# Patient Record
Sex: Male | Born: 1945 | Race: White | Hispanic: No | Marital: Married | Smoking: Former smoker
Health system: Southern US, Community
[De-identification: ages and names within clinical notes are randomized; demographics above are authoritative.]

## PROBLEM LIST (undated history)

## (undated) DIAGNOSIS — I739 Peripheral vascular disease, unspecified: Secondary | ICD-10-CM

## (undated) DIAGNOSIS — E119 Type 2 diabetes mellitus without complications: Secondary | ICD-10-CM

## (undated) DIAGNOSIS — C61 Malignant neoplasm of prostate: Secondary | ICD-10-CM

## (undated) DIAGNOSIS — I4891 Unspecified atrial fibrillation: Secondary | ICD-10-CM

## (undated) DIAGNOSIS — K1379 Other lesions of oral mucosa: Secondary | ICD-10-CM

## (undated) HISTORY — DX: Unspecified atrial fibrillation: I48.91

## (undated) HISTORY — DX: Type 2 diabetes mellitus without complications: E11.9

## (undated) HISTORY — DX: Malignant neoplasm of prostate: C61

## (undated) HISTORY — PX: CORONARY ANGIOPLASTY WITH STENT PLACEMENT: SHX49

## (undated) HISTORY — DX: Peripheral vascular disease, unspecified: I73.9

---
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
## 2003-02-21 ENCOUNTER — Encounter: Admission: RE | Admit: 2003-02-21 | Discharge: 2003-02-21 | Payer: Self-pay | Admitting: Internal Medicine

## 2003-03-01 ENCOUNTER — Encounter: Admission: RE | Admit: 2003-03-01 | Discharge: 2003-03-01 | Payer: Self-pay | Admitting: Internal Medicine

## 2004-01-27 ENCOUNTER — Emergency Department (HOSPITAL_COMMUNITY): Admission: EM | Admit: 2004-01-27 | Discharge: 2004-01-27 | Payer: Self-pay | Admitting: *Deleted

## 2010-05-12 ENCOUNTER — Encounter: Payer: Self-pay | Admitting: Internal Medicine

## 2010-07-09 ENCOUNTER — Other Ambulatory Visit: Payer: Self-pay | Admitting: Internal Medicine

## 2010-07-10 ENCOUNTER — Ambulatory Visit
Admission: RE | Admit: 2010-07-10 | Discharge: 2010-07-10 | Disposition: A | Discharge status: Home / Self Care | Payer: Medicare Other | Source: Ambulatory Visit | Attending: Internal Medicine | Admitting: Internal Medicine

## 2010-08-08 ENCOUNTER — Other Ambulatory Visit: Payer: Self-pay | Admitting: Otolaryngology

## 2010-08-08 DIAGNOSIS — E041 Nontoxic single thyroid nodule: Secondary | ICD-10-CM

## 2010-08-28 ENCOUNTER — Ambulatory Visit
Admission: RE | Admit: 2010-08-28 | Discharge: 2010-08-28 | Disposition: A | Discharge status: Home / Self Care | Payer: Medicare Other | Source: Ambulatory Visit | Attending: Otolaryngology | Admitting: Otolaryngology

## 2010-08-28 ENCOUNTER — Other Ambulatory Visit (HOSPITAL_COMMUNITY)
Admission: RE | Admit: 2010-08-28 | Discharge: 2010-08-28 | Disposition: A | Discharge status: Home / Self Care | Payer: Medicare Other | Source: Ambulatory Visit | Attending: Interventional Radiology | Admitting: Interventional Radiology

## 2010-08-28 ENCOUNTER — Other Ambulatory Visit: Payer: Self-pay | Admitting: Interventional Radiology

## 2010-08-28 DIAGNOSIS — E049 Nontoxic goiter, unspecified: Secondary | ICD-10-CM | POA: Insufficient documentation

## 2010-08-28 DIAGNOSIS — E041 Nontoxic single thyroid nodule: Secondary | ICD-10-CM

## 2010-09-26 ENCOUNTER — Ambulatory Visit (HOSPITAL_COMMUNITY)
Admission: RE | Admit: 2010-09-26 | Discharge: 2010-09-26 | Disposition: A | Discharge status: Home / Self Care | Payer: Medicare Other | Source: Ambulatory Visit | Attending: Otolaryngology | Admitting: Otolaryngology

## 2010-09-26 ENCOUNTER — Encounter (HOSPITAL_COMMUNITY)
Admission: RE | Admit: 2010-09-26 | Discharge: 2010-09-26 | Disposition: A | Discharge status: Home / Self Care | Payer: Medicare Other | Source: Ambulatory Visit | Attending: Otolaryngology | Admitting: Otolaryngology

## 2010-09-26 ENCOUNTER — Other Ambulatory Visit (HOSPITAL_COMMUNITY): Payer: Self-pay | Admitting: Otolaryngology

## 2010-09-26 DIAGNOSIS — Z01812 Encounter for preprocedural laboratory examination: Secondary | ICD-10-CM | POA: Insufficient documentation

## 2010-09-26 DIAGNOSIS — Z01810 Encounter for preprocedural cardiovascular examination: Secondary | ICD-10-CM | POA: Insufficient documentation

## 2010-09-26 DIAGNOSIS — E049 Nontoxic goiter, unspecified: Secondary | ICD-10-CM | POA: Insufficient documentation

## 2010-09-26 DIAGNOSIS — I10 Essential (primary) hypertension: Secondary | ICD-10-CM | POA: Insufficient documentation

## 2010-09-26 DIAGNOSIS — E041 Nontoxic single thyroid nodule: Secondary | ICD-10-CM

## 2010-09-26 DIAGNOSIS — Z01818 Encounter for other preprocedural examination: Secondary | ICD-10-CM | POA: Insufficient documentation

## 2010-09-26 LAB — CBC
HCT: 43 % (ref 39.0–52.0)
Hemoglobin: 14.5 g/dL (ref 13.0–17.0)
MCH: 29.9 pg (ref 26.0–34.0)
MCHC: 33.7 g/dL (ref 30.0–36.0)
MCV: 88.7 fL (ref 78.0–100.0)
Platelets: 382 10*3/uL (ref 150–400)
RBC: 4.85 MIL/uL (ref 4.22–5.81)
RDW: 12.8 % (ref 11.5–15.5)
WBC: 7.5 10*3/uL (ref 4.0–10.5)

## 2010-09-26 LAB — BASIC METABOLIC PANEL
BUN: 34 mg/dL — ABNORMAL HIGH (ref 6–23)
CO2: 33 mEq/L — ABNORMAL HIGH (ref 19–32)
Calcium: 9.9 mg/dL (ref 8.4–10.5)
Chloride: 104 mEq/L (ref 96–112)
Creatinine, Ser: 0.92 mg/dL (ref 0.4–1.5)
GFR calc Af Amer: >60 mL/min (ref >60)
GFR calc non Af Amer: >60 mL/min (ref >60)
Glucose, Bld: 102 mg/dL — ABNORMAL HIGH (ref 70–99)
Potassium: 5.3 mEq/L — ABNORMAL HIGH (ref 3.5–5.1)
Sodium: 145 mEq/L (ref 135–145)

## 2010-09-26 LAB — SURGICAL PCR SCREEN
MRSA, PCR: NEGATIVE
Staphylococcus aureus: NEGATIVE

## 2010-09-27 ENCOUNTER — Other Ambulatory Visit (HOSPITAL_COMMUNITY): Payer: Medicare Other

## 2010-09-28 ENCOUNTER — Ambulatory Visit (HOSPITAL_COMMUNITY)
Admission: RE | Admit: 2010-09-28 | Discharge: 2010-09-29 | Disposition: A | Discharge status: Home / Self Care | Payer: Medicare Other | Source: Ambulatory Visit | Attending: Otolaryngology | Admitting: Otolaryngology

## 2010-09-28 ENCOUNTER — Other Ambulatory Visit: Payer: Self-pay | Admitting: Otolaryngology

## 2010-09-28 DIAGNOSIS — Z01818 Encounter for other preprocedural examination: Secondary | ICD-10-CM | POA: Insufficient documentation

## 2010-09-28 DIAGNOSIS — E669 Obesity, unspecified: Secondary | ICD-10-CM | POA: Insufficient documentation

## 2010-09-28 DIAGNOSIS — Z01812 Encounter for preprocedural laboratory examination: Secondary | ICD-10-CM | POA: Insufficient documentation

## 2010-09-28 DIAGNOSIS — Z01810 Encounter for preprocedural cardiovascular examination: Secondary | ICD-10-CM | POA: Insufficient documentation

## 2010-09-28 DIAGNOSIS — D34 Benign neoplasm of thyroid gland: Secondary | ICD-10-CM | POA: Insufficient documentation

## 2010-09-28 DIAGNOSIS — I10 Essential (primary) hypertension: Secondary | ICD-10-CM | POA: Insufficient documentation

## 2010-09-28 DIAGNOSIS — G4733 Obstructive sleep apnea (adult) (pediatric): Secondary | ICD-10-CM | POA: Insufficient documentation

## 2010-09-28 DIAGNOSIS — K219 Gastro-esophageal reflux disease without esophagitis: Secondary | ICD-10-CM | POA: Insufficient documentation

## 2010-11-13 NOTE — Op Note (Signed)
NAMEGIL, INGWERSEN                ACCOUNT NO.:  [id number]  MEDICAL RECORD NO.:  [id number]  LOCATION:  5150                         FACILITY:  [facility]  PHYSICIAN:  Kinnie Scales. Annalee Genta, M.D.DATE OF BIRTH:  29-Oct-1945  DATE OF PROCEDURE:  09/28/2010 DATE OF DISCHARGE:                              OPERATIVE REPORT   LOCATION:  [facility] Main OR.  PREOPERATIVE DIAGNOSIS:  Left thyroid mass.  POSTOPERATIVE DIAGNOSIS:  Left thyroid mass.  INDICATION FOR SURGERY:  Left thyroid mass.  SURGICAL PROCEDURES:  Left hemithyroidectomy.  SURGEON:  Kinnie Scales. Annalee Genta, MD  No complications.  ANESTHESIA:  General endotracheal.  Minimal blood loss.  The patient was transferred from the operating room to the recovery room in stable condition.  BRIEF HISTORY:  The patient is a 65 year old white male referred to our office with a history of an incidental finding of a left inferior thyroid mass.  The patient has a family history of thyroid cancer. 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  Given the history, examination findings, and the patient's concern, we discussed various treatment options including observation and repeat biopsies overtime versus possible hemithyroidectomy.  The patient opted to undergo surgical intervention and was scheduled for left hemithyroidectomy with possible total based on frozen section analysis.  The risks, benefits, and possible complications of the surgical procedure were discussed in detail with the patient and his family.  They understood and concurred to our plan for surgery which is scheduled under general anesthesia as an outpatient.  PROCEDURE:  The patient was brought to the operating room at [facility] OR on September 28, 2010, placed in supine position on the operating table.  General endotracheal anesthesia was established without difficulty and the patient was adequately  anesthetized.  He was positioned on the operating table, prepped and draped in a sterile fashion.  The patient's skin was then injected with 1% lidocaine 1:100,000 dilution epinephrine, a total of 3 mL were injected in the proposed skin incision in the anterior neck.  He was then prepped and positioned on the operating table and the surgical procedure was begun. A 4-cm horizontally oriented skin incision was created in the anterior neck skin, it was carried through the skin and underlying subcutaneous tissue, platysma.  Subplatysmal flaps were elevated superiorly and inferiorly.  Strap muscles were identified in the midline, divided, and lateralized allowing access to the anterior compartment of the neck. The thyroid gland was carefully palpated.  On the left-hand side, there was a diffuse nodular area in the inferior aspect of left thyroid lobe. The right side appeared smooth without palpable nodule.  The strap muscles were lateralized.  The fascia of the thyroid gland was dissected.  The middle thyroid vein was identified, divided, and suture ligated.  Dissection was then carried along the inferior aspect of the gland which was carefully elevated dividing the inferior vascular supply and preserving the inferior parathyroid gland.  Blood vessels were divided using both Harmonic scalpel and suture ligature.  The thyroid isthmus was then divided along the anterior aspect of the trachea.  The thyrotracheal ligament was then carefully dissected.  The recurrent laryngeal  nerve was identified on entering the cricothyroid region and was preserved throughout.  Small blood vessels were cauterized using the Harmonic scalpel and bipolar cautery along the course of the recurrent laryngeal nerve.  Superior pole of the left thyroid lobe was then carefully dissected.  Strap muscles were elevated laterally and the superior vessels were divided and suture ligated with 2-0 silk suture. The gland was then  removed completely and sent to pathology for frozen section analysis.  Frozen section showed follicular neoplasm.  No evidence of malignancy.  Final diagnosis deferred to final path report. The patient's wound was then thoroughly irrigated.  There was no bleeding.  A 7-French round drain was then placed to the depth of the incision and sutured to the skin with a 3-0 Ethilon suture.  The incision was then closed in multiple layers with reapproximation of the strap muscles consisting of 3-0 Vicryl suture in an interrupted fashion. Deep subcutaneous and platysma level were closed with 4-0 Vicryl suture and immediate subcutaneous tissue was closed with 4-0 Vicryl in an interrupted fashion.  Final wound edge was closed with surgical glue. The patient was then awakened from his anesthetic, he was extubated and was transferred from the operating room to the recovery room in stable condition.  No complications.  Blood loss minimal.          ______________________________ Kinnie Scales. Annalee Genta, M.D.     DLS/MEDQ  D:  11/91/4782  T:  09/29/2010  Job:  [id number]  Electronically Signed by Osborn Coho M.D. on 11/13/2010 12:49:25 PM

## 2011-05-03 DIAGNOSIS — J029 Acute pharyngitis, unspecified: Secondary | ICD-10-CM | POA: Diagnosis not present

## 2011-05-03 DIAGNOSIS — Z79899 Other long term (current) drug therapy: Secondary | ICD-10-CM | POA: Diagnosis not present

## 2011-05-03 DIAGNOSIS — Z8546 Personal history of malignant neoplasm of prostate: Secondary | ICD-10-CM | POA: Diagnosis not present

## 2011-05-03 DIAGNOSIS — R3915 Urgency of urination: Secondary | ICD-10-CM | POA: Diagnosis not present

## 2011-05-14 DIAGNOSIS — N318 Other neuromuscular dysfunction of bladder: Secondary | ICD-10-CM | POA: Diagnosis not present

## 2011-05-14 DIAGNOSIS — C61 Malignant neoplasm of prostate: Secondary | ICD-10-CM | POA: Diagnosis not present

## 2011-07-02 DIAGNOSIS — N318 Other neuromuscular dysfunction of bladder: Secondary | ICD-10-CM | POA: Diagnosis not present

## 2011-07-02 DIAGNOSIS — N529 Male erectile dysfunction, unspecified: Secondary | ICD-10-CM | POA: Diagnosis not present

## 2011-07-02 DIAGNOSIS — Z8546 Personal history of malignant neoplasm of prostate: Secondary | ICD-10-CM | POA: Diagnosis not present

## 2011-07-26 DIAGNOSIS — I10 Essential (primary) hypertension: Secondary | ICD-10-CM | POA: Diagnosis not present

## 2011-07-26 DIAGNOSIS — K5900 Constipation, unspecified: Secondary | ICD-10-CM | POA: Diagnosis not present

## 2011-07-26 DIAGNOSIS — Z79899 Other long term (current) drug therapy: Secondary | ICD-10-CM | POA: Diagnosis not present

## 2011-07-26 DIAGNOSIS — M2550 Pain in unspecified joint: Secondary | ICD-10-CM | POA: Diagnosis not present

## 2011-07-26 DIAGNOSIS — R21 Rash and other nonspecific skin eruption: Secondary | ICD-10-CM | POA: Diagnosis not present

## 2011-09-13 DIAGNOSIS — H40059 Ocular hypertension, unspecified eye: Secondary | ICD-10-CM | POA: Diagnosis not present

## 2011-09-13 DIAGNOSIS — H2510 Age-related nuclear cataract, unspecified eye: Secondary | ICD-10-CM | POA: Diagnosis not present

## 2011-10-18 DIAGNOSIS — M25549 Pain in joints of unspecified hand: Secondary | ICD-10-CM | POA: Diagnosis not present

## 2011-10-18 DIAGNOSIS — E559 Vitamin D deficiency, unspecified: Secondary | ICD-10-CM | POA: Diagnosis not present

## 2011-10-18 DIAGNOSIS — R5381 Other malaise: Secondary | ICD-10-CM | POA: Diagnosis not present

## 2011-10-18 DIAGNOSIS — R7612 Nonspecific reaction to cell mediated immunity measurement of gamma interferon antigen response without active tuberculosis: Secondary | ICD-10-CM | POA: Diagnosis not present

## 2011-10-18 DIAGNOSIS — M2550 Pain in unspecified joint: Secondary | ICD-10-CM | POA: Diagnosis not present

## 2011-10-18 DIAGNOSIS — M542 Cervicalgia: Secondary | ICD-10-CM | POA: Diagnosis not present

## 2011-10-18 DIAGNOSIS — Z79899 Other long term (current) drug therapy: Secondary | ICD-10-CM | POA: Diagnosis not present

## 2011-10-18 DIAGNOSIS — M25579 Pain in unspecified ankle and joints of unspecified foot: Secondary | ICD-10-CM | POA: Diagnosis not present

## 2011-10-18 DIAGNOSIS — M545 Low back pain: Secondary | ICD-10-CM | POA: Diagnosis not present

## 2011-10-18 DIAGNOSIS — R5383 Other fatigue: Secondary | ICD-10-CM | POA: Diagnosis not present

## 2011-10-21 DIAGNOSIS — K099 Cyst of oral region, unspecified: Secondary | ICD-10-CM | POA: Diagnosis not present

## 2011-11-06 DIAGNOSIS — M25549 Pain in joints of unspecified hand: Secondary | ICD-10-CM | POA: Diagnosis not present

## 2011-11-06 DIAGNOSIS — M25579 Pain in unspecified ankle and joints of unspecified foot: Secondary | ICD-10-CM | POA: Diagnosis not present

## 2011-11-06 DIAGNOSIS — M545 Low back pain: Secondary | ICD-10-CM | POA: Diagnosis not present

## 2011-11-06 DIAGNOSIS — M542 Cervicalgia: Secondary | ICD-10-CM | POA: Diagnosis not present

## 2011-11-26 DIAGNOSIS — R972 Elevated prostate specific antigen [PSA]: Secondary | ICD-10-CM | POA: Diagnosis not present

## 2011-12-03 DIAGNOSIS — J159 Unspecified bacterial pneumonia: Secondary | ICD-10-CM | POA: Diagnosis not present

## 2011-12-03 DIAGNOSIS — N393 Stress incontinence (female) (male): Secondary | ICD-10-CM | POA: Diagnosis not present

## 2011-12-03 DIAGNOSIS — Z8546 Personal history of malignant neoplasm of prostate: Secondary | ICD-10-CM | POA: Diagnosis not present

## 2011-12-03 DIAGNOSIS — R059 Cough, unspecified: Secondary | ICD-10-CM | POA: Diagnosis not present

## 2011-12-03 DIAGNOSIS — R918 Other nonspecific abnormal finding of lung field: Secondary | ICD-10-CM | POA: Diagnosis not present

## 2011-12-03 DIAGNOSIS — R05 Cough: Secondary | ICD-10-CM | POA: Diagnosis not present

## 2011-12-03 DIAGNOSIS — R0602 Shortness of breath: Secondary | ICD-10-CM | POA: Diagnosis not present

## 2011-12-18 DIAGNOSIS — N318 Other neuromuscular dysfunction of bladder: Secondary | ICD-10-CM | POA: Diagnosis not present

## 2011-12-18 DIAGNOSIS — N393 Stress incontinence (female) (male): Secondary | ICD-10-CM | POA: Diagnosis not present

## 2011-12-18 DIAGNOSIS — E119 Type 2 diabetes mellitus without complications: Secondary | ICD-10-CM | POA: Diagnosis not present

## 2011-12-18 DIAGNOSIS — R81 Glycosuria: Secondary | ICD-10-CM | POA: Diagnosis not present

## 2011-12-20 DIAGNOSIS — R413 Other amnesia: Secondary | ICD-10-CM | POA: Diagnosis not present

## 2011-12-20 DIAGNOSIS — G4700 Insomnia, unspecified: Secondary | ICD-10-CM | POA: Diagnosis not present

## 2011-12-20 DIAGNOSIS — IMO0001 Reserved for inherently not codable concepts without codable children: Secondary | ICD-10-CM | POA: Diagnosis not present

## 2011-12-20 DIAGNOSIS — G40219 Localization-related (focal) (partial) symptomatic epilepsy and epileptic syndromes with complex partial seizures, intractable, without status epilepticus: Secondary | ICD-10-CM | POA: Diagnosis not present

## 2012-01-16 DIAGNOSIS — M545 Low back pain: Secondary | ICD-10-CM | POA: Diagnosis not present

## 2012-01-16 DIAGNOSIS — M542 Cervicalgia: Secondary | ICD-10-CM | POA: Diagnosis not present

## 2012-01-24 DIAGNOSIS — N393 Stress incontinence (female) (male): Secondary | ICD-10-CM | POA: Diagnosis not present

## 2012-01-24 DIAGNOSIS — I10 Essential (primary) hypertension: Secondary | ICD-10-CM | POA: Diagnosis not present

## 2012-01-24 DIAGNOSIS — E7800 Pure hypercholesterolemia, unspecified: Secondary | ICD-10-CM | POA: Diagnosis not present

## 2012-01-24 DIAGNOSIS — Z79899 Other long term (current) drug therapy: Secondary | ICD-10-CM | POA: Diagnosis not present

## 2012-02-06 DIAGNOSIS — N393 Stress incontinence (female) (male): Secondary | ICD-10-CM | POA: Diagnosis not present

## 2012-02-24 DIAGNOSIS — Z01810 Encounter for preprocedural cardiovascular examination: Secondary | ICD-10-CM | POA: Diagnosis not present

## 2012-03-04 DIAGNOSIS — R109 Unspecified abdominal pain: Secondary | ICD-10-CM | POA: Diagnosis not present

## 2012-03-04 DIAGNOSIS — G2581 Restless legs syndrome: Secondary | ICD-10-CM | POA: Diagnosis not present

## 2012-03-04 DIAGNOSIS — Z7982 Long term (current) use of aspirin: Secondary | ICD-10-CM | POA: Diagnosis not present

## 2012-03-04 DIAGNOSIS — E785 Hyperlipidemia, unspecified: Secondary | ICD-10-CM | POA: Diagnosis not present

## 2012-03-04 DIAGNOSIS — Z79899 Other long term (current) drug therapy: Secondary | ICD-10-CM | POA: Diagnosis not present

## 2012-03-04 DIAGNOSIS — R197 Diarrhea, unspecified: Secondary | ICD-10-CM | POA: Diagnosis not present

## 2012-03-04 DIAGNOSIS — E079 Disorder of thyroid, unspecified: Secondary | ICD-10-CM | POA: Diagnosis not present

## 2012-03-04 DIAGNOSIS — R110 Nausea: Secondary | ICD-10-CM | POA: Diagnosis not present

## 2012-03-04 DIAGNOSIS — M069 Rheumatoid arthritis, unspecified: Secondary | ICD-10-CM | POA: Diagnosis not present

## 2012-03-04 DIAGNOSIS — R32 Unspecified urinary incontinence: Secondary | ICD-10-CM | POA: Diagnosis not present

## 2012-03-04 DIAGNOSIS — I10 Essential (primary) hypertension: Secondary | ICD-10-CM | POA: Diagnosis not present

## 2012-03-04 DIAGNOSIS — N393 Stress incontinence (female) (male): Secondary | ICD-10-CM | POA: Diagnosis not present

## 2012-03-16 DIAGNOSIS — N393 Stress incontinence (female) (male): Secondary | ICD-10-CM | POA: Diagnosis not present

## 2012-03-16 DIAGNOSIS — N401 Enlarged prostate with lower urinary tract symptoms: Secondary | ICD-10-CM | POA: Diagnosis not present

## 2012-04-07 DIAGNOSIS — N393 Stress incontinence (female) (male): Secondary | ICD-10-CM | POA: Diagnosis not present

## 2012-06-03 DIAGNOSIS — E7800 Pure hypercholesterolemia, unspecified: Secondary | ICD-10-CM | POA: Diagnosis not present

## 2012-06-03 DIAGNOSIS — I10 Essential (primary) hypertension: Secondary | ICD-10-CM | POA: Diagnosis not present

## 2012-06-03 DIAGNOSIS — Z79899 Other long term (current) drug therapy: Secondary | ICD-10-CM | POA: Diagnosis not present

## 2012-06-03 DIAGNOSIS — R079 Chest pain, unspecified: Secondary | ICD-10-CM | POA: Diagnosis not present

## 2012-06-03 DIAGNOSIS — E559 Vitamin D deficiency, unspecified: Secondary | ICD-10-CM | POA: Diagnosis not present

## 2012-06-09 DIAGNOSIS — H2510 Age-related nuclear cataract, unspecified eye: Secondary | ICD-10-CM | POA: Diagnosis not present

## 2012-06-09 DIAGNOSIS — H40059 Ocular hypertension, unspecified eye: Secondary | ICD-10-CM | POA: Diagnosis not present

## 2012-06-12 DIAGNOSIS — L408 Other psoriasis: Secondary | ICD-10-CM | POA: Diagnosis not present

## 2012-07-02 DIAGNOSIS — R0602 Shortness of breath: Secondary | ICD-10-CM | POA: Diagnosis not present

## 2012-07-10 DIAGNOSIS — R112 Nausea with vomiting, unspecified: Secondary | ICD-10-CM | POA: Diagnosis not present

## 2012-07-10 DIAGNOSIS — E860 Dehydration: Secondary | ICD-10-CM | POA: Diagnosis not present

## 2012-07-10 DIAGNOSIS — F29 Unspecified psychosis not due to a substance or known physiological condition: Secondary | ICD-10-CM | POA: Diagnosis not present

## 2012-07-10 DIAGNOSIS — R4182 Altered mental status, unspecified: Secondary | ICD-10-CM | POA: Diagnosis not present

## 2012-07-10 DIAGNOSIS — R509 Fever, unspecified: Secondary | ICD-10-CM | POA: Diagnosis not present

## 2012-07-10 DIAGNOSIS — R197 Diarrhea, unspecified: Secondary | ICD-10-CM | POA: Diagnosis not present

## 2012-07-10 DIAGNOSIS — R079 Chest pain, unspecified: Secondary | ICD-10-CM | POA: Diagnosis not present

## 2012-07-11 DIAGNOSIS — K5289 Other specified noninfective gastroenteritis and colitis: Secondary | ICD-10-CM | POA: Diagnosis not present

## 2012-07-11 DIAGNOSIS — F29 Unspecified psychosis not due to a substance or known physiological condition: Secondary | ICD-10-CM | POA: Diagnosis not present

## 2012-07-11 DIAGNOSIS — E872 Acidosis: Secondary | ICD-10-CM | POA: Insufficient documentation

## 2012-07-11 DIAGNOSIS — E785 Hyperlipidemia, unspecified: Secondary | ICD-10-CM | POA: Diagnosis present

## 2012-07-11 DIAGNOSIS — R079 Chest pain, unspecified: Secondary | ICD-10-CM | POA: Diagnosis not present

## 2012-07-11 DIAGNOSIS — N179 Acute kidney failure, unspecified: Secondary | ICD-10-CM | POA: Diagnosis not present

## 2012-07-11 DIAGNOSIS — R4182 Altered mental status, unspecified: Secondary | ICD-10-CM | POA: Insufficient documentation

## 2012-07-11 DIAGNOSIS — M129 Arthropathy, unspecified: Secondary | ICD-10-CM | POA: Diagnosis present

## 2012-07-11 DIAGNOSIS — E860 Dehydration: Secondary | ICD-10-CM | POA: Insufficient documentation

## 2012-07-11 DIAGNOSIS — I10 Essential (primary) hypertension: Secondary | ICD-10-CM | POA: Diagnosis present

## 2012-07-11 DIAGNOSIS — G9341 Metabolic encephalopathy: Secondary | ICD-10-CM | POA: Diagnosis not present

## 2012-07-11 DIAGNOSIS — R112 Nausea with vomiting, unspecified: Secondary | ICD-10-CM | POA: Diagnosis not present

## 2012-07-11 DIAGNOSIS — E039 Hypothyroidism, unspecified: Secondary | ICD-10-CM | POA: Diagnosis not present

## 2012-07-11 DIAGNOSIS — K529 Noninfective gastroenteritis and colitis, unspecified: Secondary | ICD-10-CM | POA: Insufficient documentation

## 2012-07-11 DIAGNOSIS — R509 Fever, unspecified: Secondary | ICD-10-CM | POA: Diagnosis not present

## 2012-07-11 DIAGNOSIS — F0280 Dementia in other diseases classified elsewhere without behavioral disturbance: Secondary | ICD-10-CM | POA: Diagnosis present

## 2012-07-11 DIAGNOSIS — F0390 Unspecified dementia without behavioral disturbance: Secondary | ICD-10-CM | POA: Diagnosis present

## 2012-07-11 DIAGNOSIS — R197 Diarrhea, unspecified: Secondary | ICD-10-CM | POA: Diagnosis not present

## 2012-07-14 DIAGNOSIS — R110 Nausea: Secondary | ICD-10-CM | POA: Diagnosis not present

## 2012-07-14 DIAGNOSIS — E7800 Pure hypercholesterolemia, unspecified: Secondary | ICD-10-CM | POA: Diagnosis not present

## 2012-07-14 DIAGNOSIS — I951 Orthostatic hypotension: Secondary | ICD-10-CM | POA: Diagnosis not present

## 2012-07-14 DIAGNOSIS — Z79899 Other long term (current) drug therapy: Secondary | ICD-10-CM | POA: Diagnosis not present

## 2012-07-14 DIAGNOSIS — G2581 Restless legs syndrome: Secondary | ICD-10-CM | POA: Diagnosis not present

## 2012-07-14 DIAGNOSIS — E559 Vitamin D deficiency, unspecified: Secondary | ICD-10-CM | POA: Diagnosis not present

## 2012-07-21 DIAGNOSIS — R0602 Shortness of breath: Secondary | ICD-10-CM | POA: Diagnosis not present

## 2012-07-22 DIAGNOSIS — Z0000 Encounter for general adult medical examination without abnormal findings: Secondary | ICD-10-CM | POA: Diagnosis not present

## 2012-07-22 DIAGNOSIS — E559 Vitamin D deficiency, unspecified: Secondary | ICD-10-CM | POA: Diagnosis not present

## 2012-07-22 DIAGNOSIS — E7800 Pure hypercholesterolemia, unspecified: Secondary | ICD-10-CM | POA: Diagnosis not present

## 2012-07-22 DIAGNOSIS — E669 Obesity, unspecified: Secondary | ICD-10-CM | POA: Diagnosis not present

## 2012-07-22 DIAGNOSIS — Z79899 Other long term (current) drug therapy: Secondary | ICD-10-CM | POA: Diagnosis not present

## 2012-07-22 DIAGNOSIS — I10 Essential (primary) hypertension: Secondary | ICD-10-CM | POA: Diagnosis not present

## 2012-07-22 DIAGNOSIS — R351 Nocturia: Secondary | ICD-10-CM | POA: Diagnosis not present

## 2012-07-27 DIAGNOSIS — I10 Essential (primary) hypertension: Secondary | ICD-10-CM | POA: Diagnosis not present

## 2012-07-27 DIAGNOSIS — E559 Vitamin D deficiency, unspecified: Secondary | ICD-10-CM | POA: Diagnosis not present

## 2012-07-27 DIAGNOSIS — R1032 Left lower quadrant pain: Secondary | ICD-10-CM | POA: Diagnosis not present

## 2012-07-27 DIAGNOSIS — E7800 Pure hypercholesterolemia, unspecified: Secondary | ICD-10-CM | POA: Diagnosis not present

## 2012-07-27 DIAGNOSIS — R109 Unspecified abdominal pain: Secondary | ICD-10-CM | POA: Diagnosis not present

## 2012-07-28 DIAGNOSIS — R1032 Left lower quadrant pain: Secondary | ICD-10-CM | POA: Diagnosis not present

## 2012-07-28 DIAGNOSIS — R1011 Right upper quadrant pain: Secondary | ICD-10-CM | POA: Diagnosis not present

## 2012-08-04 ENCOUNTER — Other Ambulatory Visit: Payer: Self-pay | Admitting: Gastroenterology

## 2012-08-04 DIAGNOSIS — K5900 Constipation, unspecified: Secondary | ICD-10-CM | POA: Diagnosis not present

## 2012-08-04 DIAGNOSIS — R1032 Left lower quadrant pain: Secondary | ICD-10-CM | POA: Diagnosis not present

## 2012-08-10 DIAGNOSIS — R1032 Left lower quadrant pain: Secondary | ICD-10-CM | POA: Diagnosis not present

## 2012-08-10 DIAGNOSIS — I10 Essential (primary) hypertension: Secondary | ICD-10-CM | POA: Diagnosis not present

## 2012-08-14 ENCOUNTER — Ambulatory Visit
Admission: RE | Admit: 2012-08-14 | Discharge: 2012-08-14 | Disposition: A | Discharge status: Home / Self Care | Payer: Medicare Other | Source: Ambulatory Visit | Attending: Gastroenterology | Admitting: Gastroenterology

## 2012-08-14 DIAGNOSIS — L03319 Cellulitis of trunk, unspecified: Secondary | ICD-10-CM | POA: Diagnosis not present

## 2012-08-14 DIAGNOSIS — R1032 Left lower quadrant pain: Secondary | ICD-10-CM

## 2012-08-14 MED ORDER — IOHEXOL 300 MG/ML  SOLN
125.0000 mL | Freq: Once | INTRAMUSCULAR | Status: AC | PRN
  Administered 2012-08-14: 125 mL via INTRAVENOUS

## 2012-08-24 DIAGNOSIS — R0602 Shortness of breath: Secondary | ICD-10-CM | POA: Diagnosis not present

## 2012-08-24 DIAGNOSIS — I10 Essential (primary) hypertension: Secondary | ICD-10-CM | POA: Diagnosis not present

## 2012-09-02 DIAGNOSIS — IMO0001 Reserved for inherently not codable concepts without codable children: Secondary | ICD-10-CM | POA: Diagnosis not present

## 2012-09-02 DIAGNOSIS — M545 Low back pain: Secondary | ICD-10-CM | POA: Diagnosis not present

## 2012-09-02 DIAGNOSIS — Z6832 Body mass index (BMI) 32.0-32.9, adult: Secondary | ICD-10-CM | POA: Diagnosis not present

## 2012-09-02 DIAGNOSIS — E669 Obesity, unspecified: Secondary | ICD-10-CM | POA: Diagnosis not present

## 2012-09-02 DIAGNOSIS — M25559 Pain in unspecified hip: Secondary | ICD-10-CM | POA: Diagnosis not present

## 2012-09-04 DIAGNOSIS — I10 Essential (primary) hypertension: Secondary | ICD-10-CM | POA: Diagnosis not present

## 2012-09-04 DIAGNOSIS — E7800 Pure hypercholesterolemia, unspecified: Secondary | ICD-10-CM | POA: Diagnosis not present

## 2012-09-04 DIAGNOSIS — R0602 Shortness of breath: Secondary | ICD-10-CM | POA: Diagnosis not present

## 2012-09-16 DIAGNOSIS — R252 Cramp and spasm: Secondary | ICD-10-CM | POA: Diagnosis not present

## 2012-09-16 DIAGNOSIS — Z79899 Other long term (current) drug therapy: Secondary | ICD-10-CM | POA: Diagnosis not present

## 2012-09-16 DIAGNOSIS — I10 Essential (primary) hypertension: Secondary | ICD-10-CM | POA: Diagnosis not present

## 2012-12-14 DIAGNOSIS — G2581 Restless legs syndrome: Secondary | ICD-10-CM | POA: Insufficient documentation

## 2012-12-14 DIAGNOSIS — G4700 Insomnia, unspecified: Secondary | ICD-10-CM | POA: Diagnosis not present

## 2012-12-14 DIAGNOSIS — G4710 Hypersomnia, unspecified: Secondary | ICD-10-CM | POA: Diagnosis not present

## 2012-12-14 DIAGNOSIS — G4733 Obstructive sleep apnea (adult) (pediatric): Secondary | ICD-10-CM | POA: Insufficient documentation

## 2012-12-14 DIAGNOSIS — R413 Other amnesia: Secondary | ICD-10-CM | POA: Diagnosis not present

## 2012-12-14 DIAGNOSIS — G40219 Localization-related (focal) (partial) symptomatic epilepsy and epileptic syndromes with complex partial seizures, intractable, without status epilepticus: Secondary | ICD-10-CM | POA: Diagnosis not present

## 2012-12-14 DIAGNOSIS — IMO0001 Reserved for inherently not codable concepts without codable children: Secondary | ICD-10-CM | POA: Insufficient documentation

## 2012-12-14 DIAGNOSIS — G44209 Tension-type headache, unspecified, not intractable: Secondary | ICD-10-CM | POA: Insufficient documentation

## 2012-12-14 DIAGNOSIS — G4482 Headache associated with sexual activity: Secondary | ICD-10-CM | POA: Insufficient documentation

## 2012-12-18 DIAGNOSIS — E7800 Pure hypercholesterolemia, unspecified: Secondary | ICD-10-CM | POA: Diagnosis not present

## 2012-12-18 DIAGNOSIS — I10 Essential (primary) hypertension: Secondary | ICD-10-CM | POA: Diagnosis not present

## 2012-12-18 DIAGNOSIS — R42 Dizziness and giddiness: Secondary | ICD-10-CM | POA: Diagnosis not present

## 2012-12-29 DIAGNOSIS — I658 Occlusion and stenosis of other precerebral arteries: Secondary | ICD-10-CM | POA: Diagnosis not present

## 2012-12-29 DIAGNOSIS — R42 Dizziness and giddiness: Secondary | ICD-10-CM | POA: Diagnosis not present

## 2012-12-31 DIAGNOSIS — R7309 Other abnormal glucose: Secondary | ICD-10-CM | POA: Diagnosis not present

## 2012-12-31 DIAGNOSIS — I10 Essential (primary) hypertension: Secondary | ICD-10-CM | POA: Diagnosis not present

## 2012-12-31 DIAGNOSIS — E7800 Pure hypercholesterolemia, unspecified: Secondary | ICD-10-CM | POA: Diagnosis not present

## 2012-12-31 DIAGNOSIS — R0602 Shortness of breath: Secondary | ICD-10-CM | POA: Diagnosis not present

## 2012-12-31 DIAGNOSIS — I6529 Occlusion and stenosis of unspecified carotid artery: Secondary | ICD-10-CM | POA: Diagnosis not present

## 2012-12-31 DIAGNOSIS — I658 Occlusion and stenosis of other precerebral arteries: Secondary | ICD-10-CM | POA: Diagnosis not present

## 2013-01-05 DIAGNOSIS — M5137 Other intervertebral disc degeneration, lumbosacral region: Secondary | ICD-10-CM | POA: Diagnosis not present

## 2013-01-05 DIAGNOSIS — M5030 Other cervical disc degeneration, unspecified cervical region: Secondary | ICD-10-CM | POA: Diagnosis not present

## 2013-01-05 DIAGNOSIS — L408 Other psoriasis: Secondary | ICD-10-CM | POA: Diagnosis not present

## 2013-01-05 DIAGNOSIS — M19049 Primary osteoarthritis, unspecified hand: Secondary | ICD-10-CM | POA: Diagnosis not present

## 2013-01-19 DIAGNOSIS — R0602 Shortness of breath: Secondary | ICD-10-CM | POA: Diagnosis not present

## 2013-01-19 DIAGNOSIS — I10 Essential (primary) hypertension: Secondary | ICD-10-CM | POA: Diagnosis not present

## 2013-04-13 DIAGNOSIS — E079 Disorder of thyroid, unspecified: Secondary | ICD-10-CM | POA: Diagnosis not present

## 2013-04-13 DIAGNOSIS — G2581 Restless legs syndrome: Secondary | ICD-10-CM | POA: Diagnosis not present

## 2013-04-13 DIAGNOSIS — I4891 Unspecified atrial fibrillation: Secondary | ICD-10-CM | POA: Diagnosis not present

## 2013-04-13 DIAGNOSIS — I10 Essential (primary) hypertension: Secondary | ICD-10-CM | POA: Diagnosis not present

## 2013-04-13 DIAGNOSIS — E785 Hyperlipidemia, unspecified: Secondary | ICD-10-CM | POA: Diagnosis not present

## 2013-04-13 DIAGNOSIS — Z79899 Other long term (current) drug therapy: Secondary | ICD-10-CM | POA: Diagnosis not present

## 2013-04-13 DIAGNOSIS — J209 Acute bronchitis, unspecified: Secondary | ICD-10-CM | POA: Diagnosis not present

## 2013-04-13 DIAGNOSIS — Z7982 Long term (current) use of aspirin: Secondary | ICD-10-CM | POA: Diagnosis not present

## 2013-04-13 DIAGNOSIS — R0602 Shortness of breath: Secondary | ICD-10-CM | POA: Diagnosis not present

## 2013-04-13 DIAGNOSIS — J40 Bronchitis, not specified as acute or chronic: Secondary | ICD-10-CM | POA: Diagnosis not present

## 2013-04-19 DIAGNOSIS — J189 Pneumonia, unspecified organism: Secondary | ICD-10-CM | POA: Diagnosis not present

## 2013-04-19 DIAGNOSIS — G2581 Restless legs syndrome: Secondary | ICD-10-CM | POA: Diagnosis not present

## 2013-04-19 DIAGNOSIS — J069 Acute upper respiratory infection, unspecified: Secondary | ICD-10-CM | POA: Diagnosis not present

## 2013-04-19 DIAGNOSIS — I4892 Unspecified atrial flutter: Secondary | ICD-10-CM | POA: Diagnosis not present

## 2013-04-19 DIAGNOSIS — R079 Chest pain, unspecified: Secondary | ICD-10-CM | POA: Diagnosis not present

## 2013-04-19 DIAGNOSIS — N179 Acute kidney failure, unspecified: Secondary | ICD-10-CM | POA: Diagnosis not present

## 2013-04-19 DIAGNOSIS — R918 Other nonspecific abnormal finding of lung field: Secondary | ICD-10-CM | POA: Diagnosis not present

## 2013-04-19 DIAGNOSIS — E860 Dehydration: Secondary | ICD-10-CM | POA: Diagnosis not present

## 2013-04-19 DIAGNOSIS — I2584 Coronary atherosclerosis due to calcified coronary lesion: Secondary | ICD-10-CM | POA: Diagnosis not present

## 2013-04-20 DIAGNOSIS — I10 Essential (primary) hypertension: Secondary | ICD-10-CM | POA: Diagnosis not present

## 2013-04-20 DIAGNOSIS — N179 Acute kidney failure, unspecified: Secondary | ICD-10-CM | POA: Diagnosis not present

## 2013-04-20 DIAGNOSIS — Z87891 Personal history of nicotine dependence: Secondary | ICD-10-CM | POA: Diagnosis not present

## 2013-04-20 DIAGNOSIS — J189 Pneumonia, unspecified organism: Secondary | ICD-10-CM | POA: Diagnosis present

## 2013-04-20 DIAGNOSIS — I4892 Unspecified atrial flutter: Secondary | ICD-10-CM | POA: Diagnosis not present

## 2013-04-20 DIAGNOSIS — R911 Solitary pulmonary nodule: Secondary | ICD-10-CM | POA: Diagnosis not present

## 2013-04-20 DIAGNOSIS — R599 Enlarged lymph nodes, unspecified: Secondary | ICD-10-CM | POA: Diagnosis present

## 2013-04-20 DIAGNOSIS — Z8546 Personal history of malignant neoplasm of prostate: Secondary | ICD-10-CM | POA: Diagnosis not present

## 2013-04-20 DIAGNOSIS — I517 Cardiomegaly: Secondary | ICD-10-CM | POA: Diagnosis not present

## 2013-04-20 DIAGNOSIS — Z7901 Long term (current) use of anticoagulants: Secondary | ICD-10-CM | POA: Diagnosis not present

## 2013-04-20 DIAGNOSIS — E860 Dehydration: Secondary | ICD-10-CM | POA: Diagnosis present

## 2013-04-20 DIAGNOSIS — R0602 Shortness of breath: Secondary | ICD-10-CM | POA: Diagnosis not present

## 2013-04-20 DIAGNOSIS — Z7982 Long term (current) use of aspirin: Secondary | ICD-10-CM | POA: Diagnosis not present

## 2013-04-20 DIAGNOSIS — J069 Acute upper respiratory infection, unspecified: Secondary | ICD-10-CM | POA: Diagnosis not present

## 2013-04-20 DIAGNOSIS — J40 Bronchitis, not specified as acute or chronic: Secondary | ICD-10-CM | POA: Diagnosis present

## 2013-04-20 DIAGNOSIS — J209 Acute bronchitis, unspecified: Secondary | ICD-10-CM | POA: Diagnosis not present

## 2013-04-20 DIAGNOSIS — E785 Hyperlipidemia, unspecified: Secondary | ICD-10-CM | POA: Diagnosis present

## 2013-04-20 DIAGNOSIS — G2581 Restless legs syndrome: Secondary | ICD-10-CM | POA: Diagnosis present

## 2013-04-20 DIAGNOSIS — Z2821 Immunization not carried out because of patient refusal: Secondary | ICD-10-CM | POA: Diagnosis not present

## 2013-04-20 DIAGNOSIS — I4891 Unspecified atrial fibrillation: Secondary | ICD-10-CM | POA: Diagnosis not present

## 2013-04-20 DIAGNOSIS — R7309 Other abnormal glucose: Secondary | ICD-10-CM | POA: Diagnosis present

## 2013-05-03 DIAGNOSIS — R911 Solitary pulmonary nodule: Secondary | ICD-10-CM | POA: Diagnosis not present

## 2013-05-03 DIAGNOSIS — G4710 Hypersomnia, unspecified: Secondary | ICD-10-CM | POA: Diagnosis not present

## 2013-05-03 DIAGNOSIS — J45909 Unspecified asthma, uncomplicated: Secondary | ICD-10-CM | POA: Diagnosis not present

## 2013-05-03 DIAGNOSIS — E559 Vitamin D deficiency, unspecified: Secondary | ICD-10-CM | POA: Diagnosis not present

## 2013-05-03 DIAGNOSIS — G4730 Sleep apnea, unspecified: Secondary | ICD-10-CM | POA: Diagnosis not present

## 2013-05-03 DIAGNOSIS — J3089 Other allergic rhinitis: Secondary | ICD-10-CM | POA: Diagnosis not present

## 2013-05-06 DIAGNOSIS — J40 Bronchitis, not specified as acute or chronic: Secondary | ICD-10-CM | POA: Diagnosis not present

## 2013-05-06 DIAGNOSIS — I4891 Unspecified atrial fibrillation: Secondary | ICD-10-CM | POA: Diagnosis not present

## 2013-05-06 DIAGNOSIS — R631 Polydipsia: Secondary | ICD-10-CM | POA: Diagnosis not present

## 2013-05-06 DIAGNOSIS — Z09 Encounter for follow-up examination after completed treatment for conditions other than malignant neoplasm: Secondary | ICD-10-CM | POA: Diagnosis not present

## 2013-05-06 DIAGNOSIS — R7309 Other abnormal glucose: Secondary | ICD-10-CM | POA: Diagnosis not present

## 2013-05-06 DIAGNOSIS — I10 Essential (primary) hypertension: Secondary | ICD-10-CM | POA: Diagnosis not present

## 2013-05-06 DIAGNOSIS — E669 Obesity, unspecified: Secondary | ICD-10-CM | POA: Diagnosis not present

## 2013-05-17 DIAGNOSIS — I10 Essential (primary) hypertension: Secondary | ICD-10-CM | POA: Diagnosis not present

## 2013-05-17 DIAGNOSIS — R0602 Shortness of breath: Secondary | ICD-10-CM | POA: Diagnosis not present

## 2013-05-17 DIAGNOSIS — R609 Edema, unspecified: Secondary | ICD-10-CM | POA: Diagnosis not present

## 2013-05-17 DIAGNOSIS — I4891 Unspecified atrial fibrillation: Secondary | ICD-10-CM | POA: Diagnosis not present

## 2013-06-01 DIAGNOSIS — G4710 Hypersomnia, unspecified: Secondary | ICD-10-CM | POA: Diagnosis not present

## 2013-06-01 DIAGNOSIS — G4730 Sleep apnea, unspecified: Secondary | ICD-10-CM | POA: Diagnosis not present

## 2013-06-03 DIAGNOSIS — Z87898 Personal history of other specified conditions: Secondary | ICD-10-CM | POA: Diagnosis not present

## 2013-06-03 DIAGNOSIS — N318 Other neuromuscular dysfunction of bladder: Secondary | ICD-10-CM | POA: Diagnosis not present

## 2013-06-03 DIAGNOSIS — N401 Enlarged prostate with lower urinary tract symptoms: Secondary | ICD-10-CM | POA: Diagnosis not present

## 2013-06-03 DIAGNOSIS — R109 Unspecified abdominal pain: Secondary | ICD-10-CM | POA: Diagnosis not present

## 2013-06-03 DIAGNOSIS — N529 Male erectile dysfunction, unspecified: Secondary | ICD-10-CM | POA: Diagnosis not present

## 2013-06-03 DIAGNOSIS — Z8546 Personal history of malignant neoplasm of prostate: Secondary | ICD-10-CM | POA: Diagnosis not present

## 2013-06-07 DIAGNOSIS — G4710 Hypersomnia, unspecified: Secondary | ICD-10-CM | POA: Diagnosis not present

## 2013-06-07 DIAGNOSIS — J3089 Other allergic rhinitis: Secondary | ICD-10-CM | POA: Diagnosis not present

## 2013-06-07 DIAGNOSIS — R5381 Other malaise: Secondary | ICD-10-CM | POA: Diagnosis not present

## 2013-06-07 DIAGNOSIS — J45909 Unspecified asthma, uncomplicated: Secondary | ICD-10-CM | POA: Diagnosis not present

## 2013-06-07 DIAGNOSIS — R5383 Other fatigue: Secondary | ICD-10-CM | POA: Diagnosis not present

## 2013-06-07 DIAGNOSIS — G4730 Sleep apnea, unspecified: Secondary | ICD-10-CM | POA: Diagnosis not present

## 2013-06-14 DIAGNOSIS — I4891 Unspecified atrial fibrillation: Secondary | ICD-10-CM | POA: Diagnosis not present

## 2013-06-14 DIAGNOSIS — I10 Essential (primary) hypertension: Secondary | ICD-10-CM | POA: Diagnosis not present

## 2013-06-14 DIAGNOSIS — R609 Edema, unspecified: Secondary | ICD-10-CM | POA: Diagnosis not present

## 2013-06-14 DIAGNOSIS — R0602 Shortness of breath: Secondary | ICD-10-CM | POA: Diagnosis not present

## 2013-06-16 DIAGNOSIS — K429 Umbilical hernia without obstruction or gangrene: Secondary | ICD-10-CM | POA: Diagnosis not present

## 2013-06-16 DIAGNOSIS — N318 Other neuromuscular dysfunction of bladder: Secondary | ICD-10-CM | POA: Diagnosis not present

## 2013-06-16 DIAGNOSIS — Z8546 Personal history of malignant neoplasm of prostate: Secondary | ICD-10-CM | POA: Diagnosis not present

## 2013-06-16 DIAGNOSIS — C61 Malignant neoplasm of prostate: Secondary | ICD-10-CM | POA: Diagnosis not present

## 2013-06-20 DIAGNOSIS — G4730 Sleep apnea, unspecified: Secondary | ICD-10-CM | POA: Diagnosis not present

## 2013-06-20 DIAGNOSIS — G4710 Hypersomnia, unspecified: Secondary | ICD-10-CM | POA: Diagnosis not present

## 2013-06-24 DIAGNOSIS — J45909 Unspecified asthma, uncomplicated: Secondary | ICD-10-CM | POA: Diagnosis not present

## 2013-06-24 DIAGNOSIS — J3089 Other allergic rhinitis: Secondary | ICD-10-CM | POA: Diagnosis not present

## 2013-06-24 DIAGNOSIS — R911 Solitary pulmonary nodule: Secondary | ICD-10-CM | POA: Diagnosis not present

## 2013-06-24 DIAGNOSIS — G4730 Sleep apnea, unspecified: Secondary | ICD-10-CM | POA: Diagnosis not present

## 2013-06-24 DIAGNOSIS — G4710 Hypersomnia, unspecified: Secondary | ICD-10-CM | POA: Diagnosis not present

## 2013-06-29 DIAGNOSIS — I10 Essential (primary) hypertension: Secondary | ICD-10-CM | POA: Diagnosis not present

## 2013-06-29 DIAGNOSIS — R0602 Shortness of breath: Secondary | ICD-10-CM | POA: Diagnosis not present

## 2013-06-29 DIAGNOSIS — I4891 Unspecified atrial fibrillation: Secondary | ICD-10-CM | POA: Diagnosis not present

## 2013-06-29 DIAGNOSIS — E785 Hyperlipidemia, unspecified: Secondary | ICD-10-CM | POA: Diagnosis not present

## 2013-07-05 DIAGNOSIS — R059 Cough, unspecified: Secondary | ICD-10-CM | POA: Diagnosis not present

## 2013-07-05 DIAGNOSIS — G4710 Hypersomnia, unspecified: Secondary | ICD-10-CM | POA: Diagnosis not present

## 2013-07-05 DIAGNOSIS — R05 Cough: Secondary | ICD-10-CM | POA: Diagnosis not present

## 2013-07-05 DIAGNOSIS — J45909 Unspecified asthma, uncomplicated: Secondary | ICD-10-CM | POA: Diagnosis not present

## 2013-07-05 DIAGNOSIS — J3089 Other allergic rhinitis: Secondary | ICD-10-CM | POA: Diagnosis not present

## 2013-07-12 DIAGNOSIS — I10 Essential (primary) hypertension: Secondary | ICD-10-CM | POA: Diagnosis not present

## 2013-07-12 DIAGNOSIS — I4891 Unspecified atrial fibrillation: Secondary | ICD-10-CM | POA: Diagnosis not present

## 2013-07-13 DIAGNOSIS — I10 Essential (primary) hypertension: Secondary | ICD-10-CM | POA: Diagnosis not present

## 2013-07-13 DIAGNOSIS — Z8249 Family history of ischemic heart disease and other diseases of the circulatory system: Secondary | ICD-10-CM | POA: Diagnosis not present

## 2013-07-13 DIAGNOSIS — G4730 Sleep apnea, unspecified: Secondary | ICD-10-CM | POA: Diagnosis not present

## 2013-07-13 DIAGNOSIS — Z87891 Personal history of nicotine dependence: Secondary | ICD-10-CM | POA: Diagnosis not present

## 2013-07-13 DIAGNOSIS — Z7982 Long term (current) use of aspirin: Secondary | ICD-10-CM | POA: Diagnosis not present

## 2013-07-13 DIAGNOSIS — E669 Obesity, unspecified: Secondary | ICD-10-CM | POA: Diagnosis not present

## 2013-07-13 DIAGNOSIS — Z79899 Other long term (current) drug therapy: Secondary | ICD-10-CM | POA: Diagnosis not present

## 2013-07-13 DIAGNOSIS — M129 Arthropathy, unspecified: Secondary | ICD-10-CM | POA: Diagnosis not present

## 2013-07-13 DIAGNOSIS — J449 Chronic obstructive pulmonary disease, unspecified: Secondary | ICD-10-CM | POA: Diagnosis not present

## 2013-07-13 DIAGNOSIS — I4891 Unspecified atrial fibrillation: Secondary | ICD-10-CM | POA: Diagnosis not present

## 2013-07-13 DIAGNOSIS — E785 Hyperlipidemia, unspecified: Secondary | ICD-10-CM | POA: Diagnosis not present

## 2013-07-20 DIAGNOSIS — R0602 Shortness of breath: Secondary | ICD-10-CM | POA: Diagnosis not present

## 2013-07-20 DIAGNOSIS — E785 Hyperlipidemia, unspecified: Secondary | ICD-10-CM | POA: Diagnosis not present

## 2013-07-20 DIAGNOSIS — I4891 Unspecified atrial fibrillation: Secondary | ICD-10-CM | POA: Diagnosis not present

## 2013-07-20 DIAGNOSIS — I10 Essential (primary) hypertension: Secondary | ICD-10-CM | POA: Diagnosis not present

## 2013-07-26 DIAGNOSIS — I4891 Unspecified atrial fibrillation: Secondary | ICD-10-CM | POA: Diagnosis not present

## 2013-07-26 DIAGNOSIS — I10 Essential (primary) hypertension: Secondary | ICD-10-CM | POA: Diagnosis not present

## 2013-07-26 DIAGNOSIS — E785 Hyperlipidemia, unspecified: Secondary | ICD-10-CM | POA: Diagnosis not present

## 2013-07-30 DIAGNOSIS — I10 Essential (primary) hypertension: Secondary | ICD-10-CM | POA: Diagnosis not present

## 2013-07-30 DIAGNOSIS — Z0000 Encounter for general adult medical examination without abnormal findings: Secondary | ICD-10-CM | POA: Diagnosis not present

## 2013-07-30 DIAGNOSIS — I4891 Unspecified atrial fibrillation: Secondary | ICD-10-CM | POA: Diagnosis not present

## 2013-07-30 DIAGNOSIS — E039 Hypothyroidism, unspecified: Secondary | ICD-10-CM | POA: Diagnosis not present

## 2013-07-30 DIAGNOSIS — R7309 Other abnormal glucose: Secondary | ICD-10-CM | POA: Diagnosis not present

## 2013-07-30 DIAGNOSIS — E559 Vitamin D deficiency, unspecified: Secondary | ICD-10-CM | POA: Diagnosis not present

## 2013-07-30 DIAGNOSIS — G4733 Obstructive sleep apnea (adult) (pediatric): Secondary | ICD-10-CM | POA: Diagnosis not present

## 2013-08-09 DIAGNOSIS — G4710 Hypersomnia, unspecified: Secondary | ICD-10-CM | POA: Diagnosis not present

## 2013-08-09 DIAGNOSIS — R05 Cough: Secondary | ICD-10-CM | POA: Diagnosis not present

## 2013-08-09 DIAGNOSIS — R5381 Other malaise: Secondary | ICD-10-CM | POA: Diagnosis not present

## 2013-08-09 DIAGNOSIS — R059 Cough, unspecified: Secondary | ICD-10-CM | POA: Diagnosis not present

## 2013-08-09 DIAGNOSIS — R5383 Other fatigue: Secondary | ICD-10-CM | POA: Diagnosis not present

## 2013-08-09 DIAGNOSIS — J45909 Unspecified asthma, uncomplicated: Secondary | ICD-10-CM | POA: Diagnosis not present

## 2013-08-16 DIAGNOSIS — I10 Essential (primary) hypertension: Secondary | ICD-10-CM | POA: Diagnosis not present

## 2013-08-16 DIAGNOSIS — R0602 Shortness of breath: Secondary | ICD-10-CM | POA: Diagnosis not present

## 2013-08-16 DIAGNOSIS — I4891 Unspecified atrial fibrillation: Secondary | ICD-10-CM | POA: Diagnosis not present

## 2013-08-16 DIAGNOSIS — E785 Hyperlipidemia, unspecified: Secondary | ICD-10-CM | POA: Diagnosis not present

## 2013-08-18 DIAGNOSIS — R0609 Other forms of dyspnea: Secondary | ICD-10-CM | POA: Diagnosis not present

## 2013-08-18 DIAGNOSIS — I4891 Unspecified atrial fibrillation: Secondary | ICD-10-CM | POA: Diagnosis not present

## 2013-08-18 DIAGNOSIS — Z7901 Long term (current) use of anticoagulants: Secondary | ICD-10-CM | POA: Diagnosis not present

## 2013-08-18 DIAGNOSIS — J449 Chronic obstructive pulmonary disease, unspecified: Secondary | ICD-10-CM | POA: Diagnosis not present

## 2013-08-18 DIAGNOSIS — I10 Essential (primary) hypertension: Secondary | ICD-10-CM | POA: Diagnosis not present

## 2013-08-18 DIAGNOSIS — I2510 Atherosclerotic heart disease of native coronary artery without angina pectoris: Secondary | ICD-10-CM | POA: Diagnosis not present

## 2013-08-18 DIAGNOSIS — E785 Hyperlipidemia, unspecified: Secondary | ICD-10-CM | POA: Diagnosis not present

## 2013-08-19 DIAGNOSIS — J449 Chronic obstructive pulmonary disease, unspecified: Secondary | ICD-10-CM | POA: Diagnosis not present

## 2013-08-19 DIAGNOSIS — I4891 Unspecified atrial fibrillation: Secondary | ICD-10-CM | POA: Diagnosis not present

## 2013-08-19 DIAGNOSIS — I10 Essential (primary) hypertension: Secondary | ICD-10-CM | POA: Diagnosis not present

## 2013-08-19 DIAGNOSIS — Z7901 Long term (current) use of anticoagulants: Secondary | ICD-10-CM | POA: Diagnosis not present

## 2013-08-19 DIAGNOSIS — Z9861 Coronary angioplasty status: Secondary | ICD-10-CM | POA: Diagnosis not present

## 2013-08-19 DIAGNOSIS — R9439 Abnormal result of other cardiovascular function study: Secondary | ICD-10-CM | POA: Diagnosis not present

## 2013-08-19 DIAGNOSIS — I2510 Atherosclerotic heart disease of native coronary artery without angina pectoris: Secondary | ICD-10-CM | POA: Diagnosis not present

## 2013-08-19 DIAGNOSIS — E785 Hyperlipidemia, unspecified: Secondary | ICD-10-CM | POA: Diagnosis not present

## 2013-09-14 DIAGNOSIS — I714 Abdominal aortic aneurysm, without rupture, unspecified: Secondary | ICD-10-CM | POA: Diagnosis not present

## 2013-09-14 DIAGNOSIS — I739 Peripheral vascular disease, unspecified: Secondary | ICD-10-CM | POA: Diagnosis not present

## 2013-09-14 DIAGNOSIS — Z0389 Encounter for observation for other suspected diseases and conditions ruled out: Secondary | ICD-10-CM | POA: Diagnosis not present

## 2013-09-14 DIAGNOSIS — M79609 Pain in unspecified limb: Secondary | ICD-10-CM | POA: Diagnosis not present

## 2013-09-15 DIAGNOSIS — Z713 Dietary counseling and surveillance: Secondary | ICD-10-CM | POA: Diagnosis not present

## 2013-09-15 DIAGNOSIS — F321 Major depressive disorder, single episode, moderate: Secondary | ICD-10-CM | POA: Diagnosis not present

## 2013-09-15 DIAGNOSIS — IMO0001 Reserved for inherently not codable concepts without codable children: Secondary | ICD-10-CM | POA: Diagnosis not present

## 2013-09-15 DIAGNOSIS — R635 Abnormal weight gain: Secondary | ICD-10-CM | POA: Diagnosis not present

## 2013-09-20 DIAGNOSIS — I10 Essential (primary) hypertension: Secondary | ICD-10-CM | POA: Diagnosis not present

## 2013-09-20 DIAGNOSIS — I428 Other cardiomyopathies: Secondary | ICD-10-CM | POA: Diagnosis not present

## 2013-09-20 DIAGNOSIS — I4891 Unspecified atrial fibrillation: Secondary | ICD-10-CM | POA: Diagnosis not present

## 2013-09-20 DIAGNOSIS — R0602 Shortness of breath: Secondary | ICD-10-CM | POA: Diagnosis not present

## 2013-10-14 DIAGNOSIS — IMO0001 Reserved for inherently not codable concepts without codable children: Secondary | ICD-10-CM | POA: Diagnosis not present

## 2013-10-14 DIAGNOSIS — Z87891 Personal history of nicotine dependence: Secondary | ICD-10-CM | POA: Diagnosis not present

## 2013-10-14 DIAGNOSIS — R413 Other amnesia: Secondary | ICD-10-CM | POA: Diagnosis not present

## 2013-10-14 DIAGNOSIS — Z6835 Body mass index (BMI) 35.0-35.9, adult: Secondary | ICD-10-CM | POA: Diagnosis not present

## 2013-10-19 DIAGNOSIS — G4710 Hypersomnia, unspecified: Secondary | ICD-10-CM | POA: Diagnosis not present

## 2013-10-19 DIAGNOSIS — G4730 Sleep apnea, unspecified: Secondary | ICD-10-CM | POA: Diagnosis not present

## 2013-10-29 DIAGNOSIS — J984 Other disorders of lung: Secondary | ICD-10-CM | POA: Diagnosis not present

## 2013-10-29 DIAGNOSIS — R918 Other nonspecific abnormal finding of lung field: Secondary | ICD-10-CM | POA: Diagnosis not present

## 2013-11-02 DIAGNOSIS — R5381 Other malaise: Secondary | ICD-10-CM | POA: Diagnosis not present

## 2013-11-02 DIAGNOSIS — G4730 Sleep apnea, unspecified: Secondary | ICD-10-CM | POA: Diagnosis not present

## 2013-11-02 DIAGNOSIS — J45909 Unspecified asthma, uncomplicated: Secondary | ICD-10-CM | POA: Diagnosis not present

## 2013-11-02 DIAGNOSIS — G4710 Hypersomnia, unspecified: Secondary | ICD-10-CM | POA: Diagnosis not present

## 2013-11-02 DIAGNOSIS — J3089 Other allergic rhinitis: Secondary | ICD-10-CM | POA: Diagnosis not present

## 2013-11-02 DIAGNOSIS — R5383 Other fatigue: Secondary | ICD-10-CM | POA: Diagnosis not present

## 2013-11-03 DIAGNOSIS — C61 Malignant neoplasm of prostate: Secondary | ICD-10-CM | POA: Insufficient documentation

## 2013-11-03 DIAGNOSIS — R413 Other amnesia: Secondary | ICD-10-CM | POA: Diagnosis not present

## 2013-11-03 DIAGNOSIS — R32 Unspecified urinary incontinence: Secondary | ICD-10-CM | POA: Insufficient documentation

## 2013-11-03 DIAGNOSIS — R351 Nocturia: Secondary | ICD-10-CM | POA: Diagnosis not present

## 2013-11-03 DIAGNOSIS — N318 Other neuromuscular dysfunction of bladder: Secondary | ICD-10-CM | POA: Diagnosis not present

## 2013-11-03 DIAGNOSIS — N393 Stress incontinence (female) (male): Secondary | ICD-10-CM | POA: Insufficient documentation

## 2013-11-03 DIAGNOSIS — N3281 Overactive bladder: Secondary | ICD-10-CM | POA: Insufficient documentation

## 2013-11-22 DIAGNOSIS — R32 Unspecified urinary incontinence: Secondary | ICD-10-CM | POA: Diagnosis not present

## 2013-11-22 DIAGNOSIS — IMO0001 Reserved for inherently not codable concepts without codable children: Secondary | ICD-10-CM | POA: Diagnosis not present

## 2013-11-22 DIAGNOSIS — I10 Essential (primary) hypertension: Secondary | ICD-10-CM | POA: Diagnosis not present

## 2013-11-22 DIAGNOSIS — Z79899 Other long term (current) drug therapy: Secondary | ICD-10-CM | POA: Diagnosis not present

## 2013-12-09 DIAGNOSIS — R413 Other amnesia: Secondary | ICD-10-CM | POA: Diagnosis not present

## 2013-12-12 DIAGNOSIS — N529 Male erectile dysfunction, unspecified: Secondary | ICD-10-CM | POA: Insufficient documentation

## 2013-12-12 DIAGNOSIS — Z8546 Personal history of malignant neoplasm of prostate: Secondary | ICD-10-CM | POA: Insufficient documentation

## 2013-12-12 DIAGNOSIS — Z86002 Personal history of in-situ neoplasm of other and unspecified genital organs: Secondary | ICD-10-CM | POA: Insufficient documentation

## 2013-12-12 DIAGNOSIS — R109 Unspecified abdominal pain: Secondary | ICD-10-CM | POA: Insufficient documentation

## 2013-12-21 DIAGNOSIS — I2510 Atherosclerotic heart disease of native coronary artery without angina pectoris: Secondary | ICD-10-CM | POA: Diagnosis not present

## 2013-12-21 DIAGNOSIS — I10 Essential (primary) hypertension: Secondary | ICD-10-CM | POA: Diagnosis not present

## 2013-12-21 DIAGNOSIS — E119 Type 2 diabetes mellitus without complications: Secondary | ICD-10-CM | POA: Diagnosis not present

## 2013-12-21 DIAGNOSIS — I428 Other cardiomyopathies: Secondary | ICD-10-CM | POA: Diagnosis not present

## 2013-12-28 DIAGNOSIS — E669 Obesity, unspecified: Secondary | ICD-10-CM | POA: Diagnosis not present

## 2013-12-28 DIAGNOSIS — E039 Hypothyroidism, unspecified: Secondary | ICD-10-CM | POA: Diagnosis not present

## 2013-12-28 DIAGNOSIS — Z713 Dietary counseling and surveillance: Secondary | ICD-10-CM | POA: Diagnosis not present

## 2013-12-28 DIAGNOSIS — Z6834 Body mass index (BMI) 34.0-34.9, adult: Secondary | ICD-10-CM | POA: Diagnosis not present

## 2013-12-30 DIAGNOSIS — R413 Other amnesia: Secondary | ICD-10-CM | POA: Diagnosis not present

## 2014-01-24 DIAGNOSIS — E1165 Type 2 diabetes mellitus with hyperglycemia: Secondary | ICD-10-CM | POA: Diagnosis not present

## 2014-01-24 DIAGNOSIS — Z713 Dietary counseling and surveillance: Secondary | ICD-10-CM | POA: Diagnosis not present

## 2014-01-26 DIAGNOSIS — Z713 Dietary counseling and surveillance: Secondary | ICD-10-CM | POA: Diagnosis not present

## 2014-01-26 DIAGNOSIS — E119 Type 2 diabetes mellitus without complications: Secondary | ICD-10-CM | POA: Diagnosis not present

## 2014-02-02 DIAGNOSIS — Z713 Dietary counseling and surveillance: Secondary | ICD-10-CM | POA: Diagnosis not present

## 2014-02-02 DIAGNOSIS — E119 Type 2 diabetes mellitus without complications: Secondary | ICD-10-CM | POA: Diagnosis not present

## 2014-02-03 DIAGNOSIS — C61 Malignant neoplasm of prostate: Secondary | ICD-10-CM | POA: Diagnosis not present

## 2014-02-03 DIAGNOSIS — Z23 Encounter for immunization: Secondary | ICD-10-CM | POA: Diagnosis not present

## 2014-02-03 DIAGNOSIS — Z6833 Body mass index (BMI) 33.0-33.9, adult: Secondary | ICD-10-CM | POA: Diagnosis not present

## 2014-02-03 DIAGNOSIS — E669 Obesity, unspecified: Secondary | ICD-10-CM | POA: Diagnosis not present

## 2014-02-03 DIAGNOSIS — I10 Essential (primary) hypertension: Secondary | ICD-10-CM | POA: Diagnosis not present

## 2014-02-07 DIAGNOSIS — G3184 Mild cognitive impairment, so stated: Secondary | ICD-10-CM | POA: Diagnosis not present

## 2014-02-09 DIAGNOSIS — E1165 Type 2 diabetes mellitus with hyperglycemia: Secondary | ICD-10-CM | POA: Diagnosis not present

## 2014-02-09 DIAGNOSIS — Z713 Dietary counseling and surveillance: Secondary | ICD-10-CM | POA: Diagnosis not present

## 2014-02-14 DIAGNOSIS — R413 Other amnesia: Secondary | ICD-10-CM | POA: Diagnosis not present

## 2014-02-14 DIAGNOSIS — G459 Transient cerebral ischemic attack, unspecified: Secondary | ICD-10-CM | POA: Diagnosis not present

## 2014-02-16 DIAGNOSIS — Z713 Dietary counseling and surveillance: Secondary | ICD-10-CM | POA: Diagnosis not present

## 2014-02-16 DIAGNOSIS — E1165 Type 2 diabetes mellitus with hyperglycemia: Secondary | ICD-10-CM | POA: Diagnosis not present

## 2014-02-17 DIAGNOSIS — M19071 Primary osteoarthritis, right ankle and foot: Secondary | ICD-10-CM | POA: Diagnosis not present

## 2014-02-17 DIAGNOSIS — M5030 Other cervical disc degeneration, unspecified cervical region: Secondary | ICD-10-CM | POA: Diagnosis not present

## 2014-02-17 DIAGNOSIS — L408 Other psoriasis: Secondary | ICD-10-CM | POA: Diagnosis not present

## 2014-02-17 DIAGNOSIS — M19041 Primary osteoarthritis, right hand: Secondary | ICD-10-CM | POA: Diagnosis not present

## 2014-02-23 DIAGNOSIS — E785 Hyperlipidemia, unspecified: Secondary | ICD-10-CM | POA: Diagnosis not present

## 2014-02-23 DIAGNOSIS — I10 Essential (primary) hypertension: Secondary | ICD-10-CM | POA: Diagnosis not present

## 2014-02-23 DIAGNOSIS — G4733 Obstructive sleep apnea (adult) (pediatric): Secondary | ICD-10-CM | POA: Diagnosis not present

## 2014-02-23 DIAGNOSIS — E119 Type 2 diabetes mellitus without complications: Secondary | ICD-10-CM | POA: Diagnosis not present

## 2014-02-23 DIAGNOSIS — G3184 Mild cognitive impairment, so stated: Secondary | ICD-10-CM | POA: Diagnosis not present

## 2014-02-23 DIAGNOSIS — I639 Cerebral infarction, unspecified: Secondary | ICD-10-CM | POA: Diagnosis not present

## 2014-03-04 DIAGNOSIS — I10 Essential (primary) hypertension: Secondary | ICD-10-CM | POA: Diagnosis not present

## 2014-03-04 DIAGNOSIS — E785 Hyperlipidemia, unspecified: Secondary | ICD-10-CM | POA: Diagnosis not present

## 2014-03-04 DIAGNOSIS — Z7982 Long term (current) use of aspirin: Secondary | ICD-10-CM | POA: Diagnosis not present

## 2014-03-04 DIAGNOSIS — Z87891 Personal history of nicotine dependence: Secondary | ICD-10-CM | POA: Diagnosis not present

## 2014-03-04 DIAGNOSIS — Z8546 Personal history of malignant neoplasm of prostate: Secondary | ICD-10-CM | POA: Diagnosis not present

## 2014-03-04 DIAGNOSIS — E079 Disorder of thyroid, unspecified: Secondary | ICD-10-CM | POA: Diagnosis not present

## 2014-03-04 DIAGNOSIS — Z7951 Long term (current) use of inhaled steroids: Secondary | ICD-10-CM | POA: Diagnosis not present

## 2014-03-04 DIAGNOSIS — Z79899 Other long term (current) drug therapy: Secondary | ICD-10-CM | POA: Diagnosis not present

## 2014-03-04 DIAGNOSIS — M1990 Unspecified osteoarthritis, unspecified site: Secondary | ICD-10-CM | POA: Diagnosis not present

## 2014-03-04 DIAGNOSIS — I4891 Unspecified atrial fibrillation: Secondary | ICD-10-CM | POA: Diagnosis not present

## 2014-03-04 DIAGNOSIS — Z7901 Long term (current) use of anticoagulants: Secondary | ICD-10-CM | POA: Diagnosis not present

## 2014-03-04 DIAGNOSIS — Z955 Presence of coronary angioplasty implant and graft: Secondary | ICD-10-CM | POA: Diagnosis not present

## 2014-03-04 DIAGNOSIS — E119 Type 2 diabetes mellitus without complications: Secondary | ICD-10-CM | POA: Diagnosis not present

## 2014-03-04 DIAGNOSIS — J45909 Unspecified asthma, uncomplicated: Secondary | ICD-10-CM | POA: Diagnosis not present

## 2014-03-04 DIAGNOSIS — G629 Polyneuropathy, unspecified: Secondary | ICD-10-CM | POA: Diagnosis not present

## 2014-03-15 DIAGNOSIS — R609 Edema, unspecified: Secondary | ICD-10-CM | POA: Diagnosis not present

## 2014-03-15 DIAGNOSIS — I83891 Varicose veins of right lower extremities with other complications: Secondary | ICD-10-CM | POA: Diagnosis not present

## 2014-03-15 DIAGNOSIS — M79661 Pain in right lower leg: Secondary | ICD-10-CM | POA: Diagnosis not present

## 2014-03-15 DIAGNOSIS — I8390 Asymptomatic varicose veins of unspecified lower extremity: Secondary | ICD-10-CM | POA: Diagnosis not present

## 2014-03-21 DIAGNOSIS — F902 Attention-deficit hyperactivity disorder, combined type: Secondary | ICD-10-CM | POA: Diagnosis not present

## 2014-03-30 DIAGNOSIS — I2510 Atherosclerotic heart disease of native coronary artery without angina pectoris: Secondary | ICD-10-CM | POA: Diagnosis not present

## 2014-03-30 DIAGNOSIS — I429 Cardiomyopathy, unspecified: Secondary | ICD-10-CM | POA: Diagnosis not present

## 2014-03-30 DIAGNOSIS — I10 Essential (primary) hypertension: Secondary | ICD-10-CM | POA: Diagnosis not present

## 2014-03-30 DIAGNOSIS — I482 Chronic atrial fibrillation: Secondary | ICD-10-CM | POA: Diagnosis not present

## 2014-03-30 DIAGNOSIS — E1165 Type 2 diabetes mellitus with hyperglycemia: Secondary | ICD-10-CM | POA: Diagnosis not present

## 2014-03-31 DIAGNOSIS — F902 Attention-deficit hyperactivity disorder, combined type: Secondary | ICD-10-CM | POA: Diagnosis not present

## 2014-03-31 DIAGNOSIS — L03115 Cellulitis of right lower limb: Secondary | ICD-10-CM | POA: Diagnosis not present

## 2014-03-31 DIAGNOSIS — N182 Chronic kidney disease, stage 2 (mild): Secondary | ICD-10-CM | POA: Diagnosis not present

## 2014-03-31 DIAGNOSIS — E1121 Type 2 diabetes mellitus with diabetic nephropathy: Secondary | ICD-10-CM | POA: Diagnosis not present

## 2014-05-15 DIAGNOSIS — M7989 Other specified soft tissue disorders: Secondary | ICD-10-CM | POA: Diagnosis not present

## 2014-05-15 DIAGNOSIS — J45909 Unspecified asthma, uncomplicated: Secondary | ICD-10-CM | POA: Diagnosis not present

## 2014-05-15 DIAGNOSIS — Z8546 Personal history of malignant neoplasm of prostate: Secondary | ICD-10-CM | POA: Diagnosis not present

## 2014-05-15 DIAGNOSIS — E079 Disorder of thyroid, unspecified: Secondary | ICD-10-CM | POA: Diagnosis not present

## 2014-05-15 DIAGNOSIS — L03116 Cellulitis of left lower limb: Secondary | ICD-10-CM | POA: Diagnosis not present

## 2014-05-15 DIAGNOSIS — W228XXA Striking against or struck by other objects, initial encounter: Secondary | ICD-10-CM | POA: Diagnosis not present

## 2014-05-15 DIAGNOSIS — Z79899 Other long term (current) drug therapy: Secondary | ICD-10-CM | POA: Diagnosis not present

## 2014-05-15 DIAGNOSIS — Z7982 Long term (current) use of aspirin: Secondary | ICD-10-CM | POA: Diagnosis not present

## 2014-05-15 DIAGNOSIS — I10 Essential (primary) hypertension: Secondary | ICD-10-CM | POA: Diagnosis not present

## 2014-05-15 DIAGNOSIS — Z7901 Long term (current) use of anticoagulants: Secondary | ICD-10-CM | POA: Diagnosis not present

## 2014-05-15 DIAGNOSIS — E785 Hyperlipidemia, unspecified: Secondary | ICD-10-CM | POA: Diagnosis not present

## 2014-05-15 DIAGNOSIS — E119 Type 2 diabetes mellitus without complications: Secondary | ICD-10-CM | POA: Diagnosis not present

## 2014-05-15 DIAGNOSIS — Z87891 Personal history of nicotine dependence: Secondary | ICD-10-CM | POA: Diagnosis not present

## 2014-05-15 DIAGNOSIS — Z7951 Long term (current) use of inhaled steroids: Secondary | ICD-10-CM | POA: Diagnosis not present

## 2014-05-15 DIAGNOSIS — Z7902 Long term (current) use of antithrombotics/antiplatelets: Secondary | ICD-10-CM | POA: Diagnosis not present

## 2014-05-15 DIAGNOSIS — S8012XA Contusion of left lower leg, initial encounter: Secondary | ICD-10-CM | POA: Diagnosis not present

## 2014-05-15 DIAGNOSIS — R609 Edema, unspecified: Secondary | ICD-10-CM | POA: Diagnosis not present

## 2014-05-15 DIAGNOSIS — I4891 Unspecified atrial fibrillation: Secondary | ICD-10-CM | POA: Diagnosis not present

## 2014-05-15 DIAGNOSIS — M25469 Effusion, unspecified knee: Secondary | ICD-10-CM | POA: Diagnosis not present

## 2014-05-19 DIAGNOSIS — R419 Unspecified symptoms and signs involving cognitive functions and awareness: Secondary | ICD-10-CM | POA: Diagnosis not present

## 2014-05-31 DIAGNOSIS — J4520 Mild intermittent asthma, uncomplicated: Secondary | ICD-10-CM | POA: Diagnosis not present

## 2014-05-31 DIAGNOSIS — G4733 Obstructive sleep apnea (adult) (pediatric): Secondary | ICD-10-CM | POA: Diagnosis not present

## 2014-05-31 DIAGNOSIS — R918 Other nonspecific abnormal finding of lung field: Secondary | ICD-10-CM | POA: Diagnosis not present

## 2014-05-31 DIAGNOSIS — J301 Allergic rhinitis due to pollen: Secondary | ICD-10-CM | POA: Diagnosis not present

## 2014-06-01 DIAGNOSIS — R1032 Left lower quadrant pain: Secondary | ICD-10-CM | POA: Diagnosis not present

## 2014-06-01 DIAGNOSIS — M25551 Pain in right hip: Secondary | ICD-10-CM | POA: Diagnosis not present

## 2014-06-01 DIAGNOSIS — N182 Chronic kidney disease, stage 2 (mild): Secondary | ICD-10-CM | POA: Diagnosis not present

## 2014-06-01 DIAGNOSIS — Z79899 Other long term (current) drug therapy: Secondary | ICD-10-CM | POA: Diagnosis not present

## 2014-06-01 DIAGNOSIS — Z5181 Encounter for therapeutic drug level monitoring: Secondary | ICD-10-CM | POA: Diagnosis not present

## 2014-06-01 DIAGNOSIS — L03119 Cellulitis of unspecified part of limb: Secondary | ICD-10-CM | POA: Diagnosis not present

## 2014-06-01 DIAGNOSIS — E1122 Type 2 diabetes mellitus with diabetic chronic kidney disease: Secondary | ICD-10-CM | POA: Diagnosis not present

## 2014-06-02 DIAGNOSIS — R918 Other nonspecific abnormal finding of lung field: Secondary | ICD-10-CM | POA: Diagnosis not present

## 2014-06-02 DIAGNOSIS — J8410 Pulmonary fibrosis, unspecified: Secondary | ICD-10-CM | POA: Diagnosis not present

## 2014-08-24 DIAGNOSIS — R05 Cough: Secondary | ICD-10-CM | POA: Diagnosis not present

## 2014-08-24 DIAGNOSIS — R918 Other nonspecific abnormal finding of lung field: Secondary | ICD-10-CM | POA: Diagnosis not present

## 2014-08-24 DIAGNOSIS — J4520 Mild intermittent asthma, uncomplicated: Secondary | ICD-10-CM | POA: Diagnosis not present

## 2014-08-24 DIAGNOSIS — J301 Allergic rhinitis due to pollen: Secondary | ICD-10-CM | POA: Diagnosis not present

## 2014-08-31 DIAGNOSIS — Z79899 Other long term (current) drug therapy: Secondary | ICD-10-CM | POA: Diagnosis not present

## 2014-08-31 DIAGNOSIS — E1122 Type 2 diabetes mellitus with diabetic chronic kidney disease: Secondary | ICD-10-CM | POA: Diagnosis not present

## 2014-08-31 DIAGNOSIS — M545 Low back pain: Secondary | ICD-10-CM | POA: Diagnosis not present

## 2014-08-31 DIAGNOSIS — N08 Glomerular disorders in diseases classified elsewhere: Secondary | ICD-10-CM | POA: Diagnosis not present

## 2014-08-31 DIAGNOSIS — Z23 Encounter for immunization: Secondary | ICD-10-CM | POA: Diagnosis not present

## 2014-08-31 DIAGNOSIS — Z125 Encounter for screening for malignant neoplasm of prostate: Secondary | ICD-10-CM | POA: Diagnosis not present

## 2014-08-31 DIAGNOSIS — I129 Hypertensive chronic kidney disease with stage 1 through stage 4 chronic kidney disease, or unspecified chronic kidney disease: Secondary | ICD-10-CM | POA: Diagnosis not present

## 2014-08-31 DIAGNOSIS — E559 Vitamin D deficiency, unspecified: Secondary | ICD-10-CM | POA: Diagnosis not present

## 2014-08-31 DIAGNOSIS — I1310 Hypertensive heart and chronic kidney disease without heart failure, with stage 1 through stage 4 chronic kidney disease, or unspecified chronic kidney disease: Secondary | ICD-10-CM | POA: Diagnosis not present

## 2014-08-31 DIAGNOSIS — R195 Other fecal abnormalities: Secondary | ICD-10-CM | POA: Diagnosis not present

## 2014-08-31 DIAGNOSIS — Z1389 Encounter for screening for other disorder: Secondary | ICD-10-CM | POA: Diagnosis not present

## 2014-08-31 DIAGNOSIS — N182 Chronic kidney disease, stage 2 (mild): Secondary | ICD-10-CM | POA: Diagnosis not present

## 2014-08-31 DIAGNOSIS — D649 Anemia, unspecified: Secondary | ICD-10-CM | POA: Diagnosis not present

## 2014-08-31 DIAGNOSIS — Z0000 Encounter for general adult medical examination without abnormal findings: Secondary | ICD-10-CM | POA: Diagnosis not present

## 2014-09-20 DIAGNOSIS — I129 Hypertensive chronic kidney disease with stage 1 through stage 4 chronic kidney disease, or unspecified chronic kidney disease: Secondary | ICD-10-CM | POA: Diagnosis not present

## 2014-09-20 DIAGNOSIS — N182 Chronic kidney disease, stage 2 (mild): Secondary | ICD-10-CM | POA: Diagnosis not present

## 2014-09-20 DIAGNOSIS — Z7189 Other specified counseling: Secondary | ICD-10-CM | POA: Diagnosis not present

## 2014-11-03 DIAGNOSIS — R002 Palpitations: Secondary | ICD-10-CM | POA: Diagnosis not present

## 2014-11-03 DIAGNOSIS — I10 Essential (primary) hypertension: Secondary | ICD-10-CM | POA: Diagnosis not present

## 2014-11-03 DIAGNOSIS — I429 Cardiomyopathy, unspecified: Secondary | ICD-10-CM | POA: Diagnosis not present

## 2014-11-03 DIAGNOSIS — I2510 Atherosclerotic heart disease of native coronary artery without angina pectoris: Secondary | ICD-10-CM | POA: Diagnosis not present

## 2014-11-03 DIAGNOSIS — E1165 Type 2 diabetes mellitus with hyperglycemia: Secondary | ICD-10-CM | POA: Diagnosis not present

## 2014-11-03 DIAGNOSIS — I482 Chronic atrial fibrillation: Secondary | ICD-10-CM | POA: Diagnosis not present

## 2014-11-03 DIAGNOSIS — E784 Other hyperlipidemia: Secondary | ICD-10-CM | POA: Diagnosis not present

## 2014-11-07 DIAGNOSIS — R32 Unspecified urinary incontinence: Secondary | ICD-10-CM | POA: Diagnosis not present

## 2014-11-07 DIAGNOSIS — I1310 Hypertensive heart and chronic kidney disease without heart failure, with stage 1 through stage 4 chronic kidney disease, or unspecified chronic kidney disease: Secondary | ICD-10-CM | POA: Diagnosis not present

## 2014-11-07 DIAGNOSIS — I2584 Coronary atherosclerosis due to calcified coronary lesion: Secondary | ICD-10-CM | POA: Diagnosis not present

## 2014-11-07 DIAGNOSIS — N182 Chronic kidney disease, stage 2 (mild): Secondary | ICD-10-CM | POA: Diagnosis not present

## 2014-11-10 DIAGNOSIS — C61 Malignant neoplasm of prostate: Secondary | ICD-10-CM | POA: Diagnosis not present

## 2014-11-10 DIAGNOSIS — R32 Unspecified urinary incontinence: Secondary | ICD-10-CM | POA: Diagnosis not present

## 2014-11-10 DIAGNOSIS — N3941 Urge incontinence: Secondary | ICD-10-CM | POA: Diagnosis not present

## 2014-11-24 DIAGNOSIS — G4733 Obstructive sleep apnea (adult) (pediatric): Secondary | ICD-10-CM | POA: Diagnosis not present

## 2014-11-24 DIAGNOSIS — R5383 Other fatigue: Secondary | ICD-10-CM | POA: Diagnosis not present

## 2014-12-19 DIAGNOSIS — G4733 Obstructive sleep apnea (adult) (pediatric): Secondary | ICD-10-CM | POA: Diagnosis not present

## 2014-12-19 DIAGNOSIS — J342 Deviated nasal septum: Secondary | ICD-10-CM | POA: Diagnosis not present

## 2014-12-19 DIAGNOSIS — R0981 Nasal congestion: Secondary | ICD-10-CM | POA: Diagnosis not present

## 2015-01-02 DIAGNOSIS — Z23 Encounter for immunization: Secondary | ICD-10-CM | POA: Diagnosis not present

## 2015-01-02 DIAGNOSIS — N182 Chronic kidney disease, stage 2 (mild): Secondary | ICD-10-CM | POA: Diagnosis not present

## 2015-01-02 DIAGNOSIS — I1310 Hypertensive heart and chronic kidney disease without heart failure, with stage 1 through stage 4 chronic kidney disease, or unspecified chronic kidney disease: Secondary | ICD-10-CM | POA: Diagnosis not present

## 2015-01-02 DIAGNOSIS — N08 Glomerular disorders in diseases classified elsewhere: Secondary | ICD-10-CM | POA: Diagnosis not present

## 2015-01-02 DIAGNOSIS — E1122 Type 2 diabetes mellitus with diabetic chronic kidney disease: Secondary | ICD-10-CM | POA: Diagnosis not present

## 2015-01-09 DIAGNOSIS — R0981 Nasal congestion: Secondary | ICD-10-CM | POA: Diagnosis not present

## 2015-01-09 DIAGNOSIS — J342 Deviated nasal septum: Secondary | ICD-10-CM | POA: Diagnosis not present

## 2015-01-24 ENCOUNTER — Other Ambulatory Visit: Payer: Self-pay

## 2015-01-24 DIAGNOSIS — Z9981 Dependence on supplemental oxygen: Secondary | ICD-10-CM | POA: Diagnosis not present

## 2015-01-24 DIAGNOSIS — Z87891 Personal history of nicotine dependence: Secondary | ICD-10-CM | POA: Diagnosis not present

## 2015-01-24 DIAGNOSIS — G4733 Obstructive sleep apnea (adult) (pediatric): Secondary | ICD-10-CM | POA: Diagnosis not present

## 2015-01-24 DIAGNOSIS — E785 Hyperlipidemia, unspecified: Secondary | ICD-10-CM | POA: Diagnosis not present

## 2015-01-24 DIAGNOSIS — E119 Type 2 diabetes mellitus without complications: Secondary | ICD-10-CM | POA: Diagnosis not present

## 2015-01-24 DIAGNOSIS — Z7982 Long term (current) use of aspirin: Secondary | ICD-10-CM | POA: Diagnosis not present

## 2015-01-24 DIAGNOSIS — R0981 Nasal congestion: Secondary | ICD-10-CM | POA: Diagnosis not present

## 2015-01-24 DIAGNOSIS — J343 Hypertrophy of nasal turbinates: Secondary | ICD-10-CM | POA: Diagnosis not present

## 2015-01-24 DIAGNOSIS — J342 Deviated nasal septum: Secondary | ICD-10-CM | POA: Diagnosis not present

## 2015-01-24 DIAGNOSIS — Z79899 Other long term (current) drug therapy: Secondary | ICD-10-CM | POA: Diagnosis not present

## 2015-01-24 DIAGNOSIS — J45909 Unspecified asthma, uncomplicated: Secondary | ICD-10-CM | POA: Diagnosis not present

## 2015-01-24 DIAGNOSIS — Z7901 Long term (current) use of anticoagulants: Secondary | ICD-10-CM | POA: Diagnosis not present

## 2015-01-26 DIAGNOSIS — R413 Other amnesia: Secondary | ICD-10-CM | POA: Diagnosis not present

## 2015-02-07 DIAGNOSIS — R918 Other nonspecific abnormal finding of lung field: Secondary | ICD-10-CM | POA: Diagnosis not present

## 2015-02-07 DIAGNOSIS — I2510 Atherosclerotic heart disease of native coronary artery without angina pectoris: Secondary | ICD-10-CM | POA: Diagnosis not present

## 2015-02-09 DIAGNOSIS — J301 Allergic rhinitis due to pollen: Secondary | ICD-10-CM | POA: Diagnosis not present

## 2015-02-09 DIAGNOSIS — J4520 Mild intermittent asthma, uncomplicated: Secondary | ICD-10-CM | POA: Diagnosis not present

## 2015-02-09 DIAGNOSIS — R918 Other nonspecific abnormal finding of lung field: Secondary | ICD-10-CM | POA: Diagnosis not present

## 2015-02-09 DIAGNOSIS — G4733 Obstructive sleep apnea (adult) (pediatric): Secondary | ICD-10-CM | POA: Diagnosis not present

## 2015-02-14 DIAGNOSIS — R413 Other amnesia: Secondary | ICD-10-CM | POA: Diagnosis not present

## 2015-04-05 DIAGNOSIS — E1122 Type 2 diabetes mellitus with diabetic chronic kidney disease: Secondary | ICD-10-CM | POA: Diagnosis not present

## 2015-04-05 DIAGNOSIS — N08 Glomerular disorders in diseases classified elsewhere: Secondary | ICD-10-CM | POA: Diagnosis not present

## 2015-04-05 DIAGNOSIS — N182 Chronic kidney disease, stage 2 (mild): Secondary | ICD-10-CM | POA: Diagnosis not present

## 2015-04-05 DIAGNOSIS — I2584 Coronary atherosclerosis due to calcified coronary lesion: Secondary | ICD-10-CM | POA: Diagnosis not present

## 2015-04-25 DIAGNOSIS — G4733 Obstructive sleep apnea (adult) (pediatric): Secondary | ICD-10-CM | POA: Diagnosis not present

## 2015-04-27 DIAGNOSIS — R918 Other nonspecific abnormal finding of lung field: Secondary | ICD-10-CM | POA: Diagnosis not present

## 2015-04-27 DIAGNOSIS — J309 Allergic rhinitis, unspecified: Secondary | ICD-10-CM | POA: Diagnosis not present

## 2015-04-27 DIAGNOSIS — G4733 Obstructive sleep apnea (adult) (pediatric): Secondary | ICD-10-CM | POA: Diagnosis not present

## 2015-04-27 DIAGNOSIS — J4520 Mild intermittent asthma, uncomplicated: Secondary | ICD-10-CM | POA: Diagnosis not present

## 2015-05-06 DIAGNOSIS — Z87891 Personal history of nicotine dependence: Secondary | ICD-10-CM | POA: Diagnosis not present

## 2015-05-06 DIAGNOSIS — I10 Essential (primary) hypertension: Secondary | ICD-10-CM | POA: Diagnosis not present

## 2015-05-06 DIAGNOSIS — Z7951 Long term (current) use of inhaled steroids: Secondary | ICD-10-CM | POA: Diagnosis not present

## 2015-05-06 DIAGNOSIS — E119 Type 2 diabetes mellitus without complications: Secondary | ICD-10-CM | POA: Diagnosis not present

## 2015-05-06 DIAGNOSIS — M1990 Unspecified osteoarthritis, unspecified site: Secondary | ICD-10-CM | POA: Diagnosis not present

## 2015-05-06 DIAGNOSIS — E079 Disorder of thyroid, unspecified: Secondary | ICD-10-CM | POA: Diagnosis not present

## 2015-05-06 DIAGNOSIS — Z79899 Other long term (current) drug therapy: Secondary | ICD-10-CM | POA: Diagnosis not present

## 2015-05-06 DIAGNOSIS — I4891 Unspecified atrial fibrillation: Secondary | ICD-10-CM | POA: Diagnosis not present

## 2015-05-06 DIAGNOSIS — Z7982 Long term (current) use of aspirin: Secondary | ICD-10-CM | POA: Diagnosis not present

## 2015-05-06 DIAGNOSIS — E785 Hyperlipidemia, unspecified: Secondary | ICD-10-CM | POA: Diagnosis not present

## 2015-05-06 DIAGNOSIS — J45909 Unspecified asthma, uncomplicated: Secondary | ICD-10-CM | POA: Diagnosis not present

## 2015-05-06 DIAGNOSIS — S7011XA Contusion of right thigh, initial encounter: Secondary | ICD-10-CM | POA: Diagnosis not present

## 2015-05-06 DIAGNOSIS — Z8546 Personal history of malignant neoplasm of prostate: Secondary | ICD-10-CM | POA: Diagnosis not present

## 2015-05-06 DIAGNOSIS — D689 Coagulation defect, unspecified: Secondary | ICD-10-CM | POA: Diagnosis not present

## 2015-05-06 DIAGNOSIS — Z7901 Long term (current) use of anticoagulants: Secondary | ICD-10-CM | POA: Diagnosis not present

## 2015-05-06 DIAGNOSIS — Z7984 Long term (current) use of oral hypoglycemic drugs: Secondary | ICD-10-CM | POA: Diagnosis not present

## 2015-05-10 DIAGNOSIS — L03115 Cellulitis of right lower limb: Secondary | ICD-10-CM | POA: Diagnosis not present

## 2015-05-10 DIAGNOSIS — E1122 Type 2 diabetes mellitus with diabetic chronic kidney disease: Secondary | ICD-10-CM | POA: Diagnosis not present

## 2015-05-10 DIAGNOSIS — R233 Spontaneous ecchymoses: Secondary | ICD-10-CM | POA: Diagnosis not present

## 2015-05-10 DIAGNOSIS — N182 Chronic kidney disease, stage 2 (mild): Secondary | ICD-10-CM | POA: Diagnosis not present

## 2015-05-12 DIAGNOSIS — M7989 Other specified soft tissue disorders: Secondary | ICD-10-CM | POA: Diagnosis not present

## 2015-05-19 DIAGNOSIS — L03115 Cellulitis of right lower limb: Secondary | ICD-10-CM | POA: Diagnosis not present

## 2015-05-19 DIAGNOSIS — J029 Acute pharyngitis, unspecified: Secondary | ICD-10-CM | POA: Diagnosis not present

## 2015-05-31 DIAGNOSIS — G4733 Obstructive sleep apnea (adult) (pediatric): Secondary | ICD-10-CM | POA: Diagnosis not present

## 2015-06-08 DIAGNOSIS — R918 Other nonspecific abnormal finding of lung field: Secondary | ICD-10-CM | POA: Diagnosis not present

## 2015-06-08 DIAGNOSIS — J4520 Mild intermittent asthma, uncomplicated: Secondary | ICD-10-CM | POA: Diagnosis not present

## 2015-06-08 DIAGNOSIS — G4733 Obstructive sleep apnea (adult) (pediatric): Secondary | ICD-10-CM | POA: Diagnosis not present

## 2015-06-08 DIAGNOSIS — R5383 Other fatigue: Secondary | ICD-10-CM | POA: Diagnosis not present

## 2015-06-08 DIAGNOSIS — J301 Allergic rhinitis due to pollen: Secondary | ICD-10-CM | POA: Diagnosis not present

## 2015-06-09 DIAGNOSIS — H608X3 Other otitis externa, bilateral: Secondary | ICD-10-CM | POA: Diagnosis not present

## 2015-06-09 DIAGNOSIS — G4733 Obstructive sleep apnea (adult) (pediatric): Secondary | ICD-10-CM | POA: Diagnosis not present

## 2015-06-09 DIAGNOSIS — J387 Other diseases of larynx: Secondary | ICD-10-CM | POA: Diagnosis not present

## 2015-06-29 DIAGNOSIS — H40013 Open angle with borderline findings, low risk, bilateral: Secondary | ICD-10-CM | POA: Diagnosis not present

## 2015-06-29 DIAGNOSIS — H25813 Combined forms of age-related cataract, bilateral: Secondary | ICD-10-CM | POA: Diagnosis not present

## 2015-06-29 DIAGNOSIS — E119 Type 2 diabetes mellitus without complications: Secondary | ICD-10-CM | POA: Diagnosis not present

## 2015-06-29 DIAGNOSIS — H02831 Dermatochalasis of right upper eyelid: Secondary | ICD-10-CM | POA: Diagnosis not present

## 2015-06-29 DIAGNOSIS — H02834 Dermatochalasis of left upper eyelid: Secondary | ICD-10-CM | POA: Diagnosis not present

## 2015-07-05 DIAGNOSIS — I8312 Varicose veins of left lower extremity with inflammation: Secondary | ICD-10-CM | POA: Diagnosis not present

## 2015-07-05 DIAGNOSIS — N182 Chronic kidney disease, stage 2 (mild): Secondary | ICD-10-CM | POA: Diagnosis not present

## 2015-07-05 DIAGNOSIS — I1310 Hypertensive heart and chronic kidney disease without heart failure, with stage 1 through stage 4 chronic kidney disease, or unspecified chronic kidney disease: Secondary | ICD-10-CM | POA: Diagnosis not present

## 2015-07-05 DIAGNOSIS — I2583 Coronary atherosclerosis due to lipid rich plaque: Secondary | ICD-10-CM | POA: Diagnosis not present

## 2015-07-07 DIAGNOSIS — G4733 Obstructive sleep apnea (adult) (pediatric): Secondary | ICD-10-CM | POA: Diagnosis not present

## 2015-07-07 DIAGNOSIS — K1379 Other lesions of oral mucosa: Secondary | ICD-10-CM | POA: Diagnosis not present

## 2015-07-07 DIAGNOSIS — J310 Chronic rhinitis: Secondary | ICD-10-CM | POA: Diagnosis not present

## 2015-07-13 DIAGNOSIS — I10 Essential (primary) hypertension: Secondary | ICD-10-CM | POA: Diagnosis not present

## 2015-07-13 DIAGNOSIS — E119 Type 2 diabetes mellitus without complications: Secondary | ICD-10-CM | POA: Diagnosis not present

## 2015-07-13 DIAGNOSIS — K1379 Other lesions of oral mucosa: Secondary | ICD-10-CM | POA: Diagnosis not present

## 2015-07-13 DIAGNOSIS — E785 Hyperlipidemia, unspecified: Secondary | ICD-10-CM | POA: Diagnosis not present

## 2015-07-13 DIAGNOSIS — Z01818 Encounter for other preprocedural examination: Secondary | ICD-10-CM | POA: Diagnosis not present

## 2015-07-17 DIAGNOSIS — J45909 Unspecified asthma, uncomplicated: Secondary | ICD-10-CM | POA: Diagnosis not present

## 2015-07-17 DIAGNOSIS — I429 Cardiomyopathy, unspecified: Secondary | ICD-10-CM | POA: Diagnosis not present

## 2015-07-17 DIAGNOSIS — Z7901 Long term (current) use of anticoagulants: Secondary | ICD-10-CM | POA: Diagnosis not present

## 2015-07-17 DIAGNOSIS — K219 Gastro-esophageal reflux disease without esophagitis: Secondary | ICD-10-CM | POA: Diagnosis not present

## 2015-07-17 DIAGNOSIS — K1379 Other lesions of oral mucosa: Secondary | ICD-10-CM

## 2015-07-17 DIAGNOSIS — R0989 Other specified symptoms and signs involving the circulatory and respiratory systems: Secondary | ICD-10-CM | POA: Insufficient documentation

## 2015-07-17 DIAGNOSIS — Z79899 Other long term (current) drug therapy: Secondary | ICD-10-CM | POA: Diagnosis not present

## 2015-07-17 DIAGNOSIS — R220 Localized swelling, mass and lump, head: Secondary | ICD-10-CM | POA: Diagnosis not present

## 2015-07-17 DIAGNOSIS — E669 Obesity, unspecified: Secondary | ICD-10-CM | POA: Diagnosis not present

## 2015-07-17 DIAGNOSIS — G2581 Restless legs syndrome: Secondary | ICD-10-CM | POA: Diagnosis not present

## 2015-07-17 DIAGNOSIS — E119 Type 2 diabetes mellitus without complications: Secondary | ICD-10-CM | POA: Diagnosis not present

## 2015-07-17 DIAGNOSIS — Z955 Presence of coronary angioplasty implant and graft: Secondary | ICD-10-CM | POA: Diagnosis not present

## 2015-07-17 DIAGNOSIS — I2510 Atherosclerotic heart disease of native coronary artery without angina pectoris: Secondary | ICD-10-CM | POA: Diagnosis not present

## 2015-07-17 DIAGNOSIS — I10 Essential (primary) hypertension: Secondary | ICD-10-CM | POA: Diagnosis not present

## 2015-07-17 DIAGNOSIS — Z7984 Long term (current) use of oral hypoglycemic drugs: Secondary | ICD-10-CM | POA: Diagnosis not present

## 2015-07-17 DIAGNOSIS — Z9852 Vasectomy status: Secondary | ICD-10-CM | POA: Diagnosis not present

## 2015-07-17 DIAGNOSIS — Z7982 Long term (current) use of aspirin: Secondary | ICD-10-CM | POA: Diagnosis not present

## 2015-07-17 DIAGNOSIS — G4733 Obstructive sleep apnea (adult) (pediatric): Secondary | ICD-10-CM | POA: Diagnosis not present

## 2015-07-17 DIAGNOSIS — E785 Hyperlipidemia, unspecified: Secondary | ICD-10-CM | POA: Diagnosis not present

## 2015-07-17 DIAGNOSIS — M1990 Unspecified osteoarthritis, unspecified site: Secondary | ICD-10-CM | POA: Diagnosis not present

## 2015-07-17 DIAGNOSIS — I482 Chronic atrial fibrillation: Secondary | ICD-10-CM | POA: Diagnosis not present

## 2015-07-17 DIAGNOSIS — Z7951 Long term (current) use of inhaled steroids: Secondary | ICD-10-CM | POA: Diagnosis not present

## 2015-07-17 DIAGNOSIS — F458 Other somatoform disorders: Secondary | ICD-10-CM | POA: Diagnosis not present

## 2015-07-17 DIAGNOSIS — K122 Cellulitis and abscess of mouth: Secondary | ICD-10-CM | POA: Diagnosis not present

## 2015-07-17 DIAGNOSIS — Z87891 Personal history of nicotine dependence: Secondary | ICD-10-CM | POA: Diagnosis not present

## 2015-07-17 DIAGNOSIS — Z6831 Body mass index (BMI) 31.0-31.9, adult: Secondary | ICD-10-CM | POA: Diagnosis not present

## 2015-07-17 HISTORY — DX: Other lesions of oral mucosa: K13.79

## 2015-07-20 DIAGNOSIS — J4520 Mild intermittent asthma, uncomplicated: Secondary | ICD-10-CM | POA: Diagnosis not present

## 2015-07-20 DIAGNOSIS — G4733 Obstructive sleep apnea (adult) (pediatric): Secondary | ICD-10-CM | POA: Diagnosis not present

## 2015-07-20 DIAGNOSIS — J301 Allergic rhinitis due to pollen: Secondary | ICD-10-CM | POA: Diagnosis not present

## 2015-07-20 DIAGNOSIS — R918 Other nonspecific abnormal finding of lung field: Secondary | ICD-10-CM | POA: Diagnosis not present

## 2015-07-28 ENCOUNTER — Ambulatory Visit (HOSPITAL_COMMUNITY)
Admission: RE | Admit: 2015-07-28 | Discharge: 2015-07-28 | Disposition: A | Discharge status: Home / Self Care | Payer: Medicare Other | Source: Ambulatory Visit | Attending: Vascular Surgery | Admitting: Vascular Surgery

## 2015-07-28 ENCOUNTER — Other Ambulatory Visit (HOSPITAL_COMMUNITY): Payer: Self-pay | Admitting: Internal Medicine

## 2015-07-28 DIAGNOSIS — I878 Other specified disorders of veins: Secondary | ICD-10-CM | POA: Insufficient documentation

## 2015-07-28 DIAGNOSIS — I8393 Asymptomatic varicose veins of bilateral lower extremities: Secondary | ICD-10-CM | POA: Insufficient documentation

## 2015-07-31 HISTORY — PX: UVULECTOMY: SHX2631

## 2015-08-02 ENCOUNTER — Encounter: Payer: Self-pay | Admitting: Surgery

## 2015-08-08 ENCOUNTER — Other Ambulatory Visit: Payer: Self-pay | Admitting: *Deleted

## 2015-08-08 DIAGNOSIS — I83893 Varicose veins of bilateral lower extremities with other complications: Secondary | ICD-10-CM

## 2015-08-08 DIAGNOSIS — I482 Chronic atrial fibrillation: Secondary | ICD-10-CM | POA: Diagnosis not present

## 2015-08-08 DIAGNOSIS — I2510 Atherosclerotic heart disease of native coronary artery without angina pectoris: Secondary | ICD-10-CM | POA: Diagnosis not present

## 2015-08-08 DIAGNOSIS — E1165 Type 2 diabetes mellitus with hyperglycemia: Secondary | ICD-10-CM | POA: Diagnosis not present

## 2015-08-08 DIAGNOSIS — I255 Ischemic cardiomyopathy: Secondary | ICD-10-CM | POA: Diagnosis not present

## 2015-08-08 DIAGNOSIS — I10 Essential (primary) hypertension: Secondary | ICD-10-CM | POA: Diagnosis not present

## 2015-08-11 DIAGNOSIS — R350 Frequency of micturition: Secondary | ICD-10-CM | POA: Diagnosis not present

## 2015-08-11 DIAGNOSIS — R339 Retention of urine, unspecified: Secondary | ICD-10-CM | POA: Diagnosis not present

## 2015-08-11 DIAGNOSIS — R3911 Hesitancy of micturition: Secondary | ICD-10-CM | POA: Diagnosis not present

## 2015-08-14 ENCOUNTER — Encounter: Payer: Self-pay | Admitting: Surgery

## 2015-08-14 ENCOUNTER — Ambulatory Visit (INDEPENDENT_AMBULATORY_CARE_PROVIDER_SITE_OTHER): Payer: Medicare Other | Admitting: Surgery

## 2015-08-14 VITALS — BP 105/59 | HR 62 | Temp 97.4°F | Resp 16 | Ht 71.0 in | Wt 233.7 lb

## 2015-08-14 DIAGNOSIS — M7989 Other specified soft tissue disorders: Secondary | ICD-10-CM | POA: Diagnosis not present

## 2015-08-14 DIAGNOSIS — M79604 Pain in right leg: Secondary | ICD-10-CM | POA: Diagnosis not present

## 2015-08-14 DIAGNOSIS — I83892 Varicose veins of left lower extremities with other complications: Secondary | ICD-10-CM | POA: Insufficient documentation

## 2015-08-14 DIAGNOSIS — M79605 Pain in left leg: Secondary | ICD-10-CM

## 2015-08-14 DIAGNOSIS — I868 Varicose veins of other specified sites: Secondary | ICD-10-CM | POA: Diagnosis not present

## 2015-08-14 DIAGNOSIS — I8390 Asymptomatic varicose veins of unspecified lower extremity: Secondary | ICD-10-CM

## 2015-08-14 NOTE — Progress Notes (Signed)
[facility] [place]  Patient name: Franklin Thornton MRN: [id number] DOB: 12-Sep-1945 Sex: male  REASON FOR CONSULT: Leg swelling  Referring Physician:  Dr. Baird Cancer  HPI: Franklin Thornton is a 70 y.o. male, who is for today for evaluation of leg swelling coloration.  The patient states that this has been going on for 1-2 years.  The right leg is equally as effected as the left the patient notes that he gets swelling in his legs particularly with prolonged standing.  This is not associated with significant pain.  He has noticed that his legs have been discolored.  He denies any history of DVT or PE.  Patient has multiple medical problems.  He suffers from hypercholesterolemia which is managed with a statin.  He is on multiple medications for hypertension which is under good control.  He takes Xarelto for atrial fibrillation.  He has a history of coronary artery disease.  He has not had a MI, but has undergone percutaneous stenting.  The patient is a diabetic.  He states his A1c is around 6.  He is a former smoker.  Past Medical History  Diagnosis Date  . Atrial fibrillation ([place])   . Diabetes mellitus without complication ([place])   . Peripheral vascular disease ([place])   . Prostate cancer ([place])     No family history on file.  SOCIAL HISTORY: Social History   Social History  . Marital Status: Married    Spouse Name: N/A  . Number of Children: N/A  . Years of Education: N/A   Occupational History  . Not on file.   Social History Main Topics  . Smoking status: Former Smoker -- 2.00 packs/day for 4 years    Types: Cigarettes    Quit date: 08/13/1965  . Smokeless tobacco: Not on file  . Alcohol Use: 0.6 oz/week    1 Standard drinks or equivalent per week  . Drug Use: Not on file  . Sexual Activity: Not on file   Other Topics Concern  . Not on file   Social History Narrative  . No narrative on file    No Known Allergies  Current Outpatient Prescriptions    Medication Sig Dispense Refill  . albuterol (PROVENTIL HFA;VENTOLIN HFA) 108 (90 Base) MCG/ACT inhaler Inhale 2 puffs into the lungs every 6 (six) hours as needed for wheezing or shortness of breath.    [name] amiodarone (PACERONE) 200 MG tablet Take 200 mg by mouth daily.    [name] aspirin 81 MG tablet Take 81 mg by mouth daily.    [name] atorvastatin (LIPITOR) 20 MG tablet Take 20 mg by mouth daily.    [name] b complex vitamins capsule Take 1 capsule by mouth daily.    . beclomethasone (QVAR) 80 MCG/ACT inhaler Inhale 2 puffs into the lungs 2 (two) times daily.    . carvedilol (COREG) 6.25 MG tablet Take 6.25 mg by mouth 2 (two) times daily with a meal.    . Cholecalciferol (VITAMIN D) 2000 units tablet Take 2,000 Units by mouth daily.    . dapagliflozin propanediol (FARXIGA) 10 MG TABS tablet Take 10 mg by mouth daily.    . DULoxetine (CYMBALTA) 30 MG capsule Take 30 mg by mouth daily.    . furosemide (LASIX) 20 MG tablet Take 20 mg by mouth daily.    [name] HYDROcodone-acetaminophen (NORCO/VICODIN) 5-325 MG tablet Take 1 tablet by mouth every 6 (six) hours as needed for moderate pain.    [name] ipratropium (ATROVENT)  0.03 % nasal spray Place 2 sprays into both nostrils every 12 (twelve) hours.    [name] levocetirizine (XYZAL) 5 MG tablet Take 5 mg by mouth every evening.    [name] levothyroxine (SYNTHROID, LEVOTHROID) 50 MCG tablet Take 50 mcg by mouth daily before breakfast.    . lisinopril (PRINIVIL,ZESTRIL) 10 MG tablet Take 10 mg by mouth daily.    . Magnesium 250 MG TABS Take 1 tablet by mouth daily.    . mirabegron ER (MYRBETRIQ) 50 MG TB24 tablet Take 50 mg by mouth daily.    . Multiple Vitamin (MULTIVITAMIN) tablet Take 1 tablet by mouth daily.    . niacin (NIASPAN) 500 MG CR tablet Take 500 mg by mouth at bedtime.    . nitroGLYCERIN (NITROSTAT) 0.4 MG SL tablet Place 0.4 mg under the tongue every 5 (five) minutes as needed for chest pain.    . Omega-3 Fatty Acids (FISH OIL) 1200 MG CAPS Take 1 capsule by mouth daily.     . pantoprazole (PROTONIX) 40 MG tablet Take 40 mg by mouth daily.    . pramipexole (MIRAPEX) 0.5 MG tablet Take 0.5 mg by mouth 3 (three) times daily.    . rivaroxaban (XARELTO) 10 MG TABS tablet Take 10 mg by mouth daily.    . sitaGLIPtin-metformin (JANUMET) 50-500 MG tablet Take 1 tablet by mouth 2 (two) times daily with a meal.    . solifenacin (VESICARE) 5 MG tablet Take 5 mg by mouth daily.    . vitamin B-12 (CYANOCOBALAMIN) 1000 MCG tablet Take 1,000 mcg by mouth daily.     No current facility-administered medications for this visit.    REVIEW OF SYSTEMS:  [X]  denotes positive finding, [ ]  denotes negative finding Cardiac  Comments:  Chest pain or chest pressure:    Shortness of breath upon exertion: x   Short of breath when lying flat: x   Irregular heart rhythm:        Vascular    Pain in calf, thigh, or hip brought on by ambulation:    Pain in feet at night that wakes you up from your sleep:     Blood clot in your veins:    Leg swelling:  x       Pulmonary    Oxygen at home:    Productive cough:     Wheezing:         Neurologic    Sudden weakness in arms or legs:     Sudden numbness in arms or legs:     Sudden onset of difficulty speaking or slurred speech:    Temporary loss of vision in one eye:     Problems with dizziness:         Gastrointestinal    Blood in stool:     Vomited blood:         Genitourinary    Burning when urinating:     Blood in urine:        Psychiatric    Major depression:         Hematologic    Bleeding problems:    Problems with blood clotting too easily:        Skin    Rashes or ulcers:        Constitutional    Fever or chills:      PHYSICAL EXAM: Filed Vitals:   08/14/15 1257  BP: 105/59  Pulse: 62  Temp: 97.4 F (36.3 C)  TempSrc: Oral  Resp: 16  Height: 5\' 11"  (1.803 m)  Weight: 233 lb 11.2 oz (106.006 kg)    GENERAL: The patient is a well-nourished male, in no acute distress. The vital signs are documented  above. CARDIAC: There is a regular rate and rhythm.  VASCULAR: 1-2 plus pitting lower extremity edema.  Dermatoscope erosive bilateral lower extremity.  Palpable pedal pulses PULMONARY: There is good air exchange bilaterally without wheezing or rales. ABDOMEN: Soft and non-tender with normal pitched bowel sounds.  MUSCULOSKELETAL: There are no major deformities or cyanosis. NEUROLOGIC: No focal weakness or paresthesias are detected. SKIN: There are no ulcers or rashes noted. PSYCHIATRIC: The patient has a normal affect.  DATA:  I have reviewed his ultrasound.  There is no evidence of deep vein reflux or thrombosis.  There is mild reflux in the left great saphenous vein.  No significant superficial venous reflux on the right  MEDICAL ISSUES: The patient does have great saphenous reflux on the left, however his symptoms are equal bilateral.  Therefore, I do not think any intervention is warranted at this time.  I think the mainstay of his treatment is going to be wearing 20-30 compression stockings along with leg elevation.  I'm also referring the patient to lymphedema clinic.  He will contact me with any questions or if his left leg becomes more symptomatic.   Annamarie Major [facility] [employer]: [phone]

## 2015-08-22 ENCOUNTER — Telehealth: Payer: Self-pay | Admitting: Surgery

## 2015-08-22 DIAGNOSIS — R0600 Dyspnea, unspecified: Secondary | ICD-10-CM | POA: Diagnosis not present

## 2015-08-22 NOTE — Telephone Encounter (Signed)
Patient scheduled for Lymphedema therapy at [facility] [phone] on Friday 08/25/15 at 9am.

## 2015-08-25 DIAGNOSIS — E1169 Type 2 diabetes mellitus with other specified complication: Secondary | ICD-10-CM | POA: Diagnosis not present

## 2015-08-25 DIAGNOSIS — I890 Lymphedema, not elsewhere classified: Secondary | ICD-10-CM | POA: Diagnosis not present

## 2015-08-25 DIAGNOSIS — I2510 Atherosclerotic heart disease of native coronary artery without angina pectoris: Secondary | ICD-10-CM | POA: Diagnosis not present

## 2015-08-28 DIAGNOSIS — R32 Unspecified urinary incontinence: Secondary | ICD-10-CM | POA: Diagnosis not present

## 2015-08-28 DIAGNOSIS — N3941 Urge incontinence: Secondary | ICD-10-CM | POA: Diagnosis not present

## 2015-08-28 DIAGNOSIS — R3911 Hesitancy of micturition: Secondary | ICD-10-CM | POA: Diagnosis not present

## 2015-08-28 DIAGNOSIS — R350 Frequency of micturition: Secondary | ICD-10-CM | POA: Diagnosis not present

## 2015-09-01 DIAGNOSIS — I2510 Atherosclerotic heart disease of native coronary artery without angina pectoris: Secondary | ICD-10-CM | POA: Diagnosis not present

## 2015-09-01 DIAGNOSIS — I890 Lymphedema, not elsewhere classified: Secondary | ICD-10-CM | POA: Diagnosis not present

## 2015-09-01 DIAGNOSIS — E119 Type 2 diabetes mellitus without complications: Secondary | ICD-10-CM | POA: Diagnosis not present

## 2015-09-05 DIAGNOSIS — N08 Glomerular disorders in diseases classified elsewhere: Secondary | ICD-10-CM | POA: Diagnosis not present

## 2015-09-05 DIAGNOSIS — Z0000 Encounter for general adult medical examination without abnormal findings: Secondary | ICD-10-CM | POA: Diagnosis not present

## 2015-09-05 DIAGNOSIS — E1122 Type 2 diabetes mellitus with diabetic chronic kidney disease: Secondary | ICD-10-CM | POA: Diagnosis not present

## 2015-09-05 DIAGNOSIS — N182 Chronic kidney disease, stage 2 (mild): Secondary | ICD-10-CM | POA: Diagnosis not present

## 2015-09-05 DIAGNOSIS — I1310 Hypertensive heart and chronic kidney disease without heart failure, with stage 1 through stage 4 chronic kidney disease, or unspecified chronic kidney disease: Secondary | ICD-10-CM | POA: Diagnosis not present

## 2015-09-05 DIAGNOSIS — E559 Vitamin D deficiency, unspecified: Secondary | ICD-10-CM | POA: Diagnosis not present

## 2015-09-08 ENCOUNTER — Encounter (HOSPITAL_COMMUNITY): Payer: TRICARE For Life (TFL)

## 2015-09-15 ENCOUNTER — Encounter (HOSPITAL_COMMUNITY): Payer: TRICARE For Life (TFL)

## 2015-09-15 ENCOUNTER — Encounter: Payer: TRICARE For Life (TFL) | Admitting: Vascular Surgery

## 2015-10-12 DIAGNOSIS — M545 Low back pain: Secondary | ICD-10-CM | POA: Diagnosis not present

## 2015-10-12 DIAGNOSIS — G894 Chronic pain syndrome: Secondary | ICD-10-CM | POA: Diagnosis not present

## 2015-10-12 DIAGNOSIS — F329 Major depressive disorder, single episode, unspecified: Secondary | ICD-10-CM | POA: Diagnosis not present

## 2015-10-12 DIAGNOSIS — Z87891 Personal history of nicotine dependence: Secondary | ICD-10-CM | POA: Diagnosis not present

## 2015-11-03 DIAGNOSIS — S022XXA Fracture of nasal bones, initial encounter for closed fracture: Secondary | ICD-10-CM | POA: Diagnosis not present

## 2015-11-03 DIAGNOSIS — I4891 Unspecified atrial fibrillation: Secondary | ICD-10-CM | POA: Diagnosis not present

## 2015-11-03 DIAGNOSIS — Z8546 Personal history of malignant neoplasm of prostate: Secondary | ICD-10-CM | POA: Diagnosis not present

## 2015-11-03 DIAGNOSIS — W101XXA Fall (on)(from) sidewalk curb, initial encounter: Secondary | ICD-10-CM | POA: Insufficient documentation

## 2015-11-03 DIAGNOSIS — R040 Epistaxis: Secondary | ICD-10-CM | POA: Diagnosis not present

## 2015-11-03 DIAGNOSIS — S06310A Contusion and laceration of right cerebrum without loss of consciousness, initial encounter: Secondary | ICD-10-CM | POA: Diagnosis not present

## 2015-11-03 DIAGNOSIS — Z955 Presence of coronary angioplasty implant and graft: Secondary | ICD-10-CM | POA: Diagnosis not present

## 2015-11-03 DIAGNOSIS — S0590XA Unspecified injury of unspecified eye and orbit, initial encounter: Secondary | ICD-10-CM | POA: Diagnosis not present

## 2015-11-03 DIAGNOSIS — W19XXXA Unspecified fall, initial encounter: Secondary | ICD-10-CM | POA: Insufficient documentation

## 2015-11-03 DIAGNOSIS — S098XXA Other specified injuries of head, initial encounter: Secondary | ICD-10-CM | POA: Diagnosis not present

## 2015-11-03 DIAGNOSIS — Z8249 Family history of ischemic heart disease and other diseases of the circulatory system: Secondary | ICD-10-CM | POA: Diagnosis not present

## 2015-11-03 DIAGNOSIS — M542 Cervicalgia: Secondary | ICD-10-CM | POA: Diagnosis not present

## 2015-11-03 DIAGNOSIS — S0181XA Laceration without foreign body of other part of head, initial encounter: Secondary | ICD-10-CM | POA: Diagnosis not present

## 2015-11-03 DIAGNOSIS — Z79899 Other long term (current) drug therapy: Secondary | ICD-10-CM | POA: Diagnosis not present

## 2015-11-03 DIAGNOSIS — E785 Hyperlipidemia, unspecified: Secondary | ICD-10-CM | POA: Diagnosis not present

## 2015-11-03 DIAGNOSIS — Z87891 Personal history of nicotine dependence: Secondary | ICD-10-CM | POA: Diagnosis not present

## 2015-11-03 DIAGNOSIS — J45909 Unspecified asthma, uncomplicated: Secondary | ICD-10-CM | POA: Diagnosis not present

## 2015-11-03 DIAGNOSIS — Z7901 Long term (current) use of anticoagulants: Secondary | ICD-10-CM | POA: Diagnosis not present

## 2015-11-03 DIAGNOSIS — G4733 Obstructive sleep apnea (adult) (pediatric): Secondary | ICD-10-CM | POA: Diagnosis not present

## 2015-11-03 DIAGNOSIS — S064X0A Epidural hemorrhage without loss of consciousness, initial encounter: Secondary | ICD-10-CM | POA: Diagnosis not present

## 2015-11-03 DIAGNOSIS — Z7951 Long term (current) use of inhaled steroids: Secondary | ICD-10-CM | POA: Diagnosis not present

## 2015-11-03 DIAGNOSIS — Z7982 Long term (current) use of aspirin: Secondary | ICD-10-CM | POA: Diagnosis not present

## 2015-11-03 DIAGNOSIS — G2581 Restless legs syndrome: Secondary | ICD-10-CM | POA: Diagnosis not present

## 2015-11-03 DIAGNOSIS — S01111A Laceration without foreign body of right eyelid and periocular area, initial encounter: Secondary | ICD-10-CM | POA: Diagnosis not present

## 2015-11-03 DIAGNOSIS — I10 Essential (primary) hypertension: Secondary | ICD-10-CM | POA: Diagnosis not present

## 2015-11-03 DIAGNOSIS — E119 Type 2 diabetes mellitus without complications: Secondary | ICD-10-CM | POA: Diagnosis not present

## 2015-11-04 DIAGNOSIS — W101XXA Fall (on)(from) sidewalk curb, initial encounter: Secondary | ICD-10-CM | POA: Diagnosis not present

## 2015-11-04 DIAGNOSIS — S0181XA Laceration without foreign body of other part of head, initial encounter: Secondary | ICD-10-CM | POA: Diagnosis not present

## 2015-11-13 DIAGNOSIS — I2510 Atherosclerotic heart disease of native coronary artery without angina pectoris: Secondary | ICD-10-CM | POA: Diagnosis not present

## 2015-11-13 DIAGNOSIS — I482 Chronic atrial fibrillation: Secondary | ICD-10-CM | POA: Diagnosis not present

## 2015-11-13 DIAGNOSIS — I10 Essential (primary) hypertension: Secondary | ICD-10-CM | POA: Diagnosis not present

## 2015-11-13 DIAGNOSIS — I255 Ischemic cardiomyopathy: Secondary | ICD-10-CM | POA: Diagnosis not present

## 2015-11-13 DIAGNOSIS — E1165 Type 2 diabetes mellitus with hyperglycemia: Secondary | ICD-10-CM | POA: Diagnosis not present

## 2015-11-22 DIAGNOSIS — W101XXD Fall (on)(from) sidewalk curb, subsequent encounter: Secondary | ICD-10-CM | POA: Diagnosis not present

## 2015-11-22 DIAGNOSIS — S0292XD Unspecified fracture of facial bones, subsequent encounter for fracture with routine healing: Secondary | ICD-10-CM | POA: Diagnosis not present

## 2015-11-22 DIAGNOSIS — F329 Major depressive disorder, single episode, unspecified: Secondary | ICD-10-CM | POA: Diagnosis not present

## 2015-11-22 DIAGNOSIS — Y92512 Supermarket, store or market as the place of occurrence of the external cause: Secondary | ICD-10-CM | POA: Diagnosis not present

## 2016-01-01 DIAGNOSIS — I1310 Hypertensive heart and chronic kidney disease without heart failure, with stage 1 through stage 4 chronic kidney disease, or unspecified chronic kidney disease: Secondary | ICD-10-CM | POA: Diagnosis not present

## 2016-01-01 DIAGNOSIS — E1122 Type 2 diabetes mellitus with diabetic chronic kidney disease: Secondary | ICD-10-CM | POA: Diagnosis not present

## 2016-01-01 DIAGNOSIS — E1165 Type 2 diabetes mellitus with hyperglycemia: Secondary | ICD-10-CM | POA: Diagnosis not present

## 2016-01-01 DIAGNOSIS — N08 Glomerular disorders in diseases classified elsewhere: Secondary | ICD-10-CM | POA: Diagnosis not present

## 2016-01-01 DIAGNOSIS — N182 Chronic kidney disease, stage 2 (mild): Secondary | ICD-10-CM | POA: Diagnosis not present

## 2016-01-04 DIAGNOSIS — H02831 Dermatochalasis of right upper eyelid: Secondary | ICD-10-CM | POA: Diagnosis not present

## 2016-01-04 DIAGNOSIS — E119 Type 2 diabetes mellitus without complications: Secondary | ICD-10-CM | POA: Diagnosis not present

## 2016-01-04 DIAGNOSIS — H25813 Combined forms of age-related cataract, bilateral: Secondary | ICD-10-CM | POA: Diagnosis not present

## 2016-01-04 DIAGNOSIS — H40013 Open angle with borderline findings, low risk, bilateral: Secondary | ICD-10-CM | POA: Diagnosis not present

## 2016-01-04 DIAGNOSIS — H02834 Dermatochalasis of left upper eyelid: Secondary | ICD-10-CM | POA: Diagnosis not present

## 2016-01-08 DIAGNOSIS — S62667A Nondisplaced fracture of distal phalanx of left little finger, initial encounter for closed fracture: Secondary | ICD-10-CM | POA: Diagnosis not present

## 2016-01-08 DIAGNOSIS — Z7984 Long term (current) use of oral hypoglycemic drugs: Secondary | ICD-10-CM | POA: Diagnosis not present

## 2016-01-08 DIAGNOSIS — Z9989 Dependence on other enabling machines and devices: Secondary | ICD-10-CM | POA: Diagnosis not present

## 2016-01-08 DIAGNOSIS — I4891 Unspecified atrial fibrillation: Secondary | ICD-10-CM | POA: Diagnosis not present

## 2016-01-08 DIAGNOSIS — J45909 Unspecified asthma, uncomplicated: Secondary | ICD-10-CM | POA: Diagnosis not present

## 2016-01-08 DIAGNOSIS — Z87891 Personal history of nicotine dependence: Secondary | ICD-10-CM | POA: Diagnosis not present

## 2016-01-08 DIAGNOSIS — E079 Disorder of thyroid, unspecified: Secondary | ICD-10-CM | POA: Diagnosis not present

## 2016-01-08 DIAGNOSIS — S60222A Contusion of left hand, initial encounter: Secondary | ICD-10-CM | POA: Diagnosis not present

## 2016-01-08 DIAGNOSIS — G4733 Obstructive sleep apnea (adult) (pediatric): Secondary | ICD-10-CM | POA: Diagnosis not present

## 2016-01-08 DIAGNOSIS — S6992XA Unspecified injury of left wrist, hand and finger(s), initial encounter: Secondary | ICD-10-CM | POA: Diagnosis not present

## 2016-01-08 DIAGNOSIS — E119 Type 2 diabetes mellitus without complications: Secondary | ICD-10-CM | POA: Diagnosis not present

## 2016-01-08 DIAGNOSIS — E785 Hyperlipidemia, unspecified: Secondary | ICD-10-CM | POA: Diagnosis not present

## 2016-01-08 DIAGNOSIS — Z79899 Other long term (current) drug therapy: Secondary | ICD-10-CM | POA: Diagnosis not present

## 2016-01-08 DIAGNOSIS — Z7982 Long term (current) use of aspirin: Secondary | ICD-10-CM | POA: Diagnosis not present

## 2016-01-08 DIAGNOSIS — Z7901 Long term (current) use of anticoagulants: Secondary | ICD-10-CM | POA: Diagnosis not present

## 2016-01-19 DIAGNOSIS — R2232 Localized swelling, mass and lump, left upper limb: Secondary | ICD-10-CM | POA: Diagnosis not present

## 2016-01-19 DIAGNOSIS — Z09 Encounter for follow-up examination after completed treatment for conditions other than malignant neoplasm: Secondary | ICD-10-CM | POA: Diagnosis not present

## 2016-01-29 DIAGNOSIS — I255 Ischemic cardiomyopathy: Secondary | ICD-10-CM | POA: Diagnosis not present

## 2016-01-29 DIAGNOSIS — I10 Essential (primary) hypertension: Secondary | ICD-10-CM | POA: Diagnosis not present

## 2016-01-29 DIAGNOSIS — S60222A Contusion of left hand, initial encounter: Secondary | ICD-10-CM | POA: Diagnosis not present

## 2016-02-14 ENCOUNTER — Encounter: Payer: Self-pay | Admitting: Internal Medicine

## 2016-02-26 DIAGNOSIS — I255 Ischemic cardiomyopathy: Secondary | ICD-10-CM | POA: Diagnosis not present

## 2016-02-26 DIAGNOSIS — I10 Essential (primary) hypertension: Secondary | ICD-10-CM | POA: Diagnosis not present

## 2016-02-26 DIAGNOSIS — S60222A Contusion of left hand, initial encounter: Secondary | ICD-10-CM | POA: Diagnosis not present

## 2016-05-02 DIAGNOSIS — N182 Chronic kidney disease, stage 2 (mild): Secondary | ICD-10-CM | POA: Diagnosis not present

## 2016-05-02 DIAGNOSIS — E1122 Type 2 diabetes mellitus with diabetic chronic kidney disease: Secondary | ICD-10-CM | POA: Diagnosis not present

## 2016-05-02 DIAGNOSIS — I2584 Coronary atherosclerosis due to calcified coronary lesion: Secondary | ICD-10-CM | POA: Diagnosis not present

## 2016-05-02 DIAGNOSIS — N08 Glomerular disorders in diseases classified elsewhere: Secondary | ICD-10-CM | POA: Diagnosis not present

## 2016-05-16 DIAGNOSIS — R531 Weakness: Secondary | ICD-10-CM | POA: Diagnosis not present

## 2016-05-16 DIAGNOSIS — R296 Repeated falls: Secondary | ICD-10-CM | POA: Diagnosis not present

## 2016-05-20 DIAGNOSIS — R296 Repeated falls: Secondary | ICD-10-CM | POA: Diagnosis not present

## 2016-05-20 DIAGNOSIS — I482 Chronic atrial fibrillation: Secondary | ICD-10-CM | POA: Diagnosis not present

## 2016-05-20 DIAGNOSIS — I2510 Atherosclerotic heart disease of native coronary artery without angina pectoris: Secondary | ICD-10-CM | POA: Diagnosis not present

## 2016-05-20 DIAGNOSIS — I10 Essential (primary) hypertension: Secondary | ICD-10-CM | POA: Diagnosis not present

## 2016-05-20 DIAGNOSIS — E1165 Type 2 diabetes mellitus with hyperglycemia: Secondary | ICD-10-CM | POA: Diagnosis not present

## 2016-05-20 DIAGNOSIS — R531 Weakness: Secondary | ICD-10-CM | POA: Diagnosis not present

## 2016-05-20 DIAGNOSIS — I255 Ischemic cardiomyopathy: Secondary | ICD-10-CM | POA: Diagnosis not present

## 2016-05-27 DIAGNOSIS — R296 Repeated falls: Secondary | ICD-10-CM | POA: Diagnosis not present

## 2016-05-27 DIAGNOSIS — R531 Weakness: Secondary | ICD-10-CM | POA: Diagnosis not present

## 2016-05-30 DIAGNOSIS — R531 Weakness: Secondary | ICD-10-CM | POA: Diagnosis not present

## 2016-05-30 DIAGNOSIS — R296 Repeated falls: Secondary | ICD-10-CM | POA: Diagnosis not present

## 2016-06-03 DIAGNOSIS — K439 Ventral hernia without obstruction or gangrene: Secondary | ICD-10-CM | POA: Diagnosis not present

## 2016-06-03 DIAGNOSIS — I2510 Atherosclerotic heart disease of native coronary artery without angina pectoris: Secondary | ICD-10-CM | POA: Diagnosis not present

## 2016-06-03 DIAGNOSIS — K5900 Constipation, unspecified: Secondary | ICD-10-CM | POA: Diagnosis not present

## 2016-06-03 DIAGNOSIS — Z1211 Encounter for screening for malignant neoplasm of colon: Secondary | ICD-10-CM | POA: Diagnosis not present

## 2016-06-10 DIAGNOSIS — R531 Weakness: Secondary | ICD-10-CM | POA: Diagnosis not present

## 2016-06-10 DIAGNOSIS — R296 Repeated falls: Secondary | ICD-10-CM | POA: Diagnosis not present

## 2016-06-13 DIAGNOSIS — R531 Weakness: Secondary | ICD-10-CM | POA: Diagnosis not present

## 2016-06-13 DIAGNOSIS — R296 Repeated falls: Secondary | ICD-10-CM | POA: Diagnosis not present

## 2016-06-18 DIAGNOSIS — K635 Polyp of colon: Secondary | ICD-10-CM | POA: Diagnosis not present

## 2016-06-18 DIAGNOSIS — D122 Benign neoplasm of ascending colon: Secondary | ICD-10-CM | POA: Diagnosis not present

## 2016-06-18 DIAGNOSIS — D123 Benign neoplasm of transverse colon: Secondary | ICD-10-CM | POA: Diagnosis not present

## 2016-06-18 DIAGNOSIS — Z1211 Encounter for screening for malignant neoplasm of colon: Secondary | ICD-10-CM | POA: Diagnosis not present

## 2016-06-18 DIAGNOSIS — K5730 Diverticulosis of large intestine without perforation or abscess without bleeding: Secondary | ICD-10-CM | POA: Diagnosis not present

## 2016-07-10 DIAGNOSIS — E1165 Type 2 diabetes mellitus with hyperglycemia: Secondary | ICD-10-CM | POA: Diagnosis not present

## 2016-07-10 DIAGNOSIS — I255 Ischemic cardiomyopathy: Secondary | ICD-10-CM | POA: Diagnosis not present

## 2016-07-10 DIAGNOSIS — I10 Essential (primary) hypertension: Secondary | ICD-10-CM | POA: Diagnosis not present

## 2016-07-10 DIAGNOSIS — E782 Mixed hyperlipidemia: Secondary | ICD-10-CM | POA: Diagnosis not present

## 2016-07-10 DIAGNOSIS — E785 Hyperlipidemia, unspecified: Secondary | ICD-10-CM | POA: Diagnosis not present

## 2016-07-10 DIAGNOSIS — I2510 Atherosclerotic heart disease of native coronary artery without angina pectoris: Secondary | ICD-10-CM | POA: Diagnosis not present

## 2016-07-10 DIAGNOSIS — R079 Chest pain, unspecified: Secondary | ICD-10-CM | POA: Diagnosis not present

## 2016-07-16 DIAGNOSIS — R079 Chest pain, unspecified: Secondary | ICD-10-CM | POA: Diagnosis not present

## 2016-08-04 DIAGNOSIS — S299XXA Unspecified injury of thorax, initial encounter: Secondary | ICD-10-CM | POA: Diagnosis not present

## 2016-08-04 DIAGNOSIS — G4733 Obstructive sleep apnea (adult) (pediatric): Secondary | ICD-10-CM | POA: Diagnosis not present

## 2016-08-04 DIAGNOSIS — K5730 Diverticulosis of large intestine without perforation or abscess without bleeding: Secondary | ICD-10-CM | POA: Diagnosis not present

## 2016-08-04 DIAGNOSIS — R10811 Right upper quadrant abdominal tenderness: Secondary | ICD-10-CM | POA: Diagnosis not present

## 2016-08-04 DIAGNOSIS — Z7901 Long term (current) use of anticoagulants: Secondary | ICD-10-CM | POA: Diagnosis not present

## 2016-08-04 DIAGNOSIS — E079 Disorder of thyroid, unspecified: Secondary | ICD-10-CM | POA: Diagnosis not present

## 2016-08-04 DIAGNOSIS — Z79899 Other long term (current) drug therapy: Secondary | ICD-10-CM | POA: Diagnosis not present

## 2016-08-04 DIAGNOSIS — S20211A Contusion of right front wall of thorax, initial encounter: Secondary | ICD-10-CM | POA: Diagnosis not present

## 2016-08-04 DIAGNOSIS — E785 Hyperlipidemia, unspecified: Secondary | ICD-10-CM | POA: Diagnosis not present

## 2016-08-04 DIAGNOSIS — J9811 Atelectasis: Secondary | ICD-10-CM | POA: Diagnosis not present

## 2016-08-04 DIAGNOSIS — S301XXA Contusion of abdominal wall, initial encounter: Secondary | ICD-10-CM | POA: Diagnosis not present

## 2016-08-04 DIAGNOSIS — I4891 Unspecified atrial fibrillation: Secondary | ICD-10-CM | POA: Diagnosis not present

## 2016-08-04 DIAGNOSIS — G2581 Restless legs syndrome: Secondary | ICD-10-CM | POA: Diagnosis not present

## 2016-08-04 DIAGNOSIS — Z7982 Long term (current) use of aspirin: Secondary | ICD-10-CM | POA: Diagnosis not present

## 2016-08-04 DIAGNOSIS — S3991XA Unspecified injury of abdomen, initial encounter: Secondary | ICD-10-CM | POA: Diagnosis not present

## 2016-08-04 DIAGNOSIS — I10 Essential (primary) hypertension: Secondary | ICD-10-CM | POA: Diagnosis not present

## 2016-08-04 DIAGNOSIS — Z8546 Personal history of malignant neoplasm of prostate: Secondary | ICD-10-CM | POA: Diagnosis not present

## 2016-08-04 DIAGNOSIS — S3993XA Unspecified injury of pelvis, initial encounter: Secondary | ICD-10-CM | POA: Diagnosis not present

## 2016-08-04 DIAGNOSIS — E119 Type 2 diabetes mellitus without complications: Secondary | ICD-10-CM | POA: Diagnosis not present

## 2016-08-04 DIAGNOSIS — W01198A Fall on same level from slipping, tripping and stumbling with subsequent striking against other object, initial encounter: Secondary | ICD-10-CM | POA: Diagnosis not present

## 2016-08-04 DIAGNOSIS — Z7984 Long term (current) use of oral hypoglycemic drugs: Secondary | ICD-10-CM | POA: Diagnosis not present

## 2016-08-04 DIAGNOSIS — Z87891 Personal history of nicotine dependence: Secondary | ICD-10-CM | POA: Diagnosis not present

## 2016-08-04 DIAGNOSIS — Z955 Presence of coronary angioplasty implant and graft: Secondary | ICD-10-CM | POA: Diagnosis not present

## 2016-08-12 DIAGNOSIS — I1310 Hypertensive heart and chronic kidney disease without heart failure, with stage 1 through stage 4 chronic kidney disease, or unspecified chronic kidney disease: Secondary | ICD-10-CM | POA: Diagnosis not present

## 2016-08-12 DIAGNOSIS — E1122 Type 2 diabetes mellitus with diabetic chronic kidney disease: Secondary | ICD-10-CM | POA: Diagnosis not present

## 2016-08-12 DIAGNOSIS — N08 Glomerular disorders in diseases classified elsewhere: Secondary | ICD-10-CM | POA: Diagnosis not present

## 2016-08-12 DIAGNOSIS — N182 Chronic kidney disease, stage 2 (mild): Secondary | ICD-10-CM | POA: Diagnosis not present

## 2016-08-30 DIAGNOSIS — Z7982 Long term (current) use of aspirin: Secondary | ICD-10-CM | POA: Diagnosis not present

## 2016-08-30 DIAGNOSIS — I4891 Unspecified atrial fibrillation: Secondary | ICD-10-CM | POA: Diagnosis not present

## 2016-08-30 DIAGNOSIS — I10 Essential (primary) hypertension: Secondary | ICD-10-CM | POA: Diagnosis not present

## 2016-08-30 DIAGNOSIS — G2581 Restless legs syndrome: Secondary | ICD-10-CM | POA: Diagnosis not present

## 2016-08-30 DIAGNOSIS — E11618 Type 2 diabetes mellitus with other diabetic arthropathy: Secondary | ICD-10-CM | POA: Diagnosis not present

## 2016-08-30 DIAGNOSIS — Z8546 Personal history of malignant neoplasm of prostate: Secondary | ICD-10-CM | POA: Diagnosis not present

## 2016-08-30 DIAGNOSIS — Z87891 Personal history of nicotine dependence: Secondary | ICD-10-CM | POA: Diagnosis not present

## 2016-08-30 DIAGNOSIS — E785 Hyperlipidemia, unspecified: Secondary | ICD-10-CM | POA: Diagnosis not present

## 2016-08-30 DIAGNOSIS — G4733 Obstructive sleep apnea (adult) (pediatric): Secondary | ICD-10-CM | POA: Diagnosis not present

## 2016-08-30 DIAGNOSIS — Z79899 Other long term (current) drug therapy: Secondary | ICD-10-CM | POA: Diagnosis not present

## 2016-08-30 DIAGNOSIS — T24201A Burn of second degree of unspecified site of right lower limb, except ankle and foot, initial encounter: Secondary | ICD-10-CM | POA: Diagnosis not present

## 2016-08-30 DIAGNOSIS — Z7984 Long term (current) use of oral hypoglycemic drugs: Secondary | ICD-10-CM | POA: Diagnosis not present

## 2016-09-03 DIAGNOSIS — R350 Frequency of micturition: Secondary | ICD-10-CM | POA: Diagnosis not present

## 2016-09-03 DIAGNOSIS — I959 Hypotension, unspecified: Secondary | ICD-10-CM | POA: Diagnosis not present

## 2016-09-03 DIAGNOSIS — X17XXXS Contact with hot engines, machinery and tools, sequela: Secondary | ICD-10-CM | POA: Diagnosis not present

## 2016-09-03 DIAGNOSIS — C61 Malignant neoplasm of prostate: Secondary | ICD-10-CM | POA: Diagnosis not present

## 2016-09-03 DIAGNOSIS — N3941 Urge incontinence: Secondary | ICD-10-CM | POA: Diagnosis not present

## 2016-09-03 DIAGNOSIS — T24231S Burn of second degree of right lower leg, sequela: Secondary | ICD-10-CM | POA: Diagnosis not present

## 2016-09-03 DIAGNOSIS — L03115 Cellulitis of right lower limb: Secondary | ICD-10-CM | POA: Diagnosis not present

## 2016-09-03 DIAGNOSIS — R3911 Hesitancy of micturition: Secondary | ICD-10-CM | POA: Diagnosis not present

## 2016-09-06 DIAGNOSIS — I959 Hypotension, unspecified: Secondary | ICD-10-CM | POA: Diagnosis not present

## 2016-09-06 DIAGNOSIS — N08 Glomerular disorders in diseases classified elsewhere: Secondary | ICD-10-CM | POA: Diagnosis not present

## 2016-09-09 DIAGNOSIS — I255 Ischemic cardiomyopathy: Secondary | ICD-10-CM | POA: Diagnosis not present

## 2016-09-09 DIAGNOSIS — E1165 Type 2 diabetes mellitus with hyperglycemia: Secondary | ICD-10-CM | POA: Diagnosis not present

## 2016-09-09 DIAGNOSIS — I482 Chronic atrial fibrillation: Secondary | ICD-10-CM | POA: Diagnosis not present

## 2016-09-09 DIAGNOSIS — E782 Mixed hyperlipidemia: Secondary | ICD-10-CM | POA: Diagnosis not present

## 2016-09-09 DIAGNOSIS — I2510 Atherosclerotic heart disease of native coronary artery without angina pectoris: Secondary | ICD-10-CM | POA: Diagnosis not present

## 2016-09-09 DIAGNOSIS — R079 Chest pain, unspecified: Secondary | ICD-10-CM | POA: Diagnosis not present

## 2016-09-09 DIAGNOSIS — I10 Essential (primary) hypertension: Secondary | ICD-10-CM | POA: Diagnosis not present

## 2016-09-19 DIAGNOSIS — G2581 Restless legs syndrome: Secondary | ICD-10-CM | POA: Diagnosis not present

## 2016-09-19 DIAGNOSIS — E785 Hyperlipidemia, unspecified: Secondary | ICD-10-CM | POA: Diagnosis not present

## 2016-09-19 DIAGNOSIS — Z7982 Long term (current) use of aspirin: Secondary | ICD-10-CM | POA: Diagnosis not present

## 2016-09-19 DIAGNOSIS — Z87891 Personal history of nicotine dependence: Secondary | ICD-10-CM | POA: Diagnosis not present

## 2016-09-19 DIAGNOSIS — Z955 Presence of coronary angioplasty implant and graft: Secondary | ICD-10-CM | POA: Diagnosis not present

## 2016-09-19 DIAGNOSIS — Z8546 Personal history of malignant neoplasm of prostate: Secondary | ICD-10-CM | POA: Diagnosis not present

## 2016-09-19 DIAGNOSIS — I4891 Unspecified atrial fibrillation: Secondary | ICD-10-CM | POA: Diagnosis not present

## 2016-09-19 DIAGNOSIS — I10 Essential (primary) hypertension: Secondary | ICD-10-CM | POA: Diagnosis not present

## 2016-09-19 DIAGNOSIS — Z79899 Other long term (current) drug therapy: Secondary | ICD-10-CM | POA: Diagnosis not present

## 2016-09-19 DIAGNOSIS — J45909 Unspecified asthma, uncomplicated: Secondary | ICD-10-CM | POA: Diagnosis not present

## 2016-09-19 DIAGNOSIS — E890 Postprocedural hypothyroidism: Secondary | ICD-10-CM | POA: Diagnosis not present

## 2016-09-19 DIAGNOSIS — Z7901 Long term (current) use of anticoagulants: Secondary | ICD-10-CM | POA: Diagnosis not present

## 2016-09-19 DIAGNOSIS — R300 Dysuria: Secondary | ICD-10-CM | POA: Diagnosis not present

## 2016-09-19 DIAGNOSIS — G4733 Obstructive sleep apnea (adult) (pediatric): Secondary | ICD-10-CM | POA: Diagnosis not present

## 2016-09-19 DIAGNOSIS — E11618 Type 2 diabetes mellitus with other diabetic arthropathy: Secondary | ICD-10-CM | POA: Diagnosis not present

## 2016-09-19 DIAGNOSIS — R339 Retention of urine, unspecified: Secondary | ICD-10-CM | POA: Diagnosis not present

## 2016-09-27 DIAGNOSIS — R350 Frequency of micturition: Secondary | ICD-10-CM | POA: Diagnosis not present

## 2016-10-24 DIAGNOSIS — Z0000 Encounter for general adult medical examination without abnormal findings: Secondary | ICD-10-CM | POA: Diagnosis not present

## 2016-10-24 DIAGNOSIS — N182 Chronic kidney disease, stage 2 (mild): Secondary | ICD-10-CM | POA: Diagnosis not present

## 2016-10-24 DIAGNOSIS — I1310 Hypertensive heart and chronic kidney disease without heart failure, with stage 1 through stage 4 chronic kidney disease, or unspecified chronic kidney disease: Secondary | ICD-10-CM | POA: Diagnosis not present

## 2016-10-24 DIAGNOSIS — E1122 Type 2 diabetes mellitus with diabetic chronic kidney disease: Secondary | ICD-10-CM | POA: Diagnosis not present

## 2016-10-24 DIAGNOSIS — Z1389 Encounter for screening for other disorder: Secondary | ICD-10-CM | POA: Diagnosis not present

## 2016-10-24 DIAGNOSIS — E559 Vitamin D deficiency, unspecified: Secondary | ICD-10-CM | POA: Diagnosis not present

## 2016-10-24 DIAGNOSIS — I2584 Coronary atherosclerosis due to calcified coronary lesion: Secondary | ICD-10-CM | POA: Diagnosis not present

## 2016-11-01 DIAGNOSIS — T8130XS Disruption of wound, unspecified, sequela: Secondary | ICD-10-CM | POA: Diagnosis not present

## 2016-11-07 DIAGNOSIS — T8130XS Disruption of wound, unspecified, sequela: Secondary | ICD-10-CM | POA: Diagnosis not present

## 2017-01-07 DIAGNOSIS — E119 Type 2 diabetes mellitus without complications: Secondary | ICD-10-CM | POA: Diagnosis not present

## 2017-01-07 DIAGNOSIS — H02831 Dermatochalasis of right upper eyelid: Secondary | ICD-10-CM | POA: Diagnosis not present

## 2017-01-07 DIAGNOSIS — H02834 Dermatochalasis of left upper eyelid: Secondary | ICD-10-CM | POA: Diagnosis not present

## 2017-01-07 DIAGNOSIS — H25813 Combined forms of age-related cataract, bilateral: Secondary | ICD-10-CM | POA: Diagnosis not present

## 2017-01-07 DIAGNOSIS — H40013 Open angle with borderline findings, low risk, bilateral: Secondary | ICD-10-CM | POA: Diagnosis not present

## 2017-01-20 DIAGNOSIS — H25811 Combined forms of age-related cataract, right eye: Secondary | ICD-10-CM | POA: Diagnosis not present

## 2017-01-20 DIAGNOSIS — H2511 Age-related nuclear cataract, right eye: Secondary | ICD-10-CM | POA: Diagnosis not present

## 2017-01-23 DIAGNOSIS — I1310 Hypertensive heart and chronic kidney disease without heart failure, with stage 1 through stage 4 chronic kidney disease, or unspecified chronic kidney disease: Secondary | ICD-10-CM | POA: Diagnosis not present

## 2017-01-23 DIAGNOSIS — N182 Chronic kidney disease, stage 2 (mild): Secondary | ICD-10-CM | POA: Diagnosis not present

## 2017-01-23 DIAGNOSIS — J209 Acute bronchitis, unspecified: Secondary | ICD-10-CM | POA: Diagnosis not present

## 2017-01-23 DIAGNOSIS — N08 Glomerular disorders in diseases classified elsewhere: Secondary | ICD-10-CM | POA: Diagnosis not present

## 2017-01-23 DIAGNOSIS — E1122 Type 2 diabetes mellitus with diabetic chronic kidney disease: Secondary | ICD-10-CM | POA: Diagnosis not present

## 2017-01-28 DIAGNOSIS — H2512 Age-related nuclear cataract, left eye: Secondary | ICD-10-CM | POA: Diagnosis not present

## 2017-02-03 DIAGNOSIS — H2512 Age-related nuclear cataract, left eye: Secondary | ICD-10-CM | POA: Diagnosis not present

## 2017-02-03 DIAGNOSIS — H268 Other specified cataract: Secondary | ICD-10-CM | POA: Diagnosis not present

## 2017-02-04 DIAGNOSIS — J4521 Mild intermittent asthma with (acute) exacerbation: Secondary | ICD-10-CM | POA: Diagnosis not present

## 2017-02-04 DIAGNOSIS — R918 Other nonspecific abnormal finding of lung field: Secondary | ICD-10-CM | POA: Diagnosis not present

## 2017-02-04 DIAGNOSIS — J301 Allergic rhinitis due to pollen: Secondary | ICD-10-CM | POA: Diagnosis not present

## 2017-02-04 DIAGNOSIS — G4733 Obstructive sleep apnea (adult) (pediatric): Secondary | ICD-10-CM | POA: Diagnosis not present

## 2017-02-04 DIAGNOSIS — R05 Cough: Secondary | ICD-10-CM | POA: Diagnosis not present

## 2017-02-18 DIAGNOSIS — J4520 Mild intermittent asthma, uncomplicated: Secondary | ICD-10-CM | POA: Diagnosis not present

## 2017-02-18 DIAGNOSIS — G4733 Obstructive sleep apnea (adult) (pediatric): Secondary | ICD-10-CM | POA: Diagnosis not present

## 2017-02-18 DIAGNOSIS — J301 Allergic rhinitis due to pollen: Secondary | ICD-10-CM | POA: Diagnosis not present

## 2017-02-18 DIAGNOSIS — R5383 Other fatigue: Secondary | ICD-10-CM | POA: Diagnosis not present

## 2017-02-19 DIAGNOSIS — G4733 Obstructive sleep apnea (adult) (pediatric): Secondary | ICD-10-CM | POA: Diagnosis not present

## 2017-03-05 DIAGNOSIS — L03115 Cellulitis of right lower limb: Secondary | ICD-10-CM | POA: Diagnosis not present

## 2017-03-05 DIAGNOSIS — R609 Edema, unspecified: Secondary | ICD-10-CM | POA: Diagnosis not present

## 2017-03-05 DIAGNOSIS — L03116 Cellulitis of left lower limb: Secondary | ICD-10-CM | POA: Diagnosis not present

## 2017-03-06 DIAGNOSIS — G4733 Obstructive sleep apnea (adult) (pediatric): Secondary | ICD-10-CM | POA: Diagnosis not present

## 2017-03-06 DIAGNOSIS — J4520 Mild intermittent asthma, uncomplicated: Secondary | ICD-10-CM | POA: Diagnosis not present

## 2017-03-06 DIAGNOSIS — R918 Other nonspecific abnormal finding of lung field: Secondary | ICD-10-CM | POA: Diagnosis not present

## 2017-03-17 ENCOUNTER — Ambulatory Visit (HOSPITAL_COMMUNITY)
Admission: RE | Admit: 2017-03-17 | Discharge: 2017-03-17 | Disposition: A | Discharge status: Home / Self Care | Payer: Medicare Other | Source: Ambulatory Visit | Attending: Vascular Surgery | Admitting: Vascular Surgery

## 2017-03-17 ENCOUNTER — Other Ambulatory Visit (HOSPITAL_COMMUNITY): Payer: Self-pay | Admitting: Nurse Practitioner

## 2017-03-17 DIAGNOSIS — R600 Localized edema: Secondary | ICD-10-CM

## 2017-03-19 DIAGNOSIS — R609 Edema, unspecified: Secondary | ICD-10-CM | POA: Diagnosis not present

## 2017-03-20 DIAGNOSIS — R918 Other nonspecific abnormal finding of lung field: Secondary | ICD-10-CM | POA: Diagnosis not present

## 2017-03-20 DIAGNOSIS — G4733 Obstructive sleep apnea (adult) (pediatric): Secondary | ICD-10-CM | POA: Diagnosis not present

## 2017-03-20 DIAGNOSIS — J4520 Mild intermittent asthma, uncomplicated: Secondary | ICD-10-CM | POA: Diagnosis not present

## 2017-03-24 ENCOUNTER — Other Ambulatory Visit (HOSPITAL_COMMUNITY): Payer: Self-pay | Admitting: Nurse Practitioner

## 2017-03-24 ENCOUNTER — Other Ambulatory Visit (HOSPITAL_COMMUNITY): Payer: Self-pay | Admitting: Internal Medicine

## 2017-03-24 ENCOUNTER — Ambulatory Visit (HOSPITAL_COMMUNITY)
Admission: RE | Admit: 2017-03-24 | Discharge: 2017-03-24 | Disposition: A | Discharge status: Home / Self Care | Payer: Medicare Other | Source: Ambulatory Visit | Attending: Surgery | Admitting: Surgery

## 2017-03-24 DIAGNOSIS — I739 Peripheral vascular disease, unspecified: Secondary | ICD-10-CM

## 2017-03-24 DIAGNOSIS — J984 Other disorders of lung: Secondary | ICD-10-CM | POA: Diagnosis not present

## 2017-03-24 DIAGNOSIS — I2510 Atherosclerotic heart disease of native coronary artery without angina pectoris: Secondary | ICD-10-CM | POA: Diagnosis not present

## 2017-03-24 DIAGNOSIS — R918 Other nonspecific abnormal finding of lung field: Secondary | ICD-10-CM | POA: Diagnosis not present

## 2017-03-24 DIAGNOSIS — Z955 Presence of coronary angioplasty implant and graft: Secondary | ICD-10-CM | POA: Diagnosis not present

## 2017-04-17 DIAGNOSIS — R05 Cough: Secondary | ICD-10-CM | POA: Diagnosis not present

## 2017-04-17 DIAGNOSIS — R5383 Other fatigue: Secondary | ICD-10-CM | POA: Diagnosis not present

## 2017-04-17 DIAGNOSIS — J4520 Mild intermittent asthma, uncomplicated: Secondary | ICD-10-CM | POA: Diagnosis not present

## 2017-04-17 DIAGNOSIS — J301 Allergic rhinitis due to pollen: Secondary | ICD-10-CM | POA: Diagnosis not present

## 2017-04-17 DIAGNOSIS — G4733 Obstructive sleep apnea (adult) (pediatric): Secondary | ICD-10-CM | POA: Diagnosis not present

## 2017-04-17 DIAGNOSIS — R918 Other nonspecific abnormal finding of lung field: Secondary | ICD-10-CM | POA: Diagnosis not present

## 2017-04-28 ENCOUNTER — Ambulatory Visit (INDEPENDENT_AMBULATORY_CARE_PROVIDER_SITE_OTHER): Payer: Medicare Other | Admitting: Vascular Surgery

## 2017-04-28 ENCOUNTER — Encounter: Payer: Self-pay | Admitting: Vascular Surgery

## 2017-04-28 ENCOUNTER — Other Ambulatory Visit: Payer: Self-pay

## 2017-04-28 VITALS — BP 119/63 | HR 64 | Temp 97.4°F | Resp 18 | Ht 71.0 in | Wt 247.0 lb

## 2017-04-28 DIAGNOSIS — I83893 Varicose veins of bilateral lower extremities with other complications: Secondary | ICD-10-CM | POA: Diagnosis not present
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
## 2017-05-01 DIAGNOSIS — E1122 Type 2 diabetes mellitus with diabetic chronic kidney disease: Secondary | ICD-10-CM | POA: Diagnosis not present

## 2017-05-01 DIAGNOSIS — N182 Chronic kidney disease, stage 2 (mild): Secondary | ICD-10-CM | POA: Diagnosis not present

## 2017-05-01 DIAGNOSIS — I1310 Hypertensive heart and chronic kidney disease without heart failure, with stage 1 through stage 4 chronic kidney disease, or unspecified chronic kidney disease: Secondary | ICD-10-CM | POA: Diagnosis not present

## 2017-05-01 DIAGNOSIS — N08 Glomerular disorders in diseases classified elsewhere: Secondary | ICD-10-CM | POA: Diagnosis not present

## 2017-05-05 DIAGNOSIS — J00 Acute nasopharyngitis [common cold]: Secondary | ICD-10-CM | POA: Diagnosis not present

## 2017-05-05 DIAGNOSIS — Z9889 Other specified postprocedural states: Secondary | ICD-10-CM | POA: Diagnosis not present

## 2017-05-05 DIAGNOSIS — R490 Dysphonia: Secondary | ICD-10-CM | POA: Diagnosis not present

## 2017-05-14 DIAGNOSIS — R0689 Other abnormalities of breathing: Secondary | ICD-10-CM | POA: Diagnosis not present

## 2017-05-14 DIAGNOSIS — J209 Acute bronchitis, unspecified: Secondary | ICD-10-CM | POA: Diagnosis not present

## 2017-05-21 DIAGNOSIS — J301 Allergic rhinitis due to pollen: Secondary | ICD-10-CM | POA: Diagnosis not present

## 2017-05-21 DIAGNOSIS — R05 Cough: Secondary | ICD-10-CM | POA: Diagnosis not present

## 2017-05-21 DIAGNOSIS — R918 Other nonspecific abnormal finding of lung field: Secondary | ICD-10-CM | POA: Diagnosis not present

## 2017-05-21 DIAGNOSIS — G4733 Obstructive sleep apnea (adult) (pediatric): Secondary | ICD-10-CM | POA: Diagnosis not present

## 2017-05-21 DIAGNOSIS — J4520 Mild intermittent asthma, uncomplicated: Secondary | ICD-10-CM | POA: Diagnosis not present

## 2017-05-21 DIAGNOSIS — R5383 Other fatigue: Secondary | ICD-10-CM | POA: Diagnosis not present

## 2017-07-23 DIAGNOSIS — I255 Ischemic cardiomyopathy: Secondary | ICD-10-CM | POA: Diagnosis not present

## 2017-07-23 DIAGNOSIS — I482 Chronic atrial fibrillation: Secondary | ICD-10-CM | POA: Diagnosis not present

## 2017-07-23 DIAGNOSIS — E1165 Type 2 diabetes mellitus with hyperglycemia: Secondary | ICD-10-CM | POA: Diagnosis not present

## 2017-07-23 DIAGNOSIS — I10 Essential (primary) hypertension: Secondary | ICD-10-CM | POA: Diagnosis not present

## 2017-07-23 DIAGNOSIS — I2510 Atherosclerotic heart disease of native coronary artery without angina pectoris: Secondary | ICD-10-CM | POA: Diagnosis not present

## 2017-07-28 ENCOUNTER — Other Ambulatory Visit: Payer: Self-pay

## 2017-07-28 ENCOUNTER — Encounter: Payer: Self-pay | Admitting: Vascular Surgery

## 2017-07-28 ENCOUNTER — Ambulatory Visit (INDEPENDENT_AMBULATORY_CARE_PROVIDER_SITE_OTHER): Payer: Medicare Other | Admitting: Vascular Surgery

## 2017-07-28 VITALS — BP 127/77 | HR 62 | Temp 97.1°F | Resp 18 | Ht 71.0 in | Wt 244.0 lb

## 2017-07-28 DIAGNOSIS — I83893 Varicose veins of bilateral lower extremities with other complications: Secondary | ICD-10-CM

## 2017-07-28 NOTE — Progress Notes (Signed)
Subjective:     Patient ID: Franklin Thornton, male   DOB: [birth year], 72 y.o.   MRN: [id number]  HPI This 72 year old male returns for continued follow-up regarding his bilateral lower extremity edema and skin changes with aching and throbbing discomfort due to gross reflux left great saphenous vein worse than right great saphenous vein.  He is tried long-leg elastic compression stockings 20-30 mm gradient as well as elevation and ibuprofen but continues to have the same symptoms and the severe skin changes.  He has no active ulcer in the left leg at the present time but has had an ulcer on the right.  This is affecting his daily living and he would like treatment to prevent further ulceration and further skin changes.  He is on chronic anticoagulation for his A. fib  Past Medical History:  Diagnosis Date  . Atrial fibrillation ([place])   . Diabetes mellitus without complication ([place])   . Peripheral vascular disease ([place])   . Prostate cancer [facility])     Social History   Tobacco Use  . Smoking status: Former Smoker    Packs/day: 2.00    Years: 4.00    Pack years: 8.00    Types: Cigarettes    Last attempt to quit: 08/13/1965    Years since quitting: 51.9  . Smokeless tobacco: Never Used  Substance Use Topics  . Alcohol use: Yes    Alcohol/week: 0.6 oz    Types: 1 Standard drinks or equivalent per week    No family history on file.  Allergies  Allergen Reactions  . No Known Allergies      Current Outpatient Medications:  .  albuterol (PROVENTIL HFA;VENTOLIN HFA) 108 (90 Base) MCG/ACT inhaler, Inhale 2 puffs into the lungs every 6 (six) hours as needed for wheezing or shortness of breath., Disp: , Rfl:  .  amiodarone (PACERONE) 200 MG tablet, Take 200 mg by mouth daily., Disp: , Rfl:  .  aspirin 81 MG tablet, Take 81 mg by mouth daily., Disp: , Rfl:  .  atorvastatin (LIPITOR) 20 MG tablet, Take 20 mg by mouth daily., Disp: , Rfl:  .  b complex vitamins capsule, Take 1 capsule by mouth  daily., Disp: , Rfl:  .  beclomethasone (QVAR) 80 MCG/ACT inhaler, Inhale 2 puffs into the lungs 2 (two) times daily., Disp: , Rfl:  .  carvedilol (COREG) 6.25 MG tablet, Take 6.25 mg by mouth 2 (two) times daily with a meal., Disp: , Rfl:  .  Cholecalciferol (VITAMIN D) 2000 units tablet, Take 2,000 Units by mouth daily., Disp: , Rfl:  .  dapagliflozin propanediol (FARXIGA) 10 MG TABS tablet, Take 10 mg by mouth daily., Disp: , Rfl:  .  DULoxetine (CYMBALTA) 30 MG capsule, Take 30 mg by mouth daily., Disp: , Rfl:  .  furosemide (LASIX) 20 MG tablet, Take 20 mg by mouth daily., Disp: , Rfl:  .  HYDROcodone-acetaminophen (NORCO/VICODIN) 5-325 MG tablet, Take 1 tablet by mouth every 6 (six) hours as needed for moderate pain., Disp: , Rfl:  .  ipratropium (ATROVENT) 0.03 % nasal spray, Place 2 sprays into both nostrils every 12 (twelve) hours., Disp: , Rfl:  .  levocetirizine (XYZAL) 5 MG tablet, Take 5 mg by mouth every evening., Disp: , Rfl:  .  levothyroxine (SYNTHROID, LEVOTHROID) 50 MCG tablet, Take 50 mcg by mouth daily before breakfast., Disp: , Rfl:  .  lisinopril (PRINIVIL,ZESTRIL) 10 MG tablet, Take 10 mg by mouth daily., Disp: , Rfl:  .  Magnesium 250 MG TABS, Take 1 tablet by mouth daily., Disp: , Rfl:  .  mirabegron ER (MYRBETRIQ) 50 MG TB24 tablet, Take 50 mg by mouth daily., Disp: , Rfl:  .  Multiple Vitamin (MULTIVITAMIN) tablet, Take 1 tablet by mouth daily., Disp: , Rfl:  .  niacin (NIASPAN) 500 MG CR tablet, Take 500 mg by mouth at bedtime., Disp: , Rfl:  .  nitroGLYCERIN (NITROSTAT) 0.4 MG SL tablet, Place 0.4 mg under the tongue every 5 (five) minutes as needed for chest pain., Disp: , Rfl:  .  Omega-3 Fatty Acids (FISH OIL) 1200 MG CAPS, Take 1 capsule by mouth daily., Disp: , Rfl:  .  pantoprazole (PROTONIX) 40 MG tablet, Take 40 mg by mouth daily., Disp: , Rfl:  .  pramipexole (MIRAPEX) 0.5 MG tablet, Take 0.5 mg by mouth 3 (three) times daily., Disp: , Rfl:  .  rivaroxaban  (XARELTO) 10 MG TABS tablet, Take 10 mg by mouth daily., Disp: , Rfl:  .  sitaGLIPtin-metformin (JANUMET) 50-500 MG tablet, Take 1 tablet by mouth 2 (two) times daily with a meal., Disp: , Rfl:  .  solifenacin (VESICARE) 5 MG tablet, Take 5 mg by mouth daily., Disp: , Rfl:  .  vitamin B-12 (CYANOCOBALAMIN) 1000 MCG tablet, Take 1,000 mcg by mouth daily., Disp: , Rfl:   There were no vitals filed for this visit.  There is no height or weight on file to calculate BMI.         Review of Systems Denies chest pain, PND, orthopnea, hemoptysis -has mild dyspnea on exertion.  Has history of A. fib Objective:   Physical Exam BP 127/77 (BP Location: Left Arm, Patient Position: Sitting, Cuff Size: Large)   Pulse 62   Temp (!) 97.1 F (36.2 C) (Oral)   Resp 18   Ht 5\' 11"  (1.803 m)   Wt 244 lb (110.7 kg)   SpO2 95%   BMI 34.03 kg/m   Obese male no apparent distress alert and oriented x3 Lungs no rhonchi or wheezing Left leg with moderately severe hyperpigmentation lower third with no active ulceration.  Chronic 1+ edema.  Reticular veins in lower third of leg toward malleoli.  2+ dorsalis pedis pulse palpable.  Today I reviewed the formal venous reflux exam and performed a bedside SonoSite ultrasound exam Patient does have an enlarged left great saphenous vein with gross reflux throughout The right great saphenous vein is slightly smaller in caliber with some reflux as well     Assessment:     Pain and swelling bilateral lower extremities left worse than right with moderately severe skin changes but no active ulceration on the left, history of ulceration on the right which is currently healed.  This is causing symptoms which are affecting patient's daily living and he is concerned about the progressive skin changes CEAP4    Plan:     Patient needs laser ablation left great saphenous vein hopefully improve edema and stabilize skin changes Would not recommend treating right great  saphenous vein at the present time because of patient's history of coronary artery disease with previous cardiac stents a few years ago He may require this in the future We will proceed with precertification to perform this in the near future

## 2017-07-29 DIAGNOSIS — I10 Essential (primary) hypertension: Secondary | ICD-10-CM | POA: Diagnosis not present

## 2017-07-29 DIAGNOSIS — E1165 Type 2 diabetes mellitus with hyperglycemia: Secondary | ICD-10-CM | POA: Diagnosis not present

## 2017-07-29 DIAGNOSIS — I482 Chronic atrial fibrillation: Secondary | ICD-10-CM | POA: Diagnosis not present

## 2017-07-29 DIAGNOSIS — I517 Cardiomegaly: Secondary | ICD-10-CM | POA: Diagnosis not present

## 2017-07-29 DIAGNOSIS — I2510 Atherosclerotic heart disease of native coronary artery without angina pectoris: Secondary | ICD-10-CM | POA: Diagnosis not present

## 2017-07-29 DIAGNOSIS — I255 Ischemic cardiomyopathy: Secondary | ICD-10-CM | POA: Diagnosis not present

## 2017-07-30 ENCOUNTER — Other Ambulatory Visit: Payer: Self-pay | Admitting: *Deleted

## 2017-07-30 DIAGNOSIS — I83892 Varicose veins of left lower extremities with other complications: Secondary | ICD-10-CM

## 2017-08-07 DIAGNOSIS — J301 Allergic rhinitis due to pollen: Secondary | ICD-10-CM | POA: Diagnosis not present

## 2017-08-07 DIAGNOSIS — R5383 Other fatigue: Secondary | ICD-10-CM | POA: Diagnosis not present

## 2017-08-07 DIAGNOSIS — R918 Other nonspecific abnormal finding of lung field: Secondary | ICD-10-CM | POA: Diagnosis not present

## 2017-08-07 DIAGNOSIS — J4520 Mild intermittent asthma, uncomplicated: Secondary | ICD-10-CM | POA: Diagnosis not present

## 2017-08-19 ENCOUNTER — Ambulatory Visit (INDEPENDENT_AMBULATORY_CARE_PROVIDER_SITE_OTHER): Payer: Medicare Other | Admitting: Vascular Surgery

## 2017-08-19 ENCOUNTER — Encounter: Payer: Self-pay | Admitting: Vascular Surgery

## 2017-08-19 VITALS — BP 106/56 | HR 56 | Temp 97.0°F | Resp 16 | Ht 71.0 in | Wt 242.0 lb

## 2017-08-19 DIAGNOSIS — I83892 Varicose veins of left lower extremities with other complications: Secondary | ICD-10-CM | POA: Diagnosis not present

## 2017-08-19 DIAGNOSIS — I868 Varicose veins of other specified sites: Secondary | ICD-10-CM

## 2017-08-19 HISTORY — PX: ENDOVENOUS ABLATION SAPHENOUS VEIN W/ LASER: SUR449

## 2017-08-19 NOTE — Progress Notes (Signed)
  Subjective:     Patient ID: Franklin Thornton, male   DOB: 1945-06-18, 72 y.o.   MRN: [id number]  HPI This 72 year old male had laser ablation of the left great saphenous vein from the mid calf to near the saphenofemoral junction performed under local tumescent anesthesia.  A total of 3121 J of energy was utilized.  He tolerated the procedure well.  Review of Systems     Objective:   Physical Exam  BP (!) 106/56 (BP Location: Left Arm, Patient Position: Sitting, Cuff Size: Normal)   Pulse (!) 56   Temp (!) 97 F (36.1 C) (Oral)   Resp 16   Ht 5\' 11"  (1.803 m)   Wt 242 lb (109.8 kg)   SpO2 96%   BMI 33.75 kg/m       Assessment:     Well-tolerated laser ablation left great saphenous vein performed under local tumescent anesthesia    Plan:     Return in 1 week for venous duplex exam to confirm closure left great saphenous vein Patient will then return in 3 months to be evaluated for possible stab phlebectomy of painful varicosities

## 2017-08-19 NOTE — Progress Notes (Signed)
Laser Ablation Procedure    Date: 08/19/2017   Franklin Thornton DOB:April 04, 1946  Consent signed: Yes    Surgeon:  Dr. Nelda Severe. Kellie Simmering  Procedure: Laser Ablation: left Greater Saphenous Vein  BP (!) 106/56 (BP Location: Left Arm, Patient Position: Sitting, Cuff Size: Normal)   Pulse (!) 56   Temp (!) 97 F (36.1 C) (Oral)   Resp 16   Ht 5\' 11"  (1.803 m)   Wt 242 lb (109.8 kg)   SpO2 96%   BMI 33.75 kg/m   Tumescent Anesthesia: 450 cc 0.9% NaCl with 50 cc Lidocaine HCL 1% and 15 cc 8.4% NaHCO3  Local Anesthesia: 6 cc Lidocaine HCL and NaHCO3 (ratio 2:1)  Pulsed Mode: 15 watts, 54ms delay, 1.0 duration  Total Energy: 3121 Joules             Total Pulses:  209              Total Time: 3:28     Patient tolerated procedure well  Notes: Last dose of Xarelto 08-16-2017.  Description of Procedure:  After marking the course of the secondary varicosities, the patient was placed on the operating table in the supine position, and the left leg was prepped and draped in sterile fashion.   Local anesthetic was administered and under ultrasound guidance the saphenous vein was accessed with a micro needle and guide wire; then the mirco puncture sheath was placed.  A guide wire was inserted saphenofemoral junction , followed by a 5 french sheath.  The position of the sheath and then the laser fiber below the junction was confirmed using the ultrasound.  Tumescent anesthesia was administered along the course of the saphenous vein using ultrasound guidance. The patient was placed in Trendelenburg position and protective laser glasses were placed on patient and staff, and the laser was fired at 15 watts continuous mode advancing 1-73mm/second for a total of 3121 joules.    Steri strip was applied to the IV insertion site  and ABD pads and thigh high compression stockings were applied.  Ace wrap bandages were applied over the left calf and thigh and at the top of the saphenofemoral junction. Blood loss  was less than 15 cc.  The patient ambulated out of the operating room having tolerated the procedure well.

## 2017-08-28 ENCOUNTER — Other Ambulatory Visit: Payer: Self-pay

## 2017-08-28 ENCOUNTER — Ambulatory Visit (INDEPENDENT_AMBULATORY_CARE_PROVIDER_SITE_OTHER): Payer: Medicare Other | Admitting: Vascular Surgery

## 2017-08-28 ENCOUNTER — Ambulatory Visit (HOSPITAL_COMMUNITY)
Admission: RE | Admit: 2017-08-28 | Discharge: 2017-08-28 | Disposition: A | Discharge status: Home / Self Care | Payer: Medicare Other | Source: Ambulatory Visit | Attending: Vascular Surgery | Admitting: Vascular Surgery

## 2017-08-28 ENCOUNTER — Encounter: Payer: Self-pay | Admitting: Vascular Surgery

## 2017-08-28 VITALS — BP 138/81 | HR 58 | Temp 97.5°F | Resp 16 | Ht 71.0 in | Wt 237.0 lb

## 2017-08-28 DIAGNOSIS — I83892 Varicose veins of left lower extremities with other complications: Secondary | ICD-10-CM | POA: Diagnosis not present

## 2017-08-28 NOTE — Progress Notes (Signed)
[facility] [place]  Patient name: Franklin Thornton MRN: [id number] DOB: 12-04-1945 Sex: male  REASON FOR VISIT: One week follow-up laser ablation left great saphenous vein  HPI: Franklin Thornton is a 72 y.o. male here today for follow-up.  Underwent laser ablation of left great saphenous vein with Dr. Kellie Simmering on 07/23/2017.  He has done extremely well.  He has been compliant with his compression garments and reports minimal discomfort.  He has resumed his anticoagulation for chronic atrial fibrillation  Past Medical History:  Diagnosis Date  . Atrial fibrillation ([place])   . Diabetes mellitus without complication ([place])   . Peripheral vascular disease ([place])   . Prostate cancer [facility])     History reviewed. No pertinent family history.  SOCIAL HISTORY: Social History   Tobacco Use  . Smoking status: Former Smoker    Packs/day: 2.00    Years: 4.00    Pack years: 8.00    Types: Cigarettes    Last attempt to quit: 08/13/1965    Years since quitting: 52.0  . Smokeless tobacco: Never Used  Substance Use Topics  . Alcohol use: Yes    Alcohol/week: 0.6 oz    Types: 1 Standard drinks or equivalent per week    Allergies  Allergen Reactions  . No Known Allergies     Current Outpatient Medications  Medication Sig Dispense Refill  . albuterol (PROVENTIL HFA;VENTOLIN HFA) 108 (90 Base) MCG/ACT inhaler Inhale 2 puffs into the lungs every 6 (six) hours as needed for wheezing or shortness of breath.    [name] amiodarone (PACERONE) 200 MG tablet Take 200 mg by mouth daily.    [name] aspirin 81 MG tablet Take 81 mg by mouth daily.    [name] atorvastatin (LIPITOR) 20 MG tablet Take 20 mg by mouth daily.    [name] b complex vitamins capsule Take 1 capsule by mouth daily.    . beclomethasone (QVAR) 80 MCG/ACT inhaler Inhale 2 puffs into the lungs 2 (two) times daily.    . carvedilol (COREG) 6.25 MG tablet Take 6.25 mg by mouth 2 (two) times daily with a meal.     . Cholecalciferol (VITAMIN D) 2000 units tablet Take 2,000 Units by mouth daily.    . dapagliflozin propanediol (FARXIGA) 10 MG TABS tablet Take 10 mg by mouth daily.    . DULoxetine (CYMBALTA) 30 MG capsule Take 30 mg by mouth daily.    . furosemide (LASIX) 20 MG tablet Take 20 mg by mouth daily.    [name] HYDROcodone-acetaminophen (NORCO/VICODIN) 5-325 MG tablet Take 1 tablet by mouth every 6 (six) hours as needed for moderate pain.    [name] ipratropium (ATROVENT) 0.03 % nasal spray Place 2 sprays into both nostrils every 12 (twelve) hours.    [name] levocetirizine (XYZAL) 5 MG tablet Take 5 mg by mouth every evening.    [name] levothyroxine (SYNTHROID, LEVOTHROID) 50 MCG tablet Take 50 mcg by mouth daily before breakfast.    . lisinopril (PRINIVIL,ZESTRIL) 10 MG tablet Take 10 mg by mouth daily.    . Magnesium 250 MG TABS Take 1 tablet by mouth daily.    . mirabegron ER (MYRBETRIQ) 50 MG TB24 tablet Take 50 mg by mouth daily.    . Multiple Vitamin (MULTIVITAMIN) tablet Take 1 tablet by mouth daily.    . niacin (NIASPAN) 500 MG CR tablet Take 500 mg by mouth at bedtime.    . nitroGLYCERIN (NITROSTAT) 0.4 MG SL tablet Place 0.4 mg under the  tongue every 5 (five) minutes as needed for chest pain.    . Omega-3 Fatty Acids (FISH OIL) 1200 MG CAPS Take 1 capsule by mouth daily.    . pantoprazole (PROTONIX) 40 MG tablet Take 40 mg by mouth daily.    . pramipexole (MIRAPEX) 0.5 MG tablet Take 0.5 mg by mouth 3 (three) times daily.    . rivaroxaban (XARELTO) 10 MG TABS tablet Take 10 mg by mouth daily.    . sitaGLIPtin-metformin (JANUMET) 50-500 MG tablet Take 1 tablet by mouth 2 (two) times daily with a meal.    . solifenacin (VESICARE) 5 MG tablet Take 5 mg by mouth daily.    . vitamin B-12 (CYANOCOBALAMIN) 1000 MCG tablet Take 1,000 mcg by mouth daily.     No current facility-administered medications for this visit.     REVIEW OF SYSTEMS:  [X]  denotes positive finding, [ ]  denotes negative finding Cardiac   Comments:  Chest pain or chest pressure:    Shortness of breath upon exertion:    Short of breath when lying flat:    Irregular heart rhythm:        Vascular    Pain in calf, thigh, or hip brought on by ambulation:    Pain in feet at night that wakes you up from your sleep:     Blood clot in your veins:    Leg swelling:           PHYSICAL EXAM: Vitals:   08/28/17 0927  BP: 138/81  Pulse: (!) 58  Resp: 16  Temp: (!) 97.5 F (36.4 C)  TempSrc: Oral  SpO2: 94%  Weight: 237 lb (107.5 kg)  Height: 5\' 11"  (1.803 m)    GENERAL: The patient is a well-nourished male, in no acute distress. The vital signs are documented above. CARDIOVASCULAR: He does have some erythema over the ablation site from mid calf to groin.  Minimal tenderness associated with this PULMONARY: There is good air exchange  MUSCULOSKELETAL: There are no major deformities or cyanosis. NEUROLOGIC: No focal weakness or paresthesias are detected. SKIN: There are no ulcers or rashes noted. PSYCHIATRIC: The patient has a normal affect.  DATA:  Duplex shows closure of his great saphenous vein from the mid calf to 1.5 cm below the saphenofemoral junction and no evidence of DVT  MEDICAL ISSUES: Excellent Detrick Dani result from ablation of his left great saphenous vein.  Will wear his compression garment on an as-needed basis.  Will see [place] again as needed    Rosetta Posner, MD [facility] Tel [phone] Pager [phone]

## 2017-09-16 DIAGNOSIS — G2581 Restless legs syndrome: Secondary | ICD-10-CM | POA: Diagnosis not present

## 2017-09-16 DIAGNOSIS — N182 Chronic kidney disease, stage 2 (mild): Secondary | ICD-10-CM | POA: Diagnosis not present

## 2017-09-16 DIAGNOSIS — I1310 Hypertensive heart and chronic kidney disease without heart failure, with stage 1 through stage 4 chronic kidney disease, or unspecified chronic kidney disease: Secondary | ICD-10-CM | POA: Diagnosis not present

## 2017-09-16 DIAGNOSIS — N08 Glomerular disorders in diseases classified elsewhere: Secondary | ICD-10-CM | POA: Diagnosis not present

## 2017-09-16 DIAGNOSIS — E1122 Type 2 diabetes mellitus with diabetic chronic kidney disease: Secondary | ICD-10-CM | POA: Diagnosis not present

## 2017-09-30 DIAGNOSIS — R918 Other nonspecific abnormal finding of lung field: Secondary | ICD-10-CM | POA: Diagnosis not present

## 2017-09-30 DIAGNOSIS — I700 Atherosclerosis of aorta: Secondary | ICD-10-CM | POA: Diagnosis not present

## 2017-10-09 DIAGNOSIS — R5383 Other fatigue: Secondary | ICD-10-CM | POA: Diagnosis not present

## 2017-10-09 DIAGNOSIS — G4733 Obstructive sleep apnea (adult) (pediatric): Secondary | ICD-10-CM | POA: Diagnosis not present

## 2017-10-09 DIAGNOSIS — J301 Allergic rhinitis due to pollen: Secondary | ICD-10-CM | POA: Diagnosis not present

## 2017-10-09 DIAGNOSIS — J4550 Severe persistent asthma, uncomplicated: Secondary | ICD-10-CM | POA: Diagnosis not present

## 2017-10-14 DIAGNOSIS — Z79899 Other long term (current) drug therapy: Secondary | ICD-10-CM | POA: Diagnosis not present

## 2017-10-14 DIAGNOSIS — J4531 Mild persistent asthma with (acute) exacerbation: Secondary | ICD-10-CM | POA: Diagnosis not present

## 2017-10-14 DIAGNOSIS — G2581 Restless legs syndrome: Secondary | ICD-10-CM | POA: Diagnosis not present

## 2017-10-15 DIAGNOSIS — G4733 Obstructive sleep apnea (adult) (pediatric): Secondary | ICD-10-CM | POA: Diagnosis not present

## 2017-10-30 DIAGNOSIS — I1310 Hypertensive heart and chronic kidney disease without heart failure, with stage 1 through stage 4 chronic kidney disease, or unspecified chronic kidney disease: Secondary | ICD-10-CM | POA: Diagnosis not present

## 2017-10-30 DIAGNOSIS — N182 Chronic kidney disease, stage 2 (mild): Secondary | ICD-10-CM | POA: Diagnosis not present

## 2017-10-30 DIAGNOSIS — N08 Glomerular disorders in diseases classified elsewhere: Secondary | ICD-10-CM | POA: Diagnosis not present

## 2017-10-30 DIAGNOSIS — E1122 Type 2 diabetes mellitus with diabetic chronic kidney disease: Secondary | ICD-10-CM | POA: Diagnosis not present

## 2017-11-03 DIAGNOSIS — Z8546 Personal history of malignant neoplasm of prostate: Secondary | ICD-10-CM | POA: Diagnosis not present

## 2017-11-03 DIAGNOSIS — R3915 Urgency of urination: Secondary | ICD-10-CM | POA: Diagnosis not present

## 2017-11-03 DIAGNOSIS — R350 Frequency of micturition: Secondary | ICD-10-CM | POA: Diagnosis not present

## 2017-11-06 DIAGNOSIS — G4733 Obstructive sleep apnea (adult) (pediatric): Secondary | ICD-10-CM | POA: Diagnosis not present

## 2017-11-06 DIAGNOSIS — J301 Allergic rhinitis due to pollen: Secondary | ICD-10-CM | POA: Diagnosis not present

## 2017-11-06 DIAGNOSIS — R5383 Other fatigue: Secondary | ICD-10-CM | POA: Diagnosis not present

## 2017-11-06 DIAGNOSIS — R918 Other nonspecific abnormal finding of lung field: Secondary | ICD-10-CM | POA: Diagnosis not present

## 2017-11-06 DIAGNOSIS — J4550 Severe persistent asthma, uncomplicated: Secondary | ICD-10-CM | POA: Diagnosis not present

## 2017-11-10 DIAGNOSIS — E1140 Type 2 diabetes mellitus with diabetic neuropathy, unspecified: Secondary | ICD-10-CM | POA: Diagnosis not present

## 2017-11-10 DIAGNOSIS — M549 Dorsalgia, unspecified: Secondary | ICD-10-CM | POA: Diagnosis not present

## 2017-11-10 DIAGNOSIS — M79671 Pain in right foot: Secondary | ICD-10-CM | POA: Diagnosis not present

## 2017-11-18 ENCOUNTER — Ambulatory Visit (INDEPENDENT_AMBULATORY_CARE_PROVIDER_SITE_OTHER): Payer: Medicare Other | Admitting: Sports Medicine

## 2017-11-18 ENCOUNTER — Encounter: Payer: Self-pay | Admitting: Sports Medicine

## 2017-11-18 VITALS — BP 121/68 | HR 70 | Resp 16

## 2017-11-18 DIAGNOSIS — M79674 Pain in right toe(s): Secondary | ICD-10-CM

## 2017-11-18 DIAGNOSIS — E785 Hyperlipidemia, unspecified: Secondary | ICD-10-CM | POA: Insufficient documentation

## 2017-11-18 DIAGNOSIS — I10 Essential (primary) hypertension: Secondary | ICD-10-CM | POA: Insufficient documentation

## 2017-11-18 DIAGNOSIS — B351 Tinea unguium: Secondary | ICD-10-CM

## 2017-11-18 DIAGNOSIS — E1142 Type 2 diabetes mellitus with diabetic polyneuropathy: Secondary | ICD-10-CM | POA: Diagnosis not present

## 2017-11-18 DIAGNOSIS — M79675 Pain in left toe(s): Secondary | ICD-10-CM

## 2017-11-18 DIAGNOSIS — I499 Cardiac arrhythmia, unspecified: Secondary | ICD-10-CM | POA: Insufficient documentation

## 2017-11-18 DIAGNOSIS — Q828 Other specified congenital malformations of skin: Secondary | ICD-10-CM

## 2017-11-18 DIAGNOSIS — I739 Peripheral vascular disease, unspecified: Secondary | ICD-10-CM

## 2017-11-18 NOTE — Progress Notes (Signed)
Subjective: Franklin Thornton is a 72 y.o. male patient with history of diabetes who presents to office today complaining of lesion on bottom of right foot that feels like he is stepping on a marble, tried pads with no relief. Patient also admits to long,mildly painful nails especially the right 1st toe nail that gets "gunk" under it. Patient reports some pain to nails while ambulating in shoes; unable to trim. Patient states that the glucose reading this morning was not recorded. Patient denies any new changes in medication or new problems.   Review of Systems  All other systems reviewed and are negative.  Patient Active Problem List   Diagnosis Date Noted  . Arrhythmia 11/18/2017  . Hyperlipidemia 11/18/2017  . Hypertension 11/18/2017  . Fall (on)(from) sidewalk curb, initial encounter 11/03/2015  . Varicose veins of left lower extremity with complications 71/24/5809  . Swelling of limb 08/14/2015  . Pain in both lower extremities 08/14/2015  . Globus pharyngeus 07/17/2015  . Uvular edema 07/17/2015  . Abdominal pain 12/12/2013  . History of carcinoma in situ of prostate 12/12/2013  . Male erectile dysfunction 12/12/2013  . Personal history of prostate cancer 12/12/2013  . OAB (overactive bladder) 11/03/2013  . Malignant neoplasm of prostate ([place]) 11/03/2013  . Nocturia 11/03/2013  . Stress incontinence 11/03/2013  . Urinary incontinence 11/03/2013  . Headache associated with sexual activity 12/14/2012  . Memory loss 12/14/2012  . Myalgia and myositis 12/14/2012  . Obstructive sleep apnea 12/14/2012  . Insomnia 12/14/2012  . 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 Restless legs syndrome 12/14/2012  . Tension type headache 12/14/2012  . Acute renal failure ([place]) 07/11/2012  . AKI (acute kidney injury) ([place]) 07/11/2012  . Altered mental status 07/11/2012  . Dehydration, severe 07/11/2012  . Metabolic acidosis 98/33/8250  .  Noninfective gastroenteritis and colitis 07/11/2012   Current Outpatient Medications on File Prior to Visit  Medication Sig Dispense Refill  . albuterol (PROVENTIL HFA;VENTOLIN HFA) 108 (90 Base) MCG/ACT inhaler Inhale 2 puffs into the lungs every 6 (six) hours as needed for wheezing or shortness of breath.    [name] amiodarone (PACERONE) 200 MG tablet Take 200 mg by mouth daily.    [name] aspirin 81 MG tablet Take 81 mg by mouth daily.    [name] atorvastatin (LIPITOR) 20 MG tablet Take 20 mg by mouth daily.    [name] b complex vitamins capsule Take 1 capsule by mouth daily.    . beclomethasone (QVAR) 80 MCG/ACT inhaler Inhale 2 puffs into the lungs 2 (two) times daily.    . carvedilol (COREG) 6.25 MG tablet Take 6.25 mg by mouth 2 (two) times daily with a meal.    . Cephalexin 500 MG tablet TK 1 T PO Q 8 H  0  . Cholecalciferol (VITAMIN D) 2000 units tablet Take 2,000 Units by mouth daily.    . dapagliflozin propanediol (FARXIGA) 10 MG TABS tablet Take 10 mg by mouth daily.    . DULoxetine (CYMBALTA) 30 MG capsule Take 30 mg by mouth daily.    . furosemide (LASIX) 20 MG tablet Take 20 mg by mouth daily.    [name] HYDROcodone-acetaminophen (NORCO/VICODIN) 5-325 MG tablet Take 1 tablet by mouth every 6 (six) hours as needed for moderate pain.    [name] ipratropium (ATROVENT) 0.03 % nasal spray Place 2 sprays into both nostrils every 12 (twelve) hours.    [name] levocetirizine (XYZAL) 5 MG tablet Take 5 mg by mouth every  evening.    [name] levothyroxine (SYNTHROID, LEVOTHROID) 50 MCG tablet Take 50 mcg by mouth daily before breakfast.    . lisinopril (PRINIVIL,ZESTRIL) 10 MG tablet Take 10 mg by mouth daily.    . Magnesium 250 MG TABS Take 1 tablet by mouth daily.    . mirabegron ER (MYRBETRIQ) 50 MG TB24 tablet Take 50 mg by mouth daily.    . Multiple Vitamin (MULTIVITAMIN) tablet Take 1 tablet by mouth daily.    . niacin (NIASPAN) 500 MG CR tablet Take 500 mg by mouth at bedtime.    . nitroGLYCERIN (NITROSTAT) 0.4 MG SL tablet  Place 0.4 mg under the tongue every 5 (five) minutes as needed for chest pain.    . Omega-3 Fatty Acids (FISH OIL) 1200 MG CAPS Take 1 capsule by mouth daily.    . pantoprazole (PROTONIX) 40 MG tablet Take 40 mg by mouth daily.    . pramipexole (MIRAPEX) 0.5 MG tablet Take 0.5 mg by mouth 3 (three) times daily.    . rivaroxaban (XARELTO) 10 MG TABS tablet Take 10 mg by mouth daily.    . sitaGLIPtin-metformin (JANUMET) 50-500 MG tablet Take 1 tablet by mouth 2 (two) times daily with a meal.    . solifenacin (VESICARE) 5 MG tablet Take 5 mg by mouth daily.    . vitamin B-12 (CYANOCOBALAMIN) 1000 MCG tablet Take 1,000 mcg by mouth daily.     No current facility-administered medications on file prior to visit.    Allergies  Allergen Reactions  . No Known Allergies     No results found for this or any previous visit (from the past 2160 hour(s)).  Objective: General: Patient is awake, alert, and oriented x 3 and in no acute distress.  Integument: Skin is warm, dry and supple bilateral. Nails are tender, long, thickened and dystrophic with subungual debris, consistent with onychomycosis, 1-5 bilateral. No signs of infection. + nucleated keratotic lesion sub met 2 on right foot. Remaining integument unremarkable.  Vasculature:  Dorsalis Pedis pulse 1/4 bilateral. Posterior Tibial pulse  1/4 bilateral. Capillary fill time <5 sec 1-5 bilateral. No hair growth to the level of the digits.Temperature gradient within normal limits. + varicosities present bilateral. Trace edema present bilateral.   Neurology: The patient has intact sensation measured with a 5.07/10g Semmes Weinstein Monofilament at all pedal sites bilateral . Vibratory sensation diminished bilateral with tuning fork. No Babinski sign present bilateral.   Musculoskeletal: Mild fat pad atrophy pedal deformities noted bilateral. Muscular strength 5/5 in all lower extremity muscular groups bilateral without pain on range of motion. No  tenderness with calf compression bilateral.  Assessment and Plan: Problem List Items Addressed This Visit    None    Visit Diagnoses    Porokeratosis    -  Primary   Pain due to onychomycosis of toenails of both feet       Relevant Medications   Cephalexin 500 MG tablet   PVD (peripheral vascular disease) (HCC)       Diabetic polyneuropathy associated with type 2 diabetes mellitus ([place])          -Examined patient. -Discussed and educated patient on diabetic foot care, especially with  regards to the vascular, neurological and musculoskeletal systems.  -Stressed the importance of good glycemic control and the detriment of not  controlling glucose levels in relation to the foot. -Mechanically debrided keratosis x 1 using sterile chisel blade and all nails 1-5 bilateral using sterile nail nipper and filed with dremel  without incident  -Answered all patient questions -Patient to return  in 3 months for at risk foot care -Patient advised to call the office if any problems or questions arise in the meantime.  Landis Martins, DPM

## 2017-11-24 ENCOUNTER — Other Ambulatory Visit: Payer: Self-pay | Admitting: Internal Medicine

## 2017-11-24 DIAGNOSIS — K439 Ventral hernia without obstruction or gangrene: Secondary | ICD-10-CM

## 2017-11-27 ENCOUNTER — Other Ambulatory Visit: Payer: Self-pay | Admitting: Internal Medicine

## 2017-11-27 DIAGNOSIS — K439 Ventral hernia without obstruction or gangrene: Secondary | ICD-10-CM

## 2017-12-01 DIAGNOSIS — G4733 Obstructive sleep apnea (adult) (pediatric): Secondary | ICD-10-CM | POA: Diagnosis not present

## 2017-12-08 DIAGNOSIS — N182 Chronic kidney disease, stage 2 (mild): Secondary | ICD-10-CM | POA: Diagnosis not present

## 2017-12-08 DIAGNOSIS — E1140 Type 2 diabetes mellitus with diabetic neuropathy, unspecified: Secondary | ICD-10-CM | POA: Diagnosis not present

## 2017-12-08 DIAGNOSIS — I1310 Hypertensive heart and chronic kidney disease without heart failure, with stage 1 through stage 4 chronic kidney disease, or unspecified chronic kidney disease: Secondary | ICD-10-CM | POA: Diagnosis not present

## 2017-12-08 DIAGNOSIS — I2510 Atherosclerotic heart disease of native coronary artery without angina pectoris: Secondary | ICD-10-CM | POA: Diagnosis not present

## 2017-12-08 DIAGNOSIS — E039 Hypothyroidism, unspecified: Secondary | ICD-10-CM | POA: Diagnosis not present

## 2017-12-11 ENCOUNTER — Ambulatory Visit
Admission: RE | Admit: 2017-12-11 | Discharge: 2017-12-11 | Disposition: A | Discharge status: Home / Self Care | Payer: Medicare Other | Source: Ambulatory Visit | Attending: Internal Medicine | Admitting: Internal Medicine

## 2017-12-11 DIAGNOSIS — J301 Allergic rhinitis due to pollen: Secondary | ICD-10-CM | POA: Diagnosis not present

## 2017-12-11 DIAGNOSIS — K429 Umbilical hernia without obstruction or gangrene: Secondary | ICD-10-CM | POA: Diagnosis not present

## 2017-12-11 DIAGNOSIS — J4550 Severe persistent asthma, uncomplicated: Secondary | ICD-10-CM | POA: Diagnosis not present

## 2017-12-11 DIAGNOSIS — K439 Ventral hernia without obstruction or gangrene: Secondary | ICD-10-CM

## 2017-12-11 DIAGNOSIS — R5383 Other fatigue: Secondary | ICD-10-CM | POA: Diagnosis not present

## 2017-12-11 DIAGNOSIS — R918 Other nonspecific abnormal finding of lung field: Secondary | ICD-10-CM | POA: Diagnosis not present

## 2017-12-11 DIAGNOSIS — G4733 Obstructive sleep apnea (adult) (pediatric): Secondary | ICD-10-CM | POA: Diagnosis not present

## 2017-12-11 MED ORDER — IOPAMIDOL (ISOVUE-370) INJECTION 76%
125.0000 mL | Freq: Once | INTRAVENOUS | Status: AC | PRN
  Administered 2017-12-11: 125 mL via INTRAVENOUS

## 2017-12-27 ENCOUNTER — Other Ambulatory Visit: Payer: Self-pay | Admitting: Internal Medicine

## 2017-12-27 DIAGNOSIS — E049 Nontoxic goiter, unspecified: Secondary | ICD-10-CM

## 2018-01-09 ENCOUNTER — Ambulatory Visit
Admission: RE | Admit: 2018-01-09 | Discharge: 2018-01-09 | Disposition: A | Discharge status: Home / Self Care | Payer: Medicare Other | Source: Ambulatory Visit | Attending: Internal Medicine | Admitting: Internal Medicine

## 2018-01-09 DIAGNOSIS — E049 Nontoxic goiter, unspecified: Secondary | ICD-10-CM

## 2018-01-09 DIAGNOSIS — M6208 Separation of muscle (nontraumatic), other site: Secondary | ICD-10-CM | POA: Diagnosis not present

## 2018-01-09 DIAGNOSIS — E041 Nontoxic single thyroid nodule: Secondary | ICD-10-CM | POA: Diagnosis not present

## 2018-02-16 DIAGNOSIS — M6208 Separation of muscle (nontraumatic), other site: Secondary | ICD-10-CM | POA: Diagnosis not present

## 2018-02-16 DIAGNOSIS — M6281 Muscle weakness (generalized): Secondary | ICD-10-CM | POA: Diagnosis not present

## 2018-02-24 ENCOUNTER — Ambulatory Visit (INDEPENDENT_AMBULATORY_CARE_PROVIDER_SITE_OTHER): Payer: Medicare Other | Admitting: Sports Medicine

## 2018-02-24 ENCOUNTER — Encounter: Payer: Self-pay | Admitting: Sports Medicine

## 2018-02-24 DIAGNOSIS — M79674 Pain in right toe(s): Secondary | ICD-10-CM | POA: Diagnosis not present

## 2018-02-24 DIAGNOSIS — I739 Peripheral vascular disease, unspecified: Secondary | ICD-10-CM

## 2018-02-24 DIAGNOSIS — M79675 Pain in left toe(s): Secondary | ICD-10-CM | POA: Diagnosis not present

## 2018-02-24 DIAGNOSIS — Q828 Other specified congenital malformations of skin: Secondary | ICD-10-CM

## 2018-02-24 DIAGNOSIS — E1142 Type 2 diabetes mellitus with diabetic polyneuropathy: Secondary | ICD-10-CM

## 2018-02-24 DIAGNOSIS — B351 Tinea unguium: Secondary | ICD-10-CM | POA: Diagnosis not present

## 2018-02-24 NOTE — Progress Notes (Signed)
Subjective: Franklin Thornton is a 72 y.o. male patient with history of diabetes who presents to office today complaining of  long nails unable to trim and continued pain at the balls that feels like something is bunched underneath the toes. Patient denies any new changes in medication or new problems. FBS not recorded this AM and does not know A1c. Reports that he has to get better about eating properly and taking care of himself as it relates to his diabetes. No other issues noted.   Patient Active Problem List   Diagnosis Date Noted  . Arrhythmia 11/18/2017  . Hyperlipidemia 11/18/2017  . Hypertension 11/18/2017  . Fall (on)(from) sidewalk curb, initial encounter 11/03/2015  . Varicose veins of left lower extremity with complications 57/84/6962  . Swelling of limb 08/14/2015  . Pain in both lower extremities 08/14/2015  . Globus pharyngeus 07/17/2015  . Uvular edema 07/17/2015  . Abdominal pain 12/12/2013  . History of carcinoma in situ of prostate 12/12/2013  . Male erectile dysfunction 12/12/2013  . Personal history of prostate cancer 12/12/2013  . OAB (overactive bladder) 11/03/2013  . Malignant neoplasm of prostate ([place]) 11/03/2013  . Nocturia 11/03/2013  . Stress incontinence 11/03/2013  . Urinary incontinence 11/03/2013  . Headache associated with sexual activity 12/14/2012  . Memory loss 12/14/2012  . Myalgia and myositis 12/14/2012  . Obstructive sleep apnea 12/14/2012  . Insomnia 12/14/2012  . Partial epilepsy with impairment of consciousness, with intractable epilepsy ([place]) 12/14/2012  . Restless legs syndrome 12/14/2012  . Tension type headache 12/14/2012  . Acute renal failure ([place]) 07/11/2012  . AKI (acute kidney injury) ([place]) 07/11/2012  . Altered mental status 07/11/2012  . Dehydration, severe 07/11/2012  . Metabolic acidosis 95/28/4132  . Noninfective gastroenteritis and colitis 07/11/2012   Current Outpatient Medications on File Prior to Visit  Medication Sig  Dispense Refill  . albuterol (PROVENTIL HFA;VENTOLIN HFA) 108 (90 Base) MCG/ACT inhaler Inhale 2 puffs into the lungs every 6 (six) hours as needed for wheezing or shortness of breath.    [name] amiodarone (PACERONE) 200 MG tablet Take 200 mg by mouth daily.    [name] aspirin 81 MG tablet Take 81 mg by mouth daily.    [name] atorvastatin (LIPITOR) 20 MG tablet Take 20 mg by mouth daily.    [name] b complex vitamins capsule Take 1 capsule by mouth daily.    . beclomethasone (QVAR) 80 MCG/ACT inhaler Inhale 2 puffs into the lungs 2 (two) times daily.    . carvedilol (COREG) 6.25 MG tablet Take 6.25 mg by mouth 2 (two) times daily with a meal.    . Cephalexin 500 MG tablet TK 1 T PO Q 8 H  0  . Cholecalciferol (VITAMIN D) 2000 units tablet Take 2,000 Units by mouth daily.    . dapagliflozin propanediol (FARXIGA) 10 MG TABS tablet Take 10 mg by mouth daily.    . DULoxetine (CYMBALTA) 30 MG capsule Take 30 mg by mouth daily.    . furosemide (LASIX) 20 MG tablet Take 20 mg by mouth daily.    [name] HYDROcodone-acetaminophen (NORCO/VICODIN) 5-325 MG tablet Take 1 tablet by mouth every 6 (six) hours as needed for moderate pain.    [name] ipratropium (ATROVENT) 0.03 % nasal spray Place 2 sprays into both nostrils every 12 (twelve) hours.    [name] levocetirizine (XYZAL) 5 MG tablet Take 5 mg by mouth every evening.    [name] levothyroxine (SYNTHROID, LEVOTHROID) 50 MCG tablet Take 50 mcg by mouth daily before  breakfast.    . lisinopril (PRINIVIL,ZESTRIL) 10 MG tablet Take 10 mg by mouth daily.    . Magnesium 250 MG TABS Take 1 tablet by mouth daily.    . mirabegron ER (MYRBETRIQ) 50 MG TB24 tablet Take 50 mg by mouth daily.    . Multiple Vitamin (MULTIVITAMIN) tablet Take 1 tablet by mouth daily.    . niacin (NIASPAN) 500 MG CR tablet Take 500 mg by mouth at bedtime.    . nitroGLYCERIN (NITROSTAT) 0.4 MG SL tablet Place 0.4 mg under the tongue every 5 (five) minutes as needed for chest pain.    . Omega-3 Fatty Acids (FISH OIL) 1200 MG CAPS  Take 1 capsule by mouth daily.    . pantoprazole (PROTONIX) 40 MG tablet Take 40 mg by mouth daily.    . pramipexole (MIRAPEX) 0.5 MG tablet Take 0.5 mg by mouth 3 (three) times daily.    . rivaroxaban (XARELTO) 10 MG TABS tablet Take 10 mg by mouth daily.    . sitaGLIPtin-metformin (JANUMET) 50-500 MG tablet Take 1 tablet by mouth 2 (two) times daily with a meal.    . solifenacin (VESICARE) 5 MG tablet Take 5 mg by mouth daily.    . vitamin B-12 (CYANOCOBALAMIN) 1000 MCG tablet Take 1,000 mcg by mouth daily.     No current facility-administered medications on file prior to visit.    Allergies  Allergen Reactions  . No Known Allergies     No results found for this or any previous visit (from the past 2160 hour(s)).  Objective: General: Patient is awake, alert, and oriented x 3 and in no acute distress.  Integument: Skin is warm, dry and supple bilateral. Nails are tender, long, thickened and dystrophic with subungual debris, consistent with onychomycosis, 1-5 bilateral. No signs of infection. + mildly nucleated keratotic lesion sub met 2 on right foot. Remaining integument unremarkable.  Vasculature:  Dorsalis Pedis pulse 1/4 bilateral. Posterior Tibial pulse  1/4 bilateral. Capillary fill time <5 sec 1-5 bilateral. No hair growth to the level of the digits.Temperature gradient within normal limits. + varicosities present bilateral. Trace edema present bilateral.   Neurology: The patient has intact sensation measured with a 5.07/10g Semmes Weinstein Monofilament at all pedal sites bilateral . Vibratory sensation diminished bilateral with tuning fork. No Babinski sign present bilateral.   Musculoskeletal: Mild fat pad atrophy pedal deformities noted bilateral. Muscular strength 5/5 in all lower extremity muscular groups bilateral without pain on range of motion. No tenderness with calf compression bilateral.  Assessment and Plan: Problem List Items Addressed This Visit    None    Visit  Diagnoses    Porokeratosis    -  Primary   Pain due to onychomycosis of toenails of both feet       PVD (peripheral vascular disease) ([place])       Diabetic polyneuropathy associated with type 2 diabetes mellitus ([place])          -Examined patient. -Discussed and educated patient on diabetic foot care, especially with  regards to the vascular, neurological and musculoskeletal systems.  -Mechanically debrided keratosis x 1 at no charge using sterile chisel blade and all nails 1-5 bilateral using sterile nail nipper and filed with dremel without incident  -Answered all patient questions -Patient to return  in 3 months for at risk foot care -Patient advised to call the office if any problems or questions arise in the meantime.  Landis Martins, DPM

## 2018-02-26 DIAGNOSIS — M6281 Muscle weakness (generalized): Secondary | ICD-10-CM | POA: Diagnosis not present

## 2018-02-26 DIAGNOSIS — R6889 Other general symptoms and signs: Secondary | ICD-10-CM | POA: Diagnosis not present

## 2018-02-26 DIAGNOSIS — K469 Unspecified abdominal hernia without obstruction or gangrene: Secondary | ICD-10-CM | POA: Diagnosis not present

## 2018-03-03 ENCOUNTER — Other Ambulatory Visit: Payer: Self-pay | Admitting: Internal Medicine

## 2018-03-05 DIAGNOSIS — J301 Allergic rhinitis due to pollen: Secondary | ICD-10-CM | POA: Diagnosis not present

## 2018-03-05 DIAGNOSIS — J4550 Severe persistent asthma, uncomplicated: Secondary | ICD-10-CM | POA: Diagnosis not present

## 2018-03-05 DIAGNOSIS — R918 Other nonspecific abnormal finding of lung field: Secondary | ICD-10-CM | POA: Diagnosis not present

## 2018-03-05 DIAGNOSIS — J45909 Unspecified asthma, uncomplicated: Secondary | ICD-10-CM | POA: Diagnosis not present

## 2018-03-05 DIAGNOSIS — G4733 Obstructive sleep apnea (adult) (pediatric): Secondary | ICD-10-CM | POA: Diagnosis not present

## 2018-03-05 DIAGNOSIS — Z87891 Personal history of nicotine dependence: Secondary | ICD-10-CM | POA: Diagnosis not present

## 2018-03-05 DIAGNOSIS — R5383 Other fatigue: Secondary | ICD-10-CM | POA: Diagnosis not present

## 2018-03-06 DIAGNOSIS — R6889 Other general symptoms and signs: Secondary | ICD-10-CM | POA: Diagnosis not present

## 2018-03-06 DIAGNOSIS — K469 Unspecified abdominal hernia without obstruction or gangrene: Secondary | ICD-10-CM | POA: Diagnosis not present

## 2018-03-06 DIAGNOSIS — M6281 Muscle weakness (generalized): Secondary | ICD-10-CM | POA: Diagnosis not present

## 2018-03-10 ENCOUNTER — Other Ambulatory Visit: Payer: Self-pay

## 2018-03-11 ENCOUNTER — Ambulatory Visit: Payer: Medicare Other | Admitting: Internal Medicine

## 2018-03-16 ENCOUNTER — Other Ambulatory Visit: Payer: Self-pay | Admitting: Internal Medicine

## 2018-03-17 ENCOUNTER — Encounter (INDEPENDENT_AMBULATORY_CARE_PROVIDER_SITE_OTHER): Payer: Self-pay

## 2018-03-17 ENCOUNTER — Encounter: Payer: Self-pay | Admitting: Internal Medicine

## 2018-03-17 ENCOUNTER — Ambulatory Visit (INDEPENDENT_AMBULATORY_CARE_PROVIDER_SITE_OTHER): Payer: Medicare Other | Admitting: Internal Medicine

## 2018-03-17 VITALS — BP 110/72 | HR 65 | Temp 97.6°F | Ht 71.0 in | Wt 240.8 lb

## 2018-03-17 DIAGNOSIS — Z6833 Body mass index (BMI) 33.0-33.9, adult: Secondary | ICD-10-CM | POA: Diagnosis not present

## 2018-03-17 DIAGNOSIS — J0100 Acute maxillary sinusitis, unspecified: Secondary | ICD-10-CM | POA: Diagnosis not present

## 2018-03-17 DIAGNOSIS — R490 Dysphonia: Secondary | ICD-10-CM | POA: Diagnosis not present

## 2018-03-17 DIAGNOSIS — H6191 Disorder of right external ear, unspecified: Secondary | ICD-10-CM

## 2018-03-17 DIAGNOSIS — I129 Hypertensive chronic kidney disease with stage 1 through stage 4 chronic kidney disease, or unspecified chronic kidney disease: Secondary | ICD-10-CM

## 2018-03-17 DIAGNOSIS — L989 Disorder of the skin and subcutaneous tissue, unspecified: Secondary | ICD-10-CM | POA: Diagnosis not present

## 2018-03-17 DIAGNOSIS — E1122 Type 2 diabetes mellitus with diabetic chronic kidney disease: Secondary | ICD-10-CM

## 2018-03-17 DIAGNOSIS — N182 Chronic kidney disease, stage 2 (mild): Secondary | ICD-10-CM

## 2018-03-17 DIAGNOSIS — E6609 Other obesity due to excess calories: Secondary | ICD-10-CM | POA: Diagnosis not present

## 2018-03-17 DIAGNOSIS — L03115 Cellulitis of right lower limb: Secondary | ICD-10-CM

## 2018-03-17 DIAGNOSIS — G4733 Obstructive sleep apnea (adult) (pediatric): Secondary | ICD-10-CM | POA: Diagnosis not present

## 2018-03-17 DIAGNOSIS — Z7982 Long term (current) use of aspirin: Secondary | ICD-10-CM

## 2018-03-17 MED ORDER — CEFTRIAXONE SODIUM 500 MG IJ SOLR
500.0000 mg | Freq: Once | INTRAMUSCULAR | Status: AC
  Administered 2018-03-17: 500 mg via INTRAMUSCULAR

## 2018-03-17 MED ORDER — AMOXICILLIN-POT CLAVULANATE 875-125 MG PO TABS: 1.0000 | ORAL_TABLET | Freq: Two times a day (BID) | ORAL | 0 refills | Status: AC

## 2018-03-17 NOTE — Progress Notes (Signed)
Subjective:     Patient ID: Franklin Thornton , male    DOB: July 04, 1945 , 72 y.o.   MRN: [id number]   Chief Complaint  Patient presents with  . Diabetes  . Hypertension    HPI  Diabetes  He presents for his follow-up diabetic visit. He has type 2 diabetes mellitus. His disease course has been improving. There are no hypoglycemic associated symptoms. There are no hypoglycemic complications. Diabetic complications include nephropathy. Risk factors for coronary artery disease include diabetes mellitus, dyslipidemia, hypertension, male sex, obesity and sedentary lifestyle. He is compliant with treatment most of the time.  Hypertension  This is a chronic problem. The current episode started more than 1 year ago. The problem has been gradually improving since onset. The problem is controlled.  He reports compliance with meds.    Past Medical History:  Diagnosis Date  . Atrial fibrillation ([place])   . Diabetes mellitus without complication ([place])   . Peripheral vascular disease ([place])   . Prostate cancer [facility])      Family History  Problem Relation Age of Onset  . Lung cancer Mother   . Diabetes Mother   . Esophageal cancer Father      Current Outpatient Medications:  .  albuterol (PROVENTIL HFA;VENTOLIN HFA) 108 (90 Base) MCG/ACT inhaler, Inhale 2 puffs into the lungs every 6 (six) hours as needed for wheezing or shortness of breath., Disp: , Rfl:  .  amiodarone (PACERONE) 200 MG tablet, Take 200 mg by mouth daily., Disp: , Rfl:  .  aspirin 81 MG tablet, Take 81 mg by mouth daily., Disp: , Rfl:  .  atorvastatin (LIPITOR) 20 MG tablet, TAKE 1 TABLET DAILY, Disp: 90 tablet, Rfl: 1 .  b complex vitamins capsule, Take 1 capsule by mouth daily., Disp: , Rfl:  .  beclomethasone (QVAR) 80 MCG/ACT inhaler, Inhale 2 puffs into the lungs 2 (two) times daily., Disp: , Rfl:  .  carvedilol (COREG) 6.25 MG tablet, Take 6.25 mg by mouth 2 (two) times daily with a meal., Disp: , Rfl:  .  Cholecalciferol  (VITAMIN D) 2000 units tablet, Take 2,000 Units by mouth daily., Disp: , Rfl:  .  dapagliflozin propanediol (FARXIGA) 10 MG TABS tablet, Take 10 mg by mouth daily., Disp: , Rfl:  .  DULoxetine (CYMBALTA) 30 MG capsule, Take 30 mg by mouth daily., Disp: , Rfl:  .  furosemide (LASIX) 20 MG tablet, Take 20 mg by mouth daily., Disp: , Rfl:  .  HYDROcodone-acetaminophen (NORCO/VICODIN) 5-325 MG tablet, Take 1 tablet by mouth every 6 (six) hours as needed for moderate pain., Disp: , Rfl:  .  ipratropium (ATROVENT) 0.03 % nasal spray, Place 2 sprays into both nostrils every 12 (twelve) hours., Disp: , Rfl:  .  levocetirizine (XYZAL) 5 MG tablet, Take 5 mg by mouth every evening., Disp: , Rfl:  .  levothyroxine (SYNTHROID, LEVOTHROID) 50 MCG tablet, Take 50 mcg by mouth daily before breakfast., Disp: , Rfl:  .  lisinopril (PRINIVIL,ZESTRIL) 20 MG tablet, , Disp: , Rfl:  .  Magnesium 250 MG TABS, Take 1 tablet by mouth daily., Disp: , Rfl:  .  mirabegron ER (MYRBETRIQ) 50 MG TB24 tablet, Take 50 mg by mouth daily., Disp: , Rfl:  .  Multiple Vitamin (MULTIVITAMIN) tablet, Take 1 tablet by mouth daily., Disp: , Rfl:  .  nitroGLYCERIN (NITROSTAT) 0.4 MG SL tablet, Place 0.4 mg under the tongue every 5 (five) minutes as needed for chest pain., Disp: ,  Rfl:  .  pantoprazole (PROTONIX) 40 MG tablet, TAKE 1 TABLET DAILY, Disp: 90 tablet, Rfl: 1 .  pramipexole (MIRAPEX) 0.5 MG tablet, Take 0.5 mg by mouth 3 (three) times daily., Disp: , Rfl:  .  rivaroxaban (XARELTO) 10 MG TABS tablet, Take 10 mg by mouth daily., Disp: , Rfl:  .  vitamin B-12 (CYANOCOBALAMIN) 1000 MCG tablet, Take 1,000 mcg by mouth daily., Disp: , Rfl:  .  amoxicillin-clavulanate (AUGMENTIN) 875-125 MG tablet, Take 1 tablet by mouth every 12 (twelve) hours for 10 days., Disp: 20 tablet, Rfl: 0  Current Facility-Administered Medications:  .  cefTRIAXone (ROCEPHIN) injection 500 mg, 500 mg, Intramuscular, Once, [name], MD   Allergies    Allergen Reactions  . No Known Allergies      Review of Systems  Constitutional: Negative.   HENT: Positive for sinus pressure and sinus pain.   Respiratory: Negative.   Cardiovascular: Negative.   Gastrointestinal: Negative.   Neurological: Negative.   Psychiatric/Behavioral: Negative.      Today's Vitals   03/17/18 1414  BP: 110/72  Pulse: 65  Temp: 97.6 F (36.4 C)  TempSrc: Oral  Weight: 240 lb 12.8 oz (109.2 kg)  Height: _0  (1.803 m)  PainSc: 0-No pain   Body mass index is 33.58 kg/m.   Objective:  Physical Exam  Constitutional: He is oriented to person, place, and time. He appears well-developed and well-nourished.  Looks ill  HENT:  Head: Normocephalic and atraumatic.  Eyes: EOM are normal.  Cardiovascular: Normal rate, regular rhythm and normal heart sounds.  Pulmonary/Chest: Effort normal and breath sounds normal.  Neurological: He is alert and oriented to person, place, and time.  Skin: There is erythema.     Raised, scaly lesion on top of right ear. Sl. Erythematous. Also with hyperpigmented lesion on left cheek  Psychiatric: He has a normal mood and affect.  Nursing note and vitals reviewed.       Assessment And Plan:     1. Diabetes mellitus with stage 2 chronic kidney disease ([place])  I will check labs as listed below. Importance of regular exercise was discussed with the patient.  He is encouraged to walk 30 minutes four to five days weekly.   - CMP14+EGFR - Hemoglobin A1c  2. Chronic renal disease, stage II  Chronic. I will check a GFR, CR today.   3. Hypertensive nephropathy  Well controlled. He will continue with current meds. He is encouraged to avoid adding salt to his foods.   4. Cellulitis of right lower extremity  He was given rx augmentin and encouraged to complete full abx course.   - cefTRIAXone (ROCEPHIN) injection 500 mg - CBC with Diff  5. Acute non-recurrent maxillary sinusitis  He was given rx augmentin and  encouraged to take full abx course. He is also encouraged to avoid dairy products for the next two weeks.   6. Skin lesion of right ear  I will refer him to Derm for further evaluation.   7. Hoarseness of voice  I will refer him to ENT for further evaluation as requested.   8. Class 1 obesity due to excess calories with serious comorbidity and body mass index (BMI) of 33.0 to 33.9 in adult  He is encouraged to strive for BMI less than 30 to decrease cardiac risk. Importance of regular exercise was again stressed to the patient.   [name], MD

## 2018-03-17 NOTE — Patient Instructions (Signed)

## 2018-03-18 LAB — CBC WITH DIFFERENTIAL/PLATELET
BASOS: 1 %
Basophils Absolute: 0.1 10*3/uL (ref 0.0–0.2)
EOS (ABSOLUTE): 0.5 10*3/uL — ABNORMAL HIGH (ref 0.0–0.4)
EOS: 7 %
HEMATOCRIT: 41.9 % (ref 37.5–51.0)
Hemoglobin: 14.7 g/dL (ref 13.0–17.7)
IMMATURE GRANS (ABS): 0 10*3/uL (ref 0.0–0.1)
Immature Granulocytes: 0 %
Lymphocytes Absolute: 1.3 10*3/uL (ref 0.7–3.1)
Lymphs: 18 %
MCH: 30.8 pg (ref 26.6–33.0)
MCHC: 35.1 g/dL (ref 31.5–35.7)
MCV: 88 fL (ref 79–97)
MONOS ABS: 0.8 10*3/uL (ref 0.1–0.9)
Monocytes: 11 %
NEUTROS ABS: 4.5 10*3/uL (ref 1.4–7.0)
NEUTROS PCT: 63 %
Platelets: 247 10*3/uL (ref 150–450)
RBC: 4.78 x10E6/uL (ref 4.14–5.80)
RDW: 12.7 % (ref 12.3–15.4)
WBC: 7.1 10*3/uL (ref 3.4–10.8)

## 2018-03-18 LAB — CMP14+EGFR
A/G RATIO: 1.5 (ref 1.2–2.2)
ALT: 16 IU/L (ref 0–44)
AST: 20 IU/L (ref 0–40)
Albumin: 4 g/dL (ref 3.5–4.8)
Alkaline Phosphatase: 63 IU/L (ref 39–117)
BUN / CREAT RATIO: 19 (ref 10–24)
BUN: 22 mg/dL (ref 8–27)
Bilirubin Total: 0.6 mg/dL (ref 0.0–1.2)
CALCIUM: 9.1 mg/dL (ref 8.6–10.2)
CO2: 24 mmol/L (ref 20–29)
Chloride: 102 mmol/L (ref 96–106)
Creatinine, Ser: 1.16 mg/dL (ref 0.76–1.27)
GFR, EST AFRICAN AMERICAN: 72 mL/min/{1.73_m2} (ref >59)
GFR, EST NON AFRICAN AMERICAN: 62 mL/min/{1.73_m2} (ref >59)
GLOBULIN, TOTAL: 2.6 g/dL (ref 1.5–4.5)
Glucose: 104 mg/dL — ABNORMAL HIGH (ref 65–99)
Potassium: 5 mmol/L (ref 3.5–5.2)
SODIUM: 146 mmol/L — AB (ref 134–144)
TOTAL PROTEIN: 6.6 g/dL (ref 6.0–8.5)

## 2018-03-18 LAB — HEMOGLOBIN A1C
Est. average glucose Bld gHb Est-mCnc: 126 mg/dL
Hgb A1c MFr Bld: 6 % — ABNORMAL HIGH (ref 4.8–5.6)

## 2018-03-18 NOTE — Progress Notes (Signed)
Your blood count is normal. Your sodium level is slightly elevated, please increase your water intake. Your liver and kidney function is normal. Your hba1c is 6.0, this is great.

## 2018-03-22 ENCOUNTER — Encounter: Payer: Self-pay | Admitting: Internal Medicine

## 2018-03-24 DIAGNOSIS — G4733 Obstructive sleep apnea (adult) (pediatric): Secondary | ICD-10-CM | POA: Diagnosis not present

## 2018-03-24 DIAGNOSIS — R5383 Other fatigue: Secondary | ICD-10-CM | POA: Diagnosis not present

## 2018-03-24 DIAGNOSIS — R918 Other nonspecific abnormal finding of lung field: Secondary | ICD-10-CM | POA: Diagnosis not present

## 2018-03-24 DIAGNOSIS — J4550 Severe persistent asthma, uncomplicated: Secondary | ICD-10-CM | POA: Diagnosis not present

## 2018-03-24 DIAGNOSIS — J301 Allergic rhinitis due to pollen: Secondary | ICD-10-CM | POA: Diagnosis not present

## 2018-04-09 DIAGNOSIS — I482 Chronic atrial fibrillation, unspecified: Secondary | ICD-10-CM | POA: Diagnosis not present

## 2018-04-09 DIAGNOSIS — Z961 Presence of intraocular lens: Secondary | ICD-10-CM | POA: Diagnosis not present

## 2018-04-09 DIAGNOSIS — H43812 Vitreous degeneration, left eye: Secondary | ICD-10-CM | POA: Diagnosis not present

## 2018-04-09 DIAGNOSIS — E782 Mixed hyperlipidemia: Secondary | ICD-10-CM | POA: Diagnosis not present

## 2018-04-09 DIAGNOSIS — I2510 Atherosclerotic heart disease of native coronary artery without angina pectoris: Secondary | ICD-10-CM | POA: Diagnosis not present

## 2018-04-09 DIAGNOSIS — I10 Essential (primary) hypertension: Secondary | ICD-10-CM | POA: Diagnosis not present

## 2018-04-09 DIAGNOSIS — H40013 Open angle with borderline findings, low risk, bilateral: Secondary | ICD-10-CM | POA: Diagnosis not present

## 2018-04-09 DIAGNOSIS — E119 Type 2 diabetes mellitus without complications: Secondary | ICD-10-CM | POA: Diagnosis not present

## 2018-04-09 DIAGNOSIS — E1165 Type 2 diabetes mellitus with hyperglycemia: Secondary | ICD-10-CM | POA: Diagnosis not present

## 2018-04-09 DIAGNOSIS — H26491 Other secondary cataract, right eye: Secondary | ICD-10-CM | POA: Diagnosis not present

## 2018-04-09 LAB — HM DIABETES EYE EXAM

## 2018-04-14 ENCOUNTER — Other Ambulatory Visit: Payer: Self-pay | Admitting: Internal Medicine

## 2018-04-22 HISTORY — PX: EXTERNAL EAR SURGERY: SHX627

## 2018-05-13 DIAGNOSIS — K219 Gastro-esophageal reflux disease without esophagitis: Secondary | ICD-10-CM | POA: Diagnosis not present

## 2018-05-13 DIAGNOSIS — R490 Dysphonia: Secondary | ICD-10-CM | POA: Diagnosis not present

## 2018-05-14 ENCOUNTER — Other Ambulatory Visit: Payer: Self-pay | Admitting: Internal Medicine

## 2018-05-14 DIAGNOSIS — J301 Allergic rhinitis due to pollen: Secondary | ICD-10-CM | POA: Diagnosis not present

## 2018-05-14 DIAGNOSIS — J4550 Severe persistent asthma, uncomplicated: Secondary | ICD-10-CM | POA: Diagnosis not present

## 2018-05-14 DIAGNOSIS — G4733 Obstructive sleep apnea (adult) (pediatric): Secondary | ICD-10-CM | POA: Diagnosis not present

## 2018-05-14 DIAGNOSIS — R918 Other nonspecific abnormal finding of lung field: Secondary | ICD-10-CM | POA: Diagnosis not present

## 2018-05-14 DIAGNOSIS — R5383 Other fatigue: Secondary | ICD-10-CM | POA: Diagnosis not present

## 2018-05-20 DIAGNOSIS — E782 Mixed hyperlipidemia: Secondary | ICD-10-CM | POA: Diagnosis not present

## 2018-05-20 DIAGNOSIS — I10 Essential (primary) hypertension: Secondary | ICD-10-CM | POA: Diagnosis not present

## 2018-05-20 DIAGNOSIS — I482 Chronic atrial fibrillation, unspecified: Secondary | ICD-10-CM | POA: Diagnosis not present

## 2018-05-20 DIAGNOSIS — E1165 Type 2 diabetes mellitus with hyperglycemia: Secondary | ICD-10-CM | POA: Diagnosis not present

## 2018-05-20 DIAGNOSIS — I2510 Atherosclerotic heart disease of native coronary artery without angina pectoris: Secondary | ICD-10-CM | POA: Diagnosis not present

## 2018-06-17 ENCOUNTER — Ambulatory Visit (INDEPENDENT_AMBULATORY_CARE_PROVIDER_SITE_OTHER): Payer: Medicare Other | Admitting: Internal Medicine

## 2018-06-17 ENCOUNTER — Encounter: Payer: Self-pay | Admitting: Internal Medicine

## 2018-06-17 ENCOUNTER — Other Ambulatory Visit: Payer: Self-pay

## 2018-06-17 VITALS — BP 144/80 | HR 57 | Temp 97.6°F | Ht 71.0 in | Wt 244.6 lb

## 2018-06-17 DIAGNOSIS — E6609 Other obesity due to excess calories: Secondary | ICD-10-CM

## 2018-06-17 DIAGNOSIS — R600 Localized edema: Secondary | ICD-10-CM | POA: Diagnosis not present

## 2018-06-17 DIAGNOSIS — I2510 Atherosclerotic heart disease of native coronary artery without angina pectoris: Secondary | ICD-10-CM

## 2018-06-17 DIAGNOSIS — Z6834 Body mass index (BMI) 34.0-34.9, adult: Secondary | ICD-10-CM

## 2018-06-17 DIAGNOSIS — L02416 Cutaneous abscess of left lower limb: Secondary | ICD-10-CM

## 2018-06-17 DIAGNOSIS — I1310 Hypertensive heart and chronic kidney disease without heart failure, with stage 1 through stage 4 chronic kidney disease, or unspecified chronic kidney disease: Secondary | ICD-10-CM | POA: Insufficient documentation

## 2018-06-17 DIAGNOSIS — N182 Chronic kidney disease, stage 2 (mild): Secondary | ICD-10-CM

## 2018-06-17 DIAGNOSIS — G4733 Obstructive sleep apnea (adult) (pediatric): Secondary | ICD-10-CM | POA: Diagnosis not present

## 2018-06-17 DIAGNOSIS — L03116 Cellulitis of left lower limb: Secondary | ICD-10-CM

## 2018-06-17 DIAGNOSIS — E1122 Type 2 diabetes mellitus with diabetic chronic kidney disease: Secondary | ICD-10-CM

## 2018-06-17 DIAGNOSIS — R0602 Shortness of breath: Secondary | ICD-10-CM | POA: Diagnosis not present

## 2018-06-17 MED ORDER — CEPHALEXIN 500 MG PO CAPS: 500.0000 mg | ORAL_CAPSULE | Freq: Four times a day (QID) | ORAL | 0 refills | Status: AC

## 2018-06-17 MED ORDER — CEFTRIAXONE SODIUM 500 MG IJ SOLR
500.0000 mg | Freq: Once | INTRAMUSCULAR | Status: AC
Start: 2018-06-17 — End: 2018-06-17
  Administered 2018-06-17: 500 mg via INTRAMUSCULAR

## 2018-06-18 DIAGNOSIS — G4733 Obstructive sleep apnea (adult) (pediatric): Secondary | ICD-10-CM | POA: Diagnosis not present

## 2018-06-18 DIAGNOSIS — R918 Other nonspecific abnormal finding of lung field: Secondary | ICD-10-CM | POA: Diagnosis not present

## 2018-06-18 DIAGNOSIS — R5383 Other fatigue: Secondary | ICD-10-CM | POA: Diagnosis not present

## 2018-06-18 DIAGNOSIS — J4550 Severe persistent asthma, uncomplicated: Secondary | ICD-10-CM | POA: Diagnosis not present

## 2018-06-18 DIAGNOSIS — J301 Allergic rhinitis due to pollen: Secondary | ICD-10-CM | POA: Diagnosis not present

## 2018-06-18 LAB — BMP8+EGFR
BUN/Creatinine Ratio: 22 (ref 10–24)
BUN: 21 mg/dL (ref 8–27)
CALCIUM: 9.1 mg/dL (ref 8.6–10.2)
CHLORIDE: 102 mmol/L (ref 96–106)
CO2: 24 mmol/L (ref 20–29)
CREATININE: 0.94 mg/dL (ref 0.76–1.27)
GFR calc Af Amer: 93 mL/min/{1.73_m2} (ref >59)
GFR calc non Af Amer: 80 mL/min/{1.73_m2} (ref >59)
GLUCOSE: 86 mg/dL (ref 65–99)
Potassium: 4.8 mmol/L (ref 3.5–5.2)
Sodium: 142 mmol/L (ref 134–144)

## 2018-06-18 LAB — LIPID PANEL
CHOLESTEROL TOTAL: 150 mg/dL (ref 100–199)
Chol/HDL Ratio: 3.5 ratio (ref 0.0–5.0)
HDL: 43 mg/dL (ref >39)
LDL CALC: 70 mg/dL (ref 0–99)
Triglycerides: 185 mg/dL — ABNORMAL HIGH (ref 0–149)
VLDL CHOLESTEROL CAL: 37 mg/dL (ref 5–40)

## 2018-06-18 LAB — PRO B NATRIURETIC PEPTIDE: NT-Pro BNP: 126 pg/mL (ref 0–376)

## 2018-06-18 LAB — HEMOGLOBIN A1C
ESTIMATED AVERAGE GLUCOSE: 131 mg/dL
Hgb A1c MFr Bld: 6.2 % — ABNORMAL HIGH (ref 4.8–5.6)

## 2018-06-21 NOTE — Progress Notes (Signed)
Subjective:     Patient ID: Franklin Thornton , male    DOB: 03-20-1946 , 73 y.o.   MRN: [id number]   Chief Complaint  Patient presents with  . Diabetes  . Hypertension    HPI  Diabetes  He presents for his follow-up diabetic visit. He has type 2 diabetes mellitus. His disease course has been improving. There are no hypoglycemic associated symptoms. Pertinent negatives for diabetes include no blurred vision and no chest pain. There are no hypoglycemic complications. Risk factors for coronary artery disease include diabetes mellitus, dyslipidemia, hypertension, male sex, obesity and sedentary lifestyle. His breakfast blood glucose is taken between 9-10 am. His breakfast blood glucose range is generally 90-110 mg/dl. An ACE inhibitor/angiotensin II receptor blocker is being taken. Eye exam is current.  Hypertension  This is a chronic problem. The current episode started more than 1 year ago. Pertinent negatives include no blurred vision, chest pain, palpitations or shortness of breath.   Reports compliance with meds.   Past Medical History:  Diagnosis Date  . Atrial fibrillation ([place])   . Diabetes mellitus without complication ([place])   . Peripheral vascular disease ([place])   . Prostate cancer [facility])      Family History  Problem Relation Age of Onset  . Lung cancer Mother   . Diabetes Mother   . Esophageal cancer Father      Current Outpatient Medications:  .  albuterol (PROVENTIL HFA;VENTOLIN HFA) 108 (90 Base) MCG/ACT inhaler, Inhale 2 puffs into the lungs every 6 (six) hours as needed for wheezing or shortness of breath., Disp: , Rfl:  .  amiodarone (PACERONE) 200 MG tablet, Take 200 mg by mouth daily., Disp: , Rfl:  .  aspirin 81 MG tablet, Take 81 mg by mouth daily., Disp: , Rfl:  .  atorvastatin (LIPITOR) 20 MG tablet, TAKE 1 TABLET DAILY, Disp: 90 tablet, Rfl: 1 .  b complex vitamins capsule, Take 1 capsule by mouth daily., Disp: , Rfl:  .  beclomethasone (QVAR) 80 MCG/ACT  inhaler, Inhale 2 puffs into the lungs 2 (two) times daily., Disp: , Rfl:  .  carvedilol (COREG) 6.25 MG tablet, Take 6.25 mg by mouth 2 (two) times daily with a meal., Disp: , Rfl:  .  Cholecalciferol (VITAMIN D) 2000 units tablet, Take 2,000 Units by mouth daily., Disp: , Rfl:  .  DULoxetine (CYMBALTA) 30 MG capsule, TAKE 1 CAPSULE DAILY IN THE EVENING, Disp: 90 capsule, Rfl: 1 .  empagliflozin (JARDIANCE) 10 MG TABS tablet, Take 10 mg by mouth daily., Disp: , Rfl:  .  furosemide (LASIX) 20 MG tablet, Take 20 mg by mouth daily., Disp: , Rfl:  .  HYDROcodone-acetaminophen (NORCO/VICODIN) 5-325 MG tablet, Take 1 tablet by mouth every 6 (six) hours as needed for moderate pain., Disp: , Rfl:  .  ipratropium (ATROVENT) 0.03 % nasal spray, Place 2 sprays into both nostrils every 12 (twelve) hours., Disp: , Rfl:  .  levocetirizine (XYZAL) 5 MG tablet, Take 5 mg by mouth every evening., Disp: , Rfl:  .  levothyroxine (SYNTHROID, LEVOTHROID) 50 MCG tablet, TAKE 1 TABLET DAILY, Disp: 90 tablet, Rfl: 1 .  lisinopril (PRINIVIL,ZESTRIL) 20 MG tablet, , Disp: , Rfl:  .  Magnesium 250 MG TABS, Take 1 tablet by mouth daily., Disp: , Rfl:  .  mirabegron ER (MYRBETRIQ) 50 MG TB24 tablet, Take 50 mg by mouth daily., Disp: , Rfl:  .  Multiple Vitamin (MULTIVITAMIN) tablet, Take 1 tablet by mouth daily., Disp: ,  Rfl:  .  nitroGLYCERIN (NITROSTAT) 0.4 MG SL tablet, Place 0.4 mg under the tongue every 5 (five) minutes as needed for chest pain., Disp: , Rfl:  .  pantoprazole (PROTONIX) 40 MG tablet, TAKE 1 TABLET DAILY, Disp: 90 tablet, Rfl: 1 .  pramipexole (MIRAPEX) 0.5 MG tablet, Take 1 mg by mouth 3 (three) times daily. , Disp: , Rfl:  .  rivaroxaban (XARELTO) 10 MG TABS tablet, Take 10 mg by mouth daily., Disp: , Rfl:  .  vitamin B-12 (CYANOCOBALAMIN) 1000 MCG tablet, Take 1,000 mcg by mouth daily., Disp: , Rfl:  .  cephALEXin (KEFLEX) 500 MG capsule, Take 1 capsule (500 mg total) by mouth 4 (four) times daily for  5 days., Disp: 20 capsule, Rfl: 0   Allergies  Allergen Reactions  . No Known Allergies      Review of Systems  Constitutional: Negative.   Eyes: Negative for blurred vision.  Respiratory: Negative.  Negative for shortness of breath.   Cardiovascular: Negative.  Negative for chest pain and palpitations.  Gastrointestinal: Negative.   Neurological: Negative.   Psychiatric/Behavioral: Negative.      Today's Vitals   06/17/18 1421  BP: (!) 144/80  Pulse: (!) 57  Temp: 97.6 F (36.4 C)  TempSrc: Oral  SpO2: 94%  Weight: 244 lb 9.6 oz (110.9 kg)  Height: _0  (1.803 m)  PainSc: 7   PainLoc: Back   Body mass index is 34.11 kg/m.   Objective:  Physical Exam Vitals signs and nursing note reviewed.  Constitutional:      Appearance: Normal appearance.  Cardiovascular:     Rate and Rhythm: Normal rate and regular rhythm.     Heart sounds: Normal heart sounds.  Pulmonary:     Effort: Pulmonary effort is normal.     Breath sounds: Normal breath sounds.  Skin:    General: Skin is warm.     Comments: Warmth, erythema of left lower extremity. There is some edema as well.   Neurological:     General: No focal deficit present.     Mental Status: He is alert.  Psychiatric:        Mood and Affect: Mood normal.         Assessment And Plan:     1. Type 2 diabetes mellitus with stage 2 chronic kidney disease, without long-term current use of insulin ([facility])  I DISCUSSED WITH THE PATIENT AT LENGTH REGARDING THE GOALS OF GLYCEMIC CONTROL AND POSSIBLE LONG-TERM COMPLICATIONS.  I  ALSO STRESSED THE IMPORTANCE OF COMPLIANCE WITH HOME GLUCOSE MONITORING, DIETARY RESTRICTIONS INCLUDING AVOIDANCE OF SUGARY DRINKS/PROCESSED FOODS,  ALONG WITH REGULAR EXERCISE.  I  ALSO STRESSED THE IMPORTANCE OF ANNUAL EYE EXAMS, SELF FOOT CARE AND COMPLIANCE WITH OFFICE VISITS.  - BMP8+EGFR - Lipid panel - Hemoglobin A1c  2. Hypertensive heart and renal disease with renal failure, stage 1 through  stage 4 or unspecified chronic kidney disease, without heart failure  Fair control. He will continue with current meds for now. Importance of regular exercise was discussed with the patient.   3. Atherosclerosis of native coronary artery of native heart without angina pectoris  Chronic. Importance of compliance with statin use was discussed with the patient.   4. Obstructive sleep apnea  Chronic. Reports compliance with Bipap.   5. Cellulitis and abscess of left lower extremity  He was given rocephin IM and rx keflex qid. He is encouraged to complete full abx course.   - cefTRIAXone (ROCEPHIN) injection 500 mg  6. Class 1 obesity due to excess calories with serious comorbidity and body mass index (BMI) of 34.0 to 34.9 in adult  Importance of achieving optimal weight to decrease risk of cardiovascular disease and cancers was discussed with the patient in full detail. She is encouraged to start slowly - start with 10 minutes twice daily at least three to four days per week and to gradually build to 30 minutes five days weekly. She was given tips to incorporate more activity into her daily routine - take stairs when possible, park farther away from her job, grocery stores, etc.   [name], MD

## 2018-07-16 ENCOUNTER — Other Ambulatory Visit: Payer: Self-pay

## 2018-07-16 DIAGNOSIS — N182 Chronic kidney disease, stage 2 (mild): Principal | ICD-10-CM

## 2018-07-16 DIAGNOSIS — E1122 Type 2 diabetes mellitus with diabetic chronic kidney disease: Secondary | ICD-10-CM

## 2018-07-16 MED ORDER — EMPAGLIFLOZIN 10 MG PO TABS: ORAL_TABLET | ORAL | 1 refills | Status: DC

## 2018-07-16 MED ORDER — EMPAGLIFLOZIN 10 MG PO TABS
ORAL_TABLET | ORAL | 1 refills | Status: DC
Start: 2018-07-16 — End: 2018-07-16

## 2018-07-16 NOTE — Telephone Encounter (Signed)
Patient notified jardiance faxed to [facility] and [facility].

## 2018-07-30 ENCOUNTER — Encounter: Payer: Self-pay | Admitting: Internal Medicine

## 2018-07-30 ENCOUNTER — Other Ambulatory Visit: Payer: Self-pay

## 2018-07-30 ENCOUNTER — Ambulatory Visit (INDEPENDENT_AMBULATORY_CARE_PROVIDER_SITE_OTHER): Payer: Medicare Other | Admitting: Internal Medicine

## 2018-07-30 VITALS — BP 144/86 | HR 64 | Temp 97.5°F | Ht 71.0 in | Wt 234.8 lb

## 2018-07-30 DIAGNOSIS — R350 Frequency of micturition: Secondary | ICD-10-CM | POA: Diagnosis not present

## 2018-07-30 DIAGNOSIS — G8929 Other chronic pain: Secondary | ICD-10-CM

## 2018-07-30 DIAGNOSIS — I1310 Hypertensive heart and chronic kidney disease without heart failure, with stage 1 through stage 4 chronic kidney disease, or unspecified chronic kidney disease: Secondary | ICD-10-CM

## 2018-07-30 DIAGNOSIS — M545 Low back pain, unspecified: Secondary | ICD-10-CM

## 2018-07-30 DIAGNOSIS — Z6832 Body mass index (BMI) 32.0-32.9, adult: Secondary | ICD-10-CM

## 2018-07-30 DIAGNOSIS — E6609 Other obesity due to excess calories: Secondary | ICD-10-CM | POA: Diagnosis not present

## 2018-07-30 DIAGNOSIS — M25552 Pain in left hip: Secondary | ICD-10-CM | POA: Diagnosis not present

## 2018-07-30 DIAGNOSIS — N182 Chronic kidney disease, stage 2 (mild): Secondary | ICD-10-CM | POA: Diagnosis not present

## 2018-07-30 DIAGNOSIS — E1122 Type 2 diabetes mellitus with diabetic chronic kidney disease: Secondary | ICD-10-CM | POA: Diagnosis not present

## 2018-07-30 LAB — POCT UA - MICROALBUMIN
Albumin/Creatinine Ratio, Urine, POC: <30
Creatinine, POC: 200 mg/dL
Microalbumin Ur, POC: 30 mg/L

## 2018-07-30 LAB — POCT URINALYSIS DIPSTICK
Bilirubin, UA: NEGATIVE
Blood, UA: NEGATIVE
Glucose, UA: NEGATIVE
Ketones, UA: NEGATIVE
Leukocytes, UA: NEGATIVE
Nitrite, UA: NEGATIVE
Protein, UA: NEGATIVE
Spec Grav, UA: 1.02 (ref 1.010–1.025)
Urobilinogen, UA: 0.2 E.U./dL
pH, UA: 5.5 (ref 5.0–8.0)

## 2018-07-30 MED ORDER — LIDOCAINE 5 % EX PTCH: 3.0000 | MEDICATED_PATCH | CUTANEOUS | 0 refills | Status: DC

## 2018-07-30 NOTE — Patient Instructions (Signed)

## 2018-07-31 LAB — CK: Total CK: 113 U/L (ref 24–204)

## 2018-07-31 LAB — ANTINUCLEAR ANTIBODIES, IFA: ANA Titer 1: NEGATIVE

## 2018-07-31 LAB — URIC ACID: Uric Acid: 5.7 mg/dL (ref 3.7–8.6)

## 2018-07-31 LAB — CYCLIC CITRUL PEPTIDE ANTIBODY, IGG/IGA: Cyclic Citrullin Peptide Ab: 34 units — ABNORMAL HIGH (ref 0–19)

## 2018-07-31 LAB — RHEUMATOID FACTOR: Rhuematoid fact SerPl-aCnc: <10 IU/mL (ref 0.0–13.9)

## 2018-07-31 LAB — SEDIMENTATION RATE: Sed Rate: 14 mm/hr (ref 0–30)

## 2018-08-01 NOTE — Progress Notes (Signed)
Subjective:     Patient ID: Franklin Thornton , male    DOB: 01/07/1946 , 73 y.o.   MRN: [id number]   Chief Complaint  Patient presents with  . Diabetes  . Hypertension    HPI  He is here today for f/u diabetes. He is tolerating all meds w/o complications.   Hypertension  This is a chronic problem. The current episode started more than 1 year ago. The problem has been gradually improving since onset. The problem is controlled. Pertinent negatives include no blurred vision, chest pain, palpitations or shortness of breath.   Reports compliance with meds.   Past Medical History:  Diagnosis Date  . Atrial fibrillation ([place])   . Diabetes mellitus without complication ([place])   . Peripheral vascular disease ([place])   . Prostate cancer [facility])      Family History  Problem Relation Age of Onset  . Lung cancer Mother   . Diabetes Mother   . Esophageal cancer Father      Current Outpatient Medications:  .  albuterol (PROVENTIL HFA;VENTOLIN HFA) 108 (90 Base) MCG/ACT inhaler, Inhale 2 puffs into the lungs every 6 (six) hours as needed for wheezing or shortness of breath., Disp: , Rfl:  .  amiodarone (PACERONE) 200 MG tablet, Take 200 mg by mouth daily., Disp: , Rfl:  .  aspirin 81 MG tablet, Take 81 mg by mouth daily., Disp: , Rfl:  .  atorvastatin (LIPITOR) 20 MG tablet, TAKE 1 TABLET DAILY, Disp: 90 tablet, Rfl: 1 .  b complex vitamins capsule, Take 1 capsule by mouth daily., Disp: , Rfl:  .  beclomethasone (QVAR) 80 MCG/ACT inhaler, Inhale 2 puffs into the lungs 2 (two) times daily., Disp: , Rfl:  .  carvedilol (COREG) 6.25 MG tablet, Take 6.25 mg by mouth 2 (two) times daily with a meal., Disp: , Rfl:  .  Cholecalciferol (VITAMIN D) 2000 units tablet, Take 2,000 Units by mouth daily., Disp: , Rfl:  .  DULoxetine (CYMBALTA) 30 MG capsule, TAKE 1 CAPSULE DAILY IN THE EVENING, Disp: 90 capsule, Rfl: 1 .  empagliflozin (JARDIANCE) 10 MG TABS tablet, Take 1 tablet by mouth daily, Disp: 90  tablet, Rfl: 1 .  furosemide (LASIX) 20 MG tablet, Take 20 mg by mouth daily., Disp: , Rfl:  .  HYDROcodone-acetaminophen (NORCO/VICODIN) 5-325 MG tablet, Take 1 tablet by mouth every 6 (six) hours as needed for moderate pain., Disp: , Rfl:  .  ipratropium (ATROVENT) 0.03 % nasal spray, Place 2 sprays into both nostrils every 12 (twelve) hours., Disp: , Rfl:  .  levocetirizine (XYZAL) 5 MG tablet, Take 5 mg by mouth every evening., Disp: , Rfl:  .  levothyroxine (SYNTHROID, LEVOTHROID) 50 MCG tablet, TAKE 1 TABLET DAILY, Disp: 90 tablet, Rfl: 1 .  lisinopril (PRINIVIL,ZESTRIL) 20 MG tablet, , Disp: , Rfl:  .  Magnesium 250 MG TABS, Take 1 tablet by mouth daily., Disp: , Rfl:  .  mirabegron ER (MYRBETRIQ) 50 MG TB24 tablet, Take 50 mg by mouth daily., Disp: , Rfl:  .  Multiple Vitamin (MULTIVITAMIN) tablet, Take 1 tablet by mouth daily., Disp: , Rfl:  .  nitroGLYCERIN (NITROSTAT) 0.4 MG SL tablet, Place 0.4 mg under the tongue every 5 (five) minutes as needed for chest pain., Disp: , Rfl:  .  pantoprazole (PROTONIX) 40 MG tablet, TAKE 1 TABLET DAILY, Disp: 90 tablet, Rfl: 1 .  pramipexole (MIRAPEX) 0.5 MG tablet, Take 1 mg by mouth 3 (three) times daily. , Disp: ,  Rfl:  .  rivaroxaban (XARELTO) 10 MG TABS tablet, Take 10 mg by mouth daily., Disp: , Rfl:  .  vitamin B-12 (CYANOCOBALAMIN) 1000 MCG tablet, Take 1,000 mcg by mouth daily., Disp: , Rfl:  .  lidocaine (LIDODERM) 5 %, Place 3 patches onto the skin daily. May use 1-3 patches at one time, Remove & Discard patch within 12 hours or as directed by MD, Disp: 90 patch, Rfl: 0   Allergies  Allergen Reactions  . No Known Allergies      Review of Systems  Constitutional: Negative.   Eyes: Negative for blurred vision.  Respiratory: Negative.  Negative for shortness of breath.   Cardiovascular: Negative.  Negative for chest pain and palpitations.  Gastrointestinal: Negative.   Musculoskeletal: Positive for arthralgias and back pain.   Neurological: Negative.   Psychiatric/Behavioral: Negative.      Today's Vitals   07/30/18 1056  BP: (!) 144/86  Pulse: 64  Temp: (!) 97.5 F (36.4 C)  TempSrc: Oral  Weight: 234 lb 12.8 oz (106.5 kg)  Height: 5\' 11"  (1.803 m)  PainSc: 10-Worst pain ever  PainLoc: Hip   Body mass index is 32.75 kg/m.   Objective:  Physical Exam Vitals signs and nursing note reviewed.  Constitutional:      Appearance: Normal appearance.  Cardiovascular:     Rate and Rhythm: Normal rate and regular rhythm.     Heart sounds: Normal heart sounds.  Pulmonary:     Effort: Pulmonary effort is normal.     Breath sounds: Normal breath sounds.  Musculoskeletal:        General: Tenderness present.     Lumbar back: He exhibits tenderness.     Comments: Left hip tenderness to palpation. No overlying erythema. Decreased ROM due to pain.   Skin:    General: Skin is warm.  Neurological:     General: No focal deficit present.     Mental Status: He is alert.  Psychiatric:        Mood and Affect: Mood normal.         Assessment And Plan:     1. Type 2 diabetes mellitus with stage 2 chronic kidney disease, without long-term current use of insulin ([place])  He will continue with current meds. Previous a1c results were reviewed. He is encouraged to incorporate more exercise into his daily routine.   - POCT UA - Microalbumin  2. Hypertensive heart and renal disease with renal failure, stage 1 through stage 4 or unspecified chronic kidney disease, without heart failure  Fair control. Today's elevation is likely related to his hip/back pain. He will continue with current meds for now.   3. Left hip pain  He requests referral to Orthopedics. I advised him that his symptoms could be related to his back pain. He was also given rx lidocaine patches. Pt advised this rx may require prior auth. He is encouraged to get OTC lidocaine patches to hold him until medication is approved.   - Ambulatory referral  to Orthopedic Surgery  4. Urinary frequency  I will check u/a today. Pt advised this is likely due to use of Jardiance.   - POCT Urinalysis Dipstick (81002)  5. Chronic bilateral low back pain without sciatica  Chronic. This is followed by the [place]. He reports he has not had any relief in symptoms despite use of narcotics prescribed by the [place]. I will check an arthritis panel. I will make further recommendations once his labs are available for review.   -  CK, total - ANA, IFA (with reflex) - CYCLIC CITRUL PEPTIDE ANTIBODY, IGG/IGA - Rheumatoid factor - Sedimentation rate - Uric acid  6. Class 1 obesity due to excess calories with serious comorbidity and body mass index (BMI) of 32.0 to 32.9 in adult  He was congratulated on his ten pound weight loss.  He reports making better food choices. He is encouraged to incorporate more exercise into his daily routine. He is encouraged to strive for BMI less than 28 to decrease cardiac risk.   [name], MD    THE PATIENT IS ENCOURAGED TO PRACTICE SOCIAL DISTANCING DUE TO THE COVID-19 PANDEMIC.

## 2018-08-03 ENCOUNTER — Other Ambulatory Visit: Payer: Self-pay

## 2018-08-03 DIAGNOSIS — M1990 Unspecified osteoarthritis, unspecified site: Secondary | ICD-10-CM

## 2018-08-05 ENCOUNTER — Other Ambulatory Visit: Payer: Self-pay

## 2018-08-05 DIAGNOSIS — M25552 Pain in left hip: Secondary | ICD-10-CM

## 2018-08-13 ENCOUNTER — Telehealth: Payer: Self-pay

## 2018-08-13 NOTE — Telephone Encounter (Signed)
The pt was notified that he can go have his left hip x-ray and that the referral specialist was told that he didn't need an appt.

## 2018-08-13 NOTE — Telephone Encounter (Signed)
The pt was asked if he has has his left hip x-ray done and the pt said no he hasn't heard from anyone.  I notified the referral specialist the pt hasn't heard about his left hip x-ray.

## 2018-08-14 ENCOUNTER — Other Ambulatory Visit: Payer: Self-pay | Admitting: Internal Medicine

## 2018-08-14 DIAGNOSIS — M160 Bilateral primary osteoarthritis of hip: Secondary | ICD-10-CM | POA: Diagnosis not present

## 2018-08-14 DIAGNOSIS — M25552 Pain in left hip: Secondary | ICD-10-CM | POA: Diagnosis not present

## 2018-08-17 ENCOUNTER — Telehealth: Payer: Self-pay

## 2018-08-17 NOTE — Telephone Encounter (Signed)
The patient was notified that his pelvic and hips x-ray shows arthritis in spine and both hips.

## 2018-08-18 ENCOUNTER — Telehealth: Payer: Self-pay

## 2018-08-18 DIAGNOSIS — M5136 Other intervertebral disc degeneration, lumbar region: Secondary | ICD-10-CM

## 2018-08-18 NOTE — Telephone Encounter (Signed)
The pt informed that his x-ray shows more arthritis in his left hip then in his spine and that it would be better for the pt to see a Dr. Francesco Runner instead of seeing Dr. Rex Kras.  The pt agreed to go to see Dr. Francesco Runner in [place].

## 2018-08-19 DIAGNOSIS — E782 Mixed hyperlipidemia: Secondary | ICD-10-CM | POA: Diagnosis not present

## 2018-08-19 DIAGNOSIS — E1165 Type 2 diabetes mellitus with hyperglycemia: Secondary | ICD-10-CM | POA: Diagnosis not present

## 2018-08-19 DIAGNOSIS — I2510 Atherosclerotic heart disease of native coronary artery without angina pectoris: Secondary | ICD-10-CM | POA: Diagnosis not present

## 2018-08-19 DIAGNOSIS — I482 Chronic atrial fibrillation, unspecified: Secondary | ICD-10-CM | POA: Diagnosis not present

## 2018-08-19 DIAGNOSIS — I10 Essential (primary) hypertension: Secondary | ICD-10-CM | POA: Diagnosis not present

## 2018-08-26 DIAGNOSIS — E1142 Type 2 diabetes mellitus with diabetic polyneuropathy: Secondary | ICD-10-CM | POA: Diagnosis not present

## 2018-08-26 DIAGNOSIS — M47816 Spondylosis without myelopathy or radiculopathy, lumbar region: Secondary | ICD-10-CM | POA: Diagnosis not present

## 2018-08-26 DIAGNOSIS — Z8546 Personal history of malignant neoplasm of prostate: Secondary | ICD-10-CM | POA: Diagnosis not present

## 2018-08-26 DIAGNOSIS — M4180 Other forms of scoliosis, site unspecified: Secondary | ICD-10-CM | POA: Diagnosis not present

## 2018-08-26 DIAGNOSIS — G894 Chronic pain syndrome: Secondary | ICD-10-CM | POA: Diagnosis not present

## 2018-08-26 DIAGNOSIS — M5136 Other intervertebral disc degeneration, lumbar region: Secondary | ICD-10-CM | POA: Diagnosis not present

## 2018-08-26 DIAGNOSIS — M545 Low back pain: Secondary | ICD-10-CM | POA: Diagnosis not present

## 2018-08-26 DIAGNOSIS — M4316 Spondylolisthesis, lumbar region: Secondary | ICD-10-CM | POA: Diagnosis not present

## 2018-08-27 ENCOUNTER — Other Ambulatory Visit: Payer: Self-pay

## 2018-08-27 ENCOUNTER — Telehealth: Payer: Self-pay

## 2018-08-27 DIAGNOSIS — E1122 Type 2 diabetes mellitus with diabetic chronic kidney disease: Secondary | ICD-10-CM

## 2018-08-27 DIAGNOSIS — N182 Chronic kidney disease, stage 2 (mild): Principal | ICD-10-CM

## 2018-08-27 MED ORDER — EMPAGLIFLOZIN 10 MG PO TABS
ORAL_TABLET | ORAL | 1 refills | Status: DC
Start: 2018-08-27 — End: 2019-05-07

## 2018-08-27 NOTE — Telephone Encounter (Signed)
Left the pt a message that his Vania Rea is covered by his insurance and that no further PA is needed.

## 2018-08-27 NOTE — Telephone Encounter (Signed)
Prior authorization started for Jardiance 10 mg

## 2018-09-04 NOTE — Progress Notes (Signed)
Office Visit Note  Patient: Franklin Thornton             Date of Birth: 11/02/1945           MRN: [id number]             PCP: [name], MD Referring: [name], MD Visit Date: 09/15/2018 Occupation: @GUAROCC @  Subjective:  Pain in multiple joints   History of Present Illness: DJ SENTENO is a 73 y.o. male seen in consultation per request of his PCP.  According to patient his symptoms are started in late 70s with lower back pain.  He has been under care of Dr. Melven Sartorius and had several lower back injections.  He states he saw him a week ago and doing much better after the last cortisone injection.  He has been having stiffness and pain in his cervical spine for over 10 years which is painful.  He has been also experiencing stiffness in his hands.  He states he has noticed twisting of his right Dex finger distal phalanx.  He also has lower back SI joint pain.  He states he has some discomfort over the left trochanteric bursa which is resolved now.  He denies any joint swelling.  He states he might have had psoriasis as a child for which she had topical cream.  He denies any rash now.  There is no family history of autoimmune disease.  Activities of Daily Living:  Patient reports morning stiffness for all day hours.   Patient Reports nocturnal pain.  Difficulty dressing/grooming: Reports Difficulty climbing stairs: Reports Difficulty getting out of chair: Reports Difficulty using hands for taps, buttons, cutlery, and/or writing: Denies  Review of Systems  Constitutional: Positive for fatigue. Negative for night sweats.  HENT: Positive for mouth dryness. Negative for mouth sores, loss of taste and nose dryness.   Eyes: Negative for redness and dryness.  Respiratory: Negative for shortness of breath and difficulty breathing.   Cardiovascular: Negative for chest pain, palpitations, hypertension, irregular heartbeat and swelling in legs/feet.  Gastrointestinal: Positive for  constipation. Negative for diarrhea.  Endocrine: Negative for increased urination.  Musculoskeletal: Positive for arthralgias, joint pain and morning stiffness. Negative for joint swelling, myalgias, muscle weakness, muscle tenderness and myalgias.  Skin: Negative for color change, rash, hair loss, nodules/bumps, skin tightness, ulcers and sensitivity to sunlight.  Allergic/Immunologic: Negative for susceptible to infections.  Neurological: Negative for dizziness, fainting, memory loss, night sweats and weakness ( ).  Hematological: Negative for swollen glands.  Psychiatric/Behavioral: Negative for depressed mood and sleep disturbance. The patient is not nervous/anxious.     PMFS History:  Patient Active Problem List   Diagnosis Date Noted  . Type 2 diabetes mellitus with stage 2 chronic kidney disease, without long-term current use of insulin ([place]) 06/17/2018  . 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  . Atherosclerosis of native coronary artery of native heart without angina pectoris 06/17/2018  . Arrhythmia 11/18/2017  . Hyperlipidemia 11/18/2017  . Hypertension 11/18/2017  . Fall (on)(from) sidewalk curb, initial encounter 11/03/2015  . Varicose veins of left lower extremity with complications 86/76/7209  . Swelling of limb 08/14/2015  . Pain in both lower extremities 08/14/2015  . Globus pharyngeus 07/17/2015  . Uvular edema 07/17/2015  . Abdominal pain 12/12/2013  . History of carcinoma in situ of prostate 12/12/2013  . Male erectile dysfunction 12/12/2013  . Personal history of prostate cancer 12/12/2013  . OAB (overactive bladder) 11/03/2013  . Malignant neoplasm of prostate (  [place]) 11/03/2013  . Nocturia 11/03/2013  . Stress incontinence 11/03/2013  . Urinary incontinence 11/03/2013  . Headache associated with sexual activity 12/14/2012  . Memory loss 12/14/2012  . Myalgia and myositis 12/14/2012  . Obstructive sleep apnea 12/14/2012  . Insomnia 12/14/2012  .  Partial epilepsy with impairment of consciousness, with intractable epilepsy ([place]) 12/14/2012  . Restless legs syndrome 12/14/2012  . Tension type headache 12/14/2012  . Acute renal failure ([place]) 07/11/2012  . AKI (acute kidney injury) ([place]) 07/11/2012  . Altered mental status 07/11/2012  . Dehydration, severe 07/11/2012  . Metabolic acidosis 03/47/4259  . Noninfective gastroenteritis and colitis 07/11/2012    Past Medical History:  Diagnosis Date  . Atrial fibrillation ([place])   . Diabetes mellitus without complication ([place])   . Peripheral vascular disease ([place])   . Prostate cancer [facility])     Family History  Problem Relation Age of Onset  . Lung cancer Mother   . Diabetes Mother   . Esophageal cancer Father   . Hypertension Sister   . Breast cancer Sister   . Healthy Son   . Healthy Daughter    Past Surgical History:  Procedure Laterality Date  . CORONARY ANGIOPLASTY WITH STENT PLACEMENT     x2  . ENDOVENOUS ABLATION SAPHENOUS VEIN W/ LASER Left 08/19/2017   endovenous laser ablation left greater saphenous vein by Tinnie Gens MD   . EXTERNAL EAR SURGERY  2020   right ear   . UVULECTOMY  07/31/15   Social History   Social History Narrative  . Not on file   Immunization History  Administered Date(s) Administered  . Tdap 11/03/2015     Objective: Vital Signs: BP 112/64 (BP Location: Right Arm, Patient Position: Sitting, Cuff Size: Normal)   Pulse 77   Resp 15   Ht 5\' 11"  (1.803 m)   Wt 235 lb (106.6 kg)   BMI 32.78 kg/m    Physical Exam Vitals signs and nursing note reviewed.  Constitutional:      Appearance: He is well-developed.  HENT:     Head: Normocephalic and atraumatic.  Eyes:     Conjunctiva/sclera: Conjunctivae normal.     Pupils: Pupils are equal, round, and reactive to light.  Neck:     Musculoskeletal: Normal range of motion and neck supple.  Cardiovascular:     Rate and Rhythm: Normal rate and regular rhythm.     Heart sounds: Normal heart  sounds.  Pulmonary:     Effort: Pulmonary effort is normal.     Breath sounds: Normal breath sounds.  Abdominal:     General: Bowel sounds are normal.     Palpations: Abdomen is soft.  Skin:    General: Skin is warm and dry.     Capillary Refill: Capillary refill takes less than 2 seconds.  Neurological:     Mental Status: He is alert and oriented to person, place, and time.  Psychiatric:        Behavior: Behavior normal.      Musculoskeletal Exam: C-spine limited lateral rotation flexion and extension.  Shoulder joints elbow joints wrist joints with good range of motion.  He had PIP and DIP thickening and subluxation of the right first DIP.  Subluxation of bilateral first MCP joint was also noted.  No synovitis was noted.  Hip joints, knee joints, ankles were in good range of motion.  He has some osteoarthritic changes in his PIPs and DIPs of his feet.  CDAI Exam: CDAI Score: Not  documented Patient Global Assessment: Not documented; Provider Global Assessment: Not documented Swollen: Not documented; Tender: Not documented Joint Exam   Not documented   There is currently no information documented on the homunculus. Go to the Rheumatology activity and complete the homunculus joint exam.  Investigation: Findings:  07/30/18: ANA negative, CK 113, CCP 34, RF <10, Sed rate 14, uric acid 5.7  Component     Latest Ref Rng & Units 07/30/2018  CK Total     24 - 204 U/L 113  ANA Titer 1      Negative  Cyclic Citrullin Peptide Ab     0 - 19 units 34 (H)  RA Latex Turbid.     0.0 - 13.9 IU/mL <10.0  Sed Rate     0 - 30 mm/hr 14  Uric Acid     3.7 - 8.6 mg/dL 5.7   Imaging: Xr Cervical Spine 2 Or 3 Views  Result Date: 09/15/2018 Multilevel spondylosis was noted.  Severe narrowing between C5-6 and C6-7 was noted.  C5-6 spondylolisthesis thesis was noted.  Anterior spurring at C5-6 was noted. 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  Xr Hand 2 View Left  Result Date: 09/15/2018 First MCP narrowing and subluxation was noted.  PIP and DIP narrowing was noted.  Posttraumatic change was noted in the fourth metatarsal head.  No MCP intercarpal or radiocarpal joint space narrowing was noted.  No erosive changes were noted. Impression: These findings are consistent with osteoarthritis of the hand.  Xr Hand 2 View Right  Result Date: 09/15/2018 PIP and DIP narrowing was noted.  Subluxation of right first MCP joint was noted.  Subluxation of right first DIP joint was noted.  Some third MCP joint narrowing was noted.  No intercarpal or radiocarpal joint space narrowing was noted.  No erosive changes were noted.  No juxta-articular osteopenia was noted. Impression: These findings are consistent with osteoarthritis of the hand.  Xr Pelvis 1-2 Views  Result Date: 09/15/2018 No SI joint sclerosis or narrowing was noted. Unremarkable x-ray of the SI joints.   Recent Labs: Lab Results  Component Value Date   WBC 7.1 03/17/2018   HGB 14.7 03/17/2018   PLT 247 03/17/2018   NA 142 06/17/2018   K 4.8 06/17/2018   CL 102 06/17/2018   CO2 24 06/17/2018   GLUCOSE 86 06/17/2018   BUN 21 06/17/2018   CREATININE 0.94 06/17/2018   BILITOT 0.6 03/17/2018   ALKPHOS 63 03/17/2018   AST 20 03/17/2018   ALT 16 03/17/2018   PROT 6.6 03/17/2018   ALBUMIN 4.0 03/17/2018   CALCIUM 9.1 06/17/2018   GFRAA 93 06/17/2018    Speciality Comments: No specialty comments available.  Procedures:  No procedures performed Allergies: No known allergies   Assessment / Plan:     Visit Diagnoses: Polyarthralgia - 07/30/18: ANA negative, CK 113, CCP 34, RF <10, Sed rate 14, uric acid 5.7.  Patient has weakly positive anti-CCP antibody.  He had no clinical features of rheumatoid arthritis.  I have advised him to contact me in case he develops increased inflammation in his joints.  Sometimes anti-CCP antibody can be seen several years  prior to onset of rheumatoid arthritis.  Anti-CCP antibody can be seen in association with smoking.  He was a former smoker.  Neck pain -patient complains of a lot of pain and discomfort in his cervical spine over 10 years.  He has limited range of motion.  Plan: XR Cervical Spine 2 or 3 views.  The x-ray of the cervical spine showed multilevel spondylosis with severe narrowing between C5-6 and C6-7 and spondylolisthesis between C5-6.  He sees Dr. Melven Sartorius cervical spine.  Have given him a copy of his cervical spine x-rays.  Pain in both hands -he complains of a stiffness in his bilateral hands.  No synovitis or swelling was noted.  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  The clinical findings and x-ray findings are consistent with osteoarthritis.  He has subluxation of his right index finger DIP joint which is bothersome.  I will refer him to hand rehab center for ring splint.  Plan: XR Hand 2 ViewAmbulatory referral to Hand Surgery  Chronic SI joint pain -he complains of SI joint pain.  He also believes he might have had psoriasis as a child.  The SI joint x-rays were unremarkable. LeftPlan: XR Pelvis 1-2 Views, XR Hand 2 View Right,  DDD (degenerative disc disease), lumbar-patient gives longstanding history of degenerative disc disease of lumbar spine for which he has been followed by pain management.  He states that he has had many lumbar spine injections which is been helpful.  He had one recently which is relieved his lower back discomfort.  Other medical problems are listed as follows:  Essential hypertension  Hypertensive heart and renal disease with renal failure, stage 1 through stage 4 or unspecified chronic kidney disease, without heart failure  Varicose veins of left lower extremity with complications  Obstructive sleep apnea  Type 2 diabetes mellitus with stage 2 chronic kidney disease, without long-term current use of insulin ([facility])  Malignant neoplasm of prostate ([facility])   Restless legs syndrome  Atherosclerosis of native coronary artery of native heart without angina pectoris  History of hyperlipidemia   Orders: Orders Placed This Encounter  Procedures  . XR Cervical Spine 2 or 3 views  . XR Pelvis 1-2 Views  . XR Hand 2 View Right  . XR Hand 2 View Left  . Ambulatory referral to Hand Surgery   No orders of the defined types were placed in this encounter.   Face-to-face time spent with patient was 50 minutes. Greater than 50% of time was spent in counseling and coordination of care.  Follow-Up Instructions: Return if symptoms worsen or fail to improve, for Osteoarthritis.   Bo Merino, MD  Note - This record has been created using Editor, commissioning.  Chart creation errors have been sought, but may not always  have been located. Such creation errors do not reflect on  the standard of medical care.

## 2018-09-08 DIAGNOSIS — M47816 Spondylosis without myelopathy or radiculopathy, lumbar region: Secondary | ICD-10-CM | POA: Diagnosis not present

## 2018-09-15 ENCOUNTER — Other Ambulatory Visit: Payer: Self-pay

## 2018-09-15 ENCOUNTER — Ambulatory Visit: Payer: Self-pay

## 2018-09-15 ENCOUNTER — Ambulatory Visit (INDEPENDENT_AMBULATORY_CARE_PROVIDER_SITE_OTHER): Payer: Medicare Other | Admitting: Rheumatology

## 2018-09-15 ENCOUNTER — Encounter: Payer: Self-pay | Admitting: Rheumatology

## 2018-09-15 ENCOUNTER — Ambulatory Visit (INDEPENDENT_AMBULATORY_CARE_PROVIDER_SITE_OTHER): Payer: Medicare Other

## 2018-09-15 VITALS — BP 112/64 | HR 77 | Resp 15 | Ht 71.0 in | Wt 235.0 lb

## 2018-09-15 DIAGNOSIS — M25542 Pain in joints of left hand: Secondary | ICD-10-CM | POA: Diagnosis not present

## 2018-09-15 DIAGNOSIS — M25541 Pain in joints of right hand: Secondary | ICD-10-CM | POA: Diagnosis not present

## 2018-09-15 DIAGNOSIS — M5136 Other intervertebral disc degeneration, lumbar region: Secondary | ICD-10-CM

## 2018-09-15 DIAGNOSIS — M542 Cervicalgia: Secondary | ICD-10-CM | POA: Diagnosis not present

## 2018-09-15 DIAGNOSIS — E1122 Type 2 diabetes mellitus with diabetic chronic kidney disease: Secondary | ICD-10-CM

## 2018-09-15 DIAGNOSIS — I1310 Hypertensive heart and chronic kidney disease without heart failure, with stage 1 through stage 4 chronic kidney disease, or unspecified chronic kidney disease: Secondary | ICD-10-CM

## 2018-09-15 DIAGNOSIS — M79641 Pain in right hand: Secondary | ICD-10-CM

## 2018-09-15 DIAGNOSIS — G4733 Obstructive sleep apnea (adult) (pediatric): Secondary | ICD-10-CM

## 2018-09-15 DIAGNOSIS — M533 Sacrococcygeal disorders, not elsewhere classified: Secondary | ICD-10-CM | POA: Diagnosis not present

## 2018-09-15 DIAGNOSIS — I2510 Atherosclerotic heart disease of native coronary artery without angina pectoris: Secondary | ICD-10-CM

## 2018-09-15 DIAGNOSIS — G8929 Other chronic pain: Secondary | ICD-10-CM

## 2018-09-15 DIAGNOSIS — C61 Malignant neoplasm of prostate: Secondary | ICD-10-CM | POA: Diagnosis not present

## 2018-09-15 DIAGNOSIS — I10 Essential (primary) hypertension: Secondary | ICD-10-CM

## 2018-09-15 DIAGNOSIS — M25552 Pain in left hip: Secondary | ICD-10-CM | POA: Diagnosis not present

## 2018-09-15 DIAGNOSIS — M2550 Pain in unspecified joint: Secondary | ICD-10-CM | POA: Diagnosis not present

## 2018-09-15 DIAGNOSIS — Z8639 Personal history of other endocrine, nutritional and metabolic disease: Secondary | ICD-10-CM

## 2018-09-15 DIAGNOSIS — I83892 Varicose veins of left lower extremities with other complications: Secondary | ICD-10-CM | POA: Diagnosis not present

## 2018-09-15 DIAGNOSIS — G2581 Restless legs syndrome: Secondary | ICD-10-CM

## 2018-09-15 DIAGNOSIS — N182 Chronic kidney disease, stage 2 (mild): Secondary | ICD-10-CM

## 2018-09-15 DIAGNOSIS — M79642 Pain in left hand: Secondary | ICD-10-CM

## 2018-09-17 DIAGNOSIS — R05 Cough: Secondary | ICD-10-CM | POA: Diagnosis not present

## 2018-09-17 DIAGNOSIS — R918 Other nonspecific abnormal finding of lung field: Secondary | ICD-10-CM | POA: Diagnosis not present

## 2018-09-17 DIAGNOSIS — R5383 Other fatigue: Secondary | ICD-10-CM | POA: Diagnosis not present

## 2018-09-17 DIAGNOSIS — G2581 Restless legs syndrome: Secondary | ICD-10-CM | POA: Diagnosis not present

## 2018-09-17 DIAGNOSIS — J301 Allergic rhinitis due to pollen: Secondary | ICD-10-CM | POA: Diagnosis not present

## 2018-09-17 DIAGNOSIS — J4520 Mild intermittent asthma, uncomplicated: Secondary | ICD-10-CM | POA: Diagnosis not present

## 2018-09-17 DIAGNOSIS — G4733 Obstructive sleep apnea (adult) (pediatric): Secondary | ICD-10-CM | POA: Diagnosis not present

## 2018-09-21 ENCOUNTER — Other Ambulatory Visit: Payer: Medicare Other

## 2018-09-21 ENCOUNTER — Other Ambulatory Visit: Payer: Self-pay

## 2018-09-21 DIAGNOSIS — D649 Anemia, unspecified: Secondary | ICD-10-CM | POA: Diagnosis not present

## 2018-09-21 DIAGNOSIS — Z79899 Other long term (current) drug therapy: Secondary | ICD-10-CM | POA: Diagnosis not present

## 2018-09-21 NOTE — Addendum Note (Signed)
Addended by: Michelle Nasuti on: 09/21/2018 11:50 AM   Modules accepted: Orders

## 2018-09-22 LAB — CBC WITH DIFFERENTIAL/PLATELET
Basophils Absolute: 0 10*3/uL (ref 0.0–0.2)
Basos: 1 %
EOS (ABSOLUTE): 0.3 10*3/uL (ref 0.0–0.4)
Eos: 4 %
Hematocrit: 47.7 % (ref 37.5–51.0)
Hemoglobin: 15.8 g/dL (ref 13.0–17.7)
Immature Grans (Abs): 0 10*3/uL (ref 0.0–0.1)
Immature Granulocytes: 0 %
Lymphocytes Absolute: 1.2 10*3/uL (ref 0.7–3.1)
Lymphs: 15 %
MCH: 30 pg (ref 26.6–33.0)
MCHC: 33.1 g/dL (ref 31.5–35.7)
MCV: 91 fL (ref 79–97)
Monocytes Absolute: 0.6 10*3/uL (ref 0.1–0.9)
Monocytes: 7 %
Neutrophils Absolute: 5.9 10*3/uL (ref 1.4–7.0)
Neutrophils: 73 %
Platelets: 239 10*3/uL (ref 150–450)
RBC: 5.27 x10E6/uL (ref 4.14–5.80)
RDW: 13.1 % (ref 11.6–15.4)
WBC: 8 10*3/uL (ref 3.4–10.8)

## 2018-09-22 LAB — IRON AND TIBC
Iron Saturation: 25 % (ref 15–55)
Iron: 105 ug/dL (ref 38–169)
Total Iron Binding Capacity: 412 ug/dL (ref 250–450)
UIBC: 307 ug/dL (ref 111–343)

## 2018-09-22 LAB — FERRITIN: Ferritin: 50 ng/mL (ref 30–400)

## 2018-09-22 LAB — FOLATE: Folate: >20 ng/mL (ref >3.0)

## 2018-10-05 DIAGNOSIS — M151 Heberden's nodes (with arthropathy): Secondary | ICD-10-CM | POA: Diagnosis not present

## 2018-10-05 DIAGNOSIS — M79644 Pain in right finger(s): Secondary | ICD-10-CM | POA: Diagnosis not present

## 2018-10-05 DIAGNOSIS — M25641 Stiffness of right hand, not elsewhere classified: Secondary | ICD-10-CM | POA: Diagnosis not present

## 2018-10-08 ENCOUNTER — Other Ambulatory Visit: Payer: Self-pay | Admitting: Internal Medicine

## 2018-10-19 DIAGNOSIS — E1169 Type 2 diabetes mellitus with other specified complication: Secondary | ICD-10-CM | POA: Diagnosis not present

## 2018-10-19 DIAGNOSIS — R072 Precordial pain: Secondary | ICD-10-CM | POA: Diagnosis not present

## 2018-10-19 DIAGNOSIS — E785 Hyperlipidemia, unspecified: Secondary | ICD-10-CM | POA: Diagnosis not present

## 2018-10-19 DIAGNOSIS — I10 Essential (primary) hypertension: Secondary | ICD-10-CM | POA: Diagnosis not present

## 2018-10-19 DIAGNOSIS — I2510 Atherosclerotic heart disease of native coronary artery without angina pectoris: Secondary | ICD-10-CM | POA: Diagnosis not present

## 2018-10-20 ENCOUNTER — Ambulatory Visit: Payer: Medicare Other | Admitting: Rheumatology

## 2018-10-24 ENCOUNTER — Other Ambulatory Visit: Payer: Self-pay | Admitting: Internal Medicine

## 2018-11-10 ENCOUNTER — Other Ambulatory Visit: Payer: Self-pay | Admitting: Internal Medicine

## 2018-11-11 DIAGNOSIS — Z8546 Personal history of malignant neoplasm of prostate: Secondary | ICD-10-CM | POA: Diagnosis not present

## 2018-11-18 ENCOUNTER — Other Ambulatory Visit: Payer: Self-pay

## 2018-11-18 ENCOUNTER — Encounter: Payer: Self-pay | Admitting: Internal Medicine

## 2018-11-18 ENCOUNTER — Ambulatory Visit (INDEPENDENT_AMBULATORY_CARE_PROVIDER_SITE_OTHER): Payer: Medicare Other | Admitting: Internal Medicine

## 2018-11-18 ENCOUNTER — Ambulatory Visit (INDEPENDENT_AMBULATORY_CARE_PROVIDER_SITE_OTHER): Payer: Medicare Other

## 2018-11-18 VITALS — BP 132/76 | HR 68 | Temp 97.5°F | Ht 69.6 in | Wt 237.6 lb

## 2018-11-18 DIAGNOSIS — I2510 Atherosclerotic heart disease of native coronary artery without angina pectoris: Secondary | ICD-10-CM

## 2018-11-18 DIAGNOSIS — E1122 Type 2 diabetes mellitus with diabetic chronic kidney disease: Secondary | ICD-10-CM | POA: Diagnosis not present

## 2018-11-18 DIAGNOSIS — N182 Chronic kidney disease, stage 2 (mild): Secondary | ICD-10-CM

## 2018-11-18 DIAGNOSIS — R3915 Urgency of urination: Secondary | ICD-10-CM | POA: Diagnosis not present

## 2018-11-18 DIAGNOSIS — G2581 Restless legs syndrome: Secondary | ICD-10-CM

## 2018-11-18 DIAGNOSIS — E6609 Other obesity due to excess calories: Secondary | ICD-10-CM | POA: Diagnosis not present

## 2018-11-18 DIAGNOSIS — Z0000 Encounter for general adult medical examination without abnormal findings: Secondary | ICD-10-CM | POA: Diagnosis not present

## 2018-11-18 DIAGNOSIS — I1310 Hypertensive heart and chronic kidney disease without heart failure, with stage 1 through stage 4 chronic kidney disease, or unspecified chronic kidney disease: Secondary | ICD-10-CM

## 2018-11-18 DIAGNOSIS — Z6834 Body mass index (BMI) 34.0-34.9, adult: Secondary | ICD-10-CM | POA: Diagnosis not present

## 2018-11-18 DIAGNOSIS — Z23 Encounter for immunization: Secondary | ICD-10-CM | POA: Diagnosis not present

## 2018-11-18 DIAGNOSIS — Z8546 Personal history of malignant neoplasm of prostate: Secondary | ICD-10-CM | POA: Diagnosis not present

## 2018-11-18 MED ORDER — PREVNAR 13 IM SUSP: 0.5000 mL | INTRAMUSCULAR | 0 refills | Status: AC

## 2018-11-18 NOTE — Patient Instructions (Signed)

## 2018-11-18 NOTE — Progress Notes (Signed)
Subjective:     Patient ID: Franklin Thornton , male    DOB: 1945/05/10 , 73 y.o.   MRN: [id number]   Chief Complaint  Patient presents with  . Diabetes  . Hypertension    HPI  Diabetes He presents for his follow-up diabetic visit. He has type 2 diabetes mellitus. His disease course has been improving. There are no hypoglycemic associated symptoms. Pertinent negatives for diabetes include no blurred vision and no chest pain. There are no hypoglycemic complications. Risk factors for coronary artery disease include diabetes mellitus, dyslipidemia, hypertension, male sex, obesity and sedentary lifestyle. His breakfast blood glucose is taken between 9-10 am. His breakfast blood glucose range is generally 90-110 mg/dl. An ACE inhibitor/angiotensin II receptor blocker is being taken. Eye exam is current.  Hypertension This is a chronic problem. The current episode started more than 1 year ago. Pertinent negatives include no blurred vision, chest pain, palpitations or shortness of breath.     Past Medical History:  Diagnosis Date  . Atrial fibrillation ([place])   . Diabetes mellitus without complication ([place])   . Peripheral vascular disease ([place])   . Prostate cancer [facility])      Family History  Problem Relation Age of Onset  . Lung cancer Mother   . Diabetes Mother   . Esophageal cancer Father   . Hypertension Sister   . Breast cancer Sister   . Healthy Son   . Healthy Daughter      Current Outpatient Medications:  .  albuterol (PROVENTIL HFA;VENTOLIN HFA) 108 (90 Base) MCG/ACT inhaler, Inhale 2 puffs into the lungs every 6 (six) hours as needed for wheezing or shortness of breath., Disp: , Rfl:  .  amiodarone (PACERONE) 200 MG tablet, Take 200 mg by mouth daily., Disp: , Rfl:  .  aspirin 81 MG tablet, Take 81 mg by mouth daily., Disp: , Rfl:  .  atorvastatin (LIPITOR) 20 MG tablet, TAKE 1 TABLET DAILY, Disp: 90 tablet, Rfl: 2 .  b complex vitamins capsule, Take 1 capsule by mouth  daily., Disp: , Rfl:  .  beclomethasone (QVAR) 80 MCG/ACT inhaler, Inhale 2 puffs into the lungs 2 (two) times daily., Disp: , Rfl:  .  carvedilol (COREG) 6.25 MG tablet, Take 6.25 mg by mouth 2 (two) times daily with a meal., Disp: , Rfl:  .  Cholecalciferol (VITAMIN D) 2000 units tablet, Take 2,000 Units by mouth daily., Disp: , Rfl:  .  DULoxetine (CYMBALTA) 30 MG capsule, TAKE 1 CAPSULE DAILY IN THE EVENING, Disp: 90 capsule, Rfl: 3 .  empagliflozin (JARDIANCE) 10 MG TABS tablet, Take 1 tablet by mouth daily, Disp: 90 tablet, Rfl: 1 .  furosemide (LASIX) 20 MG tablet, Take 20 mg by mouth daily., Disp: , Rfl:  .  HYDROcodone-acetaminophen (NORCO/VICODIN) 5-325 MG tablet, Take 1 tablet by mouth every 6 (six) hours as needed for moderate pain., Disp: , Rfl:  .  ipratropium (ATROVENT) 0.03 % nasal spray, Place 2 sprays into both nostrils every 12 (twelve) hours., Disp: , Rfl:  .  levocetirizine (XYZAL) 5 MG tablet, Take 5 mg by mouth every evening., Disp: , Rfl:  .  levothyroxine (SYNTHROID) 50 MCG tablet, TAKE 1 TABLET DAILY, Disp: 90 tablet, Rfl: 2 .  lidocaine (LIDODERM) 5 %, Place 3 patches onto the skin daily. May use 1-3 patches at one time, Remove & Discard patch within 12 hours or as directed by MD, Disp: 90 patch, Rfl: 0 .  lisinopril (PRINIVIL,ZESTRIL) 20 MG tablet, ,  Disp: , Rfl:  .  Magnesium 250 MG TABS, Take 1 tablet by mouth daily., Disp: , Rfl:  .  mirabegron ER (MYRBETRIQ) 50 MG TB24 tablet, Take 50 mg by mouth daily., Disp: , Rfl:  .  Multiple Vitamin (MULTIVITAMIN) tablet, Take 1 tablet by mouth daily., Disp: , Rfl:  .  nitroGLYCERIN (NITROSTAT) 0.4 MG SL tablet, Place 0.4 mg under the tongue every 5 (five) minutes as needed for chest pain., Disp: , Rfl:  .  pantoprazole (PROTONIX) 40 MG tablet, TAKE 1 TABLET DAILY, Disp: 90 tablet, Rfl: 3 .  pneumococcal 13-valent conjugate vaccine (PREVNAR 13) SUSP injection, Inject 0.5 mLs into the muscle tomorrow at 10 am for 1 dose., Disp:  0.5 mL, Rfl: 0 .  pramipexole (MIRAPEX) 0.5 MG tablet, Take 1 mg by mouth 3 (three) times daily. , Disp: , Rfl:  .  rivaroxaban (XARELTO) 10 MG TABS tablet, Take 10 mg by mouth daily., Disp: , Rfl:  .  vitamin B-12 (CYANOCOBALAMIN) 1000 MCG tablet, Take 1,000 mcg by mouth daily., Disp: , Rfl:    Allergies  Allergen Reactions  . No Known Allergies      Review of Systems  Constitutional: Negative.   Eyes: Negative for blurred vision.  Respiratory: Negative.  Negative for shortness of breath.   Cardiovascular: Negative.  Negative for chest pain and palpitations.  Gastrointestinal: Negative.   Neurological: Negative.        He c/o worsening sx of RLS. Mirapex is no longer effective.   Psychiatric/Behavioral: Negative.      Today's Vitals   11/18/18 0942  BP: 132/76  Pulse: 68  Temp: (!) 97.5 F (36.4 C)  TempSrc: Oral  Weight: 237 lb 9.6 oz (107.8 kg)  Height: 5' 9.6" (1.768 m)   Body mass index is 34.49 kg/m.   Objective:  Physical Exam Vitals signs and nursing note reviewed.  Constitutional:      Appearance: Normal appearance.  Cardiovascular:     Rate and Rhythm: Normal rate and regular rhythm.     Heart sounds: Normal heart sounds.  Pulmonary:     Effort: Pulmonary effort is normal.     Breath sounds: Normal breath sounds.  Skin:    General: Skin is warm.  Neurological:     General: No focal deficit present.     Mental Status: He is alert.  Psychiatric:        Mood and Affect: Mood normal.         Assessment And Plan:     1. Type 2 diabetes mellitus with stage 2 chronic kidney disease, without long-term current use of insulin (HCC)  Chronic. I will check labs as listed below. He is encouraged to incorporate more exercise into his daily routine.   - Hemoglobin A1c - CMP14+EGFR  2. Hypertensive heart and renal disease with renal failure, stage 1 through stage 4 or unspecified chronic kidney disease, without heart failure  Controlled. He will continue  with current meds. He is encouraged to avoid adding salt to his foods.   3. Restless leg syndrome  Unfortunately, he is not getting any relief with mirapex. I will increase duloxetine to 35m daily.  He will rto in six weeks for re-evaluation. I will also refer him to Neuro for further evaluation.   - Ambulatory referral to Neurology  4. Class 1 obesity due to excess calories with serious comorbidity and body mass index (BMI) of 34.0 to 34.9 in adult  Importance of achieving optimal weight to  decrease risk of cardiovascular disease and cancers was discussed with the patient in full detail. He is encouraged to start slowly - start with 10 minutes twice daily at least three to four days per week and to gradually build to 30 minutes five days weekly. He was given tips to incorporate more activity into his  daily routine - take stairs when possible, park farther away from grocery stores, etc.    [name], MD    THE PATIENT IS ENCOURAGED TO PRACTICE SOCIAL DISTANCING DUE TO THE COVID-19 PANDEMIC.

## 2018-11-18 NOTE — Patient Instructions (Signed)
Franklin Thornton , Thank you for taking time to come for your Medicare Wellness Visit. I appreciate your ongoing commitment to your health goals. Please review the following plan we discussed and let me know if I can assist you in the future.   Screening recommendations/referrals: Colonoscopy: 05/2016 Recommended yearly ophthalmology/optometry visit for glaucoma screening and checkup Recommended yearly dental visit for hygiene and checkup  Vaccinations: Influenza vaccine: 2019 Pneumococcal vaccine: sent to pharmacy Tdap vaccine: 10/2015 Shingles vaccine: discussed    Advanced directives: Please bring a copy of your POA (Power of [place]) and/or Living Will to your next appointment.    Conditions/risks identified: obesity  Next appointment: 11/18/2018  Preventive Care 74 Years and Older, Male Preventive care refers to lifestyle choices and visits with your health care provider that can promote health and wellness. What does preventive care include?  A yearly physical exam. This is also called an annual well check.  Dental exams once or twice a year.  Routine eye exams. Ask your health care provider how often you should have your eyes checked.  Personal lifestyle choices, including:  Daily care of your teeth and gums.  Regular physical activity.  Eating a healthy diet.  Avoiding tobacco and drug use.  Limiting alcohol use.  Practicing safe sex.  Taking low doses of aspirin every day.  Taking vitamin and mineral supplements as recommended by your health care provider. What happens during an annual well check? The services and screenings done by your health care provider during your annual well check will depend on your age, overall health, lifestyle risk factors, and family history of disease. Counseling  Your health care provider may ask you questions about your:  Alcohol use.  Tobacco use.  Drug use.  Emotional well-being.  Home and relationship well-being.   Sexual activity.  Eating habits.  History of falls.  Memory and ability to understand (cognition).  Work and work Statistician. Screening  You may have the following tests or measurements:  Height, weight, and BMI.  Blood pressure.  Lipid and cholesterol levels. These may be checked every 5 years, or more frequently if you are over 18 years old.  Skin check.  Lung cancer screening. You may have this screening every year starting at age 34 if you have a 30-pack-year history of smoking and currently smoke or have quit within the past 15 years.  Fecal occult blood test (FOBT) of the stool. You may have this test every year starting at age 58.  Flexible sigmoidoscopy or colonoscopy. You may have a sigmoidoscopy every 5 years or a colonoscopy every 10 years starting at age 53.  Prostate cancer screening. Recommendations will vary depending on your family history and other risks.  Hepatitis C blood test.  Hepatitis B blood test.  Sexually transmitted disease (STD) testing.  Diabetes screening. This is done by checking your blood sugar (glucose) after you have not eaten for a while (fasting). You may have this done every 1-3 years.  Abdominal aortic aneurysm (AAA) screening. You may need this if you are a current or former smoker.  Osteoporosis. You may be screened starting at age 47 if you are at high risk. Talk with your health care provider about your test results, treatment options, and if necessary, the need for more tests. Vaccines  Your health care provider may recommend certain vaccines, such as:  Influenza vaccine. This is recommended every year.  Tetanus, diphtheria, and acellular pertussis (Tdap, Td) vaccine. You may need a Td booster every  10 years.  Zoster vaccine. You may need this after age 5.  Pneumococcal 13-valent conjugate (PCV13) vaccine. One dose is recommended after age 12.  Pneumococcal polysaccharide (PPSV23) vaccine. One dose is recommended after  age 31. Talk to your health care provider about which screenings and vaccines you need and how often you need them. This information is not intended to replace advice given to you by your health care provider. Make sure you discuss any questions you have with your health care provider. Document Released: 05/05/2015 Document Revised: 12/27/2015 Document Reviewed: 02/07/2015 Elsevier Interactive Patient Education  2017 [employer] Prevention in the Home Falls can cause injuries. They can happen to people of all ages. There are many things you can do to make your home safe and to help prevent falls. What can I do on the outside of my home?  Regularly fix the edges of walkways and driveways and fix any cracks.  Remove anything that might make you trip as you walk through a door, such as a raised step or threshold.  Trim any bushes or trees on the path to your home.  Use bright outdoor lighting.  Clear any walking paths of anything that might make someone trip, such as rocks or tools.  Regularly check to see if handrails are loose or broken. Make sure that both sides of any steps have handrails.  Any raised decks and porches should have guardrails on the edges.  Have any leaves, snow, or ice cleared regularly.  Use sand or salt on walking paths during winter.  Clean up any spills in your garage right away. This includes oil or grease spills. What can I do in the bathroom?  Use night lights.  Install grab bars by the toilet and in the tub and shower. Do not use towel bars as grab bars.  Use non-skid mats or decals in the tub or shower.  If you need to sit down in the shower, use a plastic, non-slip stool.  Keep the floor dry. Clean up any water that spills on the floor as soon as it happens.  Remove soap buildup in the tub or shower regularly.  Attach bath mats securely with double-sided non-slip rug tape.  Do not have throw rugs and other things on the floor that can  make you trip. What can I do in the bedroom?  Use night lights.  Make sure that you have a light by your bed that is easy to reach.  Do not use any sheets or blankets that are too big for your bed. They should not hang down onto the floor.  Have a firm chair that has side arms. You can use this for support while you get dressed.  Do not have throw rugs and other things on the floor that can make you trip. What can I do in the kitchen?  Clean up any spills right away.  Avoid walking on wet floors.  Keep items that you use a lot in easy-to-reach places.  If you need to reach something above you, use a strong step stool that has a grab bar.  Keep electrical cords out of the way.  Do not use floor polish or wax that makes floors slippery. If you must use wax, use non-skid floor wax.  Do not have throw rugs and other things on the floor that can make you trip. What can I do with my stairs?  Do not leave any items on the stairs.  Make sure  that there are handrails on both sides of the stairs and use them. Fix handrails that are broken or loose. Make sure that handrails are as long as the stairways.  Check any carpeting to make sure that it is firmly attached to the stairs. Fix any carpet that is loose or worn.  Avoid having throw rugs at the top or bottom of the stairs. If you do have throw rugs, attach them to the floor with carpet tape.  Make sure that you have a light switch at the top of the stairs and the bottom of the stairs. If you do not have them, ask someone to add them for you. What else can I do to help prevent falls?  Wear shoes that:  Do not have high heels.  Have rubber bottoms.  Are comfortable and fit you well.  Are closed at the toe. Do not wear sandals.  If you use a stepladder:  Make sure that it is fully opened. Do not climb a closed stepladder.  Make sure that both sides of the stepladder are locked into place.  Ask someone to hold it for you,  if possible.  Clearly mark and make sure that you can see:  Any grab bars or handrails.  First and last steps.  Where the edge of each step is.  Use tools that help you move around (mobility aids) if they are needed. These include:  Canes.  Walkers.  Scooters.  Crutches.  Turn on the lights when you go into a dark area. Replace any light bulbs as soon as they burn out.  Set up your furniture so you have a clear path. Avoid moving your furniture around.  If any of your floors are uneven, fix them.  If there are any pets around you, be aware of where they are.  Review your medicines with your doctor. Some medicines can make you feel dizzy. This can increase your chance of falling. Ask your doctor what other things that you can do to help prevent falls. This information is not intended to replace advice given to you by your health care provider. Make sure you discuss any questions you have with your health care provider. Document Released: 02/02/2009 Document Revised: 09/14/2015 Document Reviewed: 05/13/2014 Elsevier Interactive Patient Education  2017 [employer].

## 2018-11-18 NOTE — Progress Notes (Signed)
Subjective:   Franklin Thornton is a 73 y.o. male who presents for Medicare Annual/Subsequent preventive examination.  Review of Systems:  n/a Cardiac Risk Factors include: advanced age (>10men, >77 women);diabetes mellitus;dyslipidemia;hypertension;male gender;obesity (BMI >30kg/m2);sedentary lifestyle     Objective:    Vitals: BP 132/76 (BP Location: Left Arm, Patient Position: Sitting, Cuff Size: Normal)   Pulse 68   Temp (!) 97.5 F (36.4 C) (Oral)   Ht 5' 9.6" (1.768 m)   Wt 237 lb 9.6 oz (107.8 kg)   SpO2 94%   BMI 34.49 kg/m   Body mass index is 34.49 kg/m.  Advanced Directives 11/18/2018 08/28/2017 07/28/2017 08/14/2015  Does Patient Have a Medical Advance Directive? Yes No No No  Type of Advance Directive [place] in Chart? No - copy requested - - -  Would patient like information on creating a medical advance directive? - - Yes (MAU/Ambulatory/Procedural Areas - Information given) No - patient declined information    Tobacco Social History   Tobacco Use  Smoking Status Former Smoker  . Packs/day: 2.00  . Years: 4.00  . Pack years: 8.00  . Types: Cigarettes  . Quit date: 08/13/1965  . Years since quitting: 53.3  Smokeless Tobacco Never Used     Counseling given: Not Answered   Clinical Intake:  Pre-visit preparation completed: Yes  Pain : No/denies pain(tickling sensation down both legs)     Nutritional Status: BMI > 30  Obese Nutritional Risks: None Diabetes: Yes CBG done?: No Did pt. bring in CBG monitor from home?: No  How often do you need to have someone help you when you read instructions, pamphlets, or other written materials from your doctor or pharmacy?: 1 - Never What is the last grade level you completed in school?: college  Interpreter Needed?: No  Information entered by :: NAllen LPN  Past Medical History:  Diagnosis Date  . Atrial fibrillation ([place])   . Diabetes  mellitus without complication ([place])   . Peripheral vascular disease ([place])   . Prostate cancer [facility])    Past Surgical History:  Procedure Laterality Date  . CORONARY ANGIOPLASTY WITH STENT PLACEMENT     x2  . ENDOVENOUS ABLATION SAPHENOUS VEIN W/ LASER Left 08/19/2017   endovenous laser ablation left greater saphenous vein by Tinnie Gens MD   . EXTERNAL EAR SURGERY  2020   right ear   . UVULECTOMY  07/31/15   Family History  Problem Relation Age of Onset  . Lung cancer Mother   . Diabetes Mother   . Esophageal cancer Father   . Hypertension Sister   . Breast cancer Sister   . Healthy Son   . Healthy Daughter    Social History   Socioeconomic History  . Marital status: Married    Spouse name: Not on file  . Number of children: Not on file  . Years of education: Not on file  . Highest education level: Not on file  Occupational History  . Occupation: retired  Scientific laboratory technician  . Financial resource strain: Not hard at all  . Food insecurity    Worry: Never true    Inability: Never true  . Transportation needs    Medical: No    Non-medical: No  Tobacco Use  . Smoking status: Former Smoker    Packs/day: 2.00    Years: 4.00    Pack years: 8.00    Types: Cigarettes  Quit date: 08/13/1965    Years since quitting: 53.3  . Smokeless tobacco: Never Used  Substance and Sexual Activity  . Alcohol use: Yes    Alcohol/week: 1.0 standard drinks    Types: 1 Standard drinks or equivalent per week    Comment: once a month  . Drug use: Yes    Types: Hydrocodone  . Sexual activity: Not Currently  Lifestyle  . Physical activity    Days per week: 0 days    Minutes per session: 0 min  . Stress: Rather much  Relationships  . Social Herbalist on phone: Not on file    Gets together: Not on file    Attends religious service: Not on file    Active member of club or organization: Not on file    Attends meetings of clubs or organizations: Not on file    Relationship  status: Not on file  Other Topics Concern  . Not on file  Social History Narrative  . Not on file    Outpatient Encounter Medications as of 11/18/2018  Medication Sig  . albuterol (PROVENTIL HFA;VENTOLIN HFA) 108 (90 Base) MCG/ACT inhaler Inhale 2 puffs into the lungs every 6 (six) hours as needed for wheezing or shortness of breath.  [name] amiodarone (PACERONE) 200 MG tablet Take 200 mg by mouth daily.  [name] aspirin 81 MG tablet Take 81 mg by mouth daily.  [name] atorvastatin (LIPITOR) 20 MG tablet TAKE 1 TABLET DAILY  . b complex vitamins capsule Take 1 capsule by mouth daily.  . beclomethasone (QVAR) 80 MCG/ACT inhaler Inhale 2 puffs into the lungs 2 (two) times daily.  . carvedilol (COREG) 6.25 MG tablet Take 6.25 mg by mouth 2 (two) times daily with a meal.  . Cholecalciferol (VITAMIN D) 2000 units tablet Take 2,000 Units by mouth daily.  . DULoxetine (CYMBALTA) 30 MG capsule TAKE 1 CAPSULE DAILY IN THE EVENING  . empagliflozin (JARDIANCE) 10 MG TABS tablet Take 1 tablet by mouth daily  . furosemide (LASIX) 20 MG tablet Take 20 mg by mouth daily.  [name] HYDROcodone-acetaminophen (NORCO/VICODIN) 5-325 MG tablet Take 1 tablet by mouth every 6 (six) hours as needed for moderate pain.  [name] ipratropium (ATROVENT) 0.03 % nasal spray Place 2 sprays into both nostrils every 12 (twelve) hours.  [name] levocetirizine (XYZAL) 5 MG tablet Take 5 mg by mouth every evening.  [name] levothyroxine (SYNTHROID) 50 MCG tablet TAKE 1 TABLET DAILY  . lidocaine (LIDODERM) 5 % Place 3 patches onto the skin daily. May use 1-3 patches at one time, Remove & Discard patch within 12 hours or as directed by MD  . Magnesium 250 MG TABS Take 1 tablet by mouth daily.  . mirabegron ER (MYRBETRIQ) 50 MG TB24 tablet Take 50 mg by mouth daily.  . Multiple Vitamin (MULTIVITAMIN) tablet Take 1 tablet by mouth daily.  . nitroGLYCERIN (NITROSTAT) 0.4 MG SL tablet Place 0.4 mg under the tongue every 5 (five) minutes as needed for chest pain.  .  pantoprazole (PROTONIX) 40 MG tablet TAKE 1 TABLET DAILY  . pramipexole (MIRAPEX) 0.5 MG tablet Take 1 mg by mouth 3 (three) times daily.   . rivaroxaban (XARELTO) 10 MG TABS tablet Take 10 mg by mouth daily.  . vitamin B-12 (CYANOCOBALAMIN) 1000 MCG tablet Take 1,000 mcg by mouth daily.  [name] lisinopril (PRINIVIL,ZESTRIL) 20 MG tablet    No facility-administered encounter medications on file as of 11/18/2018.     Activities of Daily Living In  your present state of health, do you have any difficulty performing the following activities: 11/18/2018  Hearing? Y  Comment has to have tv up a little loud  Vision? Y  Comment needs reading glasses  Difficulty concentrating or making decisions? Y  Comment forgetfulness  Walking or climbing stairs? N  Dressing or bathing? N  Doing errands, shopping? N  Preparing Food and eating ? N  Using the Toilet? N  In the past six months, have you accidently leaked urine? Y  Comment takes myrbetriq  Do you have problems with loss of bowel control? N  Managing your Medications? N  Managing your Finances? N  Housekeeping or managing your Housekeeping? N  Some recent data might be hidden    Patient Care Team: [name], MD as PCP - General (Internal Medicine) Gardiner Rhyme, MD as Referring Physician (Specialist) Clifton Custard, MD as Referring Physician (Internal Medicine) Warden Fillers, MD as Consulting Physician (Ophthalmology) Adrian Prows, MD as Consulting Physician (Cardiology)   Assessment:   This is a routine wellness examination for [place].  Exercise Activities and Dietary recommendations Current Exercise Habits: The patient does not participate in regular exercise at present  Goals    . Weight (lb) < 200 lb (90.7 kg) (pt-stated)       Fall Risk Fall Risk  11/18/2018 06/17/2018 03/17/2018 03/10/2018  Falls in the past year? 0 0 0 0  Comment - - - Emmi Telephone Survey: data to providers prior to load  Risk for fall due to :  Medication side effect - - -  Follow up Falls evaluation completed;Education provided;Falls prevention discussed - - -   Is the patient's home free of loose throw rugs in walkways, pet beds, electrical cords, etc?   yes      Grab bars in the bathroom? no      Handrails on the stairs?   n/a      Adequate lighting?   yes  Timed Get Up and Go Performed: n/a  Depression Screen PHQ 2/9 Scores 11/18/2018 06/17/2018 03/17/2018  PHQ - 2 Score 0 0 0  PHQ- 9 Score 7 - -    Cognitive Function     6CIT Screen 11/18/2018  What Year? 0 points  What month? 0 points  What time? 0 points  Count back from 20 0 points  Months in reverse 0 points  Repeat phrase 0 points  Total Score 0    Immunization History  Administered Date(s) Administered  . Tdap 11/03/2015    Qualifies for Shingles Vaccine? yes  Screening Tests Health Maintenance  Topic Date Due  . PNA vac Low Risk Adult (1 of 2 - PCV13) 12/19/2009  . INFLUENZA VACCINE  11/21/2018  . HEMOGLOBIN A1C  12/16/2018  . OPHTHALMOLOGY EXAM  04/10/2019  . FOOT EXAM  06/18/2019  . TETANUS/TDAP  11/02/2025  . COLONOSCOPY  06/18/2026  . Hepatitis C Screening  Completed   Cancer Screenings: Lung: Low Dose CT Chest recommended if Age 47-80 years, 30 pack-year currently smoking OR have quit w/in 15years. Patient does not qualify. Colorectal: up to date  Additional Screenings:  Hepatitis C Screening:07/22/2012      Plan:    6 CIT was 0. Patient wants to get under 200 pounds.  I have personally reviewed and noted the following in the patient's chart:   . Medical and social history . Use of alcohol, tobacco or illicit drugs  . Current medications and supplements . Functional ability and status . Nutritional status .  Physical activity . Advanced directives . List of other physicians . Hospitalizations, surgeries, and ER visits in previous 12 months . Vitals . Screenings to include cognitive, depression, and falls . Referrals and  appointments  In addition, I have reviewed and discussed with patient certain preventive protocols, quality metrics, and best practice recommendations. A written personalized care plan for preventive services as well as general preventive health recommendations were provided to patient.     Kellie Simmering, LPN  05/22/4386

## 2018-11-19 LAB — HEMOGLOBIN A1C
Est. average glucose Bld gHb Est-mCnc: 128 mg/dL
Hgb A1c MFr Bld: 6.1 % — ABNORMAL HIGH (ref 4.8–5.6)

## 2018-11-19 LAB — CMP14+EGFR
ALT: 21 IU/L (ref 0–44)
AST: 20 IU/L (ref 0–40)
Albumin/Globulin Ratio: 1.5 (ref 1.2–2.2)
Albumin: 4.1 g/dL (ref 3.7–4.7)
Alkaline Phosphatase: 70 IU/L (ref 39–117)
BUN/Creatinine Ratio: 22 (ref 10–24)
BUN: 23 mg/dL (ref 8–27)
Bilirubin Total: 0.3 mg/dL (ref 0.0–1.2)
CO2: 24 mmol/L (ref 20–29)
Calcium: 9.4 mg/dL (ref 8.6–10.2)
Chloride: 100 mmol/L (ref 96–106)
Creatinine, Ser: 1.05 mg/dL (ref 0.76–1.27)
GFR calc Af Amer: 81 mL/min/{1.73_m2} (ref >59)
GFR calc non Af Amer: 70 mL/min/{1.73_m2} (ref >59)
Globulin, Total: 2.7 g/dL (ref 1.5–4.5)
Glucose: 112 mg/dL — ABNORMAL HIGH (ref 65–99)
Potassium: 5 mmol/L (ref 3.5–5.2)
Sodium: 142 mmol/L (ref 134–144)
Total Protein: 6.8 g/dL (ref 6.0–8.5)

## 2018-11-20 ENCOUNTER — Telehealth: Payer: Self-pay

## 2018-11-20 NOTE — Telephone Encounter (Signed)
-----   Message from [name], MD sent at 11/19/2018 11:55 AM EDT ----- Your hba1c is 6.1, pretty good. Liver and kidney fxn are stable

## 2018-11-20 NOTE — Telephone Encounter (Signed)
Left the patient a message to call back for lab results. 

## 2018-12-11 DIAGNOSIS — E1140 Type 2 diabetes mellitus with diabetic neuropathy, unspecified: Secondary | ICD-10-CM | POA: Diagnosis not present

## 2018-12-11 DIAGNOSIS — M4316 Spondylolisthesis, lumbar region: Secondary | ICD-10-CM | POA: Diagnosis not present

## 2018-12-11 DIAGNOSIS — G479 Sleep disorder, unspecified: Secondary | ICD-10-CM | POA: Diagnosis not present

## 2018-12-11 DIAGNOSIS — G3184 Mild cognitive impairment, so stated: Secondary | ICD-10-CM | POA: Diagnosis not present

## 2018-12-11 DIAGNOSIS — E1142 Type 2 diabetes mellitus with diabetic polyneuropathy: Secondary | ICD-10-CM | POA: Diagnosis not present

## 2018-12-11 DIAGNOSIS — G2581 Restless legs syndrome: Secondary | ICD-10-CM | POA: Diagnosis not present

## 2018-12-11 DIAGNOSIS — G894 Chronic pain syndrome: Secondary | ICD-10-CM | POA: Diagnosis not present

## 2018-12-11 DIAGNOSIS — I10 Essential (primary) hypertension: Secondary | ICD-10-CM | POA: Diagnosis not present

## 2018-12-11 DIAGNOSIS — R5383 Other fatigue: Secondary | ICD-10-CM | POA: Diagnosis not present

## 2018-12-30 ENCOUNTER — Encounter: Payer: Self-pay | Admitting: Internal Medicine

## 2018-12-30 ENCOUNTER — Ambulatory Visit (INDEPENDENT_AMBULATORY_CARE_PROVIDER_SITE_OTHER): Payer: Medicare Other | Admitting: Internal Medicine

## 2018-12-30 ENCOUNTER — Other Ambulatory Visit: Payer: Self-pay

## 2018-12-30 VITALS — BP 142/80 | HR 72 | Temp 98.8°F | Ht 66.4 in | Wt 245.6 lb

## 2018-12-30 DIAGNOSIS — Z23 Encounter for immunization: Secondary | ICD-10-CM

## 2018-12-30 DIAGNOSIS — Z8639 Personal history of other endocrine, nutritional and metabolic disease: Secondary | ICD-10-CM

## 2018-12-30 DIAGNOSIS — G2581 Restless legs syndrome: Secondary | ICD-10-CM | POA: Diagnosis not present

## 2018-12-30 DIAGNOSIS — I1310 Hypertensive heart and chronic kidney disease without heart failure, with stage 1 through stage 4 chronic kidney disease, or unspecified chronic kidney disease: Secondary | ICD-10-CM

## 2018-12-30 DIAGNOSIS — I739 Peripheral vascular disease, unspecified: Secondary | ICD-10-CM | POA: Insufficient documentation

## 2018-12-30 DIAGNOSIS — L03115 Cellulitis of right lower limb: Secondary | ICD-10-CM | POA: Diagnosis not present

## 2018-12-30 DIAGNOSIS — I2510 Atherosclerotic heart disease of native coronary artery without angina pectoris: Secondary | ICD-10-CM

## 2018-12-30 MED ORDER — CEPHALEXIN 500 MG PO CAPS: 500.0000 mg | ORAL_CAPSULE | Freq: Three times a day (TID) | ORAL | 0 refills | Status: AC

## 2018-12-30 NOTE — Patient Instructions (Signed)

## 2018-12-31 LAB — TSH: TSH: 1.22 u[IU]/mL (ref 0.450–4.500)

## 2019-01-03 NOTE — Progress Notes (Signed)
Subjective:     Patient ID: Franklin Thornton , male    DOB: 1945-10-25 , 73 y.o.   MRN: [id number]   Chief Complaint  Patient presents with  . Leg Pain    rls    HPI  He is here today for f/u RLS.  He has also been seen by Neurology. He reports compliance with meds. He has had some relief of his sx.     Past Medical History:  Diagnosis Date  . Atrial fibrillation ([place])   . Diabetes mellitus without complication ([place])   . Peripheral vascular disease ([place])   . Prostate cancer [facility])      Family History  Problem Relation Age of Onset  . Lung cancer Mother   . Diabetes Mother   . Esophageal cancer Father   . Hypertension Sister   . Breast cancer Sister   . Healthy Son   . Healthy Daughter      Current Outpatient Medications:  .  albuterol (PROVENTIL HFA;VENTOLIN HFA) 108 (90 Base) MCG/ACT inhaler, Inhale 2 puffs into the lungs every 6 (six) hours as needed for wheezing or shortness of breath., Disp: , Rfl:  .  amiodarone (PACERONE) 200 MG tablet, Take 200 mg by mouth daily., Disp: , Rfl:  .  aspirin 81 MG tablet, Take 81 mg by mouth daily., Disp: , Rfl:  .  atorvastatin (LIPITOR) 20 MG tablet, TAKE 1 TABLET DAILY, Disp: 90 tablet, Rfl: 2 .  b complex vitamins capsule, Take 1 capsule by mouth daily., Disp: , Rfl:  .  beclomethasone (QVAR) 80 MCG/ACT inhaler, Inhale 2 puffs into the lungs 2 (two) times daily., Disp: , Rfl:  .  carvedilol (COREG) 6.25 MG tablet, Take 6.25 mg by mouth 2 (two) times daily with a meal., Disp: , Rfl:  .  Cholecalciferol (VITAMIN D) 2000 units tablet, Take 2,000 Units by mouth daily., Disp: , Rfl:  .  DULoxetine (CYMBALTA) 30 MG capsule, TAKE 1 CAPSULE DAILY IN THE EVENING, Disp: 90 capsule, Rfl: 3 .  empagliflozin (JARDIANCE) 10 MG TABS tablet, Take 1 tablet by mouth daily, Disp: 90 tablet, Rfl: 1 .  furosemide (LASIX) 20 MG tablet, Take 20 mg by mouth daily., Disp: , Rfl:  .  HYDROcodone-acetaminophen (NORCO/VICODIN) 5-325 MG tablet, Take 1 tablet  by mouth every 6 (six) hours as needed for moderate pain., Disp: , Rfl:  .  ipratropium (ATROVENT) 0.03 % nasal spray, Place 2 sprays into both nostrils every 12 (twelve) hours., Disp: , Rfl:  .  levocetirizine (XYZAL) 5 MG tablet, Take 5 mg by mouth every evening., Disp: , Rfl:  .  levothyroxine (SYNTHROID) 50 MCG tablet, TAKE 1 TABLET DAILY, Disp: 90 tablet, Rfl: 2 .  lidocaine (LIDODERM) 5 %, Place 3 patches onto the skin daily. May use 1-3 patches at one time, Remove & Discard patch within 12 hours or as directed by MD, Disp: 90 patch, Rfl: 0 .  Magnesium 250 MG TABS, Take 1 tablet by mouth daily., Disp: , Rfl:  .  mirabegron ER (MYRBETRIQ) 50 MG TB24 tablet, Take 50 mg by mouth daily., Disp: , Rfl:  .  Multiple Vitamin (MULTIVITAMIN) tablet, Take 1 tablet by mouth daily., Disp: , Rfl:  .  nitroGLYCERIN (NITROSTAT) 0.4 MG SL tablet, Place 0.4 mg under the tongue every 5 (five) minutes as needed for chest pain., Disp: , Rfl:  .  pantoprazole (PROTONIX) 40 MG tablet, TAKE 1 TABLET DAILY, Disp: 90 tablet, Rfl: 3 .  pramipexole (MIRAPEX)  0.5 MG tablet, Take 1 mg by mouth 3 (three) times daily. , Disp: , Rfl:  .  rivaroxaban (XARELTO) 10 MG TABS tablet, Take 10 mg by mouth daily., Disp: , Rfl:  .  vitamin B-12 (CYANOCOBALAMIN) 1000 MCG tablet, Take 1,000 mcg by mouth daily., Disp: , Rfl:  .  cephALEXin (KEFLEX) 500 MG capsule, Take 1 capsule (500 mg total) by mouth 3 (three) times daily for 10 days., Disp: 30 capsule, Rfl: 0 .  lisinopril (PRINIVIL,ZESTRIL) 20 MG tablet, , Disp: , Rfl:    Allergies  Allergen Reactions  . No Known Allergies      Review of Systems  Constitutional: Negative.   Respiratory: Negative.   Cardiovascular: Positive for leg swelling.  Gastrointestinal: Negative.   Neurological: Negative.   Psychiatric/Behavioral: Negative.      Today's Vitals   12/30/18 1630  BP: (!) 142/80  Pulse: 72  Temp: 98.8 F (37.1 C)  TempSrc: Oral  SpO2: 95%  Weight: 245 lb 9.6 oz  (111.4 kg)  Height: 5' 6.4" (1.687 m)   Body mass index is 39.16 kg/m.   Objective:  Physical Exam Vitals signs and nursing note reviewed.  Constitutional:      Appearance: Normal appearance.  Cardiovascular:     Rate and Rhythm: Normal rate and regular rhythm.     Heart sounds: Normal heart sounds.  Pulmonary:     Effort: Pulmonary effort is normal.     Breath sounds: Normal breath sounds.  Skin:    General: Skin is warm.     Comments: Erythema located b/l LE. Warmth of b/l LE. Erythema goes to knee on RLE and 1/2 way up calf on LLE.   Neurological:     General: No focal deficit present.     Mental Status: He is alert.  Psychiatric:        Mood and Affect: Mood normal.         Assessment And Plan:     1. Restless leg syndrome  Chronic. Sx are somewhat controlled.  He has also been evaluated by Neurology.   2. Cellulitis of right lower extremity  He was given rx keflex tid. He is encouraged to complete full abx course.   3. PVD (peripheral vascular disease) (HCC)  Chronic. He is encouraged to walk 30 minutes five days weekly and take statin daily.   4. History of thyroid nodule  I will refer him for f/u thyroid u/s.   - TSH - US Soft Tissue Head/Neck; Future  5. Hypertensive heart and renal disease with renal failure, stage 1 through stage 4 or unspecified chronic kidney disease, without heart failure  Fair control. He will continue with current meds for now.   6. Need for influenza vaccination  - Flu vaccine HIGH DOSE PF (Fluzone High dose)        [name], MD    THE PATIENT IS ENCOURAGED TO PRACTICE SOCIAL DISTANCING DUE TO THE COVID-19 PANDEMIC.

## 2019-01-06 ENCOUNTER — Telehealth: Payer: Self-pay | Admitting: Internal Medicine

## 2019-01-06 NOTE — Chronic Care Management (AMB) (Signed)
Chronic Care Management   Note  01/06/2019 Name: Franklin Thornton MRN: [id number] DOB: 1946/02/24  Franklin Thornton is a 73 y.o. year old male who is a primary care patient of [name], MD. I reached out to [name] by phone today in response to a referral sent by Franklin Thornton's health plan.    [name] was given information about Chronic Care Management services today including:  1. CCM service includes personalized support from designated clinical staff supervised by his physician, including individualized plan of care and coordination with other care providers 2. 24/7 contact phone numbers for assistance for urgent and routine care needs. 3. Service will only be billed when office clinical staff spend 20 minutes or more in a month to coordinate care. 4. Only one practitioner may furnish and bill the service in a calendar month. 5. The patient may stop CCM services at any time (effective at the end of the month) by phone call to the office staff. 6. The patient will be responsible for cost sharing (co-pay) of up to 20% of the service fee (after annual deductible is met).  Patient agreed to services and verbal consent obtained.   Follow up plan: Telephone appointment with CCM team member scheduled for: 01/13/2019  [place]  ??[email or web address]   ??[phone]

## 2019-01-12 ENCOUNTER — Other Ambulatory Visit: Payer: Self-pay

## 2019-01-12 ENCOUNTER — Ambulatory Visit
Admission: RE | Admit: 2019-01-12 | Discharge: 2019-01-12 | Disposition: A | Discharge status: Home / Self Care | Payer: Medicare Other | Source: Ambulatory Visit | Attending: Nurse Practitioner | Admitting: Nurse Practitioner

## 2019-01-12 ENCOUNTER — Telehealth (INDEPENDENT_AMBULATORY_CARE_PROVIDER_SITE_OTHER): Payer: Medicare Other | Admitting: Nurse Practitioner

## 2019-01-12 ENCOUNTER — Encounter: Payer: Self-pay | Admitting: Nurse Practitioner

## 2019-01-12 VITALS — BP 102/60 | HR 79 | Ht 70.5 in | Wt 245.0 lb

## 2019-01-12 DIAGNOSIS — R05 Cough: Secondary | ICD-10-CM | POA: Diagnosis not present

## 2019-01-12 DIAGNOSIS — R062 Wheezing: Secondary | ICD-10-CM | POA: Diagnosis not present

## 2019-01-12 DIAGNOSIS — R059 Cough, unspecified: Secondary | ICD-10-CM

## 2019-01-12 DIAGNOSIS — Z1159 Encounter for screening for other viral diseases: Secondary | ICD-10-CM | POA: Diagnosis not present

## 2019-01-12 MED ORDER — HYDROCODONE-HOMATROPINE 5-1.5 MG/5ML PO SYRP: 5.0000 mL | ORAL_SOLUTION | Freq: Four times a day (QID) | ORAL | 0 refills | Status: DC | PRN

## 2019-01-12 MED ORDER — ALBUTEROL SULFATE (2.5 MG/3ML) 0.083% IN NEBU: 2.5000 mg | INHALATION_SOLUTION | Freq: Four times a day (QID) | RESPIRATORY_TRACT | 3 refills | Status: DC | PRN

## 2019-01-12 NOTE — Progress Notes (Signed)
Subjective:     Patient ID: Franklin Thornton , male    DOB: 1945/07/08 , 73 y.o.   MRN: [id number]   Chief Complaint  Patient presents with  . URI    patient stated his symptoms started yesterday. patient has a productive cough, throat feels raw, runny nose, postnasal drip. patient denies having a fever.    HPI  URI  This is a new problem. The current episode started yesterday. The problem has been gradually worsening. There has been no fever. Associated symptoms include congestion, coughing and wheezing. Pertinent negatives include no headaches, sinus pain or sore throat. He has tried inhaler use (used his albuterol inhaler last night) for the symptoms.     Past Medical History:  Diagnosis Date  . Atrial fibrillation ([place])   . Diabetes mellitus without complication ([place])   . Peripheral vascular disease ([place])   . Prostate cancer [facility])      Family History  Problem Relation Age of Onset  . Lung cancer Mother   . Diabetes Mother   . Esophageal cancer Father   . Hypertension Sister   . Breast cancer Sister   . Healthy Son   . Healthy Daughter      Current Outpatient Medications:  .  albuterol (PROVENTIL HFA;VENTOLIN HFA) 108 (90 Base) MCG/ACT inhaler, Inhale 2 puffs into the lungs every 6 (six) hours as needed for wheezing or shortness of breath., Disp: , Rfl:  .  amiodarone (PACERONE) 200 MG tablet, Take 200 mg by mouth daily., Disp: , Rfl:  .  aspirin 81 MG tablet, Take 81 mg by mouth daily., Disp: , Rfl:  .  atorvastatin (LIPITOR) 20 MG tablet, TAKE 1 TABLET DAILY, Disp: 90 tablet, Rfl: 2 .  b complex vitamins capsule, Take 1 capsule by mouth daily., Disp: , Rfl:  .  beclomethasone (QVAR) 80 MCG/ACT inhaler, Inhale 2 puffs into the lungs 2 (two) times daily., Disp: , Rfl:  .  carvedilol (COREG) 6.25 MG tablet, Take 6.25 mg by mouth 2 (two) times daily with a meal., Disp: , Rfl:  .  Cholecalciferol (VITAMIN D) 2000 units tablet, Take 2,000 Units by mouth daily., Disp: , Rfl:   .  DULoxetine (CYMBALTA) 30 MG capsule, TAKE 1 CAPSULE DAILY IN THE EVENING, Disp: 90 capsule, Rfl: 3 .  empagliflozin (JARDIANCE) 10 MG TABS tablet, Take 1 tablet by mouth daily, Disp: 90 tablet, Rfl: 1 .  furosemide (LASIX) 20 MG tablet, Take 20 mg by mouth daily., Disp: , Rfl:  .  gabapentin (NEURONTIN) 600 MG tablet, Take 600 mg by mouth 2 (two) times daily., Disp: , Rfl:  .  HYDROcodone-acetaminophen (NORCO/VICODIN) 5-325 MG tablet, Take 1 tablet by mouth every 6 (six) hours as needed for moderate pain., Disp: , Rfl:  .  ipratropium (ATROVENT) 0.03 % nasal spray, Place 2 sprays into both nostrils every 12 (twelve) hours., Disp: , Rfl:  .  levocetirizine (XYZAL) 5 MG tablet, Take 5 mg by mouth every evening., Disp: , Rfl:  .  levothyroxine (SYNTHROID) 50 MCG tablet, TAKE 1 TABLET DAILY, Disp: 90 tablet, Rfl: 2 .  lidocaine (LIDODERM) 5 %, Place 3 patches onto the skin daily. May use 1-3 patches at one time, Remove & Discard patch within 12 hours or as directed by MD, Disp: 90 patch, Rfl: 0 .  Magnesium 250 MG TABS, Take 1 tablet by mouth daily., Disp: , Rfl:  .  mirabegron ER (MYRBETRIQ) 50 MG TB24 tablet, Take 50 mg by mouth daily.,  Disp: , Rfl:  .  Multiple Vitamin (MULTIVITAMIN) tablet, Take 1 tablet by mouth daily., Disp: , Rfl:  .  nitroGLYCERIN (NITROSTAT) 0.4 MG SL tablet, Place 0.4 mg under the tongue every 5 (five) minutes as needed for chest pain., Disp: , Rfl:  .  pantoprazole (PROTONIX) 40 MG tablet, TAKE 1 TABLET DAILY, Disp: 90 tablet, Rfl: 3 .  pramipexole (MIRAPEX) 0.5 MG tablet, Take 1 mg by mouth 3 (three) times daily. , Disp: , Rfl:  .  rivaroxaban (XARELTO) 10 MG TABS tablet, Take 10 mg by mouth daily., Disp: , Rfl:  .  vitamin B-12 (CYANOCOBALAMIN) 1000 MCG tablet, Take 1,000 mcg by mouth daily., Disp: , Rfl:  .  lisinopril (PRINIVIL,ZESTRIL) 20 MG tablet, , Disp: , Rfl:    Allergies  Allergen Reactions  . No Known Allergies      Review of Systems  Constitutional:  Negative for fatigue and fever.  HENT: Positive for congestion. Negative for sinus pain and sore throat.   Eyes: Negative.   Respiratory: Positive for cough, shortness of breath and wheezing.   Neurological: Negative for headaches.     Today's Vitals   01/12/19 1423  BP: 102/60  Pulse: 79  Weight: 245 lb (111.1 kg)  Height: 5' 10.5" (1.791 m)  PainSc: 0-No pain   Body mass index is 34.66 kg/m.   Objective:  Physical Exam Constitutional:      Appearance: Normal appearance.  Cardiovascular:     Rate and Rhythm: Normal rate and regular rhythm.     Pulses: Normal pulses.     Heart sounds: Normal heart sounds. No murmur.  Pulmonary:     Effort: Pulmonary effort is normal. No respiratory distress.     Breath sounds: Wheezing (bilateral upper lobes with expiratory wheezes) present.  Skin:    General: Skin is warm and dry.     Capillary Refill: Capillary refill takes less than 2 seconds.  Neurological:     General: No focal deficit present.     Mental Status: He is alert and oriented to person, place, and time.         Assessment And Plan:     1. Cough  Will check chest xray due to his history of wheezing   He is to also be checked for coronavirus due to the sudden onset  He is advised to remain self isolated until his results come back - DG Chest 2 View; Future - Novel Coronavirus, NAA ([facility]) - albuterol (PROVENTIL) (2.5 MG/3ML) 0.083% nebulizer solution; Take 3 mLs (2.5 mg total) by nebulization every 6 (six) hours as needed for wheezing or shortness of breath.  Dispense: 75 mL; Refill: 3  2. Wheezing  Advised to use his albuterol neb at least twice a day  Wheezes noted to bilateral upper lobes - albuterol (PROVENTIL) (2.5 MG/3ML) 0.083% nebulizer solution; Take 3 mLs (2.5 mg total) by nebulization every 6 (six) hours as needed for wheezing or shortness of breath.  Dispense: 75 mL; Refill: 3    [name], FNP    THE PATIENT IS ENCOURAGED TO PRACTICE  SOCIAL DISTANCING DUE TO THE COVID-19 PANDEMIC.

## 2019-01-13 ENCOUNTER — Telehealth: Payer: Medicare Other

## 2019-01-13 LAB — NOVEL CORONAVIRUS, NAA: SARS-CoV-2, NAA: NOT DETECTED

## 2019-01-18 ENCOUNTER — Encounter: Payer: Self-pay | Admitting: Nurse Practitioner

## 2019-01-22 ENCOUNTER — Other Ambulatory Visit: Payer: Self-pay | Admitting: Nurse Practitioner

## 2019-01-22 ENCOUNTER — Telehealth: Payer: Medicare Other

## 2019-01-22 DIAGNOSIS — E1142 Type 2 diabetes mellitus with diabetic polyneuropathy: Secondary | ICD-10-CM | POA: Diagnosis not present

## 2019-01-22 DIAGNOSIS — G479 Sleep disorder, unspecified: Secondary | ICD-10-CM | POA: Diagnosis not present

## 2019-01-22 DIAGNOSIS — I10 Essential (primary) hypertension: Secondary | ICD-10-CM | POA: Diagnosis not present

## 2019-01-22 DIAGNOSIS — G2581 Restless legs syndrome: Secondary | ICD-10-CM | POA: Diagnosis not present

## 2019-01-22 DIAGNOSIS — G894 Chronic pain syndrome: Secondary | ICD-10-CM | POA: Diagnosis not present

## 2019-01-22 DIAGNOSIS — M4316 Spondylolisthesis, lumbar region: Secondary | ICD-10-CM | POA: Diagnosis not present

## 2019-01-22 DIAGNOSIS — G3184 Mild cognitive impairment, so stated: Secondary | ICD-10-CM | POA: Diagnosis not present

## 2019-01-25 ENCOUNTER — Ambulatory Visit: Payer: Self-pay | Admitting: Pharmacist

## 2019-01-25 DIAGNOSIS — E1122 Type 2 diabetes mellitus with diabetic chronic kidney disease: Secondary | ICD-10-CM

## 2019-01-25 DIAGNOSIS — I1310 Hypertensive heart and chronic kidney disease without heart failure, with stage 1 through stage 4 chronic kidney disease, or unspecified chronic kidney disease: Secondary | ICD-10-CM

## 2019-01-25 NOTE — Progress Notes (Signed)
  Chronic Care Management   Outreach Note  01/25/2019 Name: Franklin Thornton MRN: [id number] DOB: 02/27/1946  Referred by: [name], MD Reason for referral : Chronic Care Management   An unsuccessful telephone outreach was attempted today. The patient was referred to the case management team by for assistance with care management and care coordination.   Follow Up Plan: A HIPPA compliant phone message was left for the patient providing contact information and requesting a return call.  The care management team will reach out to the patient again over the next 5-7 business days.   Regina Eck, PharmD, BCPS Clinical Pharmacist, [facility] [place]: [phone]

## 2019-01-27 ENCOUNTER — Ambulatory Visit (INDEPENDENT_AMBULATORY_CARE_PROVIDER_SITE_OTHER): Payer: Medicare Other | Admitting: Pharmacist

## 2019-01-27 DIAGNOSIS — I1310 Hypertensive heart and chronic kidney disease without heart failure, with stage 1 through stage 4 chronic kidney disease, or unspecified chronic kidney disease: Secondary | ICD-10-CM

## 2019-01-27 DIAGNOSIS — N182 Chronic kidney disease, stage 2 (mild): Secondary | ICD-10-CM

## 2019-01-27 DIAGNOSIS — E1122 Type 2 diabetes mellitus with diabetic chronic kidney disease: Secondary | ICD-10-CM | POA: Diagnosis not present

## 2019-01-28 ENCOUNTER — Ambulatory Visit: Payer: Medicare Other | Admitting: Pharmacist

## 2019-01-28 DIAGNOSIS — E1122 Type 2 diabetes mellitus with diabetic chronic kidney disease: Secondary | ICD-10-CM

## 2019-01-28 DIAGNOSIS — I1310 Hypertensive heart and chronic kidney disease without heart failure, with stage 1 through stage 4 chronic kidney disease, or unspecified chronic kidney disease: Secondary | ICD-10-CM

## 2019-01-28 NOTE — Progress Notes (Signed)
Chronic Care Management   Initial Visit Note  01/28/2019 Name: Franklin Thornton MRN: [id number] DOB: 04-Nov-1945  Referred by: Franklin Chard, MD Reason for referral : No chief complaint on file.   Franklin Thornton is a 73 y.o. year old male who is a primary care patient of Franklin Chard, MD. The CCM team was consulted for assistance with chronic disease management and care coordination needs related to HTN, HLD, DMII  Review of patient status, including review of consultants reports, relevant laboratory and other test results, and collaboration with appropriate care team members and the patient's provider was performed as part of comprehensive patient evaluation and provision of chronic care management services.    I spoke with Franklin Thornton by telephone today.  Advanced Directives Status: N See Care Plan and Vynca application for related entries.   Medications: Outpatient Encounter Medications as of 01/28/2019  Medication Sig  . albuterol (PROVENTIL HFA;VENTOLIN HFA) 108 (90 Base) MCG/ACT inhaler Inhale 2 puffs into the lungs every 6 (six) hours as needed for wheezing or shortness of breath.  [name] albuterol (PROVENTIL) (2.5 MG/3ML) 0.083% nebulizer solution Take 3 mLs (2.5 mg total) by nebulization every 6 (six) hours as needed for wheezing or shortness of breath.  [name] amiodarone (PACERONE) 100 MG tablet Take 100 mg by mouth daily.   [name] aspirin 81 MG tablet Take 81 mg by mouth daily.  [name] atorvastatin (LIPITOR) 20 MG tablet TAKE 1 TABLET DAILY  . b complex vitamins capsule Take 1 capsule by mouth daily.  . budesonide-formoterol (SYMBICORT) 160-4.5 MCG/ACT inhaler Inhale 2 puffs into the lungs 2 (two) times daily.  . carvedilol (COREG) 6.25 MG tablet Take 6.25 mg by mouth 2 (two) times daily with a meal.  . Cholecalciferol (VITAMIN D) 2000 units tablet Take 2,000 Units by mouth daily.  . diphenhydrAMINE (BENADRYL) 25 MG tablet Take 25 mg by mouth every 6 (six) hours as needed for itching.  .  DULoxetine (CYMBALTA) 30 MG capsule TAKE 1 CAPSULE DAILY IN THE EVENING  . empagliflozin (JARDIANCE) 10 MG TABS tablet Take 1 tablet by mouth daily  . furosemide (LASIX) 20 MG tablet Take 20 mg by mouth daily as needed.   . gabapentin (NEURONTIN) 600 MG tablet Take 600 mg by mouth 2 (two) times daily.  [name] glucose monitoring kit (FREESTYLE) monitoring kit 1 each by Does not apply route as needed for other.  . HYDROcodone-acetaminophen (NORCO/VICODIN) 5-325 MG tablet Take 1 tablet by mouth every 6 (six) hours as needed for moderate pain.  [name] HYDROcodone-homatropine (HYDROMET) 5-1.5 MG/5ML syrup Take 5 mLs by mouth every 6 (six) hours as needed.  [name] ipratropium (ATROVENT) 0.03 % nasal spray Place 2 sprays into both nostrils every 12 (twelve) hours.  [name] levothyroxine (SYNTHROID) 50 MCG tablet TAKE 1 TABLET DAILY  . losartan (COZAAR) 50 MG tablet Take 50 mg by mouth daily.  . Magnesium 250 MG TABS Take 1 tablet by mouth daily.  . methocarbamol (ROBAXIN) 750 MG tablet Take 750 mg by mouth 3 (three) times daily as needed for muscle spasms.  . mirabegron ER (MYRBETRIQ) 50 MG TB24 tablet Take 50 mg by mouth daily.  . montelukast (SINGULAIR) 10 MG tablet Take 10 mg by mouth at bedtime.  . Multiple Vitamin (MULTIVITAMIN) tablet Take 1 tablet by mouth daily.  . nitroGLYCERIN (NITROSTAT) 0.4 MG SL tablet Place 0.4 mg under the tongue every 5 (five) minutes as needed for chest pain.  . Omega-3 Fatty Acids (FISH OIL) 1000 MG CAPS Take  1,000 mg by mouth.  [name] OZEMPIC, 0.25 OR 0.5 MG/DOSE, 2 MG/1.5ML SOPN INJECT 0.5MG UNDER THE SKIN EVERY WEEK  . pantoprazole (PROTONIX) 40 MG tablet TAKE 1 TABLET DAILY  . pramipexole (MIRAPEX) 0.5 MG tablet Take 1 mg by mouth 3 (three) times daily.   . rivaroxaban (XARELTO) 10 MG TABS tablet Take 10 mg by mouth daily.  . solifenacin (VESICARE) 10 MG tablet Take 10 mg by mouth daily.  . vitamin B-12 (CYANOCOBALAMIN) 1000 MCG tablet Take 1,000 mcg by mouth daily.  [name] levocetirizine  (XYZAL) 5 MG tablet Take 5 mg by mouth every evening.  . lidocaine (LIDODERM) 5 % Place 3 patches onto the skin daily. May use 1-3 patches at one time, Remove & Discard patch within 12 hours or as directed by MD (Patient not taking: Reported on 01/28/2019)  . [DISCONTINUED] beclomethasone (QVAR) 80 MCG/ACT inhaler Inhale 2 puffs into the lungs 2 (two) times daily.  . [DISCONTINUED] lisinopril (PRINIVIL,ZESTRIL) 20 MG tablet    No facility-administered encounter medications on file as of 01/28/2019.      Objective:   Goals Addressed            This Visit's Progress     Patient Stated   . I would like to continue to manage my chronic conditions (pt-stated)       Current Barriers:  Diabetes . Diabetes: T2DM; most recent A1c 6.1% on 11/18/18  . Current antihyperglycemic regimen: Jardiance 49m, Ozempic 0.54mweekly . denies hypoglycemic symptoms; denies hyperglycemia symptoms . Current meal patterns: n/a . 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 Current blood glucose readings: FBG<130, current BG 118 at 2:13pm . Cardiovascular risk reduction: o Current hypertensive regimen: losartan, carvedilol o Current hyperlipidemia regimen: atorvastatin 2049maily  Asthma . Patient is now on Symbicort maintenance inhaler.  He has not been using this twice daily as directed. . Counseled patient to use this twice daily as prescribed. . He states he has his albuterol inhaler and nebulizer for rescue. .  Marland Kitchenharmacist Clinical Goal(s):  . [name] the next 90 days, patient with work with PharmD and primary care provider to address needs related to optimized medication management of chronic conditions  Interventions: . Comprehensive medication review performed, medication list updated in electronic medical record . Multiple changes to medications have been made since last PCP visit  Patient Self Care Activities:  . Patient will check blood glucose daily , document, and provide at future appointments .  Patient will focus on medication adherence by continuing to take medications as prescribed. . Patient will take medications as prescribed . Patient will contact provider with any episodes of hypoglycemia . Patient will report any questions or concerns to provider   Initial goal documentation        Plan:   The care management team will reach out to the patient again over the next 4-6 weeks.  JulRegina EckharmD, BCPS Clinical Pharmacist, [facility] [phone]

## 2019-01-28 NOTE — Patient Instructions (Addendum)
Visit Information  Goals Addressed            This Visit's Progress     Patient Stated   . I would like to continue to manage my chronic conditions (pt-stated)       Current Barriers:  Diabetes . Diabetes: T2DM; most recent A1c 6.1% on 11/18/18  . Current antihyperglycemic regimen: Jardiance 10mg , Ozempic 0.5mg  weekly . denies hypoglycemic symptoms; denies hyperglycemia symptoms . Current meal patterns: n/a . 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 Current blood glucose readings: FBG<130, current BG 118 at 2:13pm . Cardiovascular risk reduction: o Current hypertensive regimen: losartan, carvedilol o Current hyperlipidemia regimen: atorvastatin 20mg  daily  Asthma . Patient is now on Symbicort maintenance inhaler.  He has not been using this twice daily as directed. . Counseled patient to use this twice daily as prescribed. . He states he has his albuterol inhaler and nebulizer for rescue. [name]  Pharmacist Clinical Goal(s):  [name] Over the next 90 days, patient with work with PharmD and primary care provider to address needs related to optimized medication management of chronic conditions  Interventions: . Comprehensive medication review performed, medication list updated in electronic medical record . Multiple changes to medications have been made since last PCP visit  Patient Self Care Activities:  . Patient will check blood glucose daily , document, and provide at future appointments . Patient will focus on medication adherence by continuing to take medications as prescribed. . Patient will take medications as prescribed . Patient will contact provider with any episodes of hypoglycemia . Patient will report any questions or concerns to provider   Initial goal documentation        The patient verbalized understanding of instructions provided today and declined a print copy of patient instruction materials.   The care management team will reach out to the patient again over the  next 4-6 weeks.  Regina Eck, PharmD, BCPS Clinical Pharmacist, [facility] [place]: [phone]

## 2019-02-02 NOTE — Progress Notes (Signed)
Chronic Care Management   Initial Visit Note  01/27/2019 Name: Franklin Thornton MRN: [id number] DOB: [birth year]  Referred by: Franklin Chard, MD Reason for referral : Chronic Care Management   Franklin Thornton is a 73 y.o. year old male who is a primary care patient of Franklin Chard, MD. The CCM team was consulted for assistance with chronic disease management and care coordination needs related to HLD and DMII  Review of patient status, including review of consultants reports, relevant laboratory and other test results, and collaboration with appropriate care team members and the patient's provider was performed as part of comprehensive patient evaluation and provision of chronic care management services.    I spoke with Franklin Thornton by telephone today.  Advanced Directives Status: N See Care Plan and Vynca application for related entries.   Medications: Outpatient Encounter Medications as of 01/27/2019  Medication Sig  . albuterol (PROVENTIL HFA;VENTOLIN HFA) 108 (90 Base) MCG/ACT inhaler Inhale 2 puffs into the lungs every 6 (six) hours as needed for wheezing or shortness of breath.  [name] albuterol (PROVENTIL) (2.5 MG/3ML) 0.083% nebulizer solution Take 3 mLs (2.5 mg total) by nebulization every 6 (six) hours as needed for wheezing or shortness of breath.  [name] amiodarone (PACERONE) 100 MG tablet Take 100 mg by mouth daily.   [name] aspirin 81 MG tablet Take 81 mg by mouth daily.  [name] atorvastatin (LIPITOR) 20 MG tablet TAKE 1 TABLET DAILY  . b complex vitamins capsule Take 1 capsule by mouth daily.  . carvedilol (COREG) 6.25 MG tablet Take 6.25 mg by mouth 2 (two) times daily with a meal.  . Cholecalciferol (VITAMIN D) 2000 units tablet Take 2,000 Units by mouth daily.  . DULoxetine (CYMBALTA) 30 MG capsule TAKE 1 CAPSULE DAILY IN THE EVENING  . empagliflozin (JARDIANCE) 10 MG TABS tablet Take 1 tablet by mouth daily  . furosemide (LASIX) 20 MG tablet Take 20 mg by mouth daily as needed.   .  gabapentin (NEURONTIN) 600 MG tablet Take 600 mg by mouth 2 (two) times daily.  [name] HYDROcodone-acetaminophen (NORCO/VICODIN) 5-325 MG tablet Take 1 tablet by mouth every 6 (six) hours as needed for moderate pain.  [name] HYDROcodone-homatropine (HYDROMET) 5-1.5 MG/5ML syrup Take 5 mLs by mouth every 6 (six) hours as needed.  [name] ipratropium (ATROVENT) 0.03 % nasal spray Place 2 sprays into both nostrils every 12 (twelve) hours.  [name] levocetirizine (XYZAL) 5 MG tablet Take 5 mg by mouth every evening.  [name] levothyroxine (SYNTHROID) 50 MCG tablet TAKE 1 TABLET DAILY  . lidocaine (LIDODERM) 5 % Place 3 patches onto the skin daily. May use 1-3 patches at one time, Remove & Discard patch within 12 hours or as directed by MD (Patient not taking: Reported on 01/28/2019)  . Magnesium 250 MG TABS Take 1 tablet by mouth daily.  . mirabegron ER (MYRBETRIQ) 50 MG TB24 tablet Take 50 mg by mouth daily.  . Multiple Vitamin (MULTIVITAMIN) tablet Take 1 tablet by mouth daily.  . nitroGLYCERIN (NITROSTAT) 0.4 MG SL tablet Place 0.4 mg under the tongue every 5 (five) minutes as needed for chest pain.  [name] OZEMPIC, 0.25 OR 0.5 MG/DOSE, 2 MG/1.5ML SOPN INJECT 0.5MG  UNDER THE SKIN EVERY WEEK  . pantoprazole (PROTONIX) 40 MG tablet TAKE 1 TABLET DAILY  . pramipexole (MIRAPEX) 0.5 MG tablet Take 1 mg by mouth 3 (three) times daily.   . rivaroxaban (XARELTO) 10 MG TABS tablet Take 10 mg by mouth daily.  . vitamin B-12 (CYANOCOBALAMIN) 1000  MCG tablet Take 1,000 mcg by mouth daily.  . [DISCONTINUED] beclomethasone (QVAR) 80 MCG/ACT inhaler Inhale 2 puffs into the lungs 2 (two) times daily.  . [DISCONTINUED] lisinopril (PRINIVIL,ZESTRIL) 20 MG tablet    No facility-administered encounter medications on file as of 01/27/2019.      Objective:   Goals Addressed            This Visit's Progress     Patient Stated   . I would like to continue to manage my chronic conditions (pt-stated)       Current Barriers:  Diabetes .  Diabetes: T2DM; most recent A1c 6.1% on 11/18/18  . Current antihyperglycemic regimen: Jardiance 10mg , Ozempic 0.5mg  weekly . denies hypoglycemic symptoms; denies hyperglycemia symptoms . Current meal patterns: n/a . 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 Current blood glucose readings: FBG<130, current BG 118 at 2:13pm . Cardiovascular risk reduction: o Current hypertensive regimen: losartan, carvedilol o Current hyperlipidemia regimen: atorvastatin 20mg  daily  Asthma . Patient is now on Symbicort maintenance inhaler.  He has not been using this twice daily as directed. . Counseled patient to use this twice daily as prescribed. . He states he has his albuterol inhaler and nebulizer for rescue. [name]  Pharmacist Clinical Goal(s):  [name] Over the next 90 days, patient with work with PharmD and primary care provider to address needs related to optimized medication management of chronic conditions  Interventions: . Comprehensive medication review performed, medication list updated in electronic medical record . Multiple changes to medications have been made since last PCP visit  Patient Self Care Activities:  . Patient will check blood glucose daily , document, and provide at future appointments . Patient will focus on medication adherence by continuing to take medications as prescribed. . Patient will take medications as prescribed . Patient will contact provider with any episodes of hypoglycemia . Patient will report any questions or concerns to provider   Initial goal documentation         Plan:   The care management team will reach out to the patient again over the next 6 weeks.  Regina Eck, PharmD, BCPS Clinical Pharmacist, [facility] [place]: [phone]

## 2019-02-02 NOTE — Patient Instructions (Signed)
Visit Information  Goals Addressed            This Visit's Progress     Patient Stated   . I would like to continue to manage my chronic conditions (pt-stated)       Current Barriers:  Diabetes . Diabetes: T2DM; most recent A1c 6.1% on 11/18/18  . Current antihyperglycemic regimen: Jardiance 10mg , Ozempic 0.5mg  weekly . denies hypoglycemic symptoms; denies hyperglycemia symptoms . Current meal patterns: n/a . 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 Current blood glucose readings: FBG<130, current BG 118 at 2:13pm . Cardiovascular risk reduction: o Current hypertensive regimen: losartan, carvedilol o Current hyperlipidemia regimen: atorvastatin 20mg  daily  Asthma . Patient is now on Symbicort maintenance inhaler.  He has not been using this twice daily as directed. . Counseled patient to use this twice daily as prescribed. . He states he has his albuterol inhaler and nebulizer for rescue. [name]  Pharmacist Clinical Goal(s):  [name] Over the next 90 days, patient with work with PharmD and primary care provider to address needs related to optimized medication management of chronic conditions  Interventions: . Comprehensive medication review performed, medication list updated in electronic medical record . Multiple changes to medications have been made since last PCP visit  Patient Self Care Activities:  . Patient will check blood glucose daily , document, and provide at future appointments . Patient will focus on medication adherence by continuing to take medications as prescribed. . Patient will take medications as prescribed . Patient will contact provider with any episodes of hypoglycemia . Patient will report any questions or concerns to provider   Initial goal documentation        The patient verbalized understanding of instructions provided today and declined a print copy of patient instruction materials.   The care management team will reach out to the patient again over the  next 6 weeks.  Regina Eck, PharmD, BCPS Clinical Pharmacist, [facility] [place]: [phone]

## 2019-02-09 ENCOUNTER — Telehealth: Payer: Self-pay

## 2019-02-19 DIAGNOSIS — M47816 Spondylosis without myelopathy or radiculopathy, lumbar region: Secondary | ICD-10-CM | POA: Diagnosis not present

## 2019-02-19 DIAGNOSIS — E785 Hyperlipidemia, unspecified: Secondary | ICD-10-CM | POA: Diagnosis not present

## 2019-02-19 DIAGNOSIS — M47817 Spondylosis without myelopathy or radiculopathy, lumbosacral region: Secondary | ICD-10-CM | POA: Diagnosis not present

## 2019-02-19 DIAGNOSIS — S39012A Strain of muscle, fascia and tendon of lower back, initial encounter: Secondary | ICD-10-CM | POA: Diagnosis not present

## 2019-02-19 DIAGNOSIS — I10 Essential (primary) hypertension: Secondary | ICD-10-CM | POA: Diagnosis not present

## 2019-02-19 DIAGNOSIS — M5136 Other intervertebral disc degeneration, lumbar region: Secondary | ICD-10-CM | POA: Diagnosis not present

## 2019-02-19 DIAGNOSIS — J45909 Unspecified asthma, uncomplicated: Secondary | ICD-10-CM | POA: Diagnosis not present

## 2019-02-19 DIAGNOSIS — E119 Type 2 diabetes mellitus without complications: Secondary | ICD-10-CM | POA: Diagnosis not present

## 2019-02-19 DIAGNOSIS — Z7982 Long term (current) use of aspirin: Secondary | ICD-10-CM | POA: Diagnosis not present

## 2019-02-19 DIAGNOSIS — G8911 Acute pain due to trauma: Secondary | ICD-10-CM | POA: Diagnosis not present

## 2019-02-19 DIAGNOSIS — Z79899 Other long term (current) drug therapy: Secondary | ICD-10-CM | POA: Diagnosis not present

## 2019-02-19 DIAGNOSIS — Z87891 Personal history of nicotine dependence: Secondary | ICD-10-CM | POA: Diagnosis not present

## 2019-02-19 DIAGNOSIS — M5137 Other intervertebral disc degeneration, lumbosacral region: Secondary | ICD-10-CM | POA: Diagnosis not present

## 2019-03-02 ENCOUNTER — Ambulatory Visit: Payer: Self-pay | Admitting: Internal Medicine

## 2019-03-09 ENCOUNTER — Telehealth: Payer: Medicare Other | Admitting: Pharmacist

## 2019-03-15 DIAGNOSIS — H1131 Conjunctival hemorrhage, right eye: Secondary | ICD-10-CM | POA: Diagnosis not present

## 2019-03-24 DIAGNOSIS — E782 Mixed hyperlipidemia: Secondary | ICD-10-CM | POA: Diagnosis not present

## 2019-03-24 DIAGNOSIS — E1169 Type 2 diabetes mellitus with other specified complication: Secondary | ICD-10-CM | POA: Diagnosis not present

## 2019-03-24 DIAGNOSIS — I10 Essential (primary) hypertension: Secondary | ICD-10-CM | POA: Diagnosis not present

## 2019-03-24 DIAGNOSIS — I482 Chronic atrial fibrillation, unspecified: Secondary | ICD-10-CM | POA: Diagnosis not present

## 2019-03-24 DIAGNOSIS — E785 Hyperlipidemia, unspecified: Secondary | ICD-10-CM | POA: Diagnosis not present

## 2019-03-24 DIAGNOSIS — I739 Peripheral vascular disease, unspecified: Secondary | ICD-10-CM | POA: Diagnosis not present

## 2019-03-24 DIAGNOSIS — E1165 Type 2 diabetes mellitus with hyperglycemia: Secondary | ICD-10-CM | POA: Diagnosis not present

## 2019-03-24 DIAGNOSIS — I2510 Atherosclerotic heart disease of native coronary artery without angina pectoris: Secondary | ICD-10-CM | POA: Diagnosis not present

## 2019-04-09 ENCOUNTER — Telehealth: Payer: Self-pay

## 2019-04-19 ENCOUNTER — Telehealth: Payer: Medicare Other | Admitting: Pharmacist

## 2019-04-26 DIAGNOSIS — E1169 Type 2 diabetes mellitus with other specified complication: Secondary | ICD-10-CM | POA: Diagnosis not present

## 2019-04-26 DIAGNOSIS — I739 Peripheral vascular disease, unspecified: Secondary | ICD-10-CM | POA: Diagnosis not present

## 2019-04-26 DIAGNOSIS — I482 Chronic atrial fibrillation, unspecified: Secondary | ICD-10-CM | POA: Diagnosis not present

## 2019-04-26 DIAGNOSIS — R079 Chest pain, unspecified: Secondary | ICD-10-CM | POA: Diagnosis not present

## 2019-04-26 DIAGNOSIS — E785 Hyperlipidemia, unspecified: Secondary | ICD-10-CM | POA: Diagnosis not present

## 2019-04-26 LAB — HM DIABETES EYE EXAM

## 2019-04-27 ENCOUNTER — Encounter: Payer: Self-pay | Admitting: Internal Medicine

## 2019-05-04 ENCOUNTER — Other Ambulatory Visit: Payer: Self-pay | Admitting: Internal Medicine

## 2019-05-07 ENCOUNTER — Other Ambulatory Visit: Payer: Self-pay | Admitting: Internal Medicine

## 2019-05-07 DIAGNOSIS — N182 Chronic kidney disease, stage 2 (mild): Secondary | ICD-10-CM

## 2019-05-07 DIAGNOSIS — E1122 Type 2 diabetes mellitus with diabetic chronic kidney disease: Secondary | ICD-10-CM

## 2019-05-24 ENCOUNTER — Telehealth: Payer: Self-pay

## 2019-05-31 DIAGNOSIS — Z86010 Personal history of colonic polyps: Secondary | ICD-10-CM | POA: Diagnosis not present

## 2019-05-31 DIAGNOSIS — K5900 Constipation, unspecified: Secondary | ICD-10-CM | POA: Diagnosis not present

## 2019-05-31 DIAGNOSIS — I2510 Atherosclerotic heart disease of native coronary artery without angina pectoris: Secondary | ICD-10-CM | POA: Diagnosis not present

## 2019-06-03 DIAGNOSIS — E782 Mixed hyperlipidemia: Secondary | ICD-10-CM | POA: Diagnosis not present

## 2019-06-03 DIAGNOSIS — I739 Peripheral vascular disease, unspecified: Secondary | ICD-10-CM | POA: Diagnosis not present

## 2019-06-03 DIAGNOSIS — I2510 Atherosclerotic heart disease of native coronary artery without angina pectoris: Secondary | ICD-10-CM | POA: Diagnosis not present

## 2019-06-03 DIAGNOSIS — E1169 Type 2 diabetes mellitus with other specified complication: Secondary | ICD-10-CM | POA: Diagnosis not present

## 2019-06-03 DIAGNOSIS — E785 Hyperlipidemia, unspecified: Secondary | ICD-10-CM | POA: Diagnosis not present

## 2019-06-03 DIAGNOSIS — E1165 Type 2 diabetes mellitus with hyperglycemia: Secondary | ICD-10-CM | POA: Diagnosis not present

## 2019-06-03 DIAGNOSIS — I10 Essential (primary) hypertension: Secondary | ICD-10-CM | POA: Diagnosis not present

## 2019-06-24 DIAGNOSIS — Z86010 Personal history of colonic polyps: Secondary | ICD-10-CM | POA: Diagnosis not present

## 2019-06-24 DIAGNOSIS — D122 Benign neoplasm of ascending colon: Secondary | ICD-10-CM | POA: Diagnosis not present

## 2019-06-24 DIAGNOSIS — D124 Benign neoplasm of descending colon: Secondary | ICD-10-CM | POA: Diagnosis not present

## 2019-06-24 DIAGNOSIS — K635 Polyp of colon: Secondary | ICD-10-CM | POA: Diagnosis not present

## 2019-06-24 DIAGNOSIS — D123 Benign neoplasm of transverse colon: Secondary | ICD-10-CM | POA: Diagnosis not present

## 2019-06-24 LAB — HM COLONOSCOPY

## 2019-06-28 ENCOUNTER — Encounter: Payer: Self-pay | Admitting: Internal Medicine

## 2019-06-29 DIAGNOSIS — G3184 Mild cognitive impairment, so stated: Secondary | ICD-10-CM | POA: Diagnosis not present

## 2019-06-29 DIAGNOSIS — G479 Sleep disorder, unspecified: Secondary | ICD-10-CM | POA: Diagnosis not present

## 2019-06-29 DIAGNOSIS — E1142 Type 2 diabetes mellitus with diabetic polyneuropathy: Secondary | ICD-10-CM | POA: Diagnosis not present

## 2019-06-29 DIAGNOSIS — I10 Essential (primary) hypertension: Secondary | ICD-10-CM | POA: Diagnosis not present

## 2019-06-29 DIAGNOSIS — M4316 Spondylolisthesis, lumbar region: Secondary | ICD-10-CM | POA: Diagnosis not present

## 2019-06-29 DIAGNOSIS — G894 Chronic pain syndrome: Secondary | ICD-10-CM | POA: Diagnosis not present

## 2019-06-29 DIAGNOSIS — G2581 Restless legs syndrome: Secondary | ICD-10-CM | POA: Diagnosis not present

## 2019-07-14 ENCOUNTER — Telehealth: Payer: Self-pay

## 2019-07-27 DIAGNOSIS — Z7951 Long term (current) use of inhaled steroids: Secondary | ICD-10-CM | POA: Diagnosis not present

## 2019-07-27 DIAGNOSIS — R600 Localized edema: Secondary | ICD-10-CM | POA: Diagnosis not present

## 2019-07-27 DIAGNOSIS — Z8546 Personal history of malignant neoplasm of prostate: Secondary | ICD-10-CM | POA: Diagnosis not present

## 2019-07-27 DIAGNOSIS — Z955 Presence of coronary angioplasty implant and graft: Secondary | ICD-10-CM | POA: Diagnosis not present

## 2019-07-27 DIAGNOSIS — L03115 Cellulitis of right lower limb: Secondary | ICD-10-CM | POA: Diagnosis not present

## 2019-07-27 DIAGNOSIS — I129 Hypertensive chronic kidney disease with stage 1 through stage 4 chronic kidney disease, or unspecified chronic kidney disease: Secondary | ICD-10-CM | POA: Diagnosis not present

## 2019-07-27 DIAGNOSIS — G8911 Acute pain due to trauma: Secondary | ICD-10-CM | POA: Diagnosis not present

## 2019-07-27 DIAGNOSIS — M1990 Unspecified osteoarthritis, unspecified site: Secondary | ICD-10-CM | POA: Diagnosis not present

## 2019-07-27 DIAGNOSIS — Z79899 Other long term (current) drug therapy: Secondary | ICD-10-CM | POA: Diagnosis not present

## 2019-07-27 DIAGNOSIS — E890 Postprocedural hypothyroidism: Secondary | ICD-10-CM | POA: Diagnosis not present

## 2019-07-27 DIAGNOSIS — N182 Chronic kidney disease, stage 2 (mild): Secondary | ICD-10-CM | POA: Diagnosis not present

## 2019-07-27 DIAGNOSIS — G2581 Restless legs syndrome: Secondary | ICD-10-CM | POA: Diagnosis not present

## 2019-07-27 DIAGNOSIS — G40804 Other epilepsy, intractable, without status epilepticus: Secondary | ICD-10-CM | POA: Diagnosis not present

## 2019-07-27 DIAGNOSIS — M7731 Calcaneal spur, right foot: Secondary | ICD-10-CM | POA: Diagnosis not present

## 2019-07-27 DIAGNOSIS — E785 Hyperlipidemia, unspecified: Secondary | ICD-10-CM | POA: Diagnosis not present

## 2019-07-27 DIAGNOSIS — M79661 Pain in right lower leg: Secondary | ICD-10-CM | POA: Diagnosis not present

## 2019-07-27 DIAGNOSIS — M7989 Other specified soft tissue disorders: Secondary | ICD-10-CM | POA: Diagnosis not present

## 2019-07-27 DIAGNOSIS — E1122 Type 2 diabetes mellitus with diabetic chronic kidney disease: Secondary | ICD-10-CM | POA: Diagnosis not present

## 2019-07-27 DIAGNOSIS — M79604 Pain in right leg: Secondary | ICD-10-CM | POA: Diagnosis not present

## 2019-07-27 DIAGNOSIS — G4733 Obstructive sleep apnea (adult) (pediatric): Secondary | ICD-10-CM | POA: Diagnosis not present

## 2019-07-27 DIAGNOSIS — Z87891 Personal history of nicotine dependence: Secondary | ICD-10-CM | POA: Diagnosis not present

## 2019-07-27 DIAGNOSIS — E1151 Type 2 diabetes mellitus with diabetic peripheral angiopathy without gangrene: Secondary | ICD-10-CM | POA: Diagnosis not present

## 2019-07-27 DIAGNOSIS — Z7901 Long term (current) use of anticoagulants: Secondary | ICD-10-CM | POA: Diagnosis not present

## 2019-07-27 DIAGNOSIS — J45909 Unspecified asthma, uncomplicated: Secondary | ICD-10-CM | POA: Diagnosis not present

## 2019-07-27 DIAGNOSIS — G8929 Other chronic pain: Secondary | ICD-10-CM | POA: Diagnosis not present

## 2019-07-27 DIAGNOSIS — Z7982 Long term (current) use of aspirin: Secondary | ICD-10-CM | POA: Diagnosis not present

## 2019-07-28 DIAGNOSIS — L03115 Cellulitis of right lower limb: Secondary | ICD-10-CM | POA: Diagnosis not present

## 2019-07-29 ENCOUNTER — Other Ambulatory Visit: Payer: Self-pay | Admitting: Internal Medicine

## 2019-07-29 ENCOUNTER — Encounter: Payer: Self-pay | Admitting: Internal Medicine

## 2019-08-18 ENCOUNTER — Telehealth: Payer: Self-pay | Admitting: Internal Medicine

## 2019-08-18 NOTE — Chronic Care Management (AMB) (Signed)
  Chronic Care Management   Note  08/18/2019 Name: Franklin Thornton MRN: [id number] DOB: [birth year]  Franklin Thornton is a 74 y.o. year old male who is a primary care patient of [name], MD and is actively engaged with the care management team. I reached out to [name] by phone today to assist with scheduling an initial visit with the Pharmacist in response to referral sent by [name] health plan.  Follow up plan: Telephone appointment with care management team member scheduled for:08/27/2019  [place], [place], [place] [address] Direct Dial: [email or web address]: [email or web address]

## 2019-08-24 DIAGNOSIS — M4316 Spondylolisthesis, lumbar region: Secondary | ICD-10-CM | POA: Diagnosis not present

## 2019-08-24 DIAGNOSIS — G2581 Restless legs syndrome: Secondary | ICD-10-CM | POA: Diagnosis not present

## 2019-08-24 DIAGNOSIS — E1142 Type 2 diabetes mellitus with diabetic polyneuropathy: Secondary | ICD-10-CM | POA: Diagnosis not present

## 2019-08-24 DIAGNOSIS — G894 Chronic pain syndrome: Secondary | ICD-10-CM | POA: Diagnosis not present

## 2019-08-24 DIAGNOSIS — G3184 Mild cognitive impairment, so stated: Secondary | ICD-10-CM | POA: Diagnosis not present

## 2019-08-24 DIAGNOSIS — G479 Sleep disorder, unspecified: Secondary | ICD-10-CM | POA: Diagnosis not present

## 2019-08-25 ENCOUNTER — Telehealth: Payer: Self-pay | Admitting: Internal Medicine

## 2019-08-25 NOTE — Chronic Care Management (AMB) (Signed)
  Care Management   Note  08/25/2019 Name: Franklin Thornton MRN: [id number] DOB: 08-22-1945  Franklin Thornton is a 74 y.o. year old male who is a primary care patient of [name], MD and is actively engaged with the care management team. I reached out to [name] by phone today to assist with re-scheduling an initial visit with the Pharmacist  Follow up plan: Telephone appointment with care management team member scheduled for: 09/17/2019.  [place], [place] [address] Direct Dial: [phone] [email or web address]: [email or web address]

## 2019-08-27 ENCOUNTER — Telehealth: Payer: Medicare Other

## 2019-09-16 DIAGNOSIS — R5383 Other fatigue: Secondary | ICD-10-CM | POA: Diagnosis not present

## 2019-09-16 DIAGNOSIS — G4733 Obstructive sleep apnea (adult) (pediatric): Secondary | ICD-10-CM | POA: Diagnosis not present

## 2019-09-16 DIAGNOSIS — R918 Other nonspecific abnormal finding of lung field: Secondary | ICD-10-CM | POA: Diagnosis not present

## 2019-09-16 DIAGNOSIS — J301 Allergic rhinitis due to pollen: Secondary | ICD-10-CM | POA: Diagnosis not present

## 2019-09-16 DIAGNOSIS — G2581 Restless legs syndrome: Secondary | ICD-10-CM | POA: Diagnosis not present

## 2019-09-16 DIAGNOSIS — J4520 Mild intermittent asthma, uncomplicated: Secondary | ICD-10-CM | POA: Diagnosis not present

## 2019-09-17 ENCOUNTER — Telehealth: Payer: Medicare Other

## 2019-09-22 DIAGNOSIS — J4520 Mild intermittent asthma, uncomplicated: Secondary | ICD-10-CM | POA: Diagnosis not present

## 2019-09-22 DIAGNOSIS — G4733 Obstructive sleep apnea (adult) (pediatric): Secondary | ICD-10-CM | POA: Diagnosis not present

## 2019-09-30 DIAGNOSIS — R5383 Other fatigue: Secondary | ICD-10-CM | POA: Diagnosis not present

## 2019-09-30 DIAGNOSIS — R918 Other nonspecific abnormal finding of lung field: Secondary | ICD-10-CM | POA: Diagnosis not present

## 2019-09-30 DIAGNOSIS — J4520 Mild intermittent asthma, uncomplicated: Secondary | ICD-10-CM | POA: Diagnosis not present

## 2019-09-30 DIAGNOSIS — G4733 Obstructive sleep apnea (adult) (pediatric): Secondary | ICD-10-CM | POA: Diagnosis not present

## 2019-09-30 DIAGNOSIS — G2581 Restless legs syndrome: Secondary | ICD-10-CM | POA: Diagnosis not present

## 2019-09-30 DIAGNOSIS — J301 Allergic rhinitis due to pollen: Secondary | ICD-10-CM | POA: Diagnosis not present

## 2019-10-21 ENCOUNTER — Telehealth: Payer: Medicare Other

## 2019-10-21 NOTE — Chronic Care Management (AMB) (Deleted)
Chronic Care Management Pharmacy  Name: Franklin Thornton  MRN: [id number] DOB: July 02, 1945  Chief Complaint/ HPI  Franklin Thornton,  74 y.o. , male presents for their Initial CCM visit with the clinical pharmacist {[facility] visit TIWP:[id number]}.  PCP : [name], MD  Their chronic conditions include: HTN, PVD, Atherosclerosis of native coronary artery of native heart without angina pectoris, HLD, Obstructive sleep apnea, Type 2 DM, OAB, Malignant neoplasm of prostate, ED, Memory loss, RLS.   Office Visits:*** 12/30/2018 - cellulitis - keflex tid. Encouraged walking. Refer for f/u thyroid u/s and TSH drawn.   Consult Visit:*** 10/20/2019 - Otolarynology - raspy voice x1 year. Ordered clotrimazole troche qid x10d.  08/24/2019 - RLS neuro visit - gabapentin/mirapex continued.  07/27/2019 - ED visit for Cellulitis or right leg. Keflex 500 mg qid x7d started. Lactinex tid with meals.  06/29/2019 - RLS Neuro visit - decrease mirapex dosing. Increase gabapentin 600 mg am and 1200 mg qpm.  06/03/2019 - Echo with Cardiology - Left Ventricle: The calculated left ventricular ejection fraction is   63%. There is mild hypertrophy with basal septal accentuation. Systolic function is normal. EF: 60-65%. Wall motion is within normal limits.   04/26/2019 - Cardiology - chest pain likely musculoskeletal recommend NSAID/Tylenol otc. Continue current bp/cholesterol therapy. Use lasix to help with lower extremity edema. Neuropathy in legs - needs to see PCP to address.  03/24/2019 - Use lasix a few times a week and avoid salty food. Recommend mediterranean diet and exercise.  CCM Visits: *** 08/25/2019 - scheduled to meet with pharmacist.  Medications: Outpatient Encounter Medications as of 10/21/2019  Medication Sig  . albuterol (PROVENTIL HFA;VENTOLIN HFA) 108 (90 Base) MCG/ACT inhaler Inhale 2 puffs into the lungs every 6 (six) hours as needed for wheezing or shortness of breath.  [name]  albuterol (PROVENTIL) (2.5 MG/3ML) 0.083% nebulizer solution Take 3 mLs (2.5 mg total) by nebulization every 6 (six) hours as needed for wheezing or shortness of breath.  [name] amiodarone (PACERONE) 100 MG tablet Take 100 mg by mouth daily.   [name] aspirin 81 MG tablet Take 81 mg by mouth daily.  [name] atorvastatin (LIPITOR) 20 MG tablet TAKE 1 TABLET DAILY  . b complex vitamins capsule Take 1 capsule by mouth daily.  . budesonide-formoterol (SYMBICORT) 160-4.5 MCG/ACT inhaler Inhale 2 puffs into the lungs 2 (two) times daily.  . carvedilol (COREG) 6.25 MG tablet Take 6.25 mg by mouth 2 (two) times daily with a meal.  . Cholecalciferol (VITAMIN D) 2000 units tablet Take 2,000 Units by mouth daily.  . diphenhydrAMINE (BENADRYL) 25 MG tablet Take 25 mg by mouth every 6 (six) hours as needed for itching.  . DULoxetine (CYMBALTA) 30 MG capsule TAKE 1 CAPSULE DAILY IN THE EVENING  . furosemide (LASIX) 20 MG tablet Take 20 mg by mouth daily as needed.   . gabapentin (NEURONTIN) 600 MG tablet Take 600 mg by mouth 2 (two) times daily.  [name] glucose monitoring kit (FREESTYLE) monitoring kit 1 each by Does not apply route as needed for other.  . HYDROcodone-acetaminophen (NORCO/VICODIN) 5-325 MG tablet Take 1 tablet by mouth every 6 (six) hours as needed for moderate pain.  [name] HYDROcodone-homatropine (HYDROMET) 5-1.5 MG/5ML syrup Take 5 mLs by mouth every 6 (six) hours as needed.  [name] ipratropium (ATROVENT) 0.03 % nasal spray Place 2 sprays into both nostrils every 12 (twelve) hours.  [name] JARDIANCE 10 MG TABS tablet TAKE 1 TABLET DAILY  . levocetirizine (XYZAL) 5  MG tablet Take 5 mg by mouth every evening.  [name] levothyroxine (SYNTHROID) 50 MCG tablet TAKE 1 TABLET DAILY  . lidocaine (LIDODERM) 5 % Place 3 patches onto the skin daily. May use 1-3 patches at one time, Remove & Discard patch within 12 hours or as directed by MD (Patient not taking: Reported on 01/28/2019)  . losartan (COZAAR) 50 MG tablet Take 50 mg by mouth daily.   . Magnesium 250 MG TABS Take 1 tablet by mouth daily.  . methocarbamol (ROBAXIN) 750 MG tablet Take 750 mg by mouth 3 (three) times daily as needed for muscle spasms.  . mirabegron ER (MYRBETRIQ) 50 MG TB24 tablet Take 50 mg by mouth daily.  . montelukast (SINGULAIR) 10 MG tablet Take 10 mg by mouth at bedtime.  . Multiple Vitamin (MULTIVITAMIN) tablet Take 1 tablet by mouth daily.  . nitroGLYCERIN (NITROSTAT) 0.4 MG SL tablet Place 0.4 mg under the tongue every 5 (five) minutes as needed for chest pain.  . Omega-3 Fatty Acids (FISH OIL) 1000 MG CAPS Take 1,000 mg by mouth.  [name] OZEMPIC, 0.25 OR 0.5 MG/DOSE, 2 MG/1.5ML SOPN INJECT 0.5MG UNDER THE SKIN EVERY WEEK  . pantoprazole (PROTONIX) 40 MG tablet TAKE 1 TABLET DAILY  . pramipexole (MIRAPEX) 0.5 MG tablet Take 1 mg by mouth 3 (three) times daily.   . rivaroxaban (XARELTO) 10 MG TABS tablet Take 10 mg by mouth daily.  . solifenacin (VESICARE) 10 MG tablet Take 10 mg by mouth daily.  . vitamin B-12 (CYANOCOBALAMIN) 1000 MCG tablet Take 1,000 mcg by mouth daily.   No facility-administered encounter medications on file as of 10/21/2019.     Current Diagnosis/Assessment:  Goals Addressed   None    Hyperlipidemia    LDL goal < ***  Lipid Panel     Component Value Date/Time   CHOL 150 06/17/2018 1503   TRIG 185 (H) 06/17/2018 1503   HDL 43 06/17/2018 1503   LDLCALC 70 06/17/2018 1503    Hepatic Function Latest Ref Rng & Units 11/18/2018 03/17/2018  Total Protein 6.0 - 8.5 g/dL 6.8 6.6  Albumin 3.7 - 4.7 g/dL 4.1 4.0  AST 0 - 40 IU/L 20 20  ALT 0 - 44 IU/L 21 16  Alk Phosphatase 39 - 117 IU/L 70 63  Total Bilirubin 0.0 - 1.2 mg/dL 0.3 0.6     The 10-year ASCVD risk score Mikey Bussing DC Jr., et al., 2013) is: 30.6%   Values used to calculate the score:     Age: 74 years     Sex: Male     Is Non-Hispanic African American: No     Diabetic: Yes     Tobacco smoker: No     Systolic Blood Pressure: 638 mmHg     Is BP treated: Yes      HDL Cholesterol: 43 mg/dL     Total Cholesterol: 150 mg/dL   Patient has failed these meds in past: niacin Patient is currently {CHL Controlled/[phone]} on the following medications:  . Aspirin 81 mg daily . Atorvastatin 20 mg daily . Fish Oil/Omega-3 1000 mg by mouth ***  We discussed:  {[facility] discussion:[phone]}  Plan  Continue {[facility] Pharmacy [phone]}  COPD / Asthma / Tobacco   Last spirometry score: ***  Gold Grade: {[facility] COPD Gold GTXMI:[id number]} Current COPD Classification:  {[facility] COPD [phone]}  Eosinophil count:  No results found for: EOSPCT%  Eos (Absolute):  Lab Results  Component Value Date/Time   EOSABS 0.3 09/21/2018 11:57 AM    Tobacco Status:  Social History   Tobacco Use  Smoking Status Former Smoker  . Packs/day: 2.00  . Years: 4.00  . Pack years: 8.00  . Types: Cigarettes  . Quit date: 08/13/1965  . Years since quitting: 54.2  Smokeless Tobacco Never Used    Patient has failed these meds in past: QVAR 80 mcg inhaler Patient is currently {CHL Controlled/Uncontrolled:[phone]} on the following medications: albuterol hfa 2 puffs q6h prn wheezing/SOB, albuterol 0.083% q6h prn sob/wheezing, Symbicort 160-4.5 mcg/act 2 puffs bid, hydrocodone-homoatropine syrup 1 tsp q6h prn, ipratropium 0.03% nasal spray 2 sprays into both nostrils q12h, levoceterizine 5 mg daily, singulair 10 mg daily at bedtime Using maintenance inhaler regularly? {yes/no:20286} Frequency of rescue inhaler use:  {[facility] Inhaler SKAJ:[id number]}  We discussed:  {[facility] [phone]}  Plan  Continue {[facility] Pharmacy Plans:[id number]}   Diabetes   Recent Relevant Labs: Lab Results  Component Value Date/Time   HGBA1C 6.1 (H) 11/18/2018 10:57 AM   HGBA1C 6.2 (H) 06/17/2018 03:03 PM    MICROALBUR 30 07/30/2018 12:32 PM     Checking BG: {CHL HP Blood Glucose Monitoring Frequency:[phone]}  Recent FBG Readings: Recent pre-meal BG readings: *** Recent 2hr PP BG readings:  *** Recent HS BG readings: *** Patient has failed these meds in past: farxiga, janumet Patient is currently {CHL Controlled/Uncontrolled:[phone]} on the following medications: Jardiance 10 mg daily, Ozempic 0.5 mg under the skin every week, freestyle monitoring kit  Last diabetic Foot exam:  Lab Results  Component Value Date/Time   HMDIABEYEEXA No Retinopathy 04/26/2019 12:00 AM    Last diabetic Eye exam: No results found for: HMDIABFOOTEX   We discussed: {[facility] [phone]}  Plan  Continue {[facility] Pharmacy Plans:[id number]},   Hypertension   BP today is:  {[facility] UPSTREAM Pharmacist BP [phone]}  Office blood pressures are  BP Readings from Last 3 Encounters:  01/12/19 102/60  12/30/18 (!) 142/80  11/18/18 132/76    Patient has failed these meds in the past: lisinopril  Patient is currently controlled/uncontrolled*** on the following medications: carvedilol 6.25 mg bid, furosemide 20 mg daily prn, losartan 50 mg daily  Patient checks BP at home {CHL HP BP Monitoring Frequency:[phone]}  Patient home BP readings are ranging: ***  We discussed {[facility] [phone]}  Plan  Continue {[facility] Pharmacy Plans:[id number]}   Hypothyroidism   Lab Results  Component Value Date/Time   TSH 1.220 12/30/2018 05:21 PM    Patient has failed these meds in past: *** Patient is currently {CHL Controlled/Uncontrolled:[phone]} on the following medications:  . Levothyroxine 50 mcg daily  We discussed:  {[facility] [phone]}  Plan  Continue {[facility] Pharmacy Plans:[id number]}   AFIB   Patient is currently {CHL HP BP RATE/[phone]} controlled. HR ***  BPM  Patient has failed these meds in past: *** . Patient is currently {CHL Controlled/Uncontrolled:[phone]} on the following medications: Amiodarone 100 mg daily, Xarelto 10 mg daily  We discussed:  {[facility] [phone]}  Plan  Continue {[facility] Pharmacy Plans:[id number]}   ***RLS   Patient has failed these meds in past: *** Patient is currently {CHL Controlled/Uncontrolled:[phone]} on the following medications:  . Pramipexole 0.5 mg tid . Gabapentin 600 mg bid  We discussed:  ***  Plan  Continue {[facility] BTDHR:[id number]}  and  Other Diagnosis:Pain in both lower extremeties    Patient has failed these meds in past: *** Patient is currently {CHL Controlled/Uncontrolled:[phone]} on the following medications: Hydrocodone-apap 5-325 mg q6h prn moderate pain, methocarbamol 750 mg tid prn muscle spasms, lidocaine 5% patches - place 1-3 patches onto skin daily.   We discussed:  {[facility] [phone]}  Plan  Continue {[facility] Pharmacy Plans:[phone]}   ***neuropathy???***   Patient has failed these meds in past: *** Patient is currently {CHL Controlled/Uncontrolled:[phone]} on the following medications:  . Duloxetine 30 mg daily in the evening  We discussed:  ***  Plan  Continue {[facility] Pharmacy Plans:[phone]}  ***GERD***   Patient has failed these meds in past: *** Patient is currently {CHL Controlled/Uncontrolled:[phone]} on the following medications:  . Pantoprazole 40 mg daily  We discussed:  ***  Plan  Continue {[facility] Pharmacy Plans:[phone]}   ***OAB/Stress Incontinence   Patient has failed these meds in past: *** Patient is currently {CHL Controlled/Uncontrolled:[phone]} on the following medications:  [name] Myrbetriq 50 mg daily . Vesicare 10 mg daily   We discussed:  ***  Plan  Continue {[facility]  Plans:[phone]}    Health Maintenance   Patient is currently {CHL Controlled/Uncontrolled:[phone]} on the following medications:  . Nitroglycerin 0.4 mg prn chest pain . Vitamin b-12 1000 mcg daily . B Complex vitamins daily  . Vitamin D 2000 units daily . Magnesium 250 mg daily . Multivitamin daily  We discussed:  ***  Plan  Continue {[facility] CCQFJ:[id number]}  Vaccines   Reviewed and discussed patient's vaccination history.    Immunization History  Administered Date(s) Administered  . Influenza, High Dose Seasonal PF 12/30/2018  . Moderna SARS-COVID-2 Vaccination 06/30/2019  . Pneumococcal Polysaccharide-23 08/31/2014  . Tdap 11/03/2015    Plan  Recommended patient receive *** vaccine in *** office.   Medication Management   Pt uses [facility] for all medications Uses pill box? {Yes or If no, why not?:20788} Pt endorses ***% compliance  We discussed: ***  Plan  {US Pharmacy WVXU:27670}    Follow up: *** month phone visit  ***

## 2019-10-21 NOTE — Chronic Care Management (AMB) (Deleted)
Chronic Care Management Pharmacy  Name: Franklin Thornton  MRN: [id number] DOB: July 02, 1945  Chief Complaint/ HPI  Franklin Thornton,  74 y.o. , male presents for their Initial CCM visit with the clinical pharmacist {CHL HP Upstream Pharm visit TIWP:[id number]}.  PCP : [name], MD  Their chronic conditions include: HTN, PVD, Atherosclerosis of native coronary artery of native heart without angina pectoris, HLD, Obstructive sleep apnea, Type 2 DM, OAB, Malignant neoplasm of prostate, ED, Memory loss, RLS.   Office Visits:*** 12/30/2018 - cellulitis - keflex tid. Encouraged walking. Refer for f/u thyroid u/s and TSH drawn.   Consult Visit:*** 10/20/2019 - Otolarynology - raspy voice x1 year. Ordered clotrimazole troche qid x10d.  08/24/2019 - RLS neuro visit - gabapentin/mirapex continued.  07/27/2019 - ED visit for Cellulitis or right leg. Keflex 500 mg qid x7d started. Lactinex tid with meals.  06/29/2019 - RLS Neuro visit - decrease mirapex dosing. Increase gabapentin 600 mg am and 1200 mg qpm.  06/03/2019 - Echo with Cardiology - Left Ventricle: The calculated left ventricular ejection fraction is   63%. There is mild hypertrophy with basal septal accentuation. Systolic function is normal. EF: 60-65%. Wall motion is within normal limits.   04/26/2019 - Cardiology - chest pain likely musculoskeletal recommend NSAID/Tylenol otc. Continue current bp/cholesterol therapy. Use lasix to help with lower extremity edema. Neuropathy in legs - needs to see PCP to address.  03/24/2019 - Use lasix a few times a week and avoid salty food. Recommend mediterranean diet and exercise.  CCM Visits: *** 08/25/2019 - scheduled to meet with pharmacist.  Medications: Outpatient Encounter Medications as of 10/21/2019  Medication Sig  . albuterol (PROVENTIL HFA;VENTOLIN HFA) 108 (90 Base) MCG/ACT inhaler Inhale 2 puffs into the lungs every 6 (six) hours as needed for wheezing or shortness of breath.  [name]  albuterol (PROVENTIL) (2.5 MG/3ML) 0.083% nebulizer solution Take 3 mLs (2.5 mg total) by nebulization every 6 (six) hours as needed for wheezing or shortness of breath.  [name] amiodarone (PACERONE) 100 MG tablet Take 100 mg by mouth daily.   [name] aspirin 81 MG tablet Take 81 mg by mouth daily.  [name] atorvastatin (LIPITOR) 20 MG tablet TAKE 1 TABLET DAILY  . b complex vitamins capsule Take 1 capsule by mouth daily.  . budesonide-formoterol (SYMBICORT) 160-4.5 MCG/ACT inhaler Inhale 2 puffs into the lungs 2 (two) times daily.  . carvedilol (COREG) 6.25 MG tablet Take 6.25 mg by mouth 2 (two) times daily with a meal.  . Cholecalciferol (VITAMIN D) 2000 units tablet Take 2,000 Units by mouth daily.  . diphenhydrAMINE (BENADRYL) 25 MG tablet Take 25 mg by mouth every 6 (six) hours as needed for itching.  . DULoxetine (CYMBALTA) 30 MG capsule TAKE 1 CAPSULE DAILY IN THE EVENING  . furosemide (LASIX) 20 MG tablet Take 20 mg by mouth daily as needed.   . gabapentin (NEURONTIN) 600 MG tablet Take 600 mg by mouth 2 (two) times daily.  [name] glucose monitoring kit (FREESTYLE) monitoring kit 1 each by Does not apply route as needed for other.  . HYDROcodone-acetaminophen (NORCO/VICODIN) 5-325 MG tablet Take 1 tablet by mouth every 6 (six) hours as needed for moderate pain.  [name] HYDROcodone-homatropine (HYDROMET) 5-1.5 MG/5ML syrup Take 5 mLs by mouth every 6 (six) hours as needed.  [name] ipratropium (ATROVENT) 0.03 % nasal spray Place 2 sprays into both nostrils every 12 (twelve) hours.  [name] JARDIANCE 10 MG TABS tablet TAKE 1 TABLET DAILY  . levocetirizine (XYZAL) 5  MG tablet Take 5 mg by mouth every evening.  [name] levothyroxine (SYNTHROID) 50 MCG tablet TAKE 1 TABLET DAILY  . lidocaine (LIDODERM) 5 % Place 3 patches onto the skin daily. May use 1-3 patches at one time, Remove & Discard patch within 12 hours or as directed by MD (Patient not taking: Reported on 01/28/2019)  . losartan (COZAAR) 50 MG tablet Take 50 mg by mouth daily.   . Magnesium 250 MG TABS Take 1 tablet by mouth daily.  . methocarbamol (ROBAXIN) 750 MG tablet Take 750 mg by mouth 3 (three) times daily as needed for muscle spasms.  . mirabegron ER (MYRBETRIQ) 50 MG TB24 tablet Take 50 mg by mouth daily.  . montelukast (SINGULAIR) 10 MG tablet Take 10 mg by mouth at bedtime.  . Multiple Vitamin (MULTIVITAMIN) tablet Take 1 tablet by mouth daily.  . nitroGLYCERIN (NITROSTAT) 0.4 MG SL tablet Place 0.4 mg under the tongue every 5 (five) minutes as needed for chest pain.  . Omega-3 Fatty Acids (FISH OIL) 1000 MG CAPS Take 1,000 mg by mouth.  [name] OZEMPIC, 0.25 OR 0.5 MG/DOSE, 2 MG/1.5ML SOPN INJECT 0.5MG UNDER THE SKIN EVERY WEEK  . pantoprazole (PROTONIX) 40 MG tablet TAKE 1 TABLET DAILY  . pramipexole (MIRAPEX) 0.5 MG tablet Take 1 mg by mouth 3 (three) times daily.   . rivaroxaban (XARELTO) 10 MG TABS tablet Take 10 mg by mouth daily.  . solifenacin (VESICARE) 10 MG tablet Take 10 mg by mouth daily.  . vitamin B-12 (CYANOCOBALAMIN) 1000 MCG tablet Take 1,000 mcg by mouth daily.   No facility-administered encounter medications on file as of 10/21/2019.     Current Diagnosis/Assessment:  Goals Addressed   None    Hyperlipidemia    LDL goal < ***  Lipid Panel     Component Value Date/Time   CHOL 150 06/17/2018 1503   TRIG 185 (H) 06/17/2018 1503   HDL 43 06/17/2018 1503   LDLCALC 70 06/17/2018 1503    Hepatic Function Latest Ref Rng & Units 11/18/2018 03/17/2018  Total Protein 6.0 - 8.5 g/dL 6.8 6.6  Albumin 3.7 - 4.7 g/dL 4.1 4.0  AST 0 - 40 IU/L 20 20  ALT 0 - 44 IU/L 21 16  Alk Phosphatase 39 - 117 IU/L 70 63  Total Bilirubin 0.0 - 1.2 mg/dL 0.3 0.6     The 10-year ASCVD risk score Mikey Bussing DC Jr., et al., 2013) is: 30.6%   Values used to calculate the score:     Age: 81 years     Sex: Male     Is Non-Hispanic African American: No     Diabetic: Yes     Tobacco smoker: No     Systolic Blood Pressure: 638 mmHg     Is BP treated: Yes      HDL Cholesterol: 43 mg/dL     Total Cholesterol: 150 mg/dL   Patient has failed these meds in past: niacin Patient is currently {CHL Controlled/[phone]} on the following medications:  . Aspirin 81 mg daily . Atorvastatin 20 mg daily . Fish Oil/Omega-3 1000 mg by mouth ***  We discussed:  {[facility]L HP Upstream Pharmacy discussion:[phone]}  Plan  Continue {CHL HP Upstream Pharmacy [phone]}  COPD / Asthma / Tobacco   Last spirometry score: ***  Gold Grade: {CHL HP Upstream Pharm COPD Gold GTXMI:[id number]} Current COPD Classification:  {CHL HP Upstream Pharm COPD [phone]}  Eosinophil count:  No results found for: EOSPCT%  Eos (Absolute):  Lab Results  Component Value Date/Time   EOSABS 0.3 09/21/2018 11:57 AM    Tobacco Status:  Social History   Tobacco Use  Smoking Status Former Smoker  . Packs/day: 2.00  . Years: 4.00  . Pack years: 8.00  . Types: Cigarettes  . Quit date: 08/13/1965  . Years since quitting: 54.2  Smokeless Tobacco Never Used    Patient has failed these meds in past: QVAR 80 mcg inhaler Patient is currently {CHL Controlled/Uncontrolled:[phone]} on the following medications: albuterol hfa 2 puffs q6h prn wheezing/SOB, albuterol 0.083% q6h prn sob/wheezing, Symbicort 160-4.5 mcg/act 2 puffs bid, hydrocodone-homoatropine syrup 1 tsp q6h prn, ipratropium 0.03% nasal spray 2 sprays into both nostrils q12h, levoceterizine 5 mg daily, singulair 10 mg daily at bedtime Using maintenance inhaler regularly? {yes/no:20286} Frequency of rescue inhaler use:  {CHL HP Upstream Pharm Inhaler SKAJ:[id number]}  We discussed:  {CHL HP Upstream Pharmacy [phone]}  Plan  Continue {CHL HP Upstream Pharmacy Plans:6693393148}   Diabetes   Recent Relevant Labs: Lab Results  Component Value Date/Time   HGBA1C 6.1 (H) 11/18/2018 10:57 AM   HGBA1C 6.2 (H) 06/17/2018 03:03 PM    MICROALBUR 30 07/30/2018 12:32 PM     Checking BG: {CHL HP Blood Glucose Monitoring Frequency:[phone]}  Recent FBG Readings: Recent pre-meal BG readings: *** Recent 2hr PP BG readings:  *** Recent HS BG readings: *** Patient has failed these meds in past: farxiga, janumet Patient is currently {CHL Controlled/Uncontrolled:[phone]} on the following medications: Jardiance 10 mg daily, Ozempic 0.5 mg under the skin every week, freestyle monitoring kit  Last diabetic Foot exam:  Lab Results  Component Value Date/Time   HMDIABEYEEXA No Retinopathy 04/26/2019 12:00 AM    Last diabetic Eye exam: No results found for: HMDIABFOOTEX   We discussed: {CHL HP Upstream Pharmacy [phone]}  Plan  Continue {CHL HP Upstream Pharmacy Plans:[id number]},   Hypertension   BP today is:  {CHL HP UPSTREAM Pharmacist BP [phone]}  Office blood pressures are  BP Readings from Last 3 Encounters:  01/12/19 102/60  12/30/18 (!) 142/80  11/18/18 132/76    Patient has failed these meds in the past: lisinopril  Patient is currently controlled/uncontrolled*** on the following medications: carvedilol 6.25 mg bid, furosemide 20 mg daily prn, losartan 50 mg daily  Patient checks BP at home {CHL HP BP Monitoring Frequency:[phone]}  Patient home BP readings are ranging: ***  We discussed {CHL HP Upstream Pharmacy [phone]}  Plan  Continue {CHL HP Upstream Pharmacy Plans:6693393148}   Hypothyroidism   Lab Results  Component Value Date/Time   TSH 1.220 12/30/2018 05:21 PM    Patient has failed these meds in past: *** Patient is currently {CHL Controlled/Uncontrolled:[phone]} on the following medications:  . Levothyroxine 50 mcg daily  We discussed:  {CHL HP Upstream Pharmacy [phone]}  Plan  Continue {CHL HP Upstream Pharmacy Plans:6693393148}   AFIB   Patient is currently {CHL HP BP RATE/[phone]} controlled. HR ***  BPM  Patient has failed these meds in past: *** . Patient is currently {CHL Controlled/Uncontrolled:[phone]} on the following medications: Amiodarone 100 mg daily, Xarelto 10 mg daily  We discussed:  {CHL HP Upstream Pharmacy [phone]}  Plan  Continue {CHL HP Upstream Pharmacy Plans:6693393148}   ***RLS   Patient has failed these meds in past: *** Patient is currently {CHL Controlled/Uncontrolled:[phone]} on the following medications:  . Pramipexole 0.5 mg tid . Gabapentin 600 mg bid  We discussed:  ***  Plan  Continue {CHL HP Upstream Pharmacy BTDHR:4163845364}  and  Other Diagnosis:Pain in both lower extremeties    Patient has failed these meds in past: *** Patient is currently {CHL Controlled/Uncontrolled:[phone]} on the following medications: Hydrocodone-apap 5-325 mg q6h prn moderate pain, methocarbamol 750 mg tid prn muscle spasms, lidocaine 5% patches - place 1-3 patches onto skin daily.   We discussed:  {CHL HP Upstream Pharmacy [phone]}  Plan  Continue {CHL HP Upstream Pharmacy Plans:[phone]}   ***neuropathy???***   Patient has failed these meds in past: *** Patient is currently {CHL Controlled/Uncontrolled:[phone]} on the following medications:  . Duloxetine 30 mg daily in the evening  We discussed:  ***  Plan  Continue {CHL HP Upstream Pharmacy Plans:[phone]}  ***GERD***   Patient has failed these meds in past: *** Patient is currently {CHL Controlled/Uncontrolled:[phone]} on the following medications:  . Pantoprazole 40 mg daily  We discussed:  ***  Plan  Continue {CHL HP Upstream Pharmacy Plans:[phone]}   ***OAB/Stress Incontinence   Patient has failed these meds in past: *** Patient is currently {CHL Controlled/Uncontrolled:[phone]} on the following medications:  [name] Myrbetriq 50 mg daily . Vesicare 10 mg daily   We discussed:  ***  Plan  Continue {CHL HP Upstream Pharmacy  Plans:[phone]}    Health Maintenance   Patient is currently {CHL Controlled/Uncontrolled:[phone]} on the following medications:  . Nitroglycerin 0.4 mg prn chest pain . Vitamin b-12 1000 mcg daily . B Complex vitamins daily  . Vitamin D 2000 units daily . Magnesium 250 mg daily . Multivitamin daily  We discussed:  ***  Plan  Continue {CHL HP Upstream Pharmacy CCQFJ:[id number]}  Vaccines   Reviewed and discussed patient's vaccination history.    Immunization History  Administered Date(s) Administered  . Influenza, High Dose Seasonal PF 12/30/2018  . Moderna SARS-COVID-2 Vaccination 06/30/2019  . Pneumococcal Polysaccharide-23 08/31/2014  . Tdap 11/03/2015    Plan  Recommended patient receive *** vaccine in *** office.   Medication Management   Pt uses [facility] for all medications Uses pill box? {Yes or If no, why not?:20788} Pt endorses ***% compliance  We discussed: ***  Plan  {US Pharmacy WVXU:27670}    Follow up: *** month phone visit  ***

## 2019-10-22 ENCOUNTER — Telehealth: Payer: Self-pay | Admitting: Internal Medicine

## 2019-10-22 NOTE — Chronic Care Management (AMB) (Signed)
  Chronic Care Management   Note  10/22/2019 Name: Franklin Thornton MRN: [id number] DOB: [birth year]  Franklin Thornton is a 74 y.o. year old male who is a primary care patient of [name], MD and is actively engaged with the care management team. I reached out to [name] by phone today to assist with re-scheduling an initial visit with the Pharmacist  Follow up plan: Telephone appointment with care management team member scheduled for: 11/18/2019.  [place], [place] [address] Direct Dial: [phone] [email or web address] Website: [email or web address]

## 2019-10-25 ENCOUNTER — Other Ambulatory Visit: Payer: Self-pay | Admitting: Internal Medicine

## 2019-10-27 DIAGNOSIS — I2510 Atherosclerotic heart disease of native coronary artery without angina pectoris: Secondary | ICD-10-CM | POA: Diagnosis not present

## 2019-10-27 DIAGNOSIS — I10 Essential (primary) hypertension: Secondary | ICD-10-CM | POA: Diagnosis not present

## 2019-10-27 DIAGNOSIS — R0600 Dyspnea, unspecified: Secondary | ICD-10-CM | POA: Diagnosis not present

## 2019-10-27 DIAGNOSIS — I739 Peripheral vascular disease, unspecified: Secondary | ICD-10-CM | POA: Diagnosis not present

## 2019-10-27 DIAGNOSIS — I480 Paroxysmal atrial fibrillation: Secondary | ICD-10-CM | POA: Diagnosis not present

## 2019-11-16 DIAGNOSIS — Z7982 Long term (current) use of aspirin: Secondary | ICD-10-CM | POA: Diagnosis not present

## 2019-11-16 DIAGNOSIS — M7989 Other specified soft tissue disorders: Secondary | ICD-10-CM | POA: Diagnosis not present

## 2019-11-16 DIAGNOSIS — S81811A Laceration without foreign body, right lower leg, initial encounter: Secondary | ICD-10-CM | POA: Diagnosis not present

## 2019-11-16 DIAGNOSIS — E119 Type 2 diabetes mellitus without complications: Secondary | ICD-10-CM | POA: Diagnosis not present

## 2019-11-16 DIAGNOSIS — I4891 Unspecified atrial fibrillation: Secondary | ICD-10-CM | POA: Diagnosis not present

## 2019-11-16 DIAGNOSIS — R2241 Localized swelling, mass and lump, right lower limb: Secondary | ICD-10-CM | POA: Diagnosis not present

## 2019-11-16 DIAGNOSIS — E785 Hyperlipidemia, unspecified: Secondary | ICD-10-CM | POA: Diagnosis not present

## 2019-11-16 DIAGNOSIS — L03115 Cellulitis of right lower limb: Secondary | ICD-10-CM | POA: Diagnosis not present

## 2019-11-16 DIAGNOSIS — I10 Essential (primary) hypertension: Secondary | ICD-10-CM | POA: Diagnosis not present

## 2019-11-16 DIAGNOSIS — Z79899 Other long term (current) drug therapy: Secondary | ICD-10-CM | POA: Diagnosis not present

## 2019-11-17 ENCOUNTER — Telehealth: Payer: Self-pay

## 2019-11-17 NOTE — Chronic Care Management (AMB) (Signed)
Have you seen any other providers since your last visit? **yes- Patient was see in the ED 11/16/2019 Any changes in your medications or health? Yes- Patient diagnosed with Cellulitis of the legs, given Cephalexin 500mg  to take one tablet four times a day for 10 days and Septra 800/160mg  take one tablet twice a day for 10 days with food.  Any side effects from any medications? Yes- Patient feels like some of his medications may cause constipation and he has trouble staying awake during the day.  Do you have an symptoms or problems not managed by your medications? no Any concerns about your health right now? Yes- patient is concerned about constipation issues and was concerned about leg cramps but that stopped when he started taking otc potassium 99mg .  Has your provider asked that you check blood pressure, blood sugar, or follow special diet at home? Yes-  Do you get any type of exercise on a regular basis? Yes a little bit, patient states he walks around the pool table a lot.  Can you think of a goal you would like to reach for your health? Patient would like to start exercising more and lose some weight.  Do you have any problems getting your medications? No- Patient uses [facility] for mail order and [facility]. Is there anything that you would like to discuss during the appointment? Yes patient would like to discuss weaning off of some of his medications.  Please bring medications and supplements to appointment- Patient aware, he has a telephone visit with Jannette Fogo, CPP 11/18/2019.  Patient is currently taking: Carvedilol 6.25mg , 1 tab twice a day- Dr Gerarda Gunther Levothyroxine 47mcg, 1 tablet daily- Dr Baird Cancer Atorvastatin 20mg , 1 tablet daily- Dr Barbee Shropshire 5mg , 1 tablet daily- Dr Estill Dooms Atrovent Nasal Spray- 2 sprays each nostril as needed ASA 81mg , 1 tablet daily Furosemide 20mg , 1 tablet daily- Dr Gerarda Gunther Duloxetine 30mg , 1 tablet in the evening- Dr Mignon Pine 10mg - 1  tablet daily- Dr Earlean Shawl 200mg , 1 capsule twice a day as needed- Dr Alcide Clever Amiodarone 100mg , 1 tablet daily- Dr Gerarda Gunther Montelukast 10mg , 1 table daily- Dr Alcide Clever Gabapentin 600mg - 2 tablets daily with supper (patient takes 1 in the morning and 1 at night instead)- Dr Harrie Foreman Mirapex 0.5mg , 1 tablet three times a day- Dr Alcide Clever Pantoprazole 40mg , 1 tablet daily- Janece Moore Myrbetriq 50mg , 1 tablet daily- Dr Estill Dooms Xarelto 10mg , 1 tablet daily- Dr Gerarda Gunther Methocarbamol 750mg , take 1 twice a day as needed, #15DS left uses as needed. Hydrocodone 5/325mg , 1 tablet daily- Dr Posey Pronto ([place])  Patient has meds he is not sure if he should be taking: Xyzal 5mg  , Lamictal and Melatonin 5mg   Patient uses OTC- Vitamin D3 2000unites, Potassium 99mg , Fish Oil w/Vitamin E 1000mg , B complex and Centrum Silver.  Patient has 2 inhalers: ProAir, 1 puff as needed- not using but still good and Symbicort, 2 puffs daily, not using but inhaler has expired.   Pattricia Boss, CPA

## 2019-11-18 ENCOUNTER — Other Ambulatory Visit: Payer: Self-pay

## 2019-11-18 ENCOUNTER — Ambulatory Visit: Payer: Medicare Other

## 2019-11-18 DIAGNOSIS — R350 Frequency of micturition: Secondary | ICD-10-CM

## 2019-11-18 DIAGNOSIS — E1122 Type 2 diabetes mellitus with diabetic chronic kidney disease: Secondary | ICD-10-CM

## 2019-11-18 DIAGNOSIS — I1310 Hypertensive heart and chronic kidney disease without heart failure, with stage 1 through stage 4 chronic kidney disease, or unspecified chronic kidney disease: Secondary | ICD-10-CM

## 2019-11-18 NOTE — Chronic Care Management (AMB) (Signed)
Chronic Care Management Pharmacy  Name: Franklin Thornton  MRN: [id number] DOB: 1945-06-23  Chief Complaint/ HPI  Franklin Thornton,  74 y.o. , male presents for their Initial CCM visit with the clinical pharmacist via telephone due to COVID-19 Pandemic.  PCP : [name], MD  Their chronic conditions include: HTN, PVD, Atherosclerosis of native coronary artery of native heart without angina pectoris, HLD, Obstructive sleep apnea, Type 2 DM, OAB, Malignant neoplasm of prostate, ED, Memory loss, RLS.   Office Visits: 12/30/18: Present for f/u of restless leg syndrome. Has been seen by Neurology. Erythema and warmth present bilateral LE. Given Keflex tid for cellulitis. Encouraged walking. Refer for f/u thyroid u/s and TSH drawn.  Thyroid function within normal limits.   Consult Visits: 11/16/19 ED: Presented with right leg redness and swelling after injury 1.5 weeks ago. No evidence of DVT found on exam. No fracture of dislocation identified. Found to have cellulitis. Started of Keflex QID for 10 days and Bactrim DS BID for 10 days. Referred to wound clinic.   10/27/19 Cardiology OV w/ Dr. Gerarda Gunther: Dyspnea on exertion likely due to diastolic dysfunction and uncontrolled HTN. Increase losartan to twice daily for better blood pressure control. Instructed to check BP at home for 2 weeks and notify provider. Discussed low fat mediterranean diet and exercise for weight control.   10/20/2019 Otolaryngology - raspy voice x1 year. Ordered clotrimazole troche qid x10d.   08/24/2019 RLS neuro visit - gabapentin/mirapex continued.   07/27/2019 ED visit for Cellulitis or right leg. Keflex 500 mg qid x7d started. Lactinex tid with meals.   06/29/2019 RLS Neuro visit - decrease mirapex dosing. Increase gabapentin 600 mg am and 1200 mg qpm.   06/03/2019 Echo with Cardiology - Left Ventricle: The calculated left ventricular ejection fraction is 63%. There is mild hypertrophy with basal septal accentuation. Systolic  function is normal. EF: 60-65%. Wall motion is within normal limits.    04/26/2019 Cardiology - chest pain likely musculoskeletal recommend NSAID/Tylenol otc. Continue current bp/cholesterol therapy. Use lasix to help with lower extremity edema. Neuropathy in legs - needs to see PCP to address.   03/24/2019 Cardiology- Use lasix a few times a week and avoid salty food. Recommend mediterranean diet and exercise.   Medications: Outpatient Encounter Medications as of 11/18/2019  Medication Sig  . albuterol (PROVENTIL HFA;VENTOLIN HFA) 108 (90 Base) MCG/ACT inhaler Inhale 2 puffs into the lungs every 6 (six) hours as needed for wheezing or shortness of breath.  [name] albuterol (PROVENTIL) (2.5 MG/3ML) 0.083% nebulizer solution Take 3 mLs (2.5 mg total) by nebulization every 6 (six) hours as needed for wheezing or shortness of breath.  [name] amiodarone (PACERONE) 100 MG tablet Take 100 mg by mouth daily.   [name] aspirin 81 MG tablet Take 81 mg by mouth daily.  [name] atorvastatin (LIPITOR) 20 MG tablet TAKE 1 TABLET DAILY  . b complex vitamins capsule Take 1 capsule by mouth daily.  . carvedilol (COREG) 6.25 MG tablet Take 6.25 mg by mouth 2 (two) times daily with a meal.  . Cholecalciferol (VITAMIN D) 2000 units tablet Take 2,000 Units by mouth daily.  . DULoxetine (CYMBALTA) 30 MG capsule TAKE 1 CAPSULE DAILY IN THE EVENING  . furosemide (LASIX) 20 MG tablet Take 20 mg by mouth daily as needed.   . gabapentin (NEURONTIN) 600 MG tablet Take 600 mg by mouth 2 (two) times daily.  [name] glucose monitoring kit (FREESTYLE) monitoring kit 1 each by Does not apply route as  needed for other.  . HYDROcodone-acetaminophen (NORCO/VICODIN) 5-325 MG tablet Take 1 tablet by mouth every 6 (six) hours as needed for moderate pain.  [name] ipratropium (ATROVENT) 0.03 % nasal spray Place 2 sprays into both nostrils every 12 (twelve) hours.  [name] JARDIANCE 10 MG TABS tablet TAKE 1 TABLET DAILY  . levothyroxine (SYNTHROID) 50 MCG tablet TAKE 1  TABLET DAILY  . losartan (COZAAR) 50 MG tablet Take 50 mg by mouth 2 (two) times daily.   . Magnesium 250 MG TABS Take 1 tablet by mouth daily.  . methocarbamol (ROBAXIN) 750 MG tablet Take 750 mg by mouth 3 (three) times daily as needed for muscle spasms. (Patient not taking: Reported on 12/13/2019)  . mirabegron ER (MYRBETRIQ) 50 MG TB24 tablet Take 50 mg by mouth daily.  . montelukast (SINGULAIR) 10 MG tablet Take 10 mg by mouth at bedtime.  . Multiple Vitamin (MULTIVITAMIN) tablet Take 1 tablet by mouth daily.  . Omega-3 Fatty Acids (FISH OIL) 1200 MG CAPS Take 1,200 mg by mouth.   [name] OZEMPIC, 0.25 OR 0.5 MG/DOSE, 2 MG/1.5ML SOPN INJECT 0.'5MG'$  UNDER THE SKIN EVERY WEEK  . pantoprazole (PROTONIX) 40 MG tablet TAKE 1 TABLET DAILY  . pramipexole (MIRAPEX) 0.5 MG tablet Take 1 mg by mouth in the morning and at bedtime.   . rivaroxaban (XARELTO) 10 MG TABS tablet Take 10 mg by mouth daily.  . solifenacin (VESICARE) 5 MG tablet Take 5 mg by mouth daily.   . vitamin B-12 (CYANOCOBALAMIN) 1000 MCG tablet Take 1,000 mcg by mouth daily.  . budesonide-formoterol (SYMBICORT) 160-4.5 MCG/ACT inhaler Inhale 2 puffs into the lungs 2 (two) times daily.   . diphenhydrAMINE (BENADRYL) 25 MG tablet Take 25 mg by mouth every 6 (six) hours as needed for itching.   [name] HYDROcodone-homatropine (HYDROMET) 5-1.5 MG/5ML syrup Take 5 mLs by mouth every 6 (six) hours as needed.  [name] levocetirizine (XYZAL) 5 MG tablet Take 5 mg by mouth every evening.   . lidocaine (LIDODERM) 5 % Place 3 patches onto the skin daily. May use 1-3 patches at one time, Remove & Discard patch within 12 hours or as directed by MD (Patient not taking: Reported on 01/28/2019)  . nitroGLYCERIN (NITROSTAT) 0.4 MG SL tablet Place 0.4 mg under the tongue every 5 (five) minutes as needed for chest pain.  (Patient not taking: Reported on 12/13/2019)   No facility-administered encounter medications on file as of 11/18/2019.   Current  Diagnosis/Assessment:  SDOH Interventions     Most Recent Value  SDOH Interventions  Financial Strain Interventions Intervention Not Indicated     Goals Addressed            This Visit's Progress   . Pharmacy Care Plan       CARE PLAN ENTRY (see longitudinal plan of care for additional care plan information)  Current Barriers:  . Chronic Disease Management support, education, and care coordination needs related to Hypertension, Hyperlipidemia, and Diabetes   Hypertension BP Readings from Last 3 Encounters:  12/14/19 110/60  11/24/19 120/75  01/12/19 102/60   . Pharmacist Clinical Goal(s): o Over the next 180 days, patient will work with PharmD and providers to maintain BP goal <130/80 . Current regimen:   Carvedilol 6.25 mg twice daily  Furosemide 20 mg daily prn  Losartan 50 mg twice daily . Interventions: o Provided dietary and exercise recommendations . Patient self care activities - Over the next 180 days, patient will: o Check BP periodically and if symptomatic  document, and provide at future appointments o Ensure daily salt intake < 2300 mg/day o Try to exercise for 30 minutes 5 times weekly  - Start slow with 1-2 days per week and work up to 5  Hyperlipidemia Lab Results  Component Value Date/Time   [facility] 83 12/14/2019 04:25 PM   . Pharmacist Clinical Goal(s): o Over the next 180 days, patient will work with PharmD and providers to maintain LDL goal < 70 . Current regimen:  . Atorvastatin 20 mg daily . Omega-3 Fatty acids 1200 mg by mouth daily . Interventions: o Provided dietary and exercise recommendations . Patient self care activities - Over the next 180 days, patient will: o Try to exercise for 30 minutes 5 times weekly  - Start slow with 1-2 days per week and work up to [place] for eating well-balanced meal  Diabetes Lab Results  Component Value Date/Time   HGBA1C 6.8 (H) 12/14/2019 04:25 PM   HGBA1C 6.1 (H) 11/18/2018  10:57 AM   . Pharmacist Clinical Goal(s): o Over the next 180 days, patient will work with PharmD and providers to maintain A1c goal <7% . Current regimen:   Jardiance 10 mg daily  Ozempic 0.5 mg under the skin every week . Interventions: o Provided dietary and exercise recommendations o Discussed signs and symptoms of hypoglycemia and how to treat . Patient self care activities - Over the next 180 days, patient will: o Check blood sugar periodically and if symptomatic, document, and provide at future appointments o Contact provider with any episodes of hypoglycemia o Try to exercise for 30 minutes 5 times weekly  - Start slow with 1-2 days per week and work up to 5  Medication management . Pharmacist Clinical Goal(s): o Over the next 180 days, patient will work with PharmD and providers to maintain optimal medication adherence . Current pharmacy: [facility] . Interventions o Comprehensive medication review performed. o Continue current medication management strategy . Patient self care activities - Over the next 180 days, patient will: o Focus on medication adherence by utilizing a pill box o Take medications as prescribed o Report any questions or concerns to PharmD and/or provider(s)  Initial goal documentation       Hyperlipidemia    LDL goal < 70  Lipid Panel     Component Value Date/Time   CHOL 166 12/14/2019 1625   TRIG 201 (H) 12/14/2019 1625   HDL 49 12/14/2019 1625   LDLCALC 83 12/14/2019 1625    Hepatic Function Latest Ref Rng & Units 12/14/2019 11/18/2018 03/17/2018  Total Protein 6.0 - 8.5 g/dL 6.7 6.8 6.6  Albumin 3.7 - 4.7 g/dL 3.9 4.1 4.0  AST 0 - 40 IU/L '22 20 20  '$ ALT 0 - 44 IU/L '20 21 16  '$ Alk Phosphatase 48 - 121 IU/L 66 70 63  Total Bilirubin 0.0 - 1.2 mg/dL 0.6 0.3 0.6    The 10-year ASCVD risk score Mikey Bussing DC Jr., et al., 2013) is: 37.1%   Values used to calculate the score:     Age: 45 years     Sex: Male     Is  Non-Hispanic African American: No     Diabetic: Yes     Tobacco smoker: No     Systolic Blood Pressure: 202 mmHg     Is BP treated: Yes     HDL Cholesterol: 49 mg/dL     Total Cholesterol: 166 mg/dL   Patient has failed these meds  in past: niacin Patient is currently controlled on the following medications:  . Aspirin 81 mg daily . Atorvastatin 20 mg daily . Omega-3 Fatty acids 1200 mg by mouth daily  We discussed:    Goal triglycerides <150  Ways to decrease triglycerides (diet and exercise) Diet extensively Diet is not very good Last week had biscuits and gravy 3 times Sandwiches and beans Eats vegetables (carrots, peas, etc) Fish (baked, broiled, and fried) Fruits Recommend PLATE method Minimize fried foods Exercise extensively Not much exercise right now, but he states he wants to Has exercise equipment in his house Recommend pt get 30 minutes of moderate intensity exercise daily 5 times a week (150 minutes total per week) Start out slow and work up to 5 days per week  Denies muscle pains with atorvastatin  Plan Continue current medications  Asthma   Last spirometry score: No spirometry  Eosinophil count:  No results found for: EOSPCT%                               Eos (Absolute):  Lab Results  Component Value Date/Time   EOSABS 0.3 09/21/2018 11:57 AM   Tobacco Status:  Social History   Tobacco Use  Smoking Status Former Smoker  . Packs/day: 2.00  . Years: 4.00  . Pack years: 8.00  . Types: Cigarettes  . Quit date: 08/13/1965  . Years since quitting: 54.3  Smokeless Tobacco Never Used   Patient has failed these meds in past: Qvar Patient is currently controlled on the following medications:   Albuterol HFA 2 puffs q6h prn wheezing/SOB  Albuterol 0.083% every 6 hours prn sob/wheezing  Ipratropium 0.03% nasal spray 2 sprays into both nostrils every 12 hours  Levocetirizine 5 mg daily  Singulair 10 mg daily at bedtime  Using maintenance  inhaler regularly? No Frequency of rescue inhaler use:  infrequently  We discussed:   Pt states that the inhaler is affordable  He does not report asthma symptoms very often  He uses the albuterol once every couple of months and it works well to control symptoms  Pt does report coughing a lot with a lot of mucus  Pt only uses ipratropium once every couple of months  Pt not using Symbicort  Plan Continue current medications  Diabetes   Recent Relevant Labs: Lab Results  Component Value Date/Time   HGBA1C 6.8 (H) 12/14/2019 04:25 PM   HGBA1C 6.1 (H) 11/18/2018 10:57 AM   MICROALBUR 30 07/30/2018 12:32 PM    Checking BG: Rarely  Recent FBG Readings:  Recent pre-meal BG readings:  Recent 2hr PP BG readings:   Recent HS BG readings:  Patient has failed these meds in past: Farxiga, [place] Patient is currently controlled on the following medications:   Jardiance 10 mg daily  Ozempic 0.5 mg under the skin every week  Last diabetic Foot exam: Not on file Last diabetic Eye exam:  Lab Results  Component Value Date/Time   HMDIABEYEEXA No Retinopathy 04/26/2019 12:00 AM    We discussed:   Pt states that Ozempic and Jardiance come through the mail and are affordable  Pt says BG is usually about 118, checked one time recently and it was 212  Recommend pt check BG if symptomatic  Feels comfortable with what to do if hypoglycemia occurs  Discussed symptoms and management Diet and exercise extensively  Plan Continue current medications   Hypertension   BP today is:  <130/80  Office blood pressures are  BP Readings from Last 3 Encounters:  12/14/19 110/60  11/24/19 120/75  01/12/19 102/60   Patient has failed these meds in the past: lisinopril  Patient is currently controlled on the following medications:   Carvedilol 6.25 mg twice daily  Furosemide 20 mg daily prn  Losartan 50 mg twice daily  Patient checks BP at home infrequently  Patient home BP  readings are ranging: 120/68  We discussed: . Diet and exercise extensively  Pt has had no problems since increasing losartan to twice daily  Plan Continue current medications   Hypothyroidism   Lab Results  Component Value Date/Time   TSH 1.220 12/30/2018 05:21 PM   Patient has failed these meds in past: N/A Patient is currently controlled on the following medications:  . Levothyroxine 50 mcg daily  We discussed:   Consistency is key with levothyroxine administration  Plan Continue current medications  AFIB   Patient is currently rhythm controlled. HR 64 BPM  Patient has failed these meds in past: N/A Patient is currently controlled on the following medications:  . Amiodarone 100 mg daily  . Xarelto 10 mg daily  We discussed:   Denies bleeding or bruising  Plan Continue current medications  Restless Legs Syndrome   Patient has failed these meds in past: N/A Patient is currently controlled on the following medications:  . Pramipexole 0.5 mg twice daily . Gabapentin 600 mg 1 tablet in the morning and 2 in the evening  Plan Continue current medications  Pain   Patient has failed these meds in past: N/A Patient is currently controlled on the following medications:   Hydrocodone-APAP 5-325 mg q6h prn moderate pain  Methocarbamol 750 mg tid prn muscle spasms  Lidocaine 5% patches - place 1-3 patches onto skin daily.   Plan Continue current medications   Neuropathy   Patient has failed these meds in past: N/A Patient is currently controlled on the following medications:  . Duloxetine 30 mg daily in the evening  Plan Continue current medications  GERD   Patient has failed these meds in past: N/A Patient is currently controlled on the following medications:  . Pantoprazole 40 mg daily  Plan Continue current medications   OAB/Stress Incontinence   Patient has failed these meds in past: N/A Patient is currently controlled on the following  medications:  [name] Myrbetriq 50 mg daily . Vesicare 5 mg daily   We discussed:    Per Urologist note, pt is supposed to be taking both Vesicare and Myrbetriq  Pt complained of constipation, but said he had this issue long before taking these medications  Plan Continue current medications   Constipation   Patient has failed these meds in past: N/A Patient is currently uncontrolled on the following medications:   Linzess 167mg daily  Docusate as needed  Senna as needed  We discussed:    Pt is not really taking Linzess (not taking daily)  Pt has had constipation for years  Pt drinks 4-5 16 ounce bottle s of water daily  Plan Continue current medications (taking consistently)  Health Maintenance   Patient is currently on the following medications:  . Nitroglycerin SL 0.4 mg prn chest pain . Vitamin B12 1000 mcg daily . B Complex vitamin daily  . Vitamin D 2000 units daily . Magnesium 250 mg daily . Multivitamin daily . Benadryl '25mg'$  every 6 hours as needed for itching . Vitamin C '500mg'$  daily . Ibuprofen as needed  Plan Continue current  medications  Discuss at follow up  Vaccines   Reviewed and discussed patient's vaccination history.    Immunization History  Administered Date(s) Administered  . H1N1 07/04/2008  . Influenza Split 02/20/2014  . Influenza, High Dose Seasonal PF 12/30/2018  . Influenza, Seasonal, Injecte, Preservative Fre 04/28/2015  . Influenza,inj,Quad PF,6+ Mos 05/04/2018  . Moderna SARS-COVID-2 Vaccination 06/30/2019, 07/21/2019  . Pneumococcal Conjugate-13 03/10/2014  . Pneumococcal Polysaccharide-23 08/31/2014  . Pneumococcal-Unspecified 04/10/2010, 08/31/2014  . Tdap 03/10/2014, 11/03/2015   Plan Review and discuss at follow up  Medication Management   Pt uses [facility] for all medications Uses pill box? No - Discuss at follow up Pt endorses 95% compliance  We discussed:  . Importance of taking  all medications as directed every day . No difficulty affording medications  Plan Continue current medication management strategy  Follow up: 3 month phone visit  Jannette Fogo, PharmD Clinical Pharmacist [facility] [phone]

## 2019-11-23 ENCOUNTER — Telehealth: Payer: Self-pay

## 2019-11-23 NOTE — Telephone Encounter (Signed)
The pt's appointment with Dr Baird Cancer was rescheduled because the pt was recently exposed to the coronavirus Saturday.  The pt said he feels fine, he has both vaccination shots. The pt's appt with Dr Baird Cancer was rescheduled and the appt with [facility] will be over the phone.

## 2019-11-24 ENCOUNTER — Ambulatory Visit: Payer: Medicare Other | Admitting: Internal Medicine

## 2019-11-24 ENCOUNTER — Ambulatory Visit (INDEPENDENT_AMBULATORY_CARE_PROVIDER_SITE_OTHER): Payer: Medicare Other

## 2019-11-24 VITALS — BP 120/75 | HR 61 | Ht 70.5 in | Wt 235.0 lb

## 2019-11-24 DIAGNOSIS — Z0000 Encounter for general adult medical examination without abnormal findings: Secondary | ICD-10-CM | POA: Diagnosis not present

## 2019-11-24 NOTE — Progress Notes (Signed)
I connected with Franklin Thornton today by telephone and verified that I am speaking with the correct person using two identifiers. Location patient: home Location provider: work Persons participating in the virtual visit: Franklin Thornton, Glenna Durand LPN.   I discussed the limitations, risks, security and privacy concerns of performing an evaluation and management service by telephone and the availability of in person appointments. I also discussed with the patient that there may be a patient responsible charge related to this service. The patient expressed understanding and verbally consented to this telephonic visit.    Interactive audio and video telecommunications were attempted between this provider and patient, however failed, due to patient having technical difficulties OR patient did not have access to video capability.  We continued and completed visit with audio only.  Vital signs may be patient reported or missing    Subjective:   Franklin Thornton is a 74 y.o. male who presents for Medicare Annual/Subsequent preventive examination.  Review of Systems     Cardiac Risk Factors include: advanced age (>62mn, >>33women);diabetes mellitus;dyslipidemia;hypertension;male gender;obesity (BMI >30kg/m2);sedentary lifestyle     Objective:    Today's Vitals   11/24/19 0854  BP: 120/75  Pulse: 61  Weight: 235 lb (106.6 kg)  Height: 5' 10.5" (1.791 m)   Body mass index is 33.24 kg/m.  Advanced Directives 11/24/2019 11/18/2018 08/28/2017 07/28/2017 08/14/2015  Does Patient Have a Medical Advance Directive? Yes Yes No No No  Type of [place]amed[place]? No - copy requested No - copy requested - - -  Would patient like information on creating a medical advance directive? - - - Yes (MAU/Ambulatory/Procedural Areas - Information given) No - patient declined information     Current Medications (verified) Outpatient Encounter Medications as of 11/24/2019  Medication Sig   albuterol (PROVENTIL HFA;VENTOLIN HFA) 108 (90 Base) MCG/ACT inhaler Inhale 2 puffs into the lungs every 6 (six) hours as needed for wheezing or shortness of breath.   albuterol (PROVENTIL) (2.5 MG/3ML) 0.083% nebulizer solution Take 3 mLs (2.5 mg total) by nebulization every 6 (six) hours as needed for wheezing or shortness of breath.   amiodarone (PACERONE) 100 MG tablet Take 100 mg by mouth daily.    aspirin 81 MG tablet Take 81 mg by mouth daily.   atorvastatin (LIPITOR) 20 MG tablet TAKE 1 TABLET DAILY   b complex vitamins capsule Take 1 capsule by mouth daily.   carvedilol (COREG) 6.25 MG tablet Take 6.25 mg by mouth 2 (two) times daily with a meal.   Cholecalciferol (VITAMIN D) 2000 units tablet Take 2,000 Units by mouth daily.   DULoxetine (CYMBALTA) 30 MG capsule TAKE 1 CAPSULE DAILY IN THE EVENING   furosemide (LASIX) 20 MG tablet Take 20 mg by mouth daily as needed.    gabapentin (NEURONTIN) 600 MG tablet Take 600 mg by mouth 2 (two) times daily.   glucose monitoring kit (FREESTYLE) monitoring kit 1 each by Does not apply route as needed for other.   HYDROcodone-acetaminophen (NORCO/VICODIN) 5-325 MG tablet Take 1 tablet by mouth every 6 (six) hours as needed for moderate pain.   ipratropium (ATROVENT) 0.03 % nasal spray Place 2 sprays into both nostrils every 12 (twelve) hours.   JARDIANCE 10 MG TABS tablet TAKE 1 TABLET DAILY   levothyroxine (SYNTHROID) 50 MCG tablet TAKE 1 TABLET DAILY   losartan (COZAAR) 50 MG tablet Take  50 mg by mouth 2 (two) times daily.    Magnesium 250 MG TABS Take 1 tablet by mouth daily.   methocarbamol (ROBAXIN) 750 MG tablet Take 750 mg by mouth 3 (three) times daily as needed for muscle spasms.   mirabegron ER (MYRBETRIQ) 50 MG TB24 tablet Take 50 mg by mouth daily.   montelukast (SINGULAIR) 10 MG tablet Take 10 mg by mouth at bedtime.   Multiple  Vitamin (MULTIVITAMIN) tablet Take 1 tablet by mouth daily.   nitroGLYCERIN (NITROSTAT) 0.4 MG SL tablet Place 0.4 mg under the tongue every 5 (five) minutes as needed for chest pain.    Omega-3 Fatty Acids (FISH OIL) 1200 MG CAPS Take 1,200 mg by mouth.    OZEMPIC, 0.25 OR 0.5 MG/DOSE, 2 MG/1.5ML SOPN INJECT 0.5MG UNDER THE SKIN EVERY WEEK   pantoprazole (PROTONIX) 40 MG tablet TAKE 1 TABLET DAILY   pramipexole (MIRAPEX) 0.5 MG tablet Take 1 mg by mouth in the morning and at bedtime.    rivaroxaban (XARELTO) 10 MG TABS tablet Take 10 mg by mouth daily.   solifenacin (VESICARE) 5 MG tablet Take 5 mg by mouth daily.    vitamin B-12 (CYANOCOBALAMIN) 1000 MCG tablet Take 1,000 mcg by mouth daily.   budesonide-formoterol (SYMBICORT) 160-4.5 MCG/ACT inhaler Inhale 2 puffs into the lungs 2 (two) times daily. (Patient not taking: Reported on 11/18/2019)   diphenhydrAMINE (BENADRYL) 25 MG tablet Take 25 mg by mouth every 6 (six) hours as needed for itching. (Patient not taking: Reported on 11/18/2019)   HYDROcodone-homatropine (HYDROMET) 5-1.5 MG/5ML syrup Take 5 mLs by mouth every 6 (six) hours as needed. (Patient not taking: Reported on 11/18/2019)   levocetirizine (XYZAL) 5 MG tablet Take 5 mg by mouth every evening. (Patient not taking: Reported on 11/18/2019)   lidocaine (LIDODERM) 5 % Place 3 patches onto the skin daily. May use 1-3 patches at one time, Remove & Discard patch within 12 hours or as directed by MD (Patient not taking: Reported on 01/28/2019)   No facility-administered encounter medications on file as of 11/24/2019.    Allergies (verified) No known allergies   History: Past Medical History:  Diagnosis Date   Atrial fibrillation ([place])    Diabetes mellitus without complication ([place])    Peripheral vascular disease ([place])    Prostate cancer ([place])    Uvular edema 07/17/2015   Past Surgical History:  Procedure Laterality Date   CORONARY ANGIOPLASTY WITH STENT PLACEMENT     x2    ENDOVENOUS ABLATION SAPHENOUS VEIN W/ LASER Left 08/19/2017   endovenous laser ablation left greater saphenous vein by Tinnie Gens MD    EXTERNAL EAR SURGERY  2020   right ear    UVULECTOMY  07/31/15   Family History  Problem Relation Age of Onset   Lung cancer Mother    Diabetes Mother    Esophageal cancer Father    Hypertension Sister    Breast cancer Sister    Healthy Son    Healthy Daughter    Social History   Socioeconomic History   Marital status: Married    Spouse name: Not on file   Number of children: Not on file   Years of education: Not on file   Highest education level: Not on file  Occupational History   Occupation: retired  Tobacco Use   Smoking status: Former Smoker    Packs/day: 2.00    Years: 4.00    Pack years: 8.00    Types: Cigarettes  Quit date: 08/13/1965    Years since quitting: 54.3   Smokeless tobacco: Never Used  Vaping Use   Vaping Use: Never used  Substance and Sexual Activity   Alcohol use: Yes    Alcohol/week: 1.0 standard drink    Types: 1 Standard drinks or equivalent per week    Comment: once a month   Drug use: Yes    Types: Hydrocodone   Sexual activity: Not Currently  Other Topics Concern   Not on file  Social History Narrative   Not on file   Social Determinants of Health   Financial Resource Strain: Low Risk    Difficulty of Paying Living Expenses: Not hard at all  Food Insecurity: No Food Insecurity   Worried About Programme researcher, broadcasting/film/video in the Last Year: Never true   Ran Out of Food in the Last Year: Never true  Transportation Needs: No Transportation Needs   Lack of Transportation (Medical): No   Lack of Transportation (Non-Medical): No  Physical Activity: Inactive   Days of Exercise per Week: 0 days   Minutes of Exercise per Session: 0 min  Stress: No Stress Concern Present   Feeling of Stress : Not at all  Social Connections:    Frequency of Communication with Friends and Family:    Frequency of Social  Gatherings with Friends and Family:    Attends Religious Services:    Active Member of Clubs or Organizations:    Attends Engineer, structural:    Marital Status:     Tobacco Counseling Counseling given: Not Answered   Clinical Intake:  Pre-visit preparation completed: Yes  Pain : No/denies pain     Nutritional Status: BMI > 30  Obese Nutritional Risks: None Diabetes: Yes  How often do you need to have someone help you when you read instructions, pamphlets, or other written materials from your doctor or pharmacy?: 1 - Never What is the last grade level you completed in school?: college  Diabetic? Yes Nutrition Risk Assessment:  Has the patient had any N/V/D within the last 2 months?  No  Does the patient have any non-healing wounds?  No  Has the patient had any unintentional weight loss or weight gain?  No   Diabetes:  Is the patient diabetic?  Yes  If diabetic, was a CBG obtained today?  No  Did the patient bring in their glucometer from home?  No  How often do you monitor your CBG's? When has symptoms.   Financial Strains and Diabetes Management:  Are you having any financial strains with the device, your supplies or your medication? No .  Does the patient want to be seen by Chronic Care Management for management of their diabetes?  No  Would the patient like to be referred to a Nutritionist or for Diabetic Management?  No   Diabetic Exams:  Diabetic Eye Exam: Completed 04/26/2019 Diabetic Foot Exam: Overdue, Pt has been advised about the importance in completing this exam. Pt is scheduled for diabetic foot exam on next appointment.   Interpreter Needed?: No  Information entered by :: NAllen LPN   Activities of Daily Living In your present state of health, do you have any difficulty performing the following activities: 11/24/2019  Hearing? Y  Comment has an appointment with audiology  Vision? N  Difficulty concentrating or making decisions? Y   Walking or climbing stairs? Y  Comment gets tired easily  Dressing or bathing? N  Doing errands, shopping? N  Preparing  Food and eating ? N  Using the Toilet? N  In the past six months, have you accidently leaked urine? Y  Comment takes medication  Do you have problems with loss of bowel control? N  Managing your Medications? N  Managing your Finances? N  Housekeeping or managing your Housekeeping? N  Some recent data might be hidden    Patient Care Team: [name], MD as PCP - General (Internal Medicine) Gardiner Rhyme, MD as Referring Physician (Specialist) Clifton Custard, MD as Referring Physician (Internal Medicine) Warden Fillers, MD as Consulting Physician (Ophthalmology) Adrian Prows, MD as Consulting Physician (Cardiology) Cyril Mourning, [facility] (Pharmacist)  Indicate any recent Medical Services you may have received from other than [facility] providers in the past year (date may be approximate).     Assessment:   This is a routine wellness examination for [place].  Hearing/Vision screen  Hearing Screening   _0  _1  _2  _3  _4  _5  _6  _7  _8   Right ear:           Left ear:           Vision Screening Comments: Regular eye exams, Dr. 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  He has not been using this twice daily as directed. Counseled patient to use this twice daily as prescribed. He states he has his albuterol inhaler and nebulizer for rescue.  Pharmacist Clinical Goal(s):  Over the next 90 days, patient with work with PharmD and primary care provider to address needs related to optimized medication management of chronic conditions  Interventions: Comprehensive medication review performed, medication list updated in electronic medical record Multiple changes to medications have been made since last PCP visit  Patient Self Care Activities:  Patient will check blood glucose daily , document, and provide at future appointments Patient will focus on medication adherence by continuing to take medications as prescribed. Patient will take medications as prescribed Patient will contact provider with any episodes of hypoglycemia Patient will report any questions or concerns to provider   Initial goal documentation       Patient Stated      11/24/2019, wants to start exercising and eating healthier      Weight (lb) < 200 lb (90.7 kg) (pt-stated)       Depression Screen PHQ 2/9 Scores 11/24/2019 12/30/2018 11/18/2018 06/17/2018 03/17/2018  PHQ - 2 Score 0 0 0 0 0  PHQ- 9 Score 6 - 7 - -    Fall Risk Fall Risk  11/24/2019 12/30/2018 11/18/2018 06/17/2018 03/17/2018  Falls in the past year? 0 0 0 0 0  Comment - - - - -  Risk for fall due to : Medication side effect - Medication side effect - -  Follow up Falls evaluation completed;Education provided;Falls prevention discussed - Falls evaluation completed;Education provided;Falls prevention discussed - -    Any stairs in  or around the home? No  If so, are there any without handrails? n/a Home free of loose throw rugs in walkways, pet beds, electrical cords, etc? Yes  Adequate lighting in your home to reduce risk of falls? Yes   ASSISTIVE DEVICES  UTILIZED TO PREVENT FALLS:  Life alert? No  Use of a cane, walker or w/c? No  Grab bars in the bathroom? No  Shower chair or bench in shower? No  Elevated toilet seat or a handicapped toilet? No   TIMED UP AND GO:  Was the test performed? No .  Cognitive Function:     6CIT Screen 11/24/2019 11/18/2018  What Year? 0 points 0 points  What month? 0 points 0 points  What time? 0 points 0 points  Count back from 20 0 points 0 points  Months in reverse 0 points 0 points  Repeat phrase 0 points 0 points  Total Score 0 0    Immunizations Immunization History  Administered Date(s) Administered   Influenza, High Dose Seasonal PF 12/30/2018   Moderna SARS-COVID-2 Vaccination 06/30/2019   Pneumococcal Polysaccharide-23 08/31/2014   Tdap 11/03/2015    TDAP status: Up to date Flu Vaccine status: Up to date Pneumococcal vaccine status: Up to date Covid-19 vaccine status: Completed vaccines  Qualifies for Shingles Vaccine? Yes   Zostavax completed No   Shingrix Completed?: No.    Education has been provided regarding the importance of this vaccine. Patient has been advised to call insurance company to determine out of pocket expense if they have not yet received this vaccine. Advised may also receive vaccine at local pharmacy or Health Dept. Verbalized acceptance and understanding.  Screening Tests Health Maintenance  Topic Date Due   HEMOGLOBIN A1C  05/21/2019   FOOT EXAM  06/18/2019   INFLUENZA VACCINE  11/21/2019   OPHTHALMOLOGY EXAM  04/25/2020   TETANUS/TDAP  11/02/2025   COLONOSCOPY  06/23/2029   COVID-19 Vaccine  Completed   Hepatitis C Screening  Completed   PNA vac Low Risk Adult  Completed    Health Maintenance  Health Maintenance Due  Topic Date Due   HEMOGLOBIN A1C  05/21/2019   FOOT EXAM  06/18/2019   INFLUENZA VACCINE  11/21/2019    Colorectal cancer screening: Completed 06/24/2019. Repeat every 5 years  Lung Cancer Screening: (Low Dose CT Chest  recommended if Age 80-80 years, 30 pack-year currently smoking OR have quit w/in 15years.) does not qualify.   Lung Cancer Screening Referral: no   Additional Screening:  Hepatitis C Screening: does qualify; Completed 07/22/2012  Vision Screening: Recommended annual ophthalmology exams for early detection of glaucoma and other disorders of the eye. Is the patient up to date with their annual eye exam?  Yes  Who is the provider or what is the name of the office in which the patient attends annual eye exams? Dr. Katy Fitch If pt is not established with a provider, would they like to be referred to a provider to establish care? No .   Dental Screening: Recommended annual dental exams for proper oral hygiene  Community Resource Referral / Chronic Care Management: CRR required this visit?  No   CCM required this visit?  No      Plan:     I have personally reviewed and noted the following in the patient's chart:   Medical and social history Use of alcohol, tobacco or illicit drugs  Current medications and supplements Functional ability and status Nutritional status Physical activity Advanced directives List of other  physicians Hospitalizations, surgeries, and ER visits in previous 12 months Vitals Screenings to include cognitive, depression, and falls Referrals and appointments  In addition, I have reviewed and discussed with patient certain preventive protocols, quality metrics, and best practice recommendations. A written personalized care plan for preventive services as well as general preventive health recommendations were provided to patient.     Kellie Simmering, LPN   05/31/6379   Nurse Notes:

## 2019-11-24 NOTE — Patient Instructions (Signed)
[name] , Thank you for taking time to come for your Medicare Wellness Visit. I appreciate your ongoing commitment to your health goals. Please review the following plan we discussed and let me know if I can assist you in the future.   Screening recommendations/referrals: Colonoscopy: completed 06/24/2019 Recommended yearly ophthalmology/optometry visit for glaucoma screening and checkup Recommended yearly dental visit for hygiene and checkup  Vaccinations: Influenza vaccine: due Pneumococcal vaccine: completed 08/31/2014 Tdap vaccine: completed 11/03/2015 Shingles vaccine: discussed   Covid-19: 07/21/2019, 06/30/2019  Advanced directives: Please bring a copy of your POA (Power of Attorney) and/or Living Will to your next appointment.   Conditions/risks identified: none  Next appointment: Follow up in one year for your annual wellness visit.   Preventive Care 75 Years and Older, Male Preventive care refers to lifestyle choices and visits with your health care provider that can promote health and wellness. What does preventive care include?  A yearly physical exam. This is also called an annual well check.  Dental exams once or twice a year.  Routine eye exams. Ask your health care provider how often you should have your eyes checked.  Personal lifestyle choices, including:  Daily care of your teeth and gums.  Regular physical activity.  Eating a healthy diet.  Avoiding tobacco and drug use.  Limiting alcohol use.  Practicing safe sex.  Taking low doses of aspirin every day.  Taking vitamin and mineral supplements as recommended by your health care provider. What happens during an annual well check? The services and screenings done by your health care provider during your annual well check will depend on your age, overall health, lifestyle risk factors, and family history of disease. Counseling  Your health care provider may ask you questions about your:  Alcohol  use.  Tobacco use.  Drug use.  Emotional well-being.  Home and relationship well-being.  Sexual activity.  Eating habits.  History of falls.  Memory and ability to understand (cognition).  Work and work Statistician. Screening  You may have the following tests or measurements:  Height, weight, and BMI.  Blood pressure.  Lipid and cholesterol levels. These may be checked every 5 years, or more frequently if you are over 40 years old.  Skin check.  Lung cancer screening. You may have this screening every year starting at age 16 if you have a 30-pack-year history of smoking and currently smoke or have quit within the past 15 years.  Fecal occult blood test (FOBT) of the stool. You may have this test every year starting at age 60.  Flexible sigmoidoscopy or colonoscopy. You may have a sigmoidoscopy every 5 years or a colonoscopy every 10 years starting at age 15.  Prostate cancer screening. Recommendations will vary depending on your family history and other risks.  Hepatitis C blood test.  Hepatitis B blood test.  Sexually transmitted disease (STD) testing.  Diabetes screening. This is done by checking your blood sugar (glucose) after you have not eaten for a while (fasting). You may have this done every 1-3 years.  Abdominal aortic aneurysm (AAA) screening. You may need this if you are a current or former smoker.  Osteoporosis. You may be screened starting at age 32 if you are at high risk. Talk with your health care provider about your test results, treatment options, and if necessary, the need for more tests. Vaccines  Your health care provider may recommend certain vaccines, such as:  Influenza vaccine. This is recommended every year.  Tetanus, diphtheria,  and acellular pertussis (Tdap, Td) vaccine. You may need a Td booster every 10 years.  Zoster vaccine. You may need this after age 34.  Pneumococcal 13-valent conjugate (PCV13) vaccine. One dose is  recommended after age 61.  Pneumococcal polysaccharide (PPSV23) vaccine. One dose is recommended after age 37. Talk to your health care provider about which screenings and vaccines you need and how often you need them. This information is not intended to replace advice given to you by your health care provider. Make sure you discuss any questions you have with your health care provider. Document Released: 05/05/2015 Document Revised: 12/27/2015 Document Reviewed: 02/07/2015 Elsevier Interactive Patient Education  2017 [place] Prevention in the Home Falls can cause injuries. They can happen to people of all ages. There are many things you can do to make your home safe and to help prevent falls. What can I do on the outside of my home?  Regularly fix the edges of walkways and driveways and fix any cracks.  Remove anything that might make you trip as you walk through a door, such as a raised step or threshold.  Trim any bushes or trees on the path to your home.  Use bright outdoor lighting.  Clear any walking paths of anything that might make someone trip, such as rocks or tools.  Regularly check to see if handrails are loose or broken. Make sure that both sides of any steps have handrails.  Any raised decks and porches should have guardrails on the edges.  Have any leaves, snow, or ice cleared regularly.  Use sand or salt on walking paths during winter.  Clean up any spills in your garage right away. This includes oil or grease spills. What can I do in the bathroom?  Use night lights.  Install grab bars by the toilet and in the tub and shower. Do not use towel bars as grab bars.  Use non-skid mats or decals in the tub or shower.  If you need to sit down in the shower, use a plastic, non-slip stool.  Keep the floor dry. Clean up any water that spills on the floor as soon as it happens.  Remove soap buildup in the tub or shower regularly.  Attach bath mats  securely with double-sided non-slip rug tape.  Do not have throw rugs and other things on the floor that can make you trip. What can I do in the bedroom?  Use night lights.  Make sure that you have a light by your bed that is easy to reach.  Do not use any sheets or blankets that are too big for your bed. They should not hang down onto the floor.  Have a firm chair that has side arms. You can use this for support while you get dressed.  Do not have throw rugs and other things on the floor that can make you trip. What can I do in the kitchen?  Clean up any spills right away.  Avoid walking on wet floors.  Keep items that you use a lot in easy-to-reach places.  If you need to reach something above you, use a strong step stool that has a grab bar.  Keep electrical cords out of the way.  Do not use floor polish or wax that makes floors slippery. If you must use wax, use non-skid floor wax.  Do not have throw rugs and other things on the floor that can make you trip. What can I do with my  stairs?  Do not leave any items on the stairs.  Make sure that there are handrails on both sides of the stairs and use them. Fix handrails that are broken or loose. Make sure that handrails are as long as the stairways.  Check any carpeting to make sure that it is firmly attached to the stairs. Fix any carpet that is loose or worn.  Avoid having throw rugs at the top or bottom of the stairs. If you do have throw rugs, attach them to the floor with carpet tape.  Make sure that you have a light switch at the top of the stairs and the bottom of the stairs. If you do not have them, ask someone to add them for you. What else can I do to help prevent falls?  Wear shoes that:  Do not have high heels.  Have rubber bottoms.  Are comfortable and fit you well.  Are closed at the toe. Do not wear sandals.  If you use a stepladder:  Make sure that it is fully opened. Do not climb a closed  stepladder.  Make sure that both sides of the stepladder are locked into place.  Ask someone to hold it for you, if possible.  Clearly mark and make sure that you can see:  Any grab bars or handrails.  First and last steps.  Where the edge of each step is.  Use tools that help you move around (mobility aids) if they are needed. These include:  Canes.  Walkers.  Scooters.  Crutches.  Turn on the lights when you go into a dark area. Replace any light bulbs as soon as they burn out.  Set up your furniture so you have a clear path. Avoid moving your furniture around.  If any of your floors are uneven, fix them.  If there are any pets around you, be aware of where they are.  Review your medicines with your doctor. Some medicines can make you feel dizzy. This can increase your chance of falling. Ask your doctor what other things that you can do to help prevent falls. This information is not intended to replace advice given to you by your health care provider. Make sure you discuss any questions you have with your health care provider. Document Released: 02/02/2009 Document Revised: 09/14/2015 Document Reviewed: 05/13/2014 Elsevier Interactive Patient Education  2017 [employer].

## 2019-11-25 DIAGNOSIS — G2581 Restless legs syndrome: Secondary | ICD-10-CM | POA: Diagnosis not present

## 2019-11-25 DIAGNOSIS — G894 Chronic pain syndrome: Secondary | ICD-10-CM | POA: Diagnosis not present

## 2019-11-25 DIAGNOSIS — G3184 Mild cognitive impairment, so stated: Secondary | ICD-10-CM | POA: Diagnosis not present

## 2019-11-25 DIAGNOSIS — Z20822 Contact with and (suspected) exposure to covid-19: Secondary | ICD-10-CM | POA: Diagnosis not present

## 2019-11-25 DIAGNOSIS — M4316 Spondylolisthesis, lumbar region: Secondary | ICD-10-CM | POA: Diagnosis not present

## 2019-11-25 DIAGNOSIS — G40219 Localization-related (focal) (partial) symptomatic epilepsy and epileptic syndromes with complex partial seizures, intractable, without status epilepticus: Secondary | ICD-10-CM | POA: Diagnosis not present

## 2019-11-25 DIAGNOSIS — G479 Sleep disorder, unspecified: Secondary | ICD-10-CM | POA: Diagnosis not present

## 2019-11-25 DIAGNOSIS — E1142 Type 2 diabetes mellitus with diabetic polyneuropathy: Secondary | ICD-10-CM | POA: Diagnosis not present

## 2019-12-14 ENCOUNTER — Encounter: Payer: Medicare Other | Admitting: Internal Medicine

## 2019-12-14 ENCOUNTER — Other Ambulatory Visit: Payer: Self-pay

## 2019-12-14 ENCOUNTER — Encounter: Payer: Self-pay | Admitting: Internal Medicine

## 2019-12-14 ENCOUNTER — Ambulatory Visit (INDEPENDENT_AMBULATORY_CARE_PROVIDER_SITE_OTHER): Payer: Medicare Other | Admitting: Internal Medicine

## 2019-12-14 VITALS — BP 110/60 | HR 55 | Temp 97.7°F | Ht 67.4 in | Wt 240.0 lb

## 2019-12-14 DIAGNOSIS — I1310 Hypertensive heart and chronic kidney disease without heart failure, with stage 1 through stage 4 chronic kidney disease, or unspecified chronic kidney disease: Secondary | ICD-10-CM | POA: Diagnosis not present

## 2019-12-14 DIAGNOSIS — F5101 Primary insomnia: Secondary | ICD-10-CM

## 2019-12-14 DIAGNOSIS — G4733 Obstructive sleep apnea (adult) (pediatric): Secondary | ICD-10-CM

## 2019-12-14 DIAGNOSIS — G3184 Mild cognitive impairment, so stated: Secondary | ICD-10-CM | POA: Diagnosis not present

## 2019-12-14 DIAGNOSIS — I739 Peripheral vascular disease, unspecified: Secondary | ICD-10-CM | POA: Diagnosis not present

## 2019-12-14 DIAGNOSIS — G40219 Localization-related (focal) (partial) symptomatic epilepsy and epileptic syndromes with complex partial seizures, intractable, without status epilepticus: Secondary | ICD-10-CM | POA: Diagnosis not present

## 2019-12-14 DIAGNOSIS — Z6837 Body mass index (BMI) 37.0-37.9, adult: Secondary | ICD-10-CM | POA: Diagnosis not present

## 2019-12-14 DIAGNOSIS — N182 Chronic kidney disease, stage 2 (mild): Secondary | ICD-10-CM | POA: Diagnosis not present

## 2019-12-14 DIAGNOSIS — E1122 Type 2 diabetes mellitus with diabetic chronic kidney disease: Secondary | ICD-10-CM

## 2019-12-14 NOTE — Progress Notes (Signed)
I,Tianna Badgett,acting as a Education administrator for [name], MD.,have documented all relevant documentation on the behalf of [name], MD,as directed by  [name], MD while in the presence of [name], MD.  This visit occurred during the SARS-CoV-2 public health emergency.  Safety protocols were in place, including screening questions prior to the visit, additional usage of staff PPE, and extensive cleaning of exam room while observing appropriate contact time as indicated for disinfecting solutions.  Subjective:     Patient ID: Franklin Thornton , male    DOB: 04-May-1945 , 74 y.o.   MRN: [id number]   Chief Complaint  Patient presents with  . Diabetes  . Hypertension    HPI  Patient is here for diabetes and hypertension follow up. He reports compliance with meds. He has not been seen since September 2020.   Diabetes He presents for his follow-up diabetic visit. He has type 2 diabetes mellitus. His disease course has been improving. Pertinent negatives for diabetes include no blurred vision and no chest pain. There are no hypoglycemic complications. Risk factors for coronary artery disease include diabetes mellitus, dyslipidemia, hypertension, male sex, obesity and sedentary lifestyle. His breakfast blood glucose is taken between 9-10 am. His breakfast blood glucose range is generally 90-110 mg/dl. An ACE inhibitor/angiotensin II receptor blocker is being taken. Eye exam is current.  Hypertension This is a chronic problem. The current episode started more than 1 year ago. Pertinent negatives include no blurred vision, chest pain, palpitations or shortness of breath.     Past Medical History:  Diagnosis Date  . Atrial fibrillation ([place])   . Diabetes mellitus without complication ([place])   . Peripheral vascular disease ([place])   . Prostate cancer ([place])   . Uvular edema 07/17/2015     Family History  Problem Relation Age of Onset  . Lung cancer Mother   . Diabetes Mother   .  Esophageal cancer Father   . Hypertension Sister   . Breast cancer Sister   . Healthy Son   . Healthy Daughter      Current Outpatient Medications:  .  albuterol (PROVENTIL HFA;VENTOLIN HFA) 108 (90 Base) MCG/ACT inhaler, Inhale 2 puffs into the lungs every 6 (six) hours as needed for wheezing or shortness of breath., Disp: , Rfl:  .  albuterol (PROVENTIL) (2.5 MG/3ML) 0.083% nebulizer solution, Take 3 mLs (2.5 mg total) by nebulization every 6 (six) hours as needed for wheezing or shortness of breath., Disp: 75 mL, Rfl: 3 .  amiodarone (PACERONE) 100 MG tablet, Take 100 mg by mouth daily. , Disp: , Rfl:  .  aspirin 81 MG tablet, Take 81 mg by mouth daily., Disp: , Rfl:  .  atorvastatin (LIPITOR) 20 MG tablet, TAKE 1 TABLET DAILY, Disp: 90 tablet, Rfl: 3 .  b complex vitamins capsule, Take 1 capsule by mouth daily., Disp: , Rfl:  .  budesonide-formoterol (SYMBICORT) 160-4.5 MCG/ACT inhaler, Inhale 2 puffs into the lungs 2 (two) times daily. , Disp: , Rfl:  .  carvedilol (COREG) 6.25 MG tablet, Take 6.25 mg by mouth 2 (two) times daily with a meal., Disp: , Rfl:  .  Cholecalciferol (VITAMIN D) 2000 units tablet, Take 2,000 Units by mouth daily., Disp: , Rfl:  .  diphenhydrAMINE (BENADRYL) 25 MG tablet, Take 25 mg by mouth every 6 (six) hours as needed for itching. , Disp: , Rfl:  .  donepezil (ARICEPT) 10 MG tablet, Take 1 tablet (10 mg total) by  mouth at bedtime., Disp: 90 tablet, Rfl: 1 .  DULoxetine (CYMBALTA) 30 MG capsule, TAKE 1 CAPSULE DAILY IN THE EVENING, Disp: 90 capsule, Rfl: 3 .  furosemide (LASIX) 20 MG tablet, Take 20 mg by mouth daily as needed. , Disp: , Rfl:  .  gabapentin (NEURONTIN) 600 MG tablet, Take 600 mg by mouth 2 (two) times daily., Disp: , Rfl:  .  glucose monitoring kit (FREESTYLE) monitoring kit, 1 each by Does not apply route as needed for other., Disp: , Rfl:  .  HYDROcodone-acetaminophen (NORCO/VICODIN) 5-325 MG tablet, Take 1 tablet by mouth every 6 (six) hours  as needed for moderate pain., Disp: , Rfl:  .  HYDROcodone-homatropine (HYDROMET) 5-1.5 MG/5ML syrup, Take 5 mLs by mouth every 6 (six) hours as needed., Disp: 120 mL, Rfl: 0 .  ipratropium (ATROVENT) 0.03 % nasal spray, Place 2 sprays into both nostrils every 12 (twelve) hours., Disp: , Rfl:  .  JARDIANCE 10 MG TABS tablet, TAKE 1 TABLET DAILY, Disp: 90 tablet, Rfl: 3 .  levocetirizine (XYZAL) 5 MG tablet, Take 5 mg by mouth every evening. , Disp: , Rfl:  .  levothyroxine (SYNTHROID) 50 MCG tablet, TAKE 1 TABLET DAILY, Disp: 90 tablet, Rfl: 3 .  lidocaine (LIDODERM) 5 %, Place 3 patches onto the skin daily. May use 1-3 patches at one time, Remove & Discard patch within 12 hours or as directed by MD (Patient not taking: Reported on 01/28/2019), Disp: 90 patch, Rfl: 0 .  losartan (COZAAR) 50 MG tablet, Take 50 mg by mouth 2 (two) times daily. , Disp: , Rfl:  .  Magnesium 250 MG TABS, Take 1 tablet by mouth daily., Disp: , Rfl:  .  methocarbamol (ROBAXIN) 750 MG tablet, Take 750 mg by mouth 3 (three) times daily as needed for muscle spasms. (Patient not taking: Reported on 12/13/2019), Disp: , Rfl:  .  mirabegron ER (MYRBETRIQ) 50 MG TB24 tablet, Take 50 mg by mouth daily., Disp: , Rfl:  .  montelukast (SINGULAIR) 10 MG tablet, Take 10 mg by mouth at bedtime., Disp: , Rfl:  .  Multiple Vitamin (MULTIVITAMIN) tablet, Take 1 tablet by mouth daily., Disp: , Rfl:  .  nitroGLYCERIN (NITROSTAT) 0.4 MG SL tablet, Place 0.4 mg under the tongue every 5 (five) minutes as needed for chest pain.  (Patient not taking: Reported on 12/13/2019), Disp: , Rfl:  .  Omega-3 Fatty Acids (FISH OIL) 1200 MG CAPS, Take 1,200 mg by mouth. , Disp: , Rfl:  .  OZEMPIC, 0.25 OR 0.5 MG/DOSE, 2 MG/1.5ML SOPN, INJECT 0.$RemoveBefore'5MG'[id number]$  UNDER THE SKIN EVERY WEEK, Disp: 45 mL, Rfl: 3 .  pantoprazole (PROTONIX) 40 MG tablet, TAKE 1 TABLET DAILY, Disp: 90 tablet, Rfl: 3 .  pramipexole (MIRAPEX) 0.5 MG tablet, Take 1 mg by mouth in the morning and at  bedtime. , Disp: , Rfl:  .  rivaroxaban (XARELTO) 10 MG TABS tablet, Take 10 mg by mouth daily., Disp: , Rfl:  .  solifenacin (VESICARE) 5 MG tablet, Take 5 mg by mouth daily. , Disp: , Rfl:  .  vitamin B-12 (CYANOCOBALAMIN) 1000 MCG tablet, Take 1,000 mcg by mouth daily., Disp: , Rfl:    Allergies  Allergen Reactions  . No Known Allergies      Review of Systems  Constitutional: Negative.   Eyes: Negative for blurred vision.  Respiratory: Negative.  Negative for shortness of breath.   Cardiovascular: Negative.  Negative for chest pain and palpitations.  Gastrointestinal: Negative.  Neurological: Negative.   Psychiatric/Behavioral: Positive for sleep disturbance.       Pt is having trouble with sleep.  He is complaining about not being able to sleep at night. He states that he gets up multiple times at night, sometimes to urinate. Has not been using his CPAP, states his pulmonologist stated he does not have OSA.     Today's Vitals   12/14/19 1407  BP: 110/60  Pulse: (!) 55  Temp: 97.7 F (36.5 C)  TempSrc: Oral  Weight: 240 lb (108.9 kg)  Height: 5' 7.4" (1.712 m)   Body mass index is 37.14 kg/m.   Objective:  Physical Exam Vitals and nursing note reviewed.  Constitutional:      Appearance: Normal appearance. He is obese.  Cardiovascular:     Rate and Rhythm: Normal rate and regular rhythm.     Heart sounds: Normal heart sounds.  Pulmonary:     Effort: Pulmonary effort is normal.     Breath sounds: Normal breath sounds.  Musculoskeletal:     Right lower leg: 1+ Pitting Edema present.     Left lower leg: 1+ Pitting Edema present.  Skin:    General: Skin is warm.  Neurological:     General: No focal deficit present.     Mental Status: He is alert.  Psychiatric:        Mood and Affect: Mood normal.         Assessment And Plan:     1. Type 2 diabetes mellitus with stage 2 chronic kidney disease, without long-term current use of insulin (HCC) Comments:  Chronic. Will adjust medications after evaluation of lab results. Importance of regular exercise was discussed with the patient.  - Hemoglobin A1c - CMP14+EGFR - Lipid panel - CBC no Diff  2. Hypertensive heart and renal disease with renal failure, stage 1 through stage 4 or unspecified chronic kidney disease, without heart failure Comments: Chronic, well controlled. He will continue with current meds. He is encouraged to avoid adding salt to his foods.   3. OSA (obstructive sleep apnea) Comments: Chronic. However, he reports that his pulmonologist said he did not have OSA. I will request these records.I will also send him to new neurologist. He agrees.  - Ambulatory referral to Neurology  4. Primary insomnia Comments: Chronic, encouraged to develop bedtime routine. He agrees to referral to sleep specialist.  - Ambulatory referral to Neurology  5. Mild cognitive impairment Comments: Chronic.  He has been evaluated by Neuro in the past. He has been on donepezil in the past, for some reason he has stopped this. I will refill his meds. - Ambulatory referral to Neurology  6. PVD (peripheral vascular disease) (HCC) Comments: Chronic. He is encouraged to walk 30 minutes five days per week, take statin daily and follow a heart healthy lifestyle.   7. 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 He denies recent seizure activity. He has been followed by Neuro in the past; not currently on any anti-epileptics. I will refer to new Neu  8. Class 2 severe obesity due to excess calories with serious comorbidity and body mass index (BMI) of 37.0 to 37.9 in adult [facility]) Comments: He is encouraged to strive for BMI less than 30 to decrease cardiac risk. Advised to walk 30 minutes in his neighborhood at least five days per week.  He is encouraged to initially strive for BMI less than 30 to decrease cardiac risk. He is  advised to exercise  no less than 150 minutes per week.  Wt Readings from Last 3 Encounters:  12/14/19 240 lb (108.9 kg)  11/24/19 235 lb (106.6 kg)  01/12/19 245 lb (111.1 kg)      Patient was given opportunity to ask questions. Patient verbalized understanding of the plan and was able to repeat key elements of the plan. All questions were answered to their satisfaction.  [name], MD   I, [name], MD, have reviewed all documentation for this visit. The documentation on 01/24/20 for the exam, diagnosis, procedures, and orders are all accurate and complete.  THE PATIENT IS ENCOURAGED TO PRACTICE SOCIAL DISTANCING DUE TO THE COVID-19 PANDEMIC.

## 2019-12-14 NOTE — Patient Instructions (Signed)
Diabetes Mellitus and Exercise Exercising regularly is important for your overall health, especially when you have diabetes (diabetes mellitus). Exercising is not only about losing weight. It has many other health benefits, such as increasing muscle strength and bone density and reducing body fat and stress. This leads to improved fitness, flexibility, and endurance, all of which result in better overall health. Exercise has additional benefits for people with diabetes, including:  Reducing appetite.  Helping to lower and control blood glucose.  Lowering blood pressure.  Helping to control amounts of fatty substances (lipids) in the blood, such as cholesterol and triglycerides.  Helping the body to respond better to insulin (improving insulin sensitivity).  Reducing how much insulin the body needs.  Decreasing the risk for heart disease by: ? Lowering cholesterol and triglyceride levels. ? Increasing the levels of good cholesterol. ? Lowering blood glucose levels. What is my activity plan? Your health care provider or certified diabetes educator can help you make a plan for the type and frequency of exercise (activity plan) that works for you. Make sure that you:  Do at least 150 minutes of moderate-intensity or vigorous-intensity exercise each week. This could be brisk walking, biking, or water aerobics. ? Do stretching and strength exercises, such as yoga or weightlifting, at least 2 times a week. ? Spread out your activity over at least 3 days of the week.  Get some form of physical activity every day. ? Do not go more than 2 days in a row without some kind of physical activity. ? Avoid being inactive for more than 30 minutes at a time. Take frequent breaks to walk or stretch.  Choose a type of exercise or activity that you enjoy, and set realistic goals.  Start slowly, and gradually increase the intensity of your exercise over time. What do I need to know about managing my  diabetes?   Check your blood glucose before and after exercising. ? If your blood glucose is 240 mg/dL (13.3 mmol/L) or higher before you exercise, check your urine for ketones. If you have ketones in your urine, do not exercise until your blood glucose returns to normal. ? If your blood glucose is 100 mg/dL (5.6 mmol/L) or lower, eat a snack containing 15-20 grams of carbohydrate. Check your blood glucose 15 minutes after the snack to make sure that your level is above 100 mg/dL (5.6 mmol/L) before you start your exercise.  Know the symptoms of low blood glucose (hypoglycemia) and how to treat it. Your risk for hypoglycemia increases during and after exercise. Common symptoms of hypoglycemia can include: ? Hunger. ? Anxiety. ? Sweating and feeling clammy. ? Confusion. ? Dizziness or feeling light-headed. ? Increased heart rate or palpitations. ? Blurry vision. ? Tingling or numbness around the mouth, lips, or tongue. ? Tremors or shakes. ? Irritability.  Keep a rapid-acting carbohydrate snack available before, during, and after exercise to help prevent or treat hypoglycemia.  Avoid injecting insulin into areas of the body that are going to be exercised. For example, avoid injecting insulin into: ? The arms, when playing tennis. ? The legs, when jogging.  Keep records of your exercise habits. Doing this can help you and your health care provider adjust your diabetes management plan as needed. Write down: ? Food that you eat before and after you exercise. ? Blood glucose levels before and after you exercise. ? The type and amount of exercise you have done. ? When your insulin is expected to peak, if you use   insulin. Avoid exercising at times when your insulin is peaking.  When you start a new exercise or activity, work with your health care provider to make sure the activity is safe for you, and to adjust your insulin, medicines, or food intake as needed.  Drink plenty of water while  you exercise to prevent dehydration or heat stroke. Drink enough fluid to keep your urine clear or pale yellow. Summary  Exercising regularly is important for your overall health, especially when you have diabetes (diabetes mellitus).  Exercising has many health benefits, such as increasing muscle strength and bone density and reducing body fat and stress.  Your health care provider or certified diabetes educator can help you make a plan for the type and frequency of exercise (activity plan) that works for you.  When you start a new exercise or activity, work with your health care provider to make sure the activity is safe for you, and to adjust your insulin, medicines, or food intake as needed. This information is not intended to replace advice given to you by your health care provider. Make sure you discuss any questions you have with your health care provider. Document Revised: 10/31/2016 Document Reviewed: 09/18/2015 Elsevier Patient Education  2020 [employer] Inc.  

## 2019-12-15 LAB — LIPID PANEL
Chol/HDL Ratio: 3.4 ratio (ref 0.0–5.0)
Cholesterol, Total: 166 mg/dL (ref 100–199)
HDL: 49 mg/dL (ref >39)
LDL Chol Calc (NIH): 83 mg/dL (ref 0–99)
Triglycerides: 201 mg/dL — ABNORMAL HIGH (ref 0–149)
VLDL Cholesterol Cal: 34 mg/dL (ref 5–40)

## 2019-12-15 LAB — CBC
Hematocrit: 44.5 % (ref 37.5–51.0)
Hemoglobin: 15.4 g/dL (ref 13.0–17.7)
MCH: 30.7 pg (ref 26.6–33.0)
MCHC: 34.6 g/dL (ref 31.5–35.7)
MCV: 89 fL (ref 79–97)
Platelets: 202 10*3/uL (ref 150–450)
RBC: 5.01 x10E6/uL (ref 4.14–5.80)
RDW: 13.5 % (ref 11.6–15.4)
WBC: 6.8 10*3/uL (ref 3.4–10.8)

## 2019-12-15 LAB — CMP14+EGFR
ALT: 20 IU/L (ref 0–44)
AST: 22 IU/L (ref 0–40)
Albumin/Globulin Ratio: 1.4 (ref 1.2–2.2)
Albumin: 3.9 g/dL (ref 3.7–4.7)
Alkaline Phosphatase: 66 IU/L (ref 48–121)
BUN/Creatinine Ratio: 17 (ref 10–24)
BUN: 18 mg/dL (ref 8–27)
Bilirubin Total: 0.6 mg/dL (ref 0.0–1.2)
CO2: 27 mmol/L (ref 20–29)
Calcium: 9.1 mg/dL (ref 8.6–10.2)
Chloride: 102 mmol/L (ref 96–106)
Creatinine, Ser: 1.08 mg/dL (ref 0.76–1.27)
GFR calc Af Amer: 78 mL/min/{1.73_m2} (ref >59)
GFR calc non Af Amer: 67 mL/min/{1.73_m2} (ref >59)
Globulin, Total: 2.8 g/dL (ref 1.5–4.5)
Glucose: 92 mg/dL (ref 65–99)
Potassium: 4.2 mmol/L (ref 3.5–5.2)
Sodium: 141 mmol/L (ref 134–144)
Total Protein: 6.7 g/dL (ref 6.0–8.5)

## 2019-12-15 LAB — HEMOGLOBIN A1C
Est. average glucose Bld gHb Est-mCnc: 148 mg/dL
Hgb A1c MFr Bld: 6.8 % — ABNORMAL HIGH (ref 4.8–5.6)

## 2019-12-17 ENCOUNTER — Telehealth: Payer: Self-pay

## 2019-12-17 NOTE — Telephone Encounter (Signed)
-----   Message from [name], MD sent at 12/16/2019  7:12 PM EDT ----- Here are your lab results:  Your hba1c is 6.8, it has gone up. Please take meds as prescribed.   Your liver and kidney function are stable. Your triglycerides are elevated. Please take cholesterol meds as prescribed, LDL is not yet at goal.   Please let me know if you have any questions or concerns. Stay safe!   Sincerely,    Robyn N. Baird Cancer, MD

## 2019-12-17 NOTE — Telephone Encounter (Signed)
Left vm for pt to look at results on mychart or call the office

## 2019-12-20 NOTE — Patient Instructions (Addendum)
Visit Information  Goals Addressed            This Visit's Progress   . Pharmacy Care Plan       CARE PLAN ENTRY (see longitudinal plan of care for additional care plan information)  Current Barriers:  . Chronic Disease Management support, education, and care coordination needs related to Hypertension, Hyperlipidemia, and Diabetes   Hypertension BP Readings from Last 3 Encounters:  12/14/19 110/60  11/24/19 120/75  01/12/19 102/60   . Pharmacist Clinical Goal(s): o Over the next 180 days, patient will work with PharmD and providers to maintain BP goal <130/80 . Current regimen:   Carvedilol 6.25 mg twice daily  Furosemide 20 mg daily prn  Losartan 50 mg twice daily . Interventions: o Provided dietary and exercise recommendations . Patient self care activities - Over the next 180 days, patient will: o Check BP periodically and if symptomatic document, and provide at future appointments o Ensure daily salt intake < 2300 mg/day o Try to exercise for 30 minutes 5 times weekly  - Start slow with 1-2 days per week and work up to 5  Hyperlipidemia Lab Results  Component Value Date/Time   [facility] 83 12/14/2019 04:25 PM   . Pharmacist Clinical Goal(s): o Over the next 180 days, patient will work with PharmD and providers to maintain LDL goal < 70 . Current regimen:  . Atorvastatin 20 mg daily . Omega-3 Fatty acids 1200 mg by mouth daily . Interventions: o Provided dietary and exercise recommendations . Patient self care activities - Over the next 180 days, patient will: o Try to exercise for 30 minutes 5 times weekly  - Start slow with 1-2 days per week and work up to [place] for eating well-balanced meal  Diabetes Lab Results  Component Value Date/Time   HGBA1C 6.8 (H) 12/14/2019 04:25 PM   HGBA1C 6.1 (H) 11/18/2018 10:57 AM   . Pharmacist Clinical Goal(s): o Over the next 180 days, patient will work with PharmD and providers to maintain A1c goal  <7% . Current regimen:   Jardiance 10 mg daily  Ozempic 0.5 mg under the skin every week . Interventions: o Provided dietary and exercise recommendations o Discussed signs and symptoms of hypoglycemia and how to treat . Patient self care activities - Over the next 180 days, patient will: o Check blood sugar periodically and if symptomatic, document, and provide at future appointments o Contact provider with any episodes of hypoglycemia o Try to exercise for 30 minutes 5 times weekly  - Start slow with 1-2 days per week and work up to 5  Medication management . Pharmacist Clinical Goal(s): o Over the next 180 days, patient will work with PharmD and providers to maintain optimal medication adherence . Current pharmacy: [facility] . Interventions o Comprehensive medication review performed. o Continue current medication management strategy . Patient self care activities - Over the next 180 days, patient will: o Focus on medication adherence by utilizing a pill box o Take medications as prescribed o Report any questions or concerns to PharmD and/or provider(s)  Initial goal documentation        Franklin Thornton was given information about Chronic Care Management services today including:  1. CCM service includes personalized support from designated clinical staff supervised by his physician, including individualized plan of care and coordination with other care providers 2. 24/7 contact phone numbers for assistance for urgent and routine care needs. 3. Standard insurance, coinsurance, copays  and deductibles apply for chronic care management only during months in which we provide at least 20 minutes of these services. Most insurances cover these services at 100%, however patients may be responsible for any copay, coinsurance and/or deductible if applicable. This service may help you avoid the need for more expensive face-to-face services. 4. Only one practitioner may  furnish and bill the service in a calendar month. 5. The patient may stop CCM services at any time (effective at the end of the month) by phone call to the office staff.  Patient agreed to services and verbal consent obtained.   The patient verbalized understanding of instructions provided today and agreed to receive a mailed copy of patient instruction and/or educational materials. Telephone follow up appointment with pharmacy team member scheduled for: 02/17/20 @ 4:15 PM  Jannette Fogo, PharmD Clinical Pharmacist [facility] [phone]   Diabetes Mellitus and Nutrition, Adult When you have diabetes (diabetes mellitus), it is very important to have healthy eating habits because your blood sugar (glucose) levels are greatly affected by what you eat and drink. Eating healthy foods in the appropriate amounts, at about the same times every day, can help you:  Control your blood glucose.  Lower your risk of heart disease.  Improve your blood pressure.  Reach or maintain a healthy weight. Every person with diabetes is different, and each person has different needs for a meal plan. Your health care provider may recommend that you work with a diet and nutrition specialist (dietitian) to make a meal plan that is best for you. Your meal plan may vary depending on factors such as:  The calories you need.  The medicines you take.  Your weight.  Your blood glucose, blood pressure, and cholesterol levels.  Your activity level.  Other health conditions you have, such as heart or kidney disease. How do carbohydrates affect me? Carbohydrates, also called carbs, affect your blood glucose level more than any other type of food. Eating carbs naturally raises the amount of glucose in your blood. Carb counting is a method for keeping track of how many carbs you eat. Counting carbs is important to keep your blood glucose at a healthy level, especially if you use insulin or  take certain oral diabetes medicines. It is important to know how many carbs you can safely have in each meal. This is different for every person. Your dietitian can help you calculate how many carbs you should have at each meal and for each snack. Foods that contain carbs include:  Bread, cereal, rice, pasta, and crackers.  Potatoes and corn.  Peas, beans, and lentils.  Milk and yogurt.  Fruit and juice.  Desserts, such as cakes, cookies, ice cream, and candy. How does alcohol affect me? Alcohol can cause a sudden decrease in blood glucose (hypoglycemia), especially if you use insulin or take certain oral diabetes medicines. Hypoglycemia can be a life-threatening condition. Symptoms of hypoglycemia (sleepiness, dizziness, and confusion) are similar to symptoms of having too much alcohol. If your health care provider says that alcohol is safe for you, follow these guidelines:  Limit alcohol intake to no more than 1 drink per day for nonpregnant women and 2 drinks per day for men. One drink equals 12 oz of beer, 5 oz of wine, or 1 oz of hard liquor.  Do not drink on an empty stomach.  Keep yourself hydrated with water, diet soda, or unsweetened iced tea.  Keep in mind that regular soda, juice, and other  mixers may contain a lot of sugar and must be counted as carbs. What are tips for following this plan?  Reading food labels  Start by checking the serving size on the "Nutrition Facts" label of packaged foods and drinks. The amount of calories, carbs, fats, and other nutrients listed on the label is based on one serving of the item. Many items contain more than one serving per package.  Check the total grams (g) of carbs in one serving. You can calculate the number of servings of carbs in one serving by dividing the total carbs by 15. For example, if a food has 30 g of total carbs, it would be equal to 2 servings of carbs.  Check the number of grams (g) of saturated and trans fats in  one serving. Choose foods that have low or no amount of these fats.  Check the number of milligrams (mg) of salt (sodium) in one serving. Most people should limit total sodium intake to less than 2,300 mg per day.  Always check the nutrition information of foods labeled as "low-fat" or "nonfat". These foods may be higher in added sugar or refined carbs and should be avoided.  Talk to your dietitian to identify your daily goals for nutrients listed on the label. Shopping  Avoid buying canned, premade, or processed foods. These foods tend to be high in fat, sodium, and added sugar.  Shop around the outside edge of the grocery store. This includes fresh fruits and vegetables, bulk grains, fresh meats, and fresh dairy. Cooking  Use low-heat cooking methods, such as baking, instead of high-heat cooking methods like deep frying.  Cook using healthy oils, such as olive, canola, or sunflower oil.  Avoid cooking with butter, cream, or high-fat meats. Meal planning  Eat meals and snacks regularly, preferably at the same times every day. Avoid going long periods of time without eating.  Eat foods high in fiber, such as fresh fruits, vegetables, beans, and whole grains. Talk to your dietitian about how many servings of carbs you can eat at each meal.  Eat 4-6 ounces (oz) of lean protein each day, such as lean meat, chicken, fish, eggs, or tofu. One oz of lean protein is equal to: ? 1 oz of meat, chicken, or fish. ? 1 egg. ?  cup of tofu.  Eat some foods each day that contain healthy fats, such as avocado, nuts, seeds, and fish. Lifestyle  Check your blood glucose regularly.  Exercise regularly as told by your health care provider. This may include: ? 150 minutes of moderate-intensity or vigorous-intensity exercise each week. This could be brisk walking, biking, or water aerobics. ? Stretching and doing strength exercises, such as yoga or weightlifting, at least 2 times a week.  Take  medicines as told by your health care provider.  Do not use any products that contain nicotine or tobacco, such as cigarettes and e-cigarettes. If you need help quitting, ask your health care provider.  Work with a Social worker or diabetes educator to identify strategies to manage stress and any emotional and social challenges. Questions to ask a health care provider  Do I need to meet with a diabetes educator?  Do I need to meet with a dietitian?  What number can I call if I have questions?  When are the best times to check my blood glucose? Where to find more information:  American Diabetes Association: diabetes.org  Academy of Nutrition and Dietetics: www.eatright.[employer] of Diabetes and Digestive and  Kidney Diseases (NIH): [employer] Summary  A healthy meal plan will help you control your blood glucose and maintain a healthy lifestyle.  Working with a diet and nutrition specialist (dietitian) can help you make a meal plan that is best for you.  Keep in mind that carbohydrates (carbs) and alcohol have immediate effects on your blood glucose levels. It is important to count carbs and to use alcohol carefully. This information is not intended to replace advice given to you by your health care provider. Make sure you discuss any questions you have with your health care provider. Document Revised: 03/21/2017 Document Reviewed: 05/13/2016 Elsevier Patient Education  2020 [employer].

## 2019-12-31 ENCOUNTER — Other Ambulatory Visit: Payer: Self-pay | Admitting: Internal Medicine

## 2020-01-05 ENCOUNTER — Other Ambulatory Visit: Payer: Self-pay

## 2020-01-05 ENCOUNTER — Telehealth: Payer: Self-pay

## 2020-01-05 MED ORDER — DONEPEZIL HCL 10 MG PO TABS
10.0000 mg | ORAL_TABLET | Freq: Every day | ORAL | 1 refills | Status: DC
Start: 2020-01-05 — End: 2020-08-03

## 2020-01-05 NOTE — Telephone Encounter (Signed)
I left a message for the pt to return.

## 2020-01-05 NOTE — Telephone Encounter (Signed)
-----   Message from [name], MD sent at 01/04/2020 10:11 PM EDT ----- You may send refill for aricept (donepezil). Is there a reason his neurologist is not refilling these meds for him? Neurologist typically prescribes meds to keep him awake as well .Was he previously on provigil or nuvigil?   RS ----- Message ----- From: Michelle Nasuti, [place] Sent: 12/31/2019   7:37 AM EDT To: [name], MD  The pt left  a message that he was supposed to have a urology referral and prescriptions for aricept and something to help him stay awake during the day.

## 2020-01-05 NOTE — Telephone Encounter (Signed)
-----   Message from [name], MD sent at 01/04/2020 10:11 PM EDT ----- You may send refill for aricept (donepezil). Is there a reason his neurologist is not refilling these meds for him? Neurologist typically prescribes meds to keep him awake as well .Was he previously on provigil or nuvigil?   RS ----- Message ----- From: Michelle Nasuti, [place] Sent: 12/31/2019   7:37 AM EDT To: [name], MD  The pt left  a message that he was supposed to have a urology referral and prescriptions for aricept and something to help him stay awake during the day.

## 2020-01-05 NOTE — Telephone Encounter (Signed)
The pt said that he was seeing a Dr. Sabra Heck a neurologist in [place], that Dr. Dorie Rank to [place].  He was asked if is was another neurologist in that office with Dr. Sabra Heck that he could see and the pt said not that he is aware of.  The pt said no he has never been on provigil or nuvigil.  The pt said that he was supposed to get a neurologist referral from Dr.Sanders.  The pt was told that I apologize but his original message sounded like he said urologist.

## 2020-01-06 DIAGNOSIS — R498 Other voice and resonance disorders: Secondary | ICD-10-CM | POA: Diagnosis not present

## 2020-01-06 DIAGNOSIS — J4520 Mild intermittent asthma, uncomplicated: Secondary | ICD-10-CM | POA: Diagnosis not present

## 2020-01-06 DIAGNOSIS — G2581 Restless legs syndrome: Secondary | ICD-10-CM | POA: Diagnosis not present

## 2020-01-06 DIAGNOSIS — J301 Allergic rhinitis due to pollen: Secondary | ICD-10-CM | POA: Diagnosis not present

## 2020-01-06 DIAGNOSIS — R5383 Other fatigue: Secondary | ICD-10-CM | POA: Diagnosis not present

## 2020-01-06 DIAGNOSIS — R918 Other nonspecific abnormal finding of lung field: Secondary | ICD-10-CM | POA: Diagnosis not present

## 2020-01-13 DIAGNOSIS — J4520 Mild intermittent asthma, uncomplicated: Secondary | ICD-10-CM | POA: Diagnosis not present

## 2020-01-13 DIAGNOSIS — R918 Other nonspecific abnormal finding of lung field: Secondary | ICD-10-CM | POA: Diagnosis not present

## 2020-01-13 DIAGNOSIS — G4733 Obstructive sleep apnea (adult) (pediatric): Secondary | ICD-10-CM | POA: Diagnosis not present

## 2020-01-17 DIAGNOSIS — Z8546 Personal history of malignant neoplasm of prostate: Secondary | ICD-10-CM | POA: Diagnosis not present

## 2020-01-18 ENCOUNTER — Telehealth: Payer: Self-pay

## 2020-01-18 NOTE — Progress Notes (Signed)
Chronic Care Management Pharmacy Assistant   Name: DUSTEN ELLINWOOD  MRN: [id number] DOB: [birth year]  Reason for Encounter: Disease State - Diabetes , Hypertension and Lipid State Call.  Patient Questions:  1.  Have you seen any other providers since your last visit? Yes.  11/25/2019- Jacques Navy,  MD - [facility]. 12/14/2019- [name], MD  - PCP     2.  Any changes in your medicines or health? No    PCP : [name], MD  Allergies:   Allergies  Allergen Reactions  . No Known Allergies     Medications: Outpatient Encounter Medications as of 01/18/2020  Medication Sig  . albuterol (PROVENTIL HFA;VENTOLIN HFA) 108 (90 Base) MCG/ACT inhaler Inhale 2 puffs into the lungs every 6 (six) hours as needed for wheezing or shortness of breath.  [name] albuterol (PROVENTIL) (2.5 MG/3ML) 0.083% nebulizer solution Take 3 mLs (2.5 mg total) by nebulization every 6 (six) hours as needed for wheezing or shortness of breath.  [name] amiodarone (PACERONE) 100 MG tablet Take 100 mg by mouth daily.   [name] aspirin 81 MG tablet Take 81 mg by mouth daily.  [name] atorvastatin (LIPITOR) 20 MG tablet TAKE 1 TABLET DAILY  . b complex vitamins capsule Take 1 capsule by mouth daily.  . budesonide-formoterol (SYMBICORT) 160-4.5 MCG/ACT inhaler Inhale 2 puffs into the lungs 2 (two) times daily.   . carvedilol (COREG) 6.25 MG tablet Take 6.25 mg by mouth 2 (two) times daily with a meal.  . Cholecalciferol (VITAMIN D) 2000 units tablet Take 2,000 Units by mouth daily.  . diphenhydrAMINE (BENADRYL) 25 MG tablet Take 25 mg by mouth every 6 (six) hours as needed for itching.   . donepezil (ARICEPT) 10 MG tablet Take 1 tablet (10 mg total) by mouth at bedtime.  . DULoxetine (CYMBALTA) 30 MG capsule TAKE 1 CAPSULE DAILY IN THE EVENING  . furosemide (LASIX) 20 MG tablet Take 20 mg by mouth daily as needed.   . gabapentin (NEURONTIN) 600 MG tablet Take 600 mg by mouth 2 (two) times daily.  [name]  glucose monitoring kit (FREESTYLE) monitoring kit 1 each by Does not apply route as needed for other.  . HYDROcodone-acetaminophen (NORCO/VICODIN) 5-325 MG tablet Take 1 tablet by mouth every 6 (six) hours as needed for moderate pain.  [name] HYDROcodone-homatropine (HYDROMET) 5-1.5 MG/5ML syrup Take 5 mLs by mouth every 6 (six) hours as needed.  [name] ipratropium (ATROVENT) 0.03 % nasal spray Place 2 sprays into both nostrils every 12 (twelve) hours.  [name] JARDIANCE 10 MG TABS tablet TAKE 1 TABLET DAILY  . levocetirizine (XYZAL) 5 MG tablet Take 5 mg by mouth every evening.   [name] levothyroxine (SYNTHROID) 50 MCG tablet TAKE 1 TABLET DAILY  . lidocaine (LIDODERM) 5 % Place 3 patches onto the skin daily. May use 1-3 patches at one time, Remove & Discard patch within 12 hours or as directed by MD (Patient not taking: Reported on 01/28/2019)  . losartan (COZAAR) 50 MG tablet Take 50 mg by mouth 2 (two) times daily.   . Magnesium 250 MG TABS Take 1 tablet by mouth daily.  . methocarbamol (ROBAXIN) 750 MG tablet Take 750 mg by mouth 3 (three) times daily as needed for muscle spasms. (Patient not taking: Reported on 12/13/2019)  . mirabegron ER (MYRBETRIQ) 50 MG TB24 tablet Take 50 mg by mouth daily.  . montelukast (SINGULAIR) 10 MG tablet Take 10 mg by mouth at bedtime.  . Multiple  Vitamin (MULTIVITAMIN) tablet Take 1 tablet by mouth daily.  . nitroGLYCERIN (NITROSTAT) 0.4 MG SL tablet Place 0.4 mg under the tongue every 5 (five) minutes as needed for chest pain.  (Patient not taking: Reported on 12/13/2019)  . Omega-3 Fatty Acids (FISH OIL) 1200 MG CAPS Take 1,200 mg by mouth.   [name] OZEMPIC, 0.25 OR 0.5 MG/DOSE, 2 MG/1.5ML SOPN INJECT 0.5MG UNDER THE SKIN EVERY WEEK  . pantoprazole (PROTONIX) 40 MG tablet TAKE 1 TABLET DAILY  . pramipexole (MIRAPEX) 0.5 MG tablet Take 1 mg by mouth in the morning and at bedtime.   . rivaroxaban (XARELTO) 10 MG TABS tablet Take 10 mg by mouth daily.  . solifenacin (VESICARE) 5 MG tablet  Take 5 mg by mouth daily.   . vitamin B-12 (CYANOCOBALAMIN) 1000 MCG tablet Take 1,000 mcg by mouth daily.   No facility-administered encounter medications on file as of 01/18/2020.    Current Diagnosis: Patient Active Problem List   Diagnosis Date Noted  . PVD (peripheral vascular disease) ([place]) 12/30/2018  . Type 2 diabetes mellitus with stage 2 chronic kidney disease, without long-term current use of insulin ([place]) 06/17/2018  . 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  . Atherosclerosis of native coronary artery of native heart without angina pectoris 06/17/2018  . Arrhythmia 11/18/2017  . Hyperlipidemia 11/18/2017  . Hypertension 11/18/2017  . Varicose veins of left lower extremity with complications 25/63/8937  . Pain in both lower extremities 08/14/2015  . Globus pharyngeus 07/17/2015  . Abdominal pain 12/12/2013  . History of carcinoma in situ of prostate 12/12/2013  . Male erectile dysfunction 12/12/2013  . Personal history of prostate cancer 12/12/2013  . OAB (overactive bladder) 11/03/2013  . Malignant neoplasm of prostate ([place]) 11/03/2013  . Nocturia 11/03/2013  . Stress incontinence 11/03/2013  . Urinary incontinence 11/03/2013  . Headache associated with sexual activity 12/14/2012  . Memory loss 12/14/2012  . Myalgia and myositis 12/14/2012  . Obstructive sleep apnea 12/14/2012  . Insomnia 12/14/2012  . Partial epilepsy with impairment of consciousness, with intractable epilepsy ([place]) 12/14/2012  . Restless legs syndrome 12/14/2012  . Tension type headache 12/14/2012  . Dehydration, severe 07/11/2012  . Metabolic acidosis 34/28/7681  . Noninfective gastroenteritis and colitis 07/11/2012   Recent Relevant Labs: Lab Results  Component Value Date/Time   HGBA1C 6.8 (H) 12/14/2019 04:25 PM   HGBA1C 6.1 (H) 11/18/2018 10:57 AM   MICROALBUR 30 07/30/2018 12:32 PM    Kidney Function Lab Results  Component Value Date/Time   CREATININE 1.08 12/14/2019 04:25  PM   CREATININE 1.05 11/18/2018 10:57 AM   GFRNONAA 67 12/14/2019 04:25 PM   GFRAA 78 12/14/2019 04:25 PM    . Current antihyperglycemic regimen:  o Jardiance 10 mg daily o Ozempic 0.5 weekly  . What recent interventions/DTPs have been made to improve glycemic control:  o Patient states he is taking medication as directed by provider.  . Have there been any recent hospitalizations or ED visits since last visit with CPP? No   . Patient denies hypoglycemic symptoms, including Sweaty, Shaky, Hungry, Nervous/irritable and Vision changes . Patient denies hyperglycemic symptoms, including blurry vision, excessive thirst, fatigue, polyuria and weakness   . How often are you checking your blood sugar? Patient states he only checks when he feels bad.  . What are your blood sugars ranging? Patient states he has not been checking blood sugars. o Fasting: none o Before meals: none o After meals: none o Bedtime: none  . During the  week, how often does your blood glucose drop below 70?  Patent states he does not feel like his blood glucose has been below 70. . Are you checking your feet daily/regularly? Yes. Patient has no open sores, but has numbness in feet.  Patient states that he has had some swelling in feet and lower legs, but no pain.  Adherence Review: Is the patient currently on a STATIN medication? Yes Atorvastatin 20 mg tablet daily Is the patient currently on ACE/ARB medication? Yes. Losartan 50 mg twice a day Does the patient have >5 day gap between last estimated fill dates? No   Reviewed chart prior to disease state call. Spoke with patient regarding BP  Recent Office Vitals: BP Readings from Last 3 Encounters:  12/14/19 110/60  11/24/19 120/75  01/12/19 102/60   Pulse Readings from Last 3 Encounters:  12/14/19 (!) 55  11/24/19 61  01/12/19 79    Wt Readings from Last 3 Encounters:  12/14/19 240 lb (108.9 kg)  11/24/19 235 lb (106.6 kg)  01/12/19 245 lb (111.1 kg)       Kidney Function Lab Results  Component Value Date/Time   CREATININE 1.08 12/14/2019 04:25 PM   CREATININE 1.05 11/18/2018 10:57 AM   GFRNONAA 67 12/14/2019 04:25 PM   GFRAA 78 12/14/2019 04:25 PM    BMP Latest Ref Rng & Units 12/14/2019 11/18/2018 06/17/2018  Glucose 65 - 99 mg/dL 92 112(H) 86  BUN 8 - 27 mg/dL _0 Creatinine 0.76 - 1.27 mg/dL 1.08 1.05 0.94  BUN/Creat Ratio 10 - _1 Sodium 134 - 144 mmol/L 141 142 142  Potassium 3.5 - 5.2 mmol/L 4.2 5.0 4.8  Chloride 96 - 106 mmol/L 102 100 102  CO2 20 - 29 mmol/L _2 Calcium 8.6 - 10.2 mg/dL 9.1 9.4 9.1    . Current antihypertensive regimen:  o Carvedilol 6.25 tablet twice a day o Furosemide 20 mg tablet daily prn o Losartan 50 mg twice a day  . How often are you checking your Blood Pressure? Patient states he does not take blood pressure at home unless he feels bad.   . Current home BP readings: none  . What recent interventions/DTPs have been made by any provider to improve Blood Pressure control since last CPP Visit: Patient states he takes his medications as directed by provider.  . Any recent hospitalizations or ED visits since last visit with CPP? No . What diet changes have been made to improve Blood Pressure Control?  o Patient states he is not on a healthy diet.   . What exercise is being done to improve your Blood Pressure Control?  o Patient states he does not have a current exercise regimen.   Adherence Review: Is the patient currently on ACE/ARB medication? Yes - Losartan 50 mg twice a day Does the patient have >5 day gap between last estimated fill dates? No   Comprehensive medication review performed; Spoke to patient regarding cholesterol  Lipid Panel    Component Value Date/Time   CHOL 166 12/14/2019 1625   TRIG 201 (H) 12/14/2019 1625   HDL 49 12/14/2019 1625   LDLCALC 83 12/14/2019 1625    10-year ASCVD risk score: The 10-year ASCVD risk score Mikey Bussing DC Brooke Bonito., et al.,  2013) is: 37.1%   Values used to calculate the score:     Age: 74 years     Sex: Male     Is Non-Hispanic African American: No  Diabetic: Yes     Tobacco smoker: No     Systolic Blood Pressure: 481 mmHg     Is BP treated: Yes     HDL Cholesterol: 49 mg/dL     Total Cholesterol: 166 mg/dL  . Current antihyperlipidemic regimen:  o Atorvastatin 20 mg one tablet a day  . Previous antihyperlipidemic medications tried: none . ASCVD risk enhancing conditions: age >69, DM, HTN, CKD, CHF, current smoker  . What recent interventions/DTPs have been made by any provider to improve Cholesterol control since last CPP Visit: Patient states he takes medication as directed by provider.  . Any recent hospitalizations or ED visits since last visit with CPP? No  .  . What diet changes have been made to improve Cholesterol?  o Patient states he does not eat healthy.  . What exercise is being done to improve Cholesterol?  o Patient states he has no exercise regimen.  Adherence Review: Does the patient have >5 day gap between last estimated fill dates? No      Goals Addressed            This Visit's Progress   . Pharmacy Care Plan   Not on track    [place] (see longitudinal plan of care for additional care plan information)  Current Barriers:  . Chronic Disease Management support, education, and care coordination needs related to Hypertension, Hyperlipidemia, and Diabetes   Hypertension BP Readings from Last 3 Encounters:  12/14/19 110/60  11/24/19 120/75  01/12/19 102/60   . Pharmacist Clinical Goal(s): o Over the next 180 days, patient will work with PharmD and providers to maintain BP goal <130/80 . Current regimen:   Carvedilol 6.25 mg twice daily  Furosemide 20 mg daily prn  Losartan 50 mg twice daily . Interventions: o Provided dietary and exercise recommendations . Patient self care activities - Over the next 180 days, patient will: o Check BP periodically  and if symptomatic document, and provide at future appointments o Ensure daily salt intake < 2300 mg/day o Try to exercise for 30 minutes 5 times weekly  - Start slow with 1-2 days per week and work up to 5  Hyperlipidemia Lab Results  Component Value Date/Time   [facility] 83 12/14/2019 04:25 PM   . Pharmacist Clinical Goal(s): o Over the next 180 days, patient will work with PharmD and providers to maintain LDL goal < 70 . Current regimen:  . Atorvastatin 20 mg daily . Omega-3 Fatty acids 1200 mg by mouth daily . Interventions: o Provided dietary and exercise recommendations . Patient self care activities - Over the next 180 days, patient will: o Try to exercise for 30 minutes 5 times weekly  - Start slow with 1-2 days per week and work up to [place] for eating well-balanced meal  Diabetes Lab Results  Component Value Date/Time   HGBA1C 6.8 (H) 12/14/2019 04:25 PM   HGBA1C 6.1 (H) 11/18/2018 10:57 AM   . Pharmacist Clinical Goal(s): o Over the next 180 days, patient will work with PharmD and providers to maintain A1c goal <7% . Current regimen:   Jardiance 10 mg daily  Ozempic 0.5 mg under the skin every week . Interventions: o Provided dietary and exercise recommendations o Discussed signs and symptoms of hypoglycemia and how to treat . Patient self care activities - Over the next 180 days, patient will: o Check blood sugar periodically and if symptomatic, document, and provide at future appointments o Contact provider  with any episodes of hypoglycemia o Try to exercise for 30 minutes 5 times weekly  - Start slow with 1-2 days per week and work up to 5  Medication management . Pharmacist Clinical Goal(s): o Over the next 180 days, patient will work with PharmD and providers to maintain optimal medication adherence . Current pharmacy: [facility] . Interventions o Comprehensive medication review performed. o Continue current  medication management strategy . Patient self care activities - Over the next 180 days, patient will: o Focus on medication adherence by utilizing a pill box o Take medications as prescribed o Report any questions or concerns to PharmD and/or provider(s)  Initial goal documentation        Follow-Up:  Pharmacist Review - Patient states that he will start checking his blood sugars more regular. Patient has not been eating healthy or exercising.  Jannette Fogo, CPP Notified.  Judithann Sheen, [facility] Clinical Pharmacist Assistant [phone]

## 2020-01-20 DIAGNOSIS — R498 Other voice and resonance disorders: Secondary | ICD-10-CM | POA: Diagnosis not present

## 2020-01-20 DIAGNOSIS — N3281 Overactive bladder: Secondary | ICD-10-CM | POA: Diagnosis not present

## 2020-01-20 DIAGNOSIS — Z8546 Personal history of malignant neoplasm of prostate: Secondary | ICD-10-CM | POA: Diagnosis not present

## 2020-01-20 DIAGNOSIS — G4733 Obstructive sleep apnea (adult) (pediatric): Secondary | ICD-10-CM | POA: Diagnosis not present

## 2020-01-20 DIAGNOSIS — G2581 Restless legs syndrome: Secondary | ICD-10-CM | POA: Diagnosis not present

## 2020-01-20 DIAGNOSIS — J301 Allergic rhinitis due to pollen: Secondary | ICD-10-CM | POA: Diagnosis not present

## 2020-01-20 DIAGNOSIS — R918 Other nonspecific abnormal finding of lung field: Secondary | ICD-10-CM | POA: Diagnosis not present

## 2020-01-20 DIAGNOSIS — J4520 Mild intermittent asthma, uncomplicated: Secondary | ICD-10-CM | POA: Diagnosis not present

## 2020-01-24 ENCOUNTER — Telehealth: Payer: Self-pay

## 2020-01-24 NOTE — Telephone Encounter (Signed)
The pt said that he was seeing Dr. Sabra Heck after Dr. Rollene Rotunda moved to [facility] and that he didn't want to see Dr. Sabra Heck anymore because he feels like he isn't doing anything and that he saw the urologist yesterday, his PSA is 0 and that he doesn't have to go back to the urologist anymore if Dr. Baird Cancer doesn't mind refilling his vesicare and mirapax.

## 2020-01-24 NOTE — Telephone Encounter (Signed)
-----   Message from [name], MD sent at 01/23/2020  4:08 PM EDT ----- Please confirm with patient - is he still seeing neurologist? Ifyes, why did they not refill his aricept? If he is no longer seeing Neuro, does he want referral to a new provider?   I am confused by previous message, sorry ----- Message ----- From: Michelle Nasuti, [place] Sent: 12/31/2019   7:37 AM EDT To: [name], MD  The pt left  a message that he was supposed to have a urology referral and prescriptions for aricept and something to help him stay awake during the day.

## 2020-01-25 ENCOUNTER — Other Ambulatory Visit: Payer: Self-pay

## 2020-01-25 MED ORDER — SOLIFENACIN SUCCINATE 5 MG PO TABS
5.0000 mg | ORAL_TABLET | Freq: Every day | ORAL | 1 refills | Status: DC
Start: 2020-01-25 — End: 2023-07-25

## 2020-01-25 MED ORDER — PRAMIPEXOLE DIHYDROCHLORIDE 0.5 MG PO TABS
1.0000 mg | ORAL_TABLET | Freq: Two times a day (BID) | ORAL | 1 refills | Status: DC
Start: 2020-01-25 — End: 2023-01-16

## 2020-01-25 NOTE — Progress Notes (Signed)
Erroneous encounter, duplicate encounter

## 2020-01-25 NOTE — Telephone Encounter (Signed)
You can send in 90 plus 1 refills

## 2020-02-17 ENCOUNTER — Ambulatory Visit: Payer: Self-pay

## 2020-02-17 DIAGNOSIS — J4520 Mild intermittent asthma, uncomplicated: Secondary | ICD-10-CM | POA: Diagnosis not present

## 2020-02-17 DIAGNOSIS — G2581 Restless legs syndrome: Secondary | ICD-10-CM | POA: Diagnosis not present

## 2020-02-17 DIAGNOSIS — R918 Other nonspecific abnormal finding of lung field: Secondary | ICD-10-CM | POA: Diagnosis not present

## 2020-02-17 DIAGNOSIS — J301 Allergic rhinitis due to pollen: Secondary | ICD-10-CM | POA: Diagnosis not present

## 2020-02-17 DIAGNOSIS — G4733 Obstructive sleep apnea (adult) (pediatric): Secondary | ICD-10-CM | POA: Diagnosis not present

## 2020-02-17 DIAGNOSIS — R498 Other voice and resonance disorders: Secondary | ICD-10-CM | POA: Diagnosis not present

## 2020-02-17 NOTE — Chronic Care Management (AMB) (Signed)
Chronic Care Management Pharmacy  Name: Franklin Thornton  MRN: [id number] DOB: 1945-06-11  Chief Complaint/ HPI  [name],  74 y.o. , male presents for their Follow-Up CCM visit with the clinical pharmacist via telephone due to COVID-19 Pandemic.  PCP : [name], MD  Medications: Outpatient Encounter Medications as of 02/17/2020  Medication Sig  . albuterol (PROVENTIL HFA;VENTOLIN HFA) 108 (90 Base) MCG/ACT inhaler Inhale 2 puffs into the lungs every 6 (six) hours as needed for wheezing or shortness of breath.  [name] albuterol (PROVENTIL) (2.5 MG/3ML) 0.083% nebulizer solution Take 3 mLs (2.5 mg total) by nebulization every 6 (six) hours as needed for wheezing or shortness of breath.  [name] amiodarone (PACERONE) 100 MG tablet Take 100 mg by mouth daily.   [name] aspirin 81 MG tablet Take 81 mg by mouth daily.  [name] atorvastatin (LIPITOR) 20 MG tablet TAKE 1 TABLET DAILY  . b complex vitamins capsule Take 1 capsule by mouth daily.  . budesonide-formoterol (SYMBICORT) 160-4.5 MCG/ACT inhaler Inhale 2 puffs into the lungs 2 (two) times daily.   . carvedilol (COREG) 6.25 MG tablet Take 6.25 mg by mouth 2 (two) times daily with a meal.  . Cholecalciferol (VITAMIN D) 2000 units tablet Take 2,000 Units by mouth daily.  . diphenhydrAMINE (BENADRYL) 25 MG tablet Take 25 mg by mouth every 6 (six) hours as needed for itching.   . donepezil (ARICEPT) 10 MG tablet Take 1 tablet (10 mg total) by mouth at bedtime.  . DULoxetine (CYMBALTA) 30 MG capsule TAKE 1 CAPSULE DAILY IN THE EVENING  . furosemide (LASIX) 20 MG tablet Take 20 mg by mouth daily as needed.   . gabapentin (NEURONTIN) 600 MG tablet Take 600 mg by mouth 2 (two) times daily.  [name] glucose monitoring kit (FREESTYLE) monitoring kit 1 each by Does not apply route as needed for other.  . HYDROcodone-acetaminophen (NORCO/VICODIN) 5-325 MG tablet Take 1 tablet by mouth every 6 (six) hours as needed for moderate pain.  [name] HYDROcodone-homatropine  (HYDROMET) 5-1.5 MG/5ML syrup Take 5 mLs by mouth every 6 (six) hours as needed.  [name] ipratropium (ATROVENT) 0.03 % nasal spray Place 2 sprays into both nostrils every 12 (twelve) hours.  [name] JARDIANCE 10 MG TABS tablet TAKE 1 TABLET DAILY  . levocetirizine (XYZAL) 5 MG tablet Take 5 mg by mouth every evening.   [name] levothyroxine (SYNTHROID) 50 MCG tablet TAKE 1 TABLET DAILY  . lidocaine (LIDODERM) 5 % Place 3 patches onto the skin daily. May use 1-3 patches at one time, Remove & Discard patch within 12 hours or as directed by MD  . losartan (COZAAR) 50 MG tablet Take 50 mg by mouth 2 (two) times daily.   . Magnesium 250 MG TABS Take 1 tablet by mouth daily.  . methocarbamol (ROBAXIN) 750 MG tablet Take 750 mg by mouth 3 (three) times daily as needed for muscle spasms.   . mirabegron ER (MYRBETRIQ) 50 MG TB24 tablet Take 50 mg by mouth daily.  . montelukast (SINGULAIR) 10 MG tablet Take 10 mg by mouth at bedtime.  . Multiple Vitamin (MULTIVITAMIN) tablet Take 1 tablet by mouth daily.  . nitroGLYCERIN (NITROSTAT) 0.4 MG SL tablet Place 0.4 mg under the tongue every 5 (five) minutes as needed for chest pain.   . Omega-3 Fatty Acids (FISH OIL) 1200 MG CAPS Take 1,200 mg by mouth.   [name] OZEMPIC, 0.25 OR 0.5 MG/DOSE, 2 MG/1.5ML SOPN INJECT 0.$RemoveBefor'5MG'gfVsvEKVTehV$  UNDER THE SKIN EVERY WEEK  .  pantoprazole (PROTONIX) 40 MG tablet TAKE 1 TABLET DAILY  . pramipexole (MIRAPEX) 0.5 MG tablet Take 2 tablets (1 mg total) by mouth in the morning and at bedtime.  . rivaroxaban (XARELTO) 10 MG TABS tablet Take 10 mg by mouth daily.  . solifenacin (VESICARE) 5 MG tablet Take 1 tablet (5 mg total) by mouth daily.  . vitamin B-12 (CYANOCOBALAMIN) 1000 MCG tablet Take 1,000 mcg by mouth daily.   No facility-administered encounter medications on file as of 02/17/2020.   Current Diagnosis/Assessment:   Goals Addressed            This Visit's Progress   . Pharmacy Care Plan       CARE PLAN ENTRY (see longitudinal plan of care for  additional care plan information)  Current Barriers:  . Chronic Disease Management support, education, and care coordination needs related to Hypertension, Hyperlipidemia, and Diabetes and OAB.   Hypertension BP Readings from Last 3 Encounters:  12/14/19 110/60  11/24/19 120/75  01/12/19 102/60   . Pharmacist Clinical Goal(s): o Over the next 180 days, patient will work with PharmD and providers to maintain BP goal <130/80 . Current regimen:   Carvedilol 6.25 mg twice daily  Furosemide 20 mg daily prn  Losartan 50 mg twice daily . Interventions: o Provided dietary and exercise recommendations . Patient self care activities - Over the next 180 days, patient will: o Check BP periodically and if symptomatic document, and provide at future appointments o Ensure daily salt intake < 2300 mg/day o Try to exercise for 30 minutes 5 times weekly    Hyperlipidemia Lab Results  Component Value Date/Time   LDLCALC 83 12/14/2019 04:25 PM   . Pharmacist Clinical Goal(s): o Over the next 180 days, patient will work with PharmD and providers to maintain LDL goal < 70 . Current regimen:  . Atorvastatin 20 mg daily . Omega-3 Fatty acids 1200 mg by mouth daily . Interventions: o Provided dietary and exercise recommendations o Encouraged to start diet plan from magazine with goal of losing weight and eliminating medication. . Patient self care activities - Over the next 180 days, patient will: o Try to exercise for 30 minutes 5 times weekly  - Start slow with 1-2 days per week and work up to [place] from health magazine with goal of losing 15-20 lbs.  Diabetes Lab Results  Component Value Date/Time   HGBA1C 6.8 (H) 12/14/2019 04:25 PM   HGBA1C 6.1 (H) 11/18/2018 10:57 AM   . Pharmacist Clinical Goal(s): o Over the next 120 days, patient will work with PharmD and providers to maintain A1c goal <7% . Current regimen:   Jardiance 10 mg daily  Ozempic 0.5 mg under the  skin every week . Interventions: o Provided dietary and exercise recommendations o Encouraged to make changes with diet from magazine program found on his own. . Patient self care activities - Over the next 180 days, patient will: o Check blood sugar periodically and if symptomatic, document, and provide at future appointments o Contact provider with any episodes of hypoglycemia o Try to exercise for 30 minutes 5 times weekly, begin dietary changes and other lifestyle changes    Please see past updates related to this goal by clicking on the "Past Updates" button in the selected goal        Hyperlipidemia    LDL goal < 70  Lipid Panel     Component Value Date/Time   CHOL 166 12/14/2019 1625  TRIG 201 (H) 12/14/2019 1625   HDL 49 12/14/2019 1625   LDLCALC 83 12/14/2019 1625    Hepatic Function Latest Ref Rng & Units 12/14/2019 11/18/2018 03/17/2018  Total Protein 6.0 - 8.5 g/dL 6.7 6.8 6.6  Albumin 3.7 - 4.7 g/dL 3.9 4.1 4.0  AST 0 - 40 IU/L $Remov'22 20 20  'XkddhT$ ALT 0 - 44 IU/L $Remov'20 21 16  'YuWzpj$ Alk Phosphatase 48 - 121 IU/L 66 70 63  Total Bilirubin 0.0 - 1.2 mg/dL 0.6 0.3 0.6    The 10-year ASCVD risk score Mikey Bussing DC Jr., et al., 2013) is: 48.8%   Values used to calculate the score:     Age: 32 years     Sex: Male     Is Non-Hispanic African American: No     Diabetic: Yes     Tobacco smoker: No     Systolic Blood Pressure: 419 mmHg     Is BP treated: Yes     HDL Cholesterol: 49 mg/dL     Total Cholesterol: 166 mg/dL   Patient has failed these meds in past: niacin Patient is currently controlled on the following medications:  . Aspirin 81 mg daily . Atorvastatin 20 mg daily . Omega-3 Fatty acids 1200 mg by mouth daily  We discussed:    Goal triglycerides <150  Re-emphasized the need for diet modifications and exercise Diet extensively He reports he is about to completely change the way he eats.  Mentions a diet from a health magazine that he is going to try and follow.  Goal is  to lose 15-20 lbs. Exercise extensively Denies exercise at this time. He wants to and knows he needs to just cannot find the motivation at the moment.  Denies myalgias.  Plan Continue current medications, initiate exercise plan, begin dietary modifications with low fatty and fried foods.     Diabetes   Recent Relevant Labs: Lab Results  Component Value Date/Time   HGBA1C 6.8 (H) 12/14/2019 04:25 PM   HGBA1C 6.1 (H) 11/18/2018 10:57 AM   MICROALBUR 30 07/30/2018 12:32 PM    Checking BG: every couple of days  No logs available to get report of recent blood sugars.  Patient has failed these meds in past: Farxiga, [place] Patient is currently controlled on the following medications:   Jardiance 10 mg daily  Ozempic 0.5 mg under the skin every week  Last diabetic Foot exam: Not on file Last diabetic Eye exam:  Lab Results  Component Value Date/Time   HMDIABEYEEXA No Retinopathy 04/26/2019 12:00 AM    We discussed:  Jardiance and Ozempic still affordable He reports he is on the verge of a big dietary change. Discussed A1c trending up and the need for changes to diet and exercise. Still adherent with current medication and reports no issues. Recommended physical activity even if very moderate to go a long with his dietary changes.  His goal is to lose weight and get off of some of the medication.  Plan Continue current medications, recommend diet changes and initiation of exercise plan.  Hypertension   BP today is:  <130/80  Office blood pressures are  BP Readings from Last 3 Encounters:  12/14/19 110/60  11/24/19 120/75  01/12/19 102/60   Patient has failed these meds in the past: lisinopril  Patient is currently controlled on the following medications:   Carvedilol 6.25 mg twice daily  Furosemide 20 mg daily prn  Losartan 50 mg twice daily  Patient checks BP at home infrequently  No logs available today.  We discussed: . Diet and exercise  extensively  Continues to take medications as directed  Denies dizziness/headache  Plan Continue current medications, exercise as above   AFIB   Patient is currently rhythm controlled.  Patient has failed these meds in past: N/A Patient is currently controlled on the following medications:  . Amiodarone 100 mg daily  . Xarelto 10 mg daily  We discussed:   Denies bleeding or bruising  Plan Continue current medications   OAB/Stress Incontinence   Patient has failed these meds in past: N/A Patient is currently controlled on the following medications:  [name] Myrbetriq 50 mg daily . Vesicare 5 mg daily   We discussed:    Remains on both medications for OAB  He reports still having accidents when he stands up and puts pressure on his bladder.  Interested in increasing dose of Vesicare to $RemoveBef'10mg'zOjOunLXII$ .  Will consult with PCP on appropriateness of dose increase given he is also on Myrbetriq.  Plan Continue current medications,  Discuss dose titration of Vesicare with PCP.   Vaccines   Reviewed and discussed patient's vaccination history.    Immunization History  Administered Date(s) Administered  . H1N1 07/04/2008  . Influenza Split 02/20/2014  . Influenza, High Dose Seasonal PF 12/30/2018  . Influenza, Seasonal, Injecte, Preservative Fre 04/28/2015  . Influenza,inj,Quad PF,6+ Mos 05/04/2018  . Moderna SARS-COVID-2 Vaccination 06/30/2019, 07/21/2019  . Pneumococcal Conjugate-13 03/10/2014  . Pneumococcal Polysaccharide-23 08/31/2014  . Pneumococcal-Unspecified 04/10/2010, 08/31/2014  . Tdap 03/10/2014, 11/03/2015   Plan Recommend flu vaccine in office.  Follow up: 4 month phone visit  Beverly Milch, PharmD Clinical Pharmacist [facility] [phone]

## 2020-02-18 ENCOUNTER — Ambulatory Visit (INDEPENDENT_AMBULATORY_CARE_PROVIDER_SITE_OTHER): Payer: Medicare Other

## 2020-02-18 DIAGNOSIS — Z23 Encounter for immunization: Secondary | ICD-10-CM

## 2020-02-18 NOTE — Patient Instructions (Addendum)
Visit Information  Goals Addressed            This Visit's Progress   . Pharmacy Care Plan       CARE PLAN ENTRY (see longitudinal plan of care for additional care plan information)  Current Barriers:  . Chronic Disease Management support, education, and care coordination needs related to Hypertension, Hyperlipidemia, and Diabetes and OAB.   Hypertension BP Readings from Last 3 Encounters:  12/14/19 110/60  11/24/19 120/75  01/12/19 102/60   . Pharmacist Clinical Goal(s): o Over the next 180 days, patient will work with PharmD and providers to maintain BP goal <130/80 . Current regimen:   Carvedilol 6.25 mg twice daily  Furosemide 20 mg daily prn  Losartan 50 mg twice daily . Interventions: o Provided dietary and exercise recommendations . Patient self care activities - Over the next 180 days, patient will: o Check BP periodically and if symptomatic document, and provide at future appointments o Ensure daily salt intake < 2300 mg/day o Try to exercise for 30 minutes 5 times weekly    Hyperlipidemia Lab Results  Component Value Date/Time   LDLCALC 83 12/14/2019 04:25 PM   . Pharmacist Clinical Goal(s): o Over the next 180 days, patient will work with PharmD and providers to maintain LDL goal < 70 . Current regimen:  . Atorvastatin 20 mg daily . Omega-3 Fatty acids 1200 mg by mouth daily . Interventions: o Provided dietary and exercise recommendations o Encouraged to start diet plan from magazine with goal of losing weight and eliminating medication. . Patient self care activities - Over the next 180 days, patient will: o Try to exercise for 30 minutes 5 times weekly  - Start slow with 1-2 days per week and work up to [place] from health magazine with goal of losing 15-20 lbs.  Diabetes Lab Results  Component Value Date/Time   HGBA1C 6.8 (H) 12/14/2019 04:25 PM   HGBA1C 6.1 (H) 11/18/2018 10:57 AM   . Pharmacist Clinical Goal(s): o Over the  next 120 days, patient will work with PharmD and providers to maintain A1c goal <7% . Current regimen:   Jardiance 10 mg daily  Ozempic 0.5 mg under the skin every week . Interventions: o Provided dietary and exercise recommendations o Encouraged to make changes with diet from magazine program found on his own. . Patient self care activities - Over the next 180 days, patient will: o Check blood sugar periodically and if symptomatic, document, and provide at future appointments o Contact provider with any episodes of hypoglycemia o Try to exercise for 30 minutes 5 times weekly, begin dietary changes and other lifestyle changes    Please see past updates related to this goal by clicking on the "Past Updates" button in the selected goal         The patient verbalized understanding of instructions provided today and agreed to receive a mailed copy of patient instruction and/or educational materials.  Telephone follow up appointment with pharmacy team member scheduled for: 6 months  Beverly Milch, PharmD Clinical Pharmacist [facility] [phone]   Dyslipidemia Dyslipidemia is an imbalance of waxy, fat-like substances (lipids) in the blood. The body needs lipids in small amounts. Dyslipidemia often involves a high level of cholesterol or triglycerides, which are types of lipids. Common forms of dyslipidemia include:  High levels of LDL cholesterol. LDL is the type of cholesterol that causes fatty deposits (plaques) to build up in the blood vessels that  carry blood away from your heart (arteries).  Low levels of HDL cholesterol. HDL cholesterol is the type of cholesterol that protects against heart disease. High levels of HDL remove the LDL buildup from arteries.  High levels of triglycerides. Triglycerides are a fatty substance in the blood that is linked to a buildup of plaques in the arteries. What are the causes? Primary dyslipidemia is caused by changes  (mutations) in genes that are passed down through families (inherited). These mutations cause several types of dyslipidemia. Secondary dyslipidemia is caused by lifestyle choices and diseases that lead to dyslipidemia, such as:  Eating a diet that is high in animal fat.  Not getting enough exercise.  Having diabetes, kidney disease, liver disease, or thyroid disease.  Drinking large amounts of alcohol.  Using certain medicines. What increases the risk? You are more likely to develop this condition if you are an older man or if you are a woman who has gone through menopause. Other risk factors include:  Having a family history of dyslipidemia.  Taking certain medicines, including birth control pills, steroids, some diuretics, and beta-blockers.  Smoking cigarettes.  Eating a high-fat diet.  Having certain medical conditions such as diabetes, polycystic ovary syndrome (PCOS), kidney disease, liver disease, or hypothyroidism.  Not exercising regularly.  Being overweight or obese with too much belly fat. What are the signs or symptoms? In most cases, dyslipidemia does not usually cause any symptoms. In severe cases, very high lipid levels can cause:  Fatty bumps under the skin (xanthomas).  White or gray ring around the black center (pupil) of the eye. Very high triglyceride levels can cause inflammation of the pancreas (pancreatitis). How is this diagnosed? Your health care provider may diagnose dyslipidemia based on a routine blood test (fasting blood test). Because most people do not have symptoms of the condition, this blood testing (lipid profile) is done on adults age 57 and older and is repeated every 5 years. This test checks:  Total cholesterol. This measures the total amount of cholesterol in your blood, including LDL cholesterol, HDL cholesterol, and triglycerides. A healthy number is below 200.  LDL cholesterol. The target number for LDL cholesterol is different for  each person, depending on individual risk factors. Ask your health care provider what your LDL cholesterol should be.  HDL cholesterol. An HDL level of 60 or higher is best because it helps to protect against heart disease. A number below 74 for men or below 57 for women increases the risk for heart disease.  Triglycerides. A healthy triglyceride number is below 150. If your lipid profile is abnormal, your health care provider may do other blood tests. How is this treated? Treatment depends on the type of dyslipidemia that you have and your other risk factors for heart disease and stroke. Your health care provider will have a target range for your lipid levels based on this information. For many people, this condition may be treated by lifestyle changes, such as diet and exercise. Your health care provider may recommend that you:  Get regular exercise.  Make changes to your diet.  Quit smoking if you smoke. If diet changes and exercise do not help you reach your goals, your health care provider may also prescribe medicine to lower lipids. The most commonly prescribed type of medicine lowers your LDL cholesterol (statin drug). If you have a high triglyceride level, your provider may prescribe another type of drug (fibrate) or an omega-3 fish oil supplement, or both. Follow these instructions  at home:  Eating and drinking  Follow instructions from your health care provider or dietitian about eating or drinking restrictions.  Eat a healthy diet as told by your health care provider. This can help you reach and maintain a healthy weight, lower your LDL cholesterol, and raise your HDL cholesterol. This may include: ? Limiting your calories, if you are overweight. ? Eating more fruits, vegetables, whole grains, fish, and lean meats. ? Limiting saturated fat, trans fat, and cholesterol.  If you drink alcohol: ? Limit how much you use. ? Be aware of how much alcohol is in your drink. In the [place]  one drink equals one 12 oz bottle of beer (355 mL), one 5 oz glass of wine (148 mL), or one 1 oz glass of hard liquor (44 mL).  Do not drink alcohol if: ? Your health care provider tells you not to drink. ? You are pregnant, may be pregnant, or are planning to become pregnant. Activity  Get regular exercise. Start an exercise and strength training program as told by your health care provider. Ask your health care provider what activities are safe for you. Your health care provider may recommend: ? 30 minutes of aerobic activity 4-6 days a week. Brisk walking is an example of aerobic activity. ? Strength training 2 days a week. General instructions  Do not use any products that contain nicotine or tobacco, such as cigarettes, e-cigarettes, and chewing tobacco. If you need help quitting, ask your health care provider.  Take over-the-counter and prescription medicines only as told by your health care provider. This includes supplements.  Keep all follow-up visits as told by your health care provider. Contact a health care provider if:  You are: ? Having trouble sticking to your exercise or diet plan. ? Struggling to quit smoking or control your use of alcohol. Summary  Dyslipidemia often involves a high level of cholesterol or triglycerides, which are types of lipids.  Treatment depends on the type of dyslipidemia that you have and your other risk factors for heart disease and stroke.  For many people, treatment starts with lifestyle changes, such as diet and exercise.  Your health care provider may prescribe medicine to lower lipids. This information is not intended to replace advice given to you by your health care provider. Make sure you discuss any questions you have with your health care provider. Document Revised: 12/01/2017 Document Reviewed: 11/07/2017 Elsevier Patient Education  [employer].

## 2020-02-18 NOTE — Progress Notes (Signed)
   Covid-19 Vaccination Clinic  Name:  Franklin Thornton    MRN: [id number] DOB: Oct 26, 1945  02/18/2020  Franklin Thornton was observed post Covid-19 immunization for 15 minutes without incident. He was provided with Vaccine Information Sheet and instruction to access the V-Safe system.   Franklin Thornton was instructed to call 911 with any severe reactions post vaccine: [name] Difficulty breathing  . Swelling of face and throat  . A fast heartbeat  . A bad rash all over body  . Dizziness and weakness

## 2020-03-14 ENCOUNTER — Other Ambulatory Visit: Payer: Self-pay

## 2020-03-14 ENCOUNTER — Ambulatory Visit (INDEPENDENT_AMBULATORY_CARE_PROVIDER_SITE_OTHER): Payer: Medicare Other | Admitting: Internal Medicine

## 2020-03-14 ENCOUNTER — Encounter: Payer: Self-pay | Admitting: Internal Medicine

## 2020-03-14 ENCOUNTER — Ambulatory Visit: Payer: Medicare Other | Admitting: Internal Medicine

## 2020-03-14 VITALS — BP 120/74 | HR 69 | Temp 97.7°F | Ht 67.7 in | Wt 242.6 lb

## 2020-03-14 DIAGNOSIS — N182 Chronic kidney disease, stage 2 (mild): Secondary | ICD-10-CM | POA: Diagnosis not present

## 2020-03-14 DIAGNOSIS — I1310 Hypertensive heart and chronic kidney disease without heart failure, with stage 1 through stage 4 chronic kidney disease, or unspecified chronic kidney disease: Secondary | ICD-10-CM | POA: Diagnosis not present

## 2020-03-14 DIAGNOSIS — E1122 Type 2 diabetes mellitus with diabetic chronic kidney disease: Secondary | ICD-10-CM | POA: Diagnosis not present

## 2020-03-14 DIAGNOSIS — Z23 Encounter for immunization: Secondary | ICD-10-CM

## 2020-03-14 DIAGNOSIS — I700 Atherosclerosis of aorta: Secondary | ICD-10-CM

## 2020-03-14 DIAGNOSIS — L03119 Cellulitis of unspecified part of limb: Secondary | ICD-10-CM

## 2020-03-14 DIAGNOSIS — Z6837 Body mass index (BMI) 37.0-37.9, adult: Secondary | ICD-10-CM

## 2020-03-14 DIAGNOSIS — E785 Hyperlipidemia, unspecified: Secondary | ICD-10-CM

## 2020-03-14 MED ORDER — AMOXICILLIN-POT CLAVULANATE 875-125 MG PO TABS: 1.0000 | ORAL_TABLET | Freq: Two times a day (BID) | ORAL | 0 refills | Status: AC

## 2020-03-14 NOTE — Patient Instructions (Signed)
Diabetes Mellitus and Foot Care Foot care is an important part of your health, especially when you have diabetes. Diabetes may cause you to have problems because of poor blood flow (circulation) to your feet and legs, which can cause your skin to:  Become thinner and drier.  Break more easily.  Heal more slowly.  Peel and crack. You may also have nerve damage (neuropathy) in your legs and feet, causing decreased feeling in them. This means that you may not notice minor injuries to your feet that could lead to more serious problems. Noticing and addressing any potential problems early is the best way to prevent future foot problems. How to care for your feet Foot hygiene  Wash your feet daily with warm water and mild soap. Do not use hot water. Then, pat your feet and the areas between your toes until they are completely dry. Do not soak your feet as this can dry your skin.  Trim your toenails straight across. Do not dig under them or around the cuticle. File the edges of your nails with an emery board or nail file.  Apply a moisturizing lotion or petroleum jelly to the skin on your feet and to dry, brittle toenails. Use lotion that does not contain alcohol and is unscented. Do not apply lotion between your toes. Shoes and socks  Wear clean socks or stockings every day. Make sure they are not too tight. Do not wear knee-high stockings since they may decrease blood flow to your legs.  Wear shoes that fit properly and have enough cushioning. Always look in your shoes before you put them on to be sure there are no objects inside.  To break in new shoes, wear them for just a few hours a day. This prevents injuries on your feet. Wounds, scrapes, corns, and calluses  Check your feet daily for blisters, cuts, bruises, sores, and redness. If you cannot see the bottom of your feet, use a mirror or ask someone for help.  Do not cut corns or calluses or try to remove them with medicine.  If you  find a minor scrape, cut, or break in the skin on your feet, keep it and the skin around it clean and dry. You may clean these areas with mild soap and water. Do not clean the area with peroxide, alcohol, or iodine.  If you have a wound, scrape, corn, or callus on your foot, look at it several times a day to make sure it is healing and not infected. Check for: ? Redness, swelling, or pain. ? Fluid or blood. ? Warmth. ? Pus or a bad smell. General instructions  Do not cross your legs. This may decrease blood flow to your feet.  Do not use heating pads or hot water bottles on your feet. They may burn your skin. If you have lost feeling in your feet or legs, you may not know this is happening until it is too late.  Protect your feet from hot and cold by wearing shoes, such as at the beach or on hot pavement.  Schedule a complete foot exam at least once a year (annually) or more often if you have foot problems. If you have foot problems, report any cuts, sores, or bruises to your health care provider immediately. Contact a health care provider if:  You have a medical condition that increases your risk of infection and you have any cuts, sores, or bruises on your feet.  You have an injury that is not   healing.  You have redness on your legs or feet.  You feel burning or tingling in your legs or feet.  You have pain or cramps in your legs and feet.  Your legs or feet are numb.  Your feet always feel cold.  You have pain around a toenail. Get help right away if:  You have a wound, scrape, corn, or callus on your foot and: ? You have pain, swelling, or redness that gets worse. ? You have fluid or blood coming from the wound, scrape, corn, or callus. ? Your wound, scrape, corn, or callus feels warm to the touch. ? You have pus or a bad smell coming from the wound, scrape, corn, or callus. ? You have a fever. ? You have a red line going up your leg. Summary  Check your feet every day  for cuts, sores, red spots, swelling, and blisters.  Moisturize feet and legs daily.  Wear shoes that fit properly and have enough cushioning.  If you have foot problems, report any cuts, sores, or bruises to your health care provider immediately.  Schedule a complete foot exam at least once a year (annually) or more often if you have foot problems. This information is not intended to replace advice given to you by your health care provider. Make sure you discuss any questions you have with your health care provider. Document Revised: 12/30/2018 Document Reviewed: 05/10/2016 Elsevier Patient Education  2020 [employer]  

## 2020-03-14 NOTE — Progress Notes (Signed)
I,Franklin Thornton,acting as a Education administrator for Franklin Greenland, MD.,have documented all relevant documentation on the behalf of Franklin Greenland, MD,as directed by  Franklin Greenland, MD while in the presence of Franklin Greenland, MD.  This visit occurred during the SARS-CoV-2 public health emergency.  Safety protocols were in place, including screening questions prior to the visit, additional usage of staff PPE, and extensive cleaning of exam room while observing appropriate contact time as indicated for disinfecting solutions.  Subjective:     Patient ID: Franklin Thornton , male    DOB: 1945-11-24 , 74 y.o.   MRN: [id number]   Chief Complaint  Patient presents with  . Diabetes  . Hypertension    HPI  Patient is here for diabetes and hypertension follow up. He reports compliance with meds.  He recently returned from trip to [place] with his wife.   Diabetes He presents for his follow-up diabetic visit. He has type 2 diabetes mellitus. His disease course has been improving. Pertinent negatives for diabetes include no blurred vision and no chest pain. There are no hypoglycemic complications. Risk factors for coronary artery disease include diabetes mellitus, dyslipidemia, hypertension, male sex, obesity and sedentary lifestyle. His breakfast blood glucose is taken between 9-10 am. His breakfast blood glucose range is generally 90-110 mg/dl. An ACE inhibitor/angiotensin II receptor blocker is being taken. Eye exam is current.  Hypertension This is a chronic problem. The current episode started more than 1 year ago. Pertinent negatives include no blurred vision, chest pain, palpitations or shortness of breath.     Past Medical History:  Diagnosis Date  . Atrial fibrillation ([place])   . Diabetes mellitus without complication ([place])   . Peripheral vascular disease ([place])   . Prostate cancer ([place])   . Uvular edema 07/17/2015     Family History  Problem Relation Age of Onset  . Lung cancer Mother    . Diabetes Mother   . Esophageal cancer Father   . Hypertension Sister   . Breast cancer Sister   . Healthy Son   . Healthy Daughter      Current Outpatient Medications:  .  albuterol (PROVENTIL HFA;VENTOLIN HFA) 108 (90 Base) MCG/ACT inhaler, Inhale 2 puffs into the lungs every 6 (six) hours as needed for wheezing or shortness of breath., Disp: , Rfl:  .  albuterol (PROVENTIL) (2.5 MG/3ML) 0.083% nebulizer solution, Take 3 mLs (2.5 mg total) by nebulization every 6 (six) hours as needed for wheezing or shortness of breath., Disp: 75 mL, Rfl: 3 .  amiodarone (PACERONE) 100 MG tablet, Take 100 mg by mouth daily. , Disp: , Rfl:  .  aspirin 81 MG tablet, Take 81 mg by mouth daily., Disp: , Rfl:  .  atorvastatin (LIPITOR) 20 MG tablet, TAKE 1 TABLET DAILY, Disp: 90 tablet, Rfl: 3 .  b complex vitamins capsule, Take 1 capsule by mouth daily., Disp: , Rfl:  .  budesonide-formoterol (SYMBICORT) 160-4.5 MCG/ACT inhaler, Inhale 2 puffs into the lungs 2 (two) times daily. , Disp: , Rfl:  .  carvedilol (COREG) 6.25 MG tablet, Take 6.25 mg by mouth 2 (two) times daily with a meal., Disp: , Rfl:  .  Cholecalciferol (VITAMIN D) 2000 units tablet, Take 2,000 Units by mouth daily., Disp: , Rfl:  .  diphenhydrAMINE (BENADRYL) 25 MG tablet, Take 25 mg by mouth every 6 (six) hours as needed for itching. , Disp: , Rfl:  .  donepezil (ARICEPT) 10 MG tablet, Take 1 tablet (  10 mg total) by mouth at bedtime., Disp: 90 tablet, Rfl: 1 .  DULoxetine (CYMBALTA) 30 MG capsule, TAKE 1 CAPSULE DAILY IN THE EVENING, Disp: 90 capsule, Rfl: 3 .  furosemide (LASIX) 20 MG tablet, Take 20 mg by mouth daily as needed. , Disp: , Rfl:  .  gabapentin (NEURONTIN) 600 MG tablet, Take 600 mg by mouth 2 (two) times daily., Disp: , Rfl:  .  glucose monitoring kit (FREESTYLE) monitoring kit, 1 each by Does not apply route as needed for other., Disp: , Rfl:  .  HYDROcodone-acetaminophen (NORCO/VICODIN) 5-325 MG tablet, Take 1 tablet by  mouth every 6 (six) hours as needed for moderate pain., Disp: , Rfl:  .  HYDROcodone-homatropine (HYDROMET) 5-1.5 MG/5ML syrup, Take 5 mLs by mouth every 6 (six) hours as needed., Disp: 120 mL, Rfl: 0 .  ipratropium (ATROVENT) 0.03 % nasal spray, Place 2 sprays into both nostrils every 12 (twelve) hours., Disp: , Rfl:  .  JARDIANCE 10 MG TABS tablet, TAKE 1 TABLET DAILY, Disp: 90 tablet, Rfl: 3 .  levocetirizine (XYZAL) 5 MG tablet, Take 5 mg by mouth every evening. , Disp: , Rfl:  .  levothyroxine (SYNTHROID) 50 MCG tablet, TAKE 1 TABLET DAILY, Disp: 90 tablet, Rfl: 3 .  lidocaine (LIDODERM) 5 %, Place 3 patches onto the skin daily. May use 1-3 patches at one time, Remove & Discard patch within 12 hours or as directed by MD, Disp: 90 patch, Rfl: 0 .  losartan (COZAAR) 50 MG tablet, Take 50 mg by mouth 2 (two) times daily. , Disp: , Rfl:  .  Magnesium 250 MG TABS, Take 1 tablet by mouth daily., Disp: , Rfl:  .  methocarbamol (ROBAXIN) 750 MG tablet, Take 750 mg by mouth 3 (three) times daily as needed for muscle spasms. , Disp: , Rfl:  .  mirabegron ER (MYRBETRIQ) 50 MG TB24 tablet, Take 50 mg by mouth daily., Disp: , Rfl:  .  montelukast (SINGULAIR) 10 MG tablet, Take 10 mg by mouth at bedtime., Disp: , Rfl:  .  Multiple Vitamin (MULTIVITAMIN) tablet, Take 1 tablet by mouth daily., Disp: , Rfl:  .  nitroGLYCERIN (NITROSTAT) 0.4 MG SL tablet, Place 0.4 mg under the tongue every 5 (five) minutes as needed for chest pain. , Disp: , Rfl:  .  Omega-3 Fatty Acids (FISH OIL) 1200 MG CAPS, Take 1,200 mg by mouth. , Disp: , Rfl:  .  OZEMPIC, 0.25 OR 0.5 MG/DOSE, 2 MG/1.5ML SOPN, INJECT 0.5MG UNDER THE SKIN EVERY WEEK, Disp: 45 mL, Rfl: 3 .  pantoprazole (PROTONIX) 40 MG tablet, TAKE 1 TABLET DAILY, Disp: 90 tablet, Rfl: 3 .  pramipexole (MIRAPEX) 0.5 MG tablet, Take 2 tablets (1 mg total) by mouth in the morning and at bedtime., Disp: 90 tablet, Rfl: 1 .  rivaroxaban (XARELTO) 10 MG TABS tablet, Take 10 mg  by mouth daily., Disp: , Rfl:  .  solifenacin (VESICARE) 5 MG tablet, Take 1 tablet (5 mg total) by mouth daily., Disp: 90 tablet, Rfl: 1 .  vitamin B-12 (CYANOCOBALAMIN) 1000 MCG tablet, Take 1,000 mcg by mouth daily., Disp: , Rfl:    Allergies  Allergen Reactions  . No Known Allergies      Review of Systems  Constitutional: Negative.   Eyes: Negative for blurred vision.  Respiratory: Negative.  Negative for shortness of breath.   Cardiovascular: Negative.  Negative for chest pain and palpitations.  Gastrointestinal: Negative.   Skin:  States both of his legs are red/swollen. Denies fever/chills.   Neurological: Negative.   Psychiatric/Behavioral: Negative.      Today's Vitals   03/14/20 1223  BP: 120/74  Pulse: 69  Temp: 97.7 F (36.5 C)  TempSrc: Oral  Weight: 242 lb 9.6 oz (110 kg)  Height: 5' 7.7" (1.72 m)  PainSc: 2   PainLoc: Back   Body mass index is 37.21 kg/m.  Wt Readings from Last 3 Encounters:  03/14/20 242 lb 9.6 oz (110 kg)  12/14/19 240 lb (108.9 kg)  11/24/19 235 lb (106.6 kg)   Objective:  Physical Exam Vitals and nursing note reviewed.  Constitutional:      Appearance: Normal appearance. He is obese.  HENT:     Head: Normocephalic and atraumatic.     Nose:     Comments: Deferred masked    Mouth/Throat:     Comments: Deferred, masked Eyes:     Extraocular Movements: Extraocular movements intact.  Cardiovascular:     Rate and Rhythm: Normal rate and regular rhythm.     Heart sounds: Normal heart sounds.  Pulmonary:     Effort: Pulmonary effort is normal.     Breath sounds: Normal breath sounds.  Musculoskeletal:     Right lower leg: Edema present.     Left lower leg: Edema present.  Skin:    General: Skin is warm.     Findings: Erythema present.     Comments: B/l LE edema, dusky skin b/l LE, erythema and warmth b/l  Neurological:     General: No focal deficit present.     Mental Status: He is alert.  Psychiatric:        Mood  and Affect: Mood normal.   x      Assessment And Plan:     1. Type 2 diabetes mellitus with stage 2 chronic kidney disease, without long-term current use of insulin (HCC) Comments: Chronic, I will check labs as listed below. Encouraged to follow dietary guidelines, limit intake of sugary beverages.  - Hemoglobin A1c - BMP8+EGFR  2. Hypertensive heart and renal disease with renal failure, stage 1 through stage 4 or unspecified chronic kidney disease, without heart failure Comments: Chronic, well controlled. He will continue with current meds. He is encouraged to avoid adding salt to her foods.   3. Recurrent cellulitis of lower extremity Comments: I will send rx Augmentin for him to take twice daily. I will also refer him to Wound clinic for further evaluation.  - AMB referral to wound care center  4. Atherosclerosis of aorta (HCC) Comments: Chronic, importance of statin therapy was discussed with the patient. Encouraged to follow heart healthy lifestyle.   5. Need for vaccination Comments: He was given high dose flu vaccine.  - Flu Vaccine QUAD High Dose(Fluad)  6. Class 2 severe obesity due to excess calories with serious comorbidity and body mass index (BMI) of 37.0 to 37.9 in adult [facility]) Comments: He is encouraged to strive for BMI less than 30 to decrease cardiac risk. Advised to walk 30 minutes five days per week.   He is encouraged to initially strive for BMI less than 30 to decrease cardiac risk. He is advised to exercise no less than 150 minutes per week.    Patient was given opportunity to ask questions. Patient verbalized understanding of the plan and was able to repeat key elements of the plan. All questions were answered to their satisfaction.  Franklin Greenland, MD   I, Bailey Mech  Shawnie Dapper, MD, have reviewed all documentation for this visit. The documentation on 03/26/20 for the exam, diagnosis, procedures, and orders are all accurate and complete.  THE PATIENT IS ENCOURAGED  TO PRACTICE SOCIAL DISTANCING DUE TO THE COVID-19 PANDEMIC.

## 2020-03-15 LAB — BMP8+EGFR
BUN/Creatinine Ratio: 23 (ref 10–24)
BUN: 24 mg/dL (ref 8–27)
CO2: 26 mmol/L (ref 20–29)
Calcium: 9.2 mg/dL (ref 8.6–10.2)
Chloride: 103 mmol/L (ref 96–106)
Creatinine, Ser: 1.06 mg/dL (ref 0.76–1.27)
GFR calc Af Amer: 79 mL/min/{1.73_m2} (ref >59)
GFR calc non Af Amer: 68 mL/min/{1.73_m2} (ref >59)
Glucose: 173 mg/dL — ABNORMAL HIGH (ref 65–99)
Potassium: 4.3 mmol/L (ref 3.5–5.2)
Sodium: 143 mmol/L (ref 134–144)

## 2020-03-15 LAB — HEMOGLOBIN A1C
Est. average glucose Bld gHb Est-mCnc: 154 mg/dL
Hgb A1c MFr Bld: 7 % — ABNORMAL HIGH (ref 4.8–5.6)

## 2020-03-26 DIAGNOSIS — I700 Atherosclerosis of aorta: Secondary | ICD-10-CM | POA: Insufficient documentation

## 2020-03-26 DIAGNOSIS — Z6835 Body mass index (BMI) 35.0-35.9, adult: Secondary | ICD-10-CM | POA: Insufficient documentation

## 2020-03-26 DIAGNOSIS — L03115 Cellulitis of right lower limb: Secondary | ICD-10-CM | POA: Insufficient documentation

## 2020-03-26 DIAGNOSIS — L03119 Cellulitis of unspecified part of limb: Secondary | ICD-10-CM | POA: Insufficient documentation

## 2020-03-30 DIAGNOSIS — J309 Allergic rhinitis, unspecified: Secondary | ICD-10-CM | POA: Diagnosis not present

## 2020-03-30 DIAGNOSIS — G4733 Obstructive sleep apnea (adult) (pediatric): Secondary | ICD-10-CM | POA: Diagnosis not present

## 2020-03-30 DIAGNOSIS — R498 Other voice and resonance disorders: Secondary | ICD-10-CM | POA: Diagnosis not present

## 2020-03-30 DIAGNOSIS — G2581 Restless legs syndrome: Secondary | ICD-10-CM | POA: Diagnosis not present

## 2020-03-30 DIAGNOSIS — Z7189 Other specified counseling: Secondary | ICD-10-CM | POA: Diagnosis not present

## 2020-03-30 DIAGNOSIS — R918 Other nonspecific abnormal finding of lung field: Secondary | ICD-10-CM | POA: Diagnosis not present

## 2020-03-30 DIAGNOSIS — J4520 Mild intermittent asthma, uncomplicated: Secondary | ICD-10-CM | POA: Diagnosis not present

## 2020-04-10 DIAGNOSIS — J4520 Mild intermittent asthma, uncomplicated: Secondary | ICD-10-CM | POA: Diagnosis not present

## 2020-04-10 DIAGNOSIS — R498 Other voice and resonance disorders: Secondary | ICD-10-CM | POA: Diagnosis not present

## 2020-04-11 ENCOUNTER — Telehealth: Payer: Self-pay

## 2020-04-11 NOTE — Chronic Care Management (AMB) (Signed)
Chronic Care Management Pharmacy Assistant   Name: Franklin Thornton  MRN: [id number] DOB: [birth year]  Reason for Encounter: Disease State / Diabetes , Hypertension and Lipids Adherence Call.  Patient Questions:  1.  Have you seen any other providers since your last visit?    2.  Any changes in your medicines or health?  [name]  PCP : [name], MD  Allergies:   Allergies  Allergen Reactions  . No Known Allergies     Medications: Outpatient Encounter Medications as of 04/11/2020  Medication Sig  . albuterol (PROVENTIL HFA;VENTOLIN HFA) 108 (90 Base) MCG/ACT inhaler Inhale 2 puffs into the lungs every 6 (six) hours as needed for wheezing or shortness of breath.  [name] albuterol (PROVENTIL) (2.5 MG/3ML) 0.083% nebulizer solution Take 3 mLs (2.5 mg total) by nebulization every 6 (six) hours as needed for wheezing or shortness of breath.  [name] amiodarone (PACERONE) 100 MG tablet Take 100 mg by mouth daily.   [name] aspirin 81 MG tablet Take 81 mg by mouth daily.  [name] atorvastatin (LIPITOR) 20 MG tablet TAKE 1 TABLET DAILY  . b complex vitamins capsule Take 1 capsule by mouth daily.  . budesonide-formoterol (SYMBICORT) 160-4.5 MCG/ACT inhaler Inhale 2 puffs into the lungs 2 (two) times daily.   . carvedilol (COREG) 6.25 MG tablet Take 6.25 mg by mouth 2 (two) times daily with a meal.  . Cholecalciferol (VITAMIN D) 2000 units tablet Take 2,000 Units by mouth daily.  . diphenhydrAMINE (BENADRYL) 25 MG tablet Take 25 mg by mouth every 6 (six) hours as needed for itching.   . donepezil (ARICEPT) 10 MG tablet Take 1 tablet (10 mg total) by mouth at bedtime.  . DULoxetine (CYMBALTA) 30 MG capsule TAKE 1 CAPSULE DAILY IN THE EVENING  . furosemide (LASIX) 20 MG tablet Take 20 mg by mouth daily as needed.   . gabapentin (NEURONTIN) 600 MG tablet Take 600 mg by mouth 2 (two) times daily.  [name] glucose monitoring kit (FREESTYLE) monitoring kit 1 each by Does not apply route as needed for other.  .  HYDROcodone-acetaminophen (NORCO/VICODIN) 5-325 MG tablet Take 1 tablet by mouth every 6 (six) hours as needed for moderate pain.  [name] HYDROcodone-homatropine (HYDROMET) 5-1.5 MG/5ML syrup Take 5 mLs by mouth every 6 (six) hours as needed.  [name] ipratropium (ATROVENT) 0.03 % nasal spray Place 2 sprays into both nostrils every 12 (twelve) hours.  [name] JARDIANCE 10 MG TABS tablet TAKE 1 TABLET DAILY  . levocetirizine (XYZAL) 5 MG tablet Take 5 mg by mouth every evening.   [name] levothyroxine (SYNTHROID) 50 MCG tablet TAKE 1 TABLET DAILY  . lidocaine (LIDODERM) 5 % Place 3 patches onto the skin daily. May use 1-3 patches at one time, Remove & Discard patch within 12 hours or as directed by MD  . losartan (COZAAR) 50 MG tablet Take 50 mg by mouth 2 (two) times daily.   . Magnesium 250 MG TABS Take 1 tablet by mouth daily.  . methocarbamol (ROBAXIN) 750 MG tablet Take 750 mg by mouth 3 (three) times daily as needed for muscle spasms.   . mirabegron ER (MYRBETRIQ) 50 MG TB24 tablet Take 50 mg by mouth daily.  . montelukast (SINGULAIR) 10 MG tablet Take 10 mg by mouth at bedtime.  . Multiple Vitamin (MULTIVITAMIN) tablet Take 1 tablet by mouth daily.  . nitroGLYCERIN (NITROSTAT) 0.4 MG SL tablet Place 0.4 mg under the tongue every 5 (five) minutes as needed for chest pain.   [name]  Omega-3 Fatty Acids (FISH OIL) 1200 MG CAPS Take 1,200 mg by mouth.   [name] OZEMPIC, 0.25 OR 0.5 MG/DOSE, 2 MG/1.5ML SOPN INJECT 0.5MG UNDER THE SKIN EVERY WEEK  . pantoprazole (PROTONIX) 40 MG tablet TAKE 1 TABLET DAILY  . pramipexole (MIRAPEX) 0.5 MG tablet Take 2 tablets (1 mg total) by mouth in the morning and at bedtime.  . rivaroxaban (XARELTO) 10 MG TABS tablet Take 10 mg by mouth daily.  . solifenacin (VESICARE) 5 MG tablet Take 1 tablet (5 mg total) by mouth daily.  . vitamin B-12 (CYANOCOBALAMIN) 1000 MCG tablet Take 1,000 mcg by mouth daily.   No facility-administered encounter medications on file as of 04/11/2020.    Current  Diagnosis: Patient Active Problem List   Diagnosis Date Noted  . Recurrent cellulitis of lower extremity 03/26/2020  . Atherosclerosis of aorta ([place]) 03/26/2020  . Class 2 severe obesity due to excess calories with serious comorbidity and body mass index (BMI) of 37.0 to 37.9 in adult [facility]) 03/26/2020  . PVD (peripheral vascular disease) ([place]) 12/30/2018  . Type 2 diabetes mellitus with stage 2 chronic kidney disease, without long-term current use of insulin ([place]) 06/17/2018  . 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  . Atherosclerosis of native coronary artery of native heart without angina pectoris 06/17/2018  . Arrhythmia 11/18/2017  . Hyperlipidemia 11/18/2017  . Hypertension 11/18/2017  . Varicose veins of left lower extremity with complications 38/93/7342  . Pain in both lower extremities 08/14/2015  . Globus pharyngeus 07/17/2015  . Abdominal pain 12/12/2013  . History of carcinoma in situ of prostate 12/12/2013  . Male erectile dysfunction 12/12/2013  . Personal history of prostate cancer 12/12/2013  . OAB (overactive bladder) 11/03/2013  . Malignant neoplasm of prostate ([place]) 11/03/2013  . Nocturia 11/03/2013  . Stress incontinence 11/03/2013  . Urinary incontinence 11/03/2013  . Headache associated with sexual activity 12/14/2012  . Memory loss 12/14/2012  . Myalgia and myositis 12/14/2012  . Obstructive sleep apnea 12/14/2012  . Insomnia 12/14/2012  . Partial epilepsy with impairment of consciousness, with intractable epilepsy ([place]) 12/14/2012  . Restless legs syndrome 12/14/2012  . Tension type headache 12/14/2012  . Dehydration, severe 07/11/2012  . Metabolic acidosis 87/68/1157  . Noninfective gastroenteritis and colitis 07/11/2012   . Current antihyperglycemic regimen:  o Jardiance 10 mg daily o Ozempic 0.5 mg once a week o  . What recent interventions/DTPs have been made to improve glycemic control:  Patient states he has been taking his medications  as directed by provider. . Have there been any recent hospitalizations or ED visits since last visit with CPP? No  . Patient denies hypoglycemic symptoms, including Pale, Sweaty, Shaky, Hungry, Nervous/irritable and Vision changes . Patient denies hyperglycemic symptoms, including blurry vision, excessive thirst, fatigue, polyuria and weakness  . How often are you checking your blood sugar?  Patient states he checks his blood sugar once in a while .  [name] What are your blood sugars ranging? none  o Fasting: None o Before meals:None  o After meals: None o Bedtime: None . During the week, how often does your blood glucose drop below 70? None   . Are you checking your feet daily/regularly? Yes ,Patient denies any open sores, or pain.  Adherence Review: Is the patient currently on a STATIN medication? Yes  Is the patient currently on ACE/ARB medication?  Yes Does the patient have >5 day gap between last estimated fill dates? No    Reviewed chart prior to disease  state call. Spoke with patient regarding BP  Recent Office Vitals: BP Readings from Last 3 Encounters:  03/14/20 120/74  12/14/19 110/60  11/24/19 120/75   Pulse Readings from Last 3 Encounters:  03/14/20 69  12/14/19 (!) 55  11/24/19 61    Wt Readings from Last 3 Encounters:  03/14/20 242 lb 9.6 oz (110 kg)  12/14/19 240 lb (108.9 kg)  11/24/19 235 lb (106.6 kg)     Kidney Function Lab Results  Component Value Date/Time   CREATININE 1.06 03/14/2020 02:46 PM   CREATININE 1.08 12/14/2019 04:25 PM   GFRNONAA 68 03/14/2020 02:46 PM   GFRAA 79 03/14/2020 02:46 PM    BMP Latest Ref Rng & Units 03/14/2020 12/14/2019 11/18/2018  Glucose 65 - 99 mg/dL 173(H) 92 112(H)  BUN 8 - 27 mg/dL _0 Creatinine 0.76 - 1.27 mg/dL 1.06 1.08 1.05  BUN/Creat Ratio 10 - _1 Sodium 134 - 144 mmol/L 143 141 142  Potassium 3.5 - 5.2 mmol/L 4.3 4.2 5.0  Chloride 96 - 106 mmol/L 103 102 100  CO2 20 - 29 mmol/L _2 Calcium 8.6 - 10.2 mg/dL 9.2 9.1 9.4    . Current antihypertensive regimen:    Carvedilol 6.25 mg twice daily  Furosemide 20 mg daily prn  Losartan 50 mg twice daily   . How often are you checking your Blood Pressure? Patient states he only checks his blood pressure once in a while  . Current home BP readings: None  . What recent interventions/DTPs have been made by any provider to improve Blood Pressure control since last CPP Visit: Patient states he takes his medications as directed by provider.  . Any recent hospitalizations or ED visits since last visit with CPP? No  . What diet changes have been made to improve Blood Pressure Control?  o Patient states he watches his salt and he tries to watch what he eats. o  . What exercise is being done to improve your Blood Pressure Control?  o Patient states he walks 30 minutes a day   Adherence Review: Is the patient currently on ACE/ARB medication? Yes  Does the patient have >5 day gap between last estimated fill dates? No     Comprehensive medication review performed; Spoke to patient regarding cholesterol  Lipid Panel    Component Value Date/Time   CHOL 166 12/14/2019 1625   TRIG 201 (H) 12/14/2019 1625   HDL 49 12/14/2019 1625   LDLCALC 83 12/14/2019 1625    10-year ASCVD risk score: The 10-year ASCVD risk score Mikey Bussing DC Brooke Bonito., et al., 2013) is: 41.8%   Values used to calculate the score:     Age: 48 years     Sex: Male     Is Non-Hispanic African American: No     Diabetic: Yes     Tobacco smoker: No     Systolic Blood Pressure: 323 mmHg     Is BP treated: Yes     HDL Cholesterol: 49 mg/dL     Total Cholesterol: 166 mg/dL  . Current antihyperlipidemic regimen:  o Atorvastatin 20 mg daily o Omega - 3 Fatty Acids 1200 mg by mouth daily  . Previous antihyperlipidemic medications tried: None  . ASCVD risk enhancing conditions: age >24, DM and HTN   . What recent interventions/DTPs have been made by any  provider to improve Cholesterol control since last CPP Visit: Patient states he takes his medications as directed by  provider.  . Any recent hospitalizations or ED visits since last visit with CPP? No   . What diet changes have been made to improve Cholesterol?  o Patient states that he eats heathy. o  . What exercise is being done to improve Cholesterol?  o Patient walks 30 minutes a day  Adherence Review: Does the patient have >5 day gap between last estimated fill dates? No       Goals Addressed            This Visit's Progress   . Pharmacy Care Plan   On track    CARE PLAN ENTRY (see longitudinal plan of care for additional care plan information)  Current Barriers:  . Chronic Disease Management support, education, and care coordination needs related to Hypertension, Hyperlipidemia, and Diabetes and OAB.   Hypertension BP Readings from Last 3 Encounters:  12/14/19 110/60  11/24/19 120/75  01/12/19 102/60   . Pharmacist Clinical Goal(s): o Over the next 180 days, patient will work with PharmD and providers to maintain BP goal <130/80 . Current regimen:   Carvedilol 6.25 mg twice daily  Furosemide 20 mg daily prn  Losartan 50 mg twice daily . Interventions: o Provided dietary and exercise recommendations . Patient self care activities - Over the next 180 days, patient will: o Check BP periodically and if symptomatic document, and provide at future appointments o Ensure daily salt intake < 2300 mg/day o Try to exercise for 30 minutes 5 times weekly    Hyperlipidemia Lab Results  Component Value Date/Time   LDLCALC 83 12/14/2019 04:25 PM   . Pharmacist Clinical Goal(s): o Over the next 180 days, patient will work with PharmD and providers to maintain LDL goal < 70 . Current regimen:  . Atorvastatin 20 mg daily . Omega-3 Fatty acids 1200 mg by mouth daily . Interventions: o Provided dietary and exercise recommendations o Encouraged to start diet plan  from magazine with goal of losing weight and eliminating medication. . Patient self care activities - Over the next 180 days, patient will: o Try to exercise for 30 minutes 5 times weekly  - Start slow with 1-2 days per week and work up to [place] from health magazine with goal of losing 15-20 lbs.  Diabetes Lab Results  Component Value Date/Time   HGBA1C 6.8 (H) 12/14/2019 04:25 PM   HGBA1C 6.1 (H) 11/18/2018 10:57 AM   . Pharmacist Clinical Goal(s): o Over the next 120 days, patient will work with PharmD and providers to maintain A1c goal <7% . Current regimen:   Jardiance 10 mg daily  Ozempic 0.5 mg under the skin every week . Interventions: o Provided dietary and exercise recommendations o Encouraged to make changes with diet from magazine program found on his own. . Patient self care activities - Over the next 180 days, patient will: o Check blood sugar periodically and if symptomatic, document, and provide at future appointments o Contact provider with any episodes of hypoglycemia o Try to exercise for 30 minutes 5 times weekly, begin dietary changes and other lifestyle changes    Please see past updates related to this goal by clicking on the "Past Updates" button in the selected goal         Follow-Up:  Pharmacist Review - Patient states that he has been away for one week, he has not been checking his sugars , states that it may be high , because he took wrong bottle of Ozempic and did  not get full dosage. He is on his way home now and he will check his sugar levels and will take his full dose of Ozempic tomorrow.  Orlando Penner, CPP Notified  Judithann Sheen, [place] Pharmacist Assistant [phone]

## 2020-04-24 DIAGNOSIS — C61 Malignant neoplasm of prostate: Secondary | ICD-10-CM | POA: Diagnosis not present

## 2020-04-24 DIAGNOSIS — N3941 Urge incontinence: Secondary | ICD-10-CM | POA: Diagnosis not present

## 2020-04-24 DIAGNOSIS — N400 Enlarged prostate without lower urinary tract symptoms: Secondary | ICD-10-CM | POA: Diagnosis not present

## 2020-04-24 DIAGNOSIS — E1142 Type 2 diabetes mellitus with diabetic polyneuropathy: Secondary | ICD-10-CM | POA: Diagnosis not present

## 2020-04-26 DIAGNOSIS — H04123 Dry eye syndrome of bilateral lacrimal glands: Secondary | ICD-10-CM | POA: Diagnosis not present

## 2020-04-26 DIAGNOSIS — H43812 Vitreous degeneration, left eye: Secondary | ICD-10-CM | POA: Diagnosis not present

## 2020-04-26 DIAGNOSIS — Z961 Presence of intraocular lens: Secondary | ICD-10-CM | POA: Diagnosis not present

## 2020-04-26 DIAGNOSIS — E119 Type 2 diabetes mellitus without complications: Secondary | ICD-10-CM | POA: Diagnosis not present

## 2020-04-26 DIAGNOSIS — H26492 Other secondary cataract, left eye: Secondary | ICD-10-CM | POA: Diagnosis not present

## 2020-05-01 DIAGNOSIS — I255 Ischemic cardiomyopathy: Secondary | ICD-10-CM | POA: Diagnosis not present

## 2020-05-01 DIAGNOSIS — I10 Essential (primary) hypertension: Secondary | ICD-10-CM | POA: Diagnosis not present

## 2020-05-01 DIAGNOSIS — E1165 Type 2 diabetes mellitus with hyperglycemia: Secondary | ICD-10-CM | POA: Diagnosis not present

## 2020-05-01 DIAGNOSIS — I480 Paroxysmal atrial fibrillation: Secondary | ICD-10-CM | POA: Diagnosis not present

## 2020-05-01 DIAGNOSIS — I2510 Atherosclerotic heart disease of native coronary artery without angina pectoris: Secondary | ICD-10-CM | POA: Diagnosis not present

## 2020-05-01 DIAGNOSIS — E785 Hyperlipidemia, unspecified: Secondary | ICD-10-CM | POA: Diagnosis not present

## 2020-05-04 DIAGNOSIS — N182 Chronic kidney disease, stage 2 (mild): Secondary | ICD-10-CM | POA: Diagnosis not present

## 2020-05-04 DIAGNOSIS — E1122 Type 2 diabetes mellitus with diabetic chronic kidney disease: Secondary | ICD-10-CM | POA: Diagnosis not present

## 2020-05-04 DIAGNOSIS — L97919 Non-pressure chronic ulcer of unspecified part of right lower leg with unspecified severity: Secondary | ICD-10-CM | POA: Diagnosis not present

## 2020-05-04 DIAGNOSIS — I739 Peripheral vascular disease, unspecified: Secondary | ICD-10-CM | POA: Diagnosis not present

## 2020-05-04 DIAGNOSIS — R609 Edema, unspecified: Secondary | ICD-10-CM | POA: Diagnosis not present

## 2020-05-04 DIAGNOSIS — L97929 Non-pressure chronic ulcer of unspecified part of left lower leg with unspecified severity: Secondary | ICD-10-CM | POA: Diagnosis not present

## 2020-05-04 DIAGNOSIS — I83029 Varicose veins of left lower extremity with ulcer of unspecified site: Secondary | ICD-10-CM | POA: Diagnosis not present

## 2020-05-04 DIAGNOSIS — I83019 Varicose veins of right lower extremity with ulcer of unspecified site: Secondary | ICD-10-CM | POA: Diagnosis not present

## 2020-05-04 DIAGNOSIS — E1142 Type 2 diabetes mellitus with diabetic polyneuropathy: Secondary | ICD-10-CM | POA: Diagnosis not present

## 2020-05-11 DIAGNOSIS — I83019 Varicose veins of right lower extremity with ulcer of unspecified site: Secondary | ICD-10-CM | POA: Diagnosis not present

## 2020-05-11 DIAGNOSIS — G4733 Obstructive sleep apnea (adult) (pediatric): Secondary | ICD-10-CM | POA: Diagnosis not present

## 2020-05-11 DIAGNOSIS — E1142 Type 2 diabetes mellitus with diabetic polyneuropathy: Secondary | ICD-10-CM | POA: Diagnosis not present

## 2020-05-11 DIAGNOSIS — L97929 Non-pressure chronic ulcer of unspecified part of left lower leg with unspecified severity: Secondary | ICD-10-CM | POA: Diagnosis not present

## 2020-05-11 DIAGNOSIS — N182 Chronic kidney disease, stage 2 (mild): Secondary | ICD-10-CM | POA: Diagnosis not present

## 2020-05-11 DIAGNOSIS — L97919 Non-pressure chronic ulcer of unspecified part of right lower leg with unspecified severity: Secondary | ICD-10-CM | POA: Diagnosis not present

## 2020-05-11 DIAGNOSIS — E1122 Type 2 diabetes mellitus with diabetic chronic kidney disease: Secondary | ICD-10-CM | POA: Diagnosis not present

## 2020-05-11 DIAGNOSIS — I83029 Varicose veins of left lower extremity with ulcer of unspecified site: Secondary | ICD-10-CM | POA: Diagnosis not present

## 2020-05-25 DIAGNOSIS — G40219 Localization-related (focal) (partial) symptomatic epilepsy and epileptic syndromes with complex partial seizures, intractable, without status epilepticus: Secondary | ICD-10-CM | POA: Diagnosis not present

## 2020-05-25 DIAGNOSIS — M4316 Spondylolisthesis, lumbar region: Secondary | ICD-10-CM | POA: Diagnosis not present

## 2020-05-25 DIAGNOSIS — E1142 Type 2 diabetes mellitus with diabetic polyneuropathy: Secondary | ICD-10-CM | POA: Diagnosis not present

## 2020-05-25 DIAGNOSIS — G894 Chronic pain syndrome: Secondary | ICD-10-CM | POA: Diagnosis not present

## 2020-05-25 DIAGNOSIS — G3184 Mild cognitive impairment, so stated: Secondary | ICD-10-CM | POA: Diagnosis not present

## 2020-05-25 DIAGNOSIS — G2581 Restless legs syndrome: Secondary | ICD-10-CM | POA: Diagnosis not present

## 2020-05-25 DIAGNOSIS — G479 Sleep disorder, unspecified: Secondary | ICD-10-CM | POA: Diagnosis not present

## 2020-06-01 DIAGNOSIS — I83029 Varicose veins of left lower extremity with ulcer of unspecified site: Secondary | ICD-10-CM | POA: Diagnosis not present

## 2020-06-01 DIAGNOSIS — I83019 Varicose veins of right lower extremity with ulcer of unspecified site: Secondary | ICD-10-CM | POA: Diagnosis not present

## 2020-06-01 DIAGNOSIS — E1142 Type 2 diabetes mellitus with diabetic polyneuropathy: Secondary | ICD-10-CM | POA: Diagnosis not present

## 2020-06-01 DIAGNOSIS — I739 Peripheral vascular disease, unspecified: Secondary | ICD-10-CM | POA: Diagnosis not present

## 2020-06-01 DIAGNOSIS — L97919 Non-pressure chronic ulcer of unspecified part of right lower leg with unspecified severity: Secondary | ICD-10-CM | POA: Diagnosis not present

## 2020-06-01 DIAGNOSIS — L97929 Non-pressure chronic ulcer of unspecified part of left lower leg with unspecified severity: Secondary | ICD-10-CM | POA: Diagnosis not present

## 2020-06-08 DIAGNOSIS — J301 Allergic rhinitis due to pollen: Secondary | ICD-10-CM | POA: Diagnosis not present

## 2020-06-08 DIAGNOSIS — R498 Other voice and resonance disorders: Secondary | ICD-10-CM | POA: Diagnosis not present

## 2020-06-08 DIAGNOSIS — G4733 Obstructive sleep apnea (adult) (pediatric): Secondary | ICD-10-CM | POA: Diagnosis not present

## 2020-06-08 DIAGNOSIS — J4520 Mild intermittent asthma, uncomplicated: Secondary | ICD-10-CM | POA: Diagnosis not present

## 2020-06-08 DIAGNOSIS — G2581 Restless legs syndrome: Secondary | ICD-10-CM | POA: Diagnosis not present

## 2020-06-08 DIAGNOSIS — R918 Other nonspecific abnormal finding of lung field: Secondary | ICD-10-CM | POA: Diagnosis not present

## 2020-06-13 DIAGNOSIS — J4520 Mild intermittent asthma, uncomplicated: Secondary | ICD-10-CM | POA: Diagnosis not present

## 2020-06-15 DIAGNOSIS — L97919 Non-pressure chronic ulcer of unspecified part of right lower leg with unspecified severity: Secondary | ICD-10-CM | POA: Diagnosis not present

## 2020-06-15 DIAGNOSIS — I83029 Varicose veins of left lower extremity with ulcer of unspecified site: Secondary | ICD-10-CM | POA: Diagnosis not present

## 2020-06-15 DIAGNOSIS — I83019 Varicose veins of right lower extremity with ulcer of unspecified site: Secondary | ICD-10-CM | POA: Diagnosis not present

## 2020-06-15 DIAGNOSIS — L97929 Non-pressure chronic ulcer of unspecified part of left lower leg with unspecified severity: Secondary | ICD-10-CM | POA: Diagnosis not present

## 2020-06-15 DIAGNOSIS — E1122 Type 2 diabetes mellitus with diabetic chronic kidney disease: Secondary | ICD-10-CM | POA: Diagnosis not present

## 2020-06-15 DIAGNOSIS — R609 Edema, unspecified: Secondary | ICD-10-CM | POA: Diagnosis not present

## 2020-06-15 DIAGNOSIS — N182 Chronic kidney disease, stage 2 (mild): Secondary | ICD-10-CM | POA: Diagnosis not present

## 2020-06-22 ENCOUNTER — Telehealth: Payer: Self-pay

## 2020-06-22 NOTE — Chronic Care Management (AMB) (Addendum)
Chronic Care Management Pharmacy Assistant   Name: Franklin Thornton  MRN: [id number] DOB: 1945/05/27  Reason for Encounter: Diabetes Adherence Call   Patient Questions:  1.  Have you seen any other providers since your last visit? Yes, 05/11/20-[name], [name], MD (OV). 05/04/20-[name], [name], MD (OV). 05/01/20-[name], [name] (OV). 04/27/20-[name], [name], MD (OV). 04/24/20-[name], [name] (OV).      2.  Any changes in your medicines or health? No     PCP : [name], MD  Allergies:   Allergies  Allergen Reactions   No Known Allergies     Medications: Outpatient Encounter Medications as of 06/22/2020  Medication Sig   albuterol (PROVENTIL HFA;VENTOLIN HFA) 108 (90 Base) MCG/ACT inhaler Inhale 2 puffs into the lungs every 6 (six) hours as needed for wheezing or shortness of breath.   albuterol (PROVENTIL) (2.5 MG/3ML) 0.083% nebulizer solution Take 3 mLs (2.5 mg total) by nebulization every 6 (six) hours as needed for wheezing or shortness of breath.   amiodarone (PACERONE) 100 MG tablet Take 100 mg by mouth daily.    aspirin 81 MG tablet Take 81 mg by mouth daily.   atorvastatin (LIPITOR) 20 MG tablet TAKE 1 TABLET DAILY   b complex vitamins capsule Take 1 capsule by mouth daily.   budesonide-formoterol (SYMBICORT) 160-4.5 MCG/ACT inhaler Inhale 2 puffs into the lungs 2 (two) times daily.    carvedilol (COREG) 6.25 MG tablet Take 6.25 mg by mouth 2 (two) times daily with a meal.   Cholecalciferol (VITAMIN D) 2000 units tablet Take 2,000 Units by mouth daily.   diphenhydrAMINE (BENADRYL) 25 MG tablet Take 25 mg by mouth every 6 (six) hours as needed for itching.    donepezil (ARICEPT) 10 MG tablet Take 1 tablet (10 mg total) by mouth at bedtime.   DULoxetine (CYMBALTA) 30 MG capsule TAKE 1 CAPSULE DAILY IN THE EVENING   furosemide (LASIX) 20 MG tablet Take 20 mg by mouth daily as needed.    gabapentin (NEURONTIN) 600 MG tablet Take 600 mg by mouth 2 (two) times daily.    glucose monitoring kit (FREESTYLE) monitoring kit 1 each by Does not apply route as needed for other.   HYDROcodone-acetaminophen (NORCO/VICODIN) 5-325 MG tablet Take 1 tablet by mouth every 6 (six) hours as needed for moderate pain.   HYDROcodone-homatropine (HYDROMET) 5-1.5 MG/5ML syrup Take 5 mLs by mouth every 6 (six) hours as needed.   ipratropium (ATROVENT) 0.03 % nasal spray Place 2 sprays into both nostrils every 12 (twelve) hours.   JARDIANCE 10 MG TABS tablet TAKE 1 TABLET DAILY   levocetirizine (XYZAL) 5 MG tablet Take 5 mg by mouth every evening.    levothyroxine (SYNTHROID) 50 MCG tablet TAKE 1 TABLET DAILY   lidocaine (LIDODERM) 5 % Place 3 patches onto the skin daily. May use 1-3 patches at one time, Remove & Discard patch within 12 hours or as directed by MD   losartan (COZAAR) 50 MG tablet Take 50 mg by mouth 2 (two) times daily.    Magnesium 250 MG TABS Take 1 tablet by mouth daily.   methocarbamol (ROBAXIN) 750 MG tablet Take 750 mg by mouth 3 (three) times daily as needed for muscle spasms.    mirabegron ER (MYRBETRIQ) 50 MG TB24 tablet Take 50 mg by mouth daily.   montelukast (SINGULAIR) 10 MG tablet Take 10 mg by mouth at bedtime.   Multiple Vitamin (MULTIVITAMIN) tablet Take 1 tablet by mouth daily.   nitroGLYCERIN (NITROSTAT)  0.4 MG SL tablet Place 0.4 mg under the tongue every 5 (five) minutes as needed for chest pain.    Omega-3 Fatty Acids (FISH OIL) 1200 MG CAPS Take 1,200 mg by mouth.    OZEMPIC, 0.25 OR 0.5 MG/DOSE, 2 MG/1.5ML SOPN INJECT 0.$RemoveBefor'5MG'[id number]$  UNDER THE SKIN EVERY WEEK   pantoprazole (PROTONIX) 40 MG tablet TAKE 1 TABLET DAILY   pramipexole (MIRAPEX) 0.5 MG tablet Take 2 tablets (1 mg total) by mouth in the morning and at bedtime.   rivaroxaban (XARELTO) 10 MG TABS tablet Take 10 mg by mouth daily.   solifenacin (VESICARE) 5 MG tablet Take 1 tablet (5 mg total) by mouth daily.   vitamin B-12 (CYANOCOBALAMIN) 1000 MCG tablet Take 1,000 mcg by mouth daily.   No  facility-administered encounter medications on file as of 06/22/2020.    Current Diagnosis: Patient Active Problem List   Diagnosis Date Noted   Recurrent cellulitis of lower extremity 03/26/2020   Atherosclerosis of aorta ([place]) 03/26/2020   Class 2 severe obesity due to excess calories with serious comorbidity and body mass index (BMI) of 37.0 to 37.9 in adult ([place]) 03/26/2020   PVD (peripheral vascular disease) ([place]) 12/30/2018   Type 2 diabetes mellitus with stage 2 chronic kidney disease, without long-term current use of insulin ([place]) 06/17/2018   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   Atherosclerosis of native coronary artery of native heart without angina pectoris 06/17/2018   Arrhythmia 11/18/2017   Hyperlipidemia 11/18/2017   Hypertension 11/18/2017   Varicose veins of left lower extremity with complications 75/01/2584   Pain in both lower extremities 08/14/2015   Globus pharyngeus 07/17/2015   Abdominal pain 12/12/2013   History of carcinoma in situ of prostate 12/12/2013   Male erectile dysfunction 12/12/2013   Personal history of prostate cancer 12/12/2013   OAB (overactive bladder) 11/03/2013   Malignant neoplasm of prostate ([place]) 11/03/2013   Nocturia 11/03/2013   Stress incontinence 11/03/2013   Urinary incontinence 11/03/2013   Headache associated with sexual activity 12/14/2012   Memory loss 12/14/2012   Myalgia and myositis 12/14/2012   Obstructive sleep apnea 12/14/2012   Insomnia 12/14/2012   Partial epilepsy with impairment of consciousness, with intractable epilepsy ([place]) 12/14/2012   Restless legs syndrome 12/14/2012   Tension type headache 12/14/2012   Dehydration, severe 27/78/2423   Metabolic acidosis 53/61/4431   Noninfective gastroenteritis and colitis 07/11/2012   Recent Relevant Labs: Lab Results  Component Value Date/Time   HGBA1C 7.0 (H) 03/14/2020 02:46 PM   HGBA1C 6.8 (H) 12/14/2019 04:25 PM   MICROALBUR 30 07/30/2018 12:32 PM     Kidney Function Lab Results  Component Value Date/Time   CREATININE 1.06 03/14/2020 02:46 PM   CREATININE 1.08 12/14/2019 04:25 PM   GFRNONAA 68 03/14/2020 02:46 PM   GFRAA 79 03/14/2020 02:46 PM    Current antihyperglycemic regimen:  Jardiance 10 mg daily Ozempic 0.5 mg once a week   What recent interventions/DTPs have been made to improve glycemic control:  Patient reports he is taking medications as directed.  Have there been any recent hospitalizations or ED visits since last visit with CPP? No   Patient denies hypoglycemic symptoms, including Pale, Sweaty, Shaky, Hungry, Nervous/irritable and Vision changes   Patient denies hyperglycemic symptoms, including blurry vision, excessive thirst, fatigue, polyuria and weakness   How often are you checking your blood sugar? Patient reports checking his blood sugars a couple times out of the week.  What are your blood sugars ranging?  Fasting:  131 am Before meals: 144 at 12:55 PM. After meals: None Bedtime: None  During the week, how often does your blood glucose drop below 70? Never   Are you checking your feet daily/regularly? Patient reports he is checking his feet daily and wearing his compression stockings. Patient stated he has an injury on his right leg above the ankle but it is recovering well.  Adherence Review: Is the patient currently on a STATIN medication? Yes Is the patient currently on ACE/ARB medication? Yes Does the patient have >5 day gap between last estimated fill dates? Yes    Goals Addressed             This Visit's Progress    Pharmacy Care Plan   On track    CARE PLAN ENTRY (see longitudinal plan of care for additional care plan information)  Current Barriers:  Chronic Disease Management support, education, and care coordination needs related to Hypertension, Hyperlipidemia, and Diabetes and OAB.   Hypertension BP Readings from Last 3 Encounters:  12/14/19 110/60  11/24/19 120/75   01/12/19 102/60   Pharmacist Clinical Goal(s): Over the next 180 days, patient will work with PharmD and providers to maintain BP goal <130/80 Current regimen:  Carvedilol 6.25 mg twice daily Furosemide 20 mg daily prn Losartan 50 mg twice daily Interventions: Provided dietary and exercise recommendations Patient self care activities - Over the next 180 days, patient will: Check BP periodically and if symptomatic document, and provide at future appointments Ensure daily salt intake < 2300 mg/day Try to exercise for 30 minutes 5 times weekly    Hyperlipidemia Lab Results  Component Value Date/Time   LDLCALC 83 12/14/2019 04:25 PM   Pharmacist Clinical Goal(s): Over the next 180 days, patient will work with PharmD and providers to maintain LDL goal < 70 Current regimen:  Atorvastatin 20 mg daily Omega-3 Fatty acids 1200 mg by mouth daily Interventions: Provided dietary and exercise recommendations Encouraged to start diet plan from magazine with goal of losing weight and eliminating medication. Patient self care activities - Over the next 180 days, patient will: Try to exercise for 30 minutes 5 times weekly  Start slow with 1-2 days per week and work up to [place] from health magazine with goal of losing 15-20 lbs.  Diabetes Lab Results  Component Value Date/Time   HGBA1C 6.8 (H) 12/14/2019 04:25 PM   HGBA1C 6.1 (H) 11/18/2018 10:57 AM   Pharmacist Clinical Goal(s): Over the next 120 days, patient will work with PharmD and providers to maintain A1c goal <7% Current regimen:  Jardiance 10 mg daily Ozempic 0.5 mg under the skin every week Interventions: Provided dietary and exercise recommendations Encouraged to make changes with diet from magazine program found on his own. Patient self care activities - Over the next 180 days, patient will: Check blood sugar periodically and if symptomatic, document, and provide at future appointments Contact provider with  any episodes of hypoglycemia Try to exercise for 30 minutes 5 times weekly, begin dietary changes and other lifestyle changes    Please see past updates related to this goal by clicking on the "Past Updates" button in the selected goal          Follow-Up:  Pharmacist Review-Patient reports it is hard to tell if he is experiencing any hyperglycemia or hypoglycemia symptoms.  Patient reports he is needing refills of Atorvastatin 20 MG and Pantoprazole 40 MG. I verbalized to the patient I will send out a message to Dr.  [facility] nurse staff to send request to his preferred pharmacy. The patient voiced understanding.    Staff message sent to Eleanora Neighbor, [place].  Orlando Penner, CPP Notified.  Raynelle Highland, [place] Pharmacist Assistant [phone] CCM Total Time: 16 minutes  I have reviewed the care management and care coordination activities outlined in this encounter and I am certifying that I agree with the content of this note.  Patients medications sent to pharmacy. No further action required. Total Time 6 minutes   Mayford Knife, [facility] 06/28/20 2:00 PM

## 2020-06-26 ENCOUNTER — Other Ambulatory Visit: Payer: Self-pay

## 2020-06-26 MED ORDER — PANTOPRAZOLE SODIUM 40 MG PO TBEC
40.0000 mg | DELAYED_RELEASE_TABLET | Freq: Every day | ORAL | 1 refills | Status: DC
Start: 2020-06-26 — End: 2021-02-12

## 2020-06-26 MED ORDER — ATORVASTATIN CALCIUM 20 MG PO TABS
20.0000 mg | ORAL_TABLET | Freq: Every day | ORAL | 1 refills | Status: DC
Start: 2020-06-26 — End: 2021-02-19

## 2020-06-26 MED ORDER — PANTOPRAZOLE SODIUM 40 MG PO TBEC
40.0000 mg | DELAYED_RELEASE_TABLET | Freq: Every day | ORAL | 1 refills | Status: DC
Start: 2020-06-26 — End: 2020-06-26

## 2020-06-26 MED ORDER — ATORVASTATIN CALCIUM 20 MG PO TABS
20.0000 mg | ORAL_TABLET | Freq: Every day | ORAL | 1 refills | Status: DC
Start: 2020-06-26 — End: 2020-06-26

## 2020-07-06 DIAGNOSIS — J301 Allergic rhinitis due to pollen: Secondary | ICD-10-CM | POA: Diagnosis not present

## 2020-07-06 DIAGNOSIS — G2581 Restless legs syndrome: Secondary | ICD-10-CM | POA: Diagnosis not present

## 2020-07-06 DIAGNOSIS — J4520 Mild intermittent asthma, uncomplicated: Secondary | ICD-10-CM | POA: Diagnosis not present

## 2020-07-06 DIAGNOSIS — R498 Other voice and resonance disorders: Secondary | ICD-10-CM | POA: Diagnosis not present

## 2020-07-06 DIAGNOSIS — G4733 Obstructive sleep apnea (adult) (pediatric): Secondary | ICD-10-CM | POA: Diagnosis not present

## 2020-07-06 DIAGNOSIS — R918 Other nonspecific abnormal finding of lung field: Secondary | ICD-10-CM | POA: Diagnosis not present

## 2020-07-12 ENCOUNTER — Ambulatory Visit: Payer: Medicare Other | Admitting: Internal Medicine

## 2020-07-17 ENCOUNTER — Ambulatory Visit: Payer: Medicare Other | Admitting: Nurse Practitioner

## 2020-07-31 ENCOUNTER — Encounter: Payer: Self-pay | Admitting: Nurse Practitioner

## 2020-07-31 ENCOUNTER — Ambulatory Visit (INDEPENDENT_AMBULATORY_CARE_PROVIDER_SITE_OTHER): Payer: Medicare Other | Admitting: Nurse Practitioner

## 2020-07-31 ENCOUNTER — Other Ambulatory Visit: Payer: Self-pay

## 2020-07-31 VITALS — BP 118/70 | HR 70 | Temp 97.8°F | Ht 67.7 in | Wt 239.4 lb

## 2020-07-31 DIAGNOSIS — E1122 Type 2 diabetes mellitus with diabetic chronic kidney disease: Secondary | ICD-10-CM

## 2020-07-31 DIAGNOSIS — Z6837 Body mass index (BMI) 37.0-37.9, adult: Secondary | ICD-10-CM | POA: Diagnosis not present

## 2020-07-31 DIAGNOSIS — Z8546 Personal history of malignant neoplasm of prostate: Secondary | ICD-10-CM

## 2020-07-31 DIAGNOSIS — N182 Chronic kidney disease, stage 2 (mild): Secondary | ICD-10-CM

## 2020-07-31 DIAGNOSIS — I700 Atherosclerosis of aorta: Secondary | ICD-10-CM | POA: Diagnosis not present

## 2020-07-31 DIAGNOSIS — R5383 Other fatigue: Secondary | ICD-10-CM | POA: Diagnosis not present

## 2020-07-31 DIAGNOSIS — G2581 Restless legs syndrome: Secondary | ICD-10-CM

## 2020-07-31 DIAGNOSIS — E039 Hypothyroidism, unspecified: Secondary | ICD-10-CM | POA: Diagnosis not present

## 2020-07-31 DIAGNOSIS — I10 Essential (primary) hypertension: Secondary | ICD-10-CM

## 2020-07-31 DIAGNOSIS — I129 Hypertensive chronic kidney disease with stage 1 through stage 4 chronic kidney disease, or unspecified chronic kidney disease: Secondary | ICD-10-CM | POA: Diagnosis not present

## 2020-07-31 NOTE — Patient Instructions (Addendum)
Hypertension, Adult Hypertension is another name for high blood pressure. High blood pressure forces your heart to work harder to pump blood. This can cause problems over time. There are two numbers in a blood pressure reading. There is a top number (systolic) over a bottom number (diastolic). It is best to have a blood pressure that is below 120/80. Healthy choices can help lower your blood pressure, or you may need medicine to help lower it. What are the causes? The cause of this condition is not known. Some conditions may be related to high blood pressure. What increases the risk?  Smoking.  Having type 2 diabetes mellitus, high cholesterol, or both.  Not getting enough exercise or physical activity.  Being overweight.  Having too much fat, sugar, calories, or salt (sodium) in your diet.  Drinking too much alcohol.  Having long-term (chronic) kidney disease.  Having a family history of high blood pressure.  Age. Risk increases with age.  Race. You may be at higher risk if you are African American.  Gender. Men are at higher risk than women before age 45. After age 65, women are at higher risk than men.  Having obstructive sleep apnea.  Stress. What are the signs or symptoms?  High blood pressure may not cause symptoms. Very high blood pressure (hypertensive crisis) may cause: ? Headache. ? Feelings of worry or nervousness (anxiety). ? Shortness of breath. ? Nosebleed. ? A feeling of being sick to your stomach (nausea). ? Throwing up (vomiting). ? Changes in how you see. ? Very bad chest pain. ? Seizures. How is this treated?  This condition is treated by making healthy lifestyle changes, such as: ? Eating healthy foods. ? Exercising more. ? Drinking less alcohol.  Your health care provider may prescribe medicine if lifestyle changes are not enough to get your blood pressure under control, and if: ? Your top number is above 130. ? Your bottom number is above  80.  Your personal target blood pressure may vary. Follow these instructions at home: Eating and drinking  If told, follow the DASH eating plan. To follow this plan: ? Fill one half of your plate at each meal with fruits and vegetables. ? Fill one fourth of your plate at each meal with whole grains. Whole grains include whole-wheat pasta, brown rice, and whole-grain bread. ? Eat or drink low-fat dairy products, such as skim milk or low-fat yogurt. ? Fill one fourth of your plate at each meal with low-fat (lean) proteins. Low-fat proteins include fish, chicken without skin, eggs, beans, and tofu. ? Avoid fatty meat, cured and processed meat, or chicken with skin. ? Avoid pre-made or processed food.  Eat less than 1,500 mg of salt each day.  Do not drink alcohol if: ? Your doctor tells you not to drink. ? You are pregnant, may be pregnant, or are planning to become pregnant.  If you drink alcohol: ? Limit how much you use to:  0-1 drink a day for women.  0-2 drinks a day for men. ? Be aware of how much alcohol is in your drink. In the [place] one drink equals one 12 oz bottle of beer (355 mL), one 5 oz glass of wine (148 mL), or one 1 oz glass of hard liquor (44 mL).   Lifestyle  Work with your doctor to stay at a healthy weight or to lose weight. Ask your doctor what the best weight is for you.  Get at least 30 minutes of exercise most   days of the week. This may include walking, swimming, or biking.  Get at least 30 minutes of exercise that strengthens your muscles (resistance exercise) at least 3 days a week. This may include lifting weights or doing Pilates.  Do not use any products that contain nicotine or tobacco, such as cigarettes, e-cigarettes, and chewing tobacco. If you need help quitting, ask your doctor.  Check your blood pressure at home as told by your doctor.  Keep all follow-up visits as told by your doctor. This is important.   Medicines  Take over-the-counter  and prescription medicines only as told by your doctor. Follow directions carefully.  Do not skip doses of blood pressure medicine. The medicine does not work as well if you skip doses. Skipping doses also puts you at risk for problems.  Ask your doctor about side effects or reactions to medicines that you should watch for. Contact a doctor if you:  Think you are having a reaction to the medicine you are taking.  Have headaches that keep coming back (recurring).  Feel dizzy.  Have swelling in your ankles.  Have trouble with your vision. Get help right away if you:  Get a very bad headache.  Start to feel mixed up (confused).  Feel weak or numb.  Feel faint.  Have very bad pain in your: ? Chest. ? Belly (abdomen).  Throw up more than once.  Have trouble breathing. Summary  Hypertension is another name for high blood pressure.  High blood pressure forces your heart to work harder to pump blood.  For most people, a normal blood pressure is less than 120/80.  Making healthy choices can help lower blood pressure. If your blood pressure does not get lower with healthy choices, you may need to take medicine. This information is not intended to replace advice given to you by your health care provider. Make sure you discuss any questions you have with your health care provider. Document Revised: 12/17/2017 Document Reviewed: 12/17/2017 Elsevier Patient Education  [place]. Diabetes Mellitus and Exercise Exercising regularly is important for overall health, especially for people who have diabetes mellitus. Exercising is not only about losing weight. It has many other health benefits, such as increasing muscle strength and bone density and reducing body fat and stress. This leads to improved fitness, flexibility, and endurance, all of which result in better overall health. What are the benefits of exercise if I have diabetes? Exercise has many benefits for people with  diabetes. They include:  Helping to lower and control blood sugar (glucose).  Helping the body to respond better to the hormone insulin by improving insulin sensitivity.  Reducing how much insulin the body needs.  Lowering the risk for heart disease by: ? Lowering "bad" cholesterol and triglyceride levels. ? Increasing "good" cholesterol levels. ? Lowering blood pressure. ? Lowering blood glucose levels. What is my activity plan? Your health care provider or certified diabetes educator can help you make a plan for the type and frequency of exercise that works for you. This is called your activity plan. Be sure to:  Get at least 150 minutes of medium-intensity or high-intensity exercise each week. Exercises may include brisk walking, biking, or water aerobics.  Do stretching and strengthening exercises, such as yoga or weight lifting, at least 2 times a week.  Spread out your activity over at least 3 days of the week.  Get some form of physical activity each day. ? Do not go more than 2 days in  a row without some kind of physical activity. ? Avoid being inactive for more than 90 minutes at a time. Take frequent breaks to walk or stretch.  Choose exercises or activities that you enjoy. Set realistic goals.  Start slowly and gradually increase your exercise intensity over time.   How do I manage my diabetes during exercise? Monitor your blood glucose  Check your blood glucose before and after exercising. If your blood glucose is: ? 240 mg/dL (13.3 mmol/L) or higher before you exercise, check your urine for ketones. These are chemicals created by the liver. If you have ketones in your urine, do not exercise until your blood glucose returns to normal. ? 100 mg/dL (5.6 mmol/L) or lower, eat a snack containing 15-20 grams of carbohydrate. Check your blood glucose 15 minutes after the snack to make sure that your glucose level is above 100 mg/dL (5.6 mmol/L) before you start your  exercise.  Know the symptoms of low blood glucose (hypoglycemia) and how to treat it. Your risk for hypoglycemia increases during and after exercise. Follow these tips and your health care provider's instructions  Keep a carbohydrate snack that is fast-acting for use before, during, and after exercise to help prevent or treat hypoglycemia.  Avoid injecting insulin into areas of the body that are going to be exercised. For example, avoid injecting insulin into: ? Your arms, when you are about to play tennis. ? Your legs, when you are about to go jogging.  Keep records of your exercise habits. Doing this can help you and your health care provider adjust your diabetes management plan as needed. Write down: ? Food that you eat before and after you exercise. ? Blood glucose levels before and after you exercise. ? The type and amount of exercise you have done.  Work with your health care provider when you start a new exercise or activity. He or she may need to: ? Make sure that the activity is safe for you. ? Adjust your insulin, other medicines, and food that you eat.  Drink plenty of water while you exercise. This prevents loss of water (dehydration) and problems caused by a lot of heat in the body (heat stroke).   Where to find more information  American Diabetes Association: www.diabetes.org Summary  Exercising regularly is important for overall health, especially for people who have diabetes mellitus.  Exercising has many health benefits. It increases muscle strength and bone density and reduces body fat and stress. It also lowers and controls blood glucose.  Your health care provider or certified diabetes educator can help you make an activity plan for the type and frequency of exercise that works for you.  Work with your health care provider to make sure any new activity is safe for you. Also work with your health care provider to adjust your insulin, other medicines, and the food  you eat. This information is not intended to replace advice given to you by your health care provider. Make sure you discuss any questions you have with your health care provider. Document Revised: 01/04/2019 Document Reviewed: 01/04/2019 Elsevier Patient Education  2021 [employer] are companies who send meals prepackaged and healthy (diabetic)

## 2020-07-31 NOTE — Progress Notes (Signed)
Rutherford Nail as a scribe for [name], FNP.,have documented all relevant documentation on the behalf of [name], FNP,as directed by  [name], FNP while in the presence of [name], [place]. This visit occurred during the SARS-CoV-2 public health emergency.  Safety protocols were in place, including screening questions prior to the visit, additional usage of staff PPE, and extensive cleaning of exam room while observing appropriate contact time as indicated for disinfecting solutions.  Subjective:     Patient ID: Franklin Thornton , male    DOB: 01-28-1946 , 75 y.o.   MRN: [id number]   Chief Complaint  Patient presents with  . Diabetes    HPI  Patient is here for diabetes and hypertension follow up. He reports compliance with meds.    Wt Readings from Last 3 Encounters: 07/31/20 : 239 lb 6.4 oz (108.6 kg) 03/14/20 : 242 lb 9.6 oz (110 kg) 12/14/19 : 240 lb (108.9 kg)  He has been seen by Neurology in the past.   Diabetes He presents for his follow-up diabetic visit. He has type 2 diabetes mellitus. His disease course has been improving. Pertinent negatives for hypoglycemia include no dizziness or headaches. Pertinent negatives for diabetes include no blurred vision, no chest pain, no fatigue, no polydipsia, no polyphagia and no polyuria. There are no hypoglycemic complications. Risk factors for coronary artery disease include diabetes mellitus, dyslipidemia, hypertension, male sex, obesity and sedentary lifestyle. His breakfast blood glucose is taken between 9-10 am. His breakfast blood glucose range is generally 90-110 mg/dl. An ACE inhibitor/angiotensin II receptor blocker is being taken. Eye exam is current.  Hypertension This is a chronic problem. The current episode started more than 1 year ago. Pertinent negatives include no blurred vision, chest pain, headaches, palpitations or shortness of breath.    Past Medical History:  Diagnosis Date  . Atrial  fibrillation ([place])   . Diabetes mellitus without complication ([place])   . Peripheral vascular disease ([place])   . Prostate cancer ([place])   . Uvular edema 07/17/2015     Family History  Problem Relation Age of Onset  . Lung cancer Mother   . Diabetes Mother   . Esophageal cancer Father   . Hypertension Sister   . Breast cancer Sister   . Healthy Son   . Healthy Daughter      Current Outpatient Medications:  .  albuterol (PROVENTIL HFA;VENTOLIN HFA) 108 (90 Base) MCG/ACT inhaler, Inhale 2 puffs into the lungs every 6 (six) hours as needed for wheezing or shortness of breath., Disp: , Rfl:  .  albuterol (PROVENTIL) (2.5 MG/3ML) 0.083% nebulizer solution, Take 3 mLs (2.5 mg total) by nebulization every 6 (six) hours as needed for wheezing or shortness of breath., Disp: 75 mL, Rfl: 3 .  amiodarone (PACERONE) 100 MG tablet, Take 100 mg by mouth daily. , Disp: , Rfl:  .  aspirin 81 MG tablet, Take 81 mg by mouth daily., Disp: , Rfl:  .  atorvastatin (LIPITOR) 20 MG tablet, Take 1 tablet (20 mg total) by mouth daily., Disp: 90 tablet, Rfl: 1 .  b complex vitamins capsule, Take 1 capsule by mouth daily., Disp: , Rfl:  .  budesonide-formoterol (SYMBICORT) 160-4.5 MCG/ACT inhaler, Inhale 2 puffs into the lungs 2 (two) times daily. , Disp: , Rfl:  .  carvedilol (COREG) 6.25 MG tablet, Take 6.25 mg by mouth 2 (two) times daily with a meal., Disp: , Rfl:  .  Cholecalciferol (VITAMIN D) 2000 units tablet, Take  2,000 Units by mouth daily., Disp: , Rfl:  .  diphenhydrAMINE (BENADRYL) 25 MG tablet, Take 25 mg by mouth every 6 (six) hours as needed for itching. , Disp: , Rfl:  .  donepezil (ARICEPT) 10 MG tablet, Take 1 tablet (10 mg total) by mouth at bedtime., Disp: 90 tablet, Rfl: 1 .  DULoxetine (CYMBALTA) 30 MG capsule, TAKE 1 CAPSULE DAILY IN THE EVENING, Disp: 90 capsule, Rfl: 3 .  furosemide (LASIX) 20 MG tablet, Take 20 mg by mouth daily as needed. , Disp: , Rfl:  .  gabapentin (NEURONTIN) 600 MG  tablet, Take 600 mg by mouth 2 (two) times daily., Disp: , Rfl:  .  glucose monitoring kit (FREESTYLE) monitoring kit, 1 each by Does not apply route as needed for other., Disp: , Rfl:  .  HYDROcodone-acetaminophen (NORCO/VICODIN) 5-325 MG tablet, Take 1 tablet by mouth every 6 (six) hours as needed for moderate pain., Disp: , Rfl:  .  HYDROcodone-homatropine (HYDROMET) 5-1.5 MG/5ML syrup, Take 5 mLs by mouth every 6 (six) hours as needed., Disp: 120 mL, Rfl: 0 .  ipratropium (ATROVENT) 0.03 % nasal spray, Place 2 sprays into both nostrils every 12 (twelve) hours., Disp: , Rfl:  .  JARDIANCE 10 MG TABS tablet, TAKE 1 TABLET DAILY, Disp: 90 tablet, Rfl: 3 .  levocetirizine (XYZAL) 5 MG tablet, Take 5 mg by mouth every evening. , Disp: , Rfl:  .  levothyroxine (SYNTHROID) 50 MCG tablet, TAKE 1 TABLET DAILY, Disp: 90 tablet, Rfl: 3 .  lidocaine (LIDODERM) 5 %, Place 3 patches onto the skin daily. May use 1-3 patches at one time, Remove & Discard patch within 12 hours or as directed by MD, Disp: 90 patch, Rfl: 0 .  losartan (COZAAR) 50 MG tablet, Take 50 mg by mouth 2 (two) times daily. , Disp: , Rfl:  .  Magnesium 250 MG TABS, Take 1 tablet by mouth daily., Disp: , Rfl:  .  methocarbamol (ROBAXIN) 750 MG tablet, Take 750 mg by mouth 3 (three) times daily as needed for muscle spasms. , Disp: , Rfl:  .  mirabegron ER (MYRBETRIQ) 50 MG TB24 tablet, Take 50 mg by mouth daily., Disp: , Rfl:  .  montelukast (SINGULAIR) 10 MG tablet, Take 10 mg by mouth at bedtime., Disp: , Rfl:  .  Multiple Vitamin (MULTIVITAMIN) tablet, Take 1 tablet by mouth daily., Disp: , Rfl:  .  nitroGLYCERIN (NITROSTAT) 0.4 MG SL tablet, Place 0.4 mg under the tongue every 5 (five) minutes as needed for chest pain. , Disp: , Rfl:  .  Omega-3 Fatty Acids (FISH OIL) 1200 MG CAPS, Take 1,200 mg by mouth. , Disp: , Rfl:  .  OZEMPIC, 0.25 OR 0.5 MG/DOSE, 2 MG/1.5ML SOPN, INJECT 0.$RemoveBefore'5MG'[id number]$  UNDER THE SKIN EVERY WEEK, Disp: 45 mL, Rfl: 3 .   pantoprazole (PROTONIX) 40 MG tablet, Take 1 tablet (40 mg total) by mouth daily., Disp: 90 tablet, Rfl: 1 .  pramipexole (MIRAPEX) 0.5 MG tablet, Take 2 tablets (1 mg total) by mouth in the morning and at bedtime., Disp: 90 tablet, Rfl: 1 .  rivaroxaban (XARELTO) 10 MG TABS tablet, Take 10 mg by mouth daily., Disp: , Rfl:  .  solifenacin (VESICARE) 5 MG tablet, Take 1 tablet (5 mg total) by mouth daily., Disp: 90 tablet, Rfl: 1 .  vitamin B-12 (CYANOCOBALAMIN) 1000 MCG tablet, Take 1,000 mcg by mouth daily., Disp: , Rfl:    Allergies  Allergen Reactions  . No Known Allergies  Review of Systems  Constitutional: Negative.  Negative for fatigue.  HENT: Negative.   Eyes: Negative for blurred vision.  Respiratory: Negative for shortness of breath.   Cardiovascular: Negative for chest pain, palpitations and leg swelling.  Endocrine: Negative for polydipsia, polyphagia and polyuria.  Musculoskeletal: Negative.   Skin: Negative.        Bilateral lower extremities are slightly reddened  Neurological: Negative for dizziness and headaches.  Psychiatric/Behavioral: Negative.      Today's Vitals   07/31/20 1514  BP: 118/70  Pulse: 70  Temp: 97.8 F (36.6 C)  TempSrc: Oral  Weight: 239 lb 6.4 oz (108.6 kg)  Height: 5' 7.7" (1.72 m)  PainSc: 0-No pain   Body mass index is 36.72 kg/m.   Objective:  Physical Exam Vitals reviewed.  Constitutional:      General: He is not in acute distress.    Appearance: Normal appearance. He is obese.  HENT:     Head: Normocephalic.     Right Ear: Tympanic membrane, ear canal and external ear normal. There is no impacted cerumen.     Left Ear: Tympanic membrane, ear canal and external ear normal. There is no impacted cerumen.     Nose: Nose normal. No congestion.     Mouth/Throat:     Mouth: Mucous membranes are moist.  Eyes:     Extraocular Movements: Extraocular movements intact.     Conjunctiva/sclera: Conjunctivae normal.     Pupils:  Pupils are equal, round, and reactive to light.  Cardiovascular:     Rate and Rhythm: Normal rate and regular rhythm.     Pulses: Normal pulses.     Heart sounds: Normal heart sounds. No murmur heard.   Pulmonary:     Effort: Pulmonary effort is normal. No respiratory distress.     Breath sounds: Normal breath sounds. No wheezing.  Skin:    General: Skin is warm and dry.     Capillary Refill: Capillary refill takes less than 2 seconds.  Neurological:     General: No focal deficit present.     Mental Status: He is alert and oriented to person, place, and time.     Cranial Nerves: No cranial nerve deficit.  Psychiatric:        Mood and Affect: Mood normal.        Behavior: Behavior normal.        Thought Content: Thought content normal.        Judgment: Judgment normal.         Assessment And Plan:     1. Type 2 diabetes mellitus with stage 2 chronic kidney disease, without long-term current use of insulin (HCC)  Check blood sugars daily   Declines a referral to the diabetic nutritionist.  Chronic, slightly elevated at last visit Continue with current medications Encouraged to limit intake of sugary foods and drinks Encouraged to increase physical activity to 150 minutes per week Diabetic foot exam done, no abnormal finding Eye exam (will get records from Dr. Katy Fitch) - Hemoglobin A1c  2. Primary hypertension . B/P is well controlled.  . CMP ordered to check renal function.  . The importance of regular exercise and dietary modification was stressed to the patient.  - CMP14+EGFR  3. Atherosclerosis of aorta (HCC)  Chronic, continue with statin - Lipid panel - CMP14+EGFR  4. Acquired hypothyroidism  Chronic, will check thyroid levels  Continue with synthroid and will make changes pending lab results - TSH - T3, free - T4  5. Class 2 severe obesity due to excess calories with serious comorbidity and body mass index (BMI) of 37.0 to 37.9 in adult  [facility])  Chronic  Discussed healthy diet and regular exercise options   Encouraged to exercise at least 150 minutes per week with 2 days of strength training.  6. Personal history of prostate cancer  7. Fatigue, unspecified type  This may be related to his restless leg syndrome he has been evaluated in the past for the fatigue.   Will refer to Neurology for a second opinion as he does not feel he is any better with his restless leg syndrome. - Ambulatory referral to Neurology  8. Restless leg  Continue with his [place] and ropinrinole that has been ordered by Neurology - Ambulatory referral to Neurology     Patient was given opportunity to ask questions. Patient verbalized understanding of the plan and was able to repeat key elements of the plan. All questions were answered to their satisfaction.  [name], FNP   I, [name], FNP, have reviewed all documentation for this visit. The documentation on 07/31/20 for the exam, diagnosis, procedures, and orders are all accurate and complete.   IF YOU HAVE BEEN REFERRED TO A SPECIALIST, IT MAY TAKE 1-2 WEEKS TO SCHEDULE/PROCESS THE REFERRAL. IF YOU HAVE NOT HEARD FROM US/SPECIALIST IN TWO WEEKS, PLEASE GIVE [place] A CALL AT [phone].   THE PATIENT IS ENCOURAGED TO PRACTICE SOCIAL DISTANCING DUE TO THE COVID-19 PANDEMIC.

## 2020-08-01 LAB — CMP14+EGFR
ALT: 23 IU/L (ref 0–44)
AST: 23 IU/L (ref 0–40)
Albumin/Globulin Ratio: 1.3 (ref 1.2–2.2)
Albumin: 3.9 g/dL (ref 3.7–4.7)
Alkaline Phosphatase: 77 IU/L (ref 44–121)
BUN/Creatinine Ratio: 24 (ref 10–24)
BUN: 24 mg/dL (ref 8–27)
Bilirubin Total: 0.4 mg/dL (ref 0.0–1.2)
CO2: 24 mmol/L (ref 20–29)
Calcium: 9.5 mg/dL (ref 8.6–10.2)
Chloride: 101 mmol/L (ref 96–106)
Creatinine, Ser: 1.01 mg/dL (ref 0.76–1.27)
Globulin, Total: 3 g/dL (ref 1.5–4.5)
Glucose: 141 mg/dL — ABNORMAL HIGH (ref 65–99)
Potassium: 4.7 mmol/L (ref 3.5–5.2)
Sodium: 143 mmol/L (ref 134–144)
Total Protein: 6.9 g/dL (ref 6.0–8.5)
eGFR: 78 mL/min/{1.73_m2} (ref >59)

## 2020-08-01 LAB — HEMOGLOBIN A1C
Est. average glucose Bld gHb Est-mCnc: 151 mg/dL
Hgb A1c MFr Bld: 6.9 % — ABNORMAL HIGH (ref 4.8–5.6)

## 2020-08-01 LAB — TSH: TSH: 1.24 u[IU]/mL (ref 0.450–4.500)

## 2020-08-01 LAB — LIPID PANEL
Chol/HDL Ratio: 3.9 ratio (ref 0.0–5.0)
Cholesterol, Total: 181 mg/dL (ref 100–199)
HDL: 47 mg/dL (ref >39)
LDL Chol Calc (NIH): 85 mg/dL (ref 0–99)
Triglycerides: 298 mg/dL — ABNORMAL HIGH (ref 0–149)
VLDL Cholesterol Cal: 49 mg/dL — ABNORMAL HIGH (ref 5–40)

## 2020-08-01 LAB — T4: T4, Total: 10.4 ug/dL (ref 4.5–12.0)

## 2020-08-01 LAB — T3, FREE: T3, Free: 2.4 pg/mL (ref 2.0–4.4)

## 2020-08-02 DIAGNOSIS — R0600 Dyspnea, unspecified: Secondary | ICD-10-CM | POA: Diagnosis not present

## 2020-08-03 ENCOUNTER — Other Ambulatory Visit: Payer: Self-pay | Admitting: Internal Medicine

## 2020-08-07 ENCOUNTER — Other Ambulatory Visit: Payer: Self-pay

## 2020-08-07 MED ORDER — OZEMPIC (0.25 OR 0.5 MG/DOSE) 2 MG/1.5ML ~~LOC~~ SOPN: PEN_INJECTOR | SUBCUTANEOUS | 3 refills | Status: DC

## 2020-08-08 ENCOUNTER — Other Ambulatory Visit: Payer: Self-pay

## 2020-08-11 ENCOUNTER — Telehealth: Payer: Self-pay

## 2020-08-11 NOTE — Chronic Care Management (AMB) (Signed)
Chronic Care Management Pharmacy Assistant   Name: Franklin Thornton  MRN: [id number] DOB: [birth year]   Reason for Encounter: Appointment   Recent office visits:  07/31/20 -Arnette Felts, FNP(PCP)  Recent consult visits:  None noted  Hospital visits:  None in previous 6 months  Medications: Outpatient Encounter Medications as of 08/11/2020  Medication Sig  . albuterol (PROVENTIL HFA;VENTOLIN HFA) 108 (90 Base) MCG/ACT inhaler Inhale 2 puffs into the lungs every 6 (six) hours as needed for wheezing or shortness of breath.  [name] albuterol (PROVENTIL) (2.5 MG/3ML) 0.083% nebulizer solution Take 3 mLs (2.5 mg total) by nebulization every 6 (six) hours as needed for wheezing or shortness of breath.  [name] amiodarone (PACERONE) 100 MG tablet Take 100 mg by mouth daily.   [name] aspirin 81 MG tablet Take 81 mg by mouth daily.  [name] atorvastatin (LIPITOR) 20 MG tablet Take 1 tablet (20 mg total) by mouth daily.  [name] b complex vitamins capsule Take 1 capsule by mouth daily.  . budesonide-formoterol (SYMBICORT) 160-4.5 MCG/ACT inhaler Inhale 2 puffs into the lungs 2 (two) times daily.   . carvedilol (COREG) 6.25 MG tablet Take 6.25 mg by mouth 2 (two) times daily with a meal.  . Cholecalciferol (VITAMIN D) 2000 units tablet Take 2,000 Units by mouth daily.  . diphenhydrAMINE (BENADRYL) 25 MG tablet Take 25 mg by mouth every 6 (six) hours as needed for itching.   . donepezil (ARICEPT) 10 MG tablet TAKE 1 TABLET AT BEDTIME  . DULoxetine (CYMBALTA) 30 MG capsule TAKE 1 CAPSULE DAILY IN THE EVENING  . furosemide (LASIX) 20 MG tablet Take 20 mg by mouth daily as needed.   . gabapentin (NEURONTIN) 600 MG tablet Take 600 mg by mouth 2 (two) times daily.  [name] glucose monitoring kit (FREESTYLE) monitoring kit 1 each by Does not apply route as needed for other.  . HYDROcodone-acetaminophen (NORCO/VICODIN) 5-325 MG tablet Take 1 tablet by mouth every 6 (six) hours as needed for moderate pain.  [name] HYDROcodone-homatropine  (HYDROMET) 5-1.5 MG/5ML syrup Take 5 mLs by mouth every 6 (six) hours as needed.  [name] ipratropium (ATROVENT) 0.03 % nasal spray Place 2 sprays into both nostrils every 12 (twelve) hours.  [name] JARDIANCE 10 MG TABS tablet TAKE 1 TABLET DAILY  . levocetirizine (XYZAL) 5 MG tablet Take 5 mg by mouth every evening.   [name] levothyroxine (SYNTHROID) 50 MCG tablet TAKE 1 TABLET DAILY  . lidocaine (LIDODERM) 5 % Place 3 patches onto the skin daily. May use 1-3 patches at one time, Remove & Discard patch within 12 hours or as directed by MD  . losartan (COZAAR) 50 MG tablet Take 50 mg by mouth 2 (two) times daily.   . Magnesium 250 MG TABS Take 1 tablet by mouth daily.  . methocarbamol (ROBAXIN) 750 MG tablet Take 750 mg by mouth 3 (three) times daily as needed for muscle spasms.   . mirabegron ER (MYRBETRIQ) 50 MG TB24 tablet Take 50 mg by mouth daily.  . montelukast (SINGULAIR) 10 MG tablet Take 10 mg by mouth at bedtime.  . Multiple Vitamin (MULTIVITAMIN) tablet Take 1 tablet by mouth daily.  . nitroGLYCERIN (NITROSTAT) 0.4 MG SL tablet Place 0.4 mg under the tongue every 5 (five) minutes as needed for chest pain.   . Omega-3 Fatty Acids (FISH OIL) 1200 MG CAPS Take 1,200 mg by mouth.   . pantoprazole (PROTONIX) 40 MG tablet Take 1 tablet (40 mg total) by mouth daily.  . pramipexole (  MIRAPEX) 0.5 MG tablet Take 2 tablets (1 mg total) by mouth in the morning and at bedtime.  . rivaroxaban (XARELTO) 10 MG TABS tablet Take 10 mg by mouth daily.  . Semaglutide,0.25 or 0.5MG /DOS, (OZEMPIC, 0.25 OR 0.5 MG/DOSE,) 2 MG/1.5ML SOPN INJECT 0.5MG  UNDER THE SKIN EVERY WEEK  . solifenacin (VESICARE) 5 MG tablet Take 1 tablet (5 mg total) by mouth daily.  . vitamin B-12 (CYANOCOBALAMIN) 1000 MCG tablet Take 1,000 mcg by mouth daily.   No facility-administered encounter medications on file as of 08/11/2020.    Called patient and scheduled follow up appointment with Orlando Penner, CPP on 08/24/20 at 1:00PM.   Lizbeth Bark Clinical Pharmacist Assistant [phone]

## 2020-08-17 DIAGNOSIS — J9811 Atelectasis: Secondary | ICD-10-CM | POA: Diagnosis not present

## 2020-08-17 DIAGNOSIS — G2581 Restless legs syndrome: Secondary | ICD-10-CM | POA: Diagnosis not present

## 2020-08-17 DIAGNOSIS — J301 Allergic rhinitis due to pollen: Secondary | ICD-10-CM | POA: Diagnosis not present

## 2020-08-17 DIAGNOSIS — R918 Other nonspecific abnormal finding of lung field: Secondary | ICD-10-CM | POA: Diagnosis not present

## 2020-08-17 DIAGNOSIS — G4733 Obstructive sleep apnea (adult) (pediatric): Secondary | ICD-10-CM | POA: Diagnosis not present

## 2020-08-17 DIAGNOSIS — R498 Other voice and resonance disorders: Secondary | ICD-10-CM | POA: Diagnosis not present

## 2020-08-17 DIAGNOSIS — J4520 Mild intermittent asthma, uncomplicated: Secondary | ICD-10-CM | POA: Diagnosis not present

## 2020-08-17 DIAGNOSIS — R059 Cough, unspecified: Secondary | ICD-10-CM | POA: Diagnosis not present

## 2020-08-17 DIAGNOSIS — R051 Acute cough: Secondary | ICD-10-CM | POA: Diagnosis not present

## 2020-08-18 ENCOUNTER — Encounter: Payer: Self-pay | Admitting: Internal Medicine

## 2020-08-18 DIAGNOSIS — R072 Precordial pain: Secondary | ICD-10-CM | POA: Diagnosis not present

## 2020-08-18 DIAGNOSIS — I10 Essential (primary) hypertension: Secondary | ICD-10-CM | POA: Diagnosis not present

## 2020-08-18 DIAGNOSIS — E785 Hyperlipidemia, unspecified: Secondary | ICD-10-CM | POA: Diagnosis not present

## 2020-08-18 DIAGNOSIS — I25118 Atherosclerotic heart disease of native coronary artery with other forms of angina pectoris: Secondary | ICD-10-CM | POA: Diagnosis not present

## 2020-08-22 ENCOUNTER — Ambulatory Visit (INDEPENDENT_AMBULATORY_CARE_PROVIDER_SITE_OTHER): Payer: Medicare Other | Admitting: Internal Medicine

## 2020-08-22 ENCOUNTER — Encounter: Payer: Self-pay | Admitting: Internal Medicine

## 2020-08-22 ENCOUNTER — Other Ambulatory Visit: Payer: Self-pay

## 2020-08-22 VITALS — BP 118/78 | HR 82 | Temp 98.4°F | Ht 67.0 in | Wt 239.6 lb

## 2020-08-22 DIAGNOSIS — R41840 Attention and concentration deficit: Secondary | ICD-10-CM | POA: Diagnosis not present

## 2020-08-22 DIAGNOSIS — F630 Pathological gambling: Secondary | ICD-10-CM | POA: Diagnosis not present

## 2020-08-22 DIAGNOSIS — E538 Deficiency of other specified B group vitamins: Secondary | ICD-10-CM

## 2020-08-22 DIAGNOSIS — Z87891 Personal history of nicotine dependence: Secondary | ICD-10-CM

## 2020-08-22 DIAGNOSIS — Z79899 Other long term (current) drug therapy: Secondary | ICD-10-CM | POA: Diagnosis not present

## 2020-08-22 DIAGNOSIS — R5383 Other fatigue: Secondary | ICD-10-CM

## 2020-08-22 DIAGNOSIS — Z6837 Body mass index (BMI) 37.0-37.9, adult: Secondary | ICD-10-CM

## 2020-08-22 DIAGNOSIS — I1310 Hypertensive heart and chronic kidney disease without heart failure, with stage 1 through stage 4 chronic kidney disease, or unspecified chronic kidney disease: Secondary | ICD-10-CM

## 2020-08-22 DIAGNOSIS — I700 Atherosclerosis of aorta: Secondary | ICD-10-CM

## 2020-08-22 DIAGNOSIS — IMO0001 Reserved for inherently not codable concepts without codable children: Secondary | ICD-10-CM

## 2020-08-22 DIAGNOSIS — R911 Solitary pulmonary nodule: Secondary | ICD-10-CM

## 2020-08-22 DIAGNOSIS — I10 Essential (primary) hypertension: Secondary | ICD-10-CM

## 2020-08-22 DIAGNOSIS — G40219 Localization-related (focal) (partial) symptomatic epilepsy and epileptic syndromes with complex partial seizures, intractable, without status epilepticus: Secondary | ICD-10-CM

## 2020-08-22 DIAGNOSIS — N182 Chronic kidney disease, stage 2 (mild): Secondary | ICD-10-CM

## 2020-08-22 MED ORDER — CYANOCOBALAMIN 1000 MCG/ML IJ SOLN: 1000.0000 ug | Freq: Once | INTRAMUSCULAR | Status: DC

## 2020-08-22 NOTE — Progress Notes (Signed)
Earleen Newport as a Education administrator for [name], MD.,have documented all relevant documentation on the behalf of [name], MD,as directed by  [name], MD while in the presence of [name], MD. This visit occurred during the SARS-CoV-2 public health emergency.  Safety protocols were in place, including screening questions prior to the visit, additional usage of staff PPE, and extensive cleaning of exam room while observing appropriate contact time as indicated for disinfecting solutions.  Subjective:     Patient ID: Franklin Thornton , male    DOB: 1945/10/30 , 75 y.o.   MRN: [id number]   Chief Complaint  Patient presents with  . Referral    HPI  Patient is here to get a referral for psychologist. He is accompanied by his wife today. He would like referral to see therapist to address his self diagnosed ADD.  He was told from a friend that Ritalin could help him. He also complain of his energy levels at the moment, feels fatigued. Pt also states of not getting enough sleep, being restless.   Additionally, he reports having an issue with gambling. He has never mentioned this in the past. Wife adds that there is literature stating Mirapex is associated with gambling. This was initially prescribed by Neurology to address restless legs. Pt states this medication does help with his symptoms. He does report that the gambling has interfered with his home life. He feels it "controls his life". He has been unable to stop on his own.   Hypertension This is a chronic problem. The current episode started more than 1 year ago. Pertinent negatives include no palpitations or shortness of breath. Risk factors for coronary artery disease include diabetes mellitus, dyslipidemia, male gender, obesity and sedentary lifestyle.     Past Medical History:  Diagnosis Date  . Atrial fibrillation ([place])   . Diabetes mellitus without complication ([place])   . Peripheral vascular disease ([place])   .  Prostate cancer ([place])   . Uvular edema 07/17/2015     Family History  Problem Relation Age of Onset  . Lung cancer Mother   . Diabetes Mother   . Esophageal cancer Father   . Hypertension Sister   . Breast cancer Sister   . Healthy Son   . Healthy Daughter      Current Outpatient Medications:  .  albuterol (PROVENTIL HFA;VENTOLIN HFA) 108 (90 Base) MCG/ACT inhaler, Inhale 2 puffs into the lungs every 6 (six) hours as needed for wheezing or shortness of breath., Disp: , Rfl:  .  albuterol (PROVENTIL) (2.5 MG/3ML) 0.083% nebulizer solution, Take 3 mLs (2.5 mg total) by nebulization every 6 (six) hours as needed for wheezing or shortness of breath., Disp: 75 mL, Rfl: 3 .  amiodarone (PACERONE) 100 MG tablet, Take 100 mg by mouth daily. , Disp: , Rfl:  .  aspirin 81 MG tablet, Take 81 mg by mouth daily., Disp: , Rfl:  .  atorvastatin (LIPITOR) 20 MG tablet, Take 1 tablet (20 mg total) by mouth daily., Disp: 90 tablet, Rfl: 1 .  b complex vitamins capsule, Take 1 capsule by mouth daily., Disp: , Rfl:  .  budesonide-formoterol (SYMBICORT) 160-4.5 MCG/ACT inhaler, Inhale 2 puffs into the lungs 2 (two) times daily. , Disp: , Rfl:  .  carvedilol (COREG) 6.25 MG tablet, Take 6.25 mg by mouth 2 (two) times daily with a meal., Disp: , Rfl:  .  Cholecalciferol (VITAMIN D) 2000 units tablet, Take 2,000 Units by  mouth daily., Disp: , Rfl:  .  diphenhydrAMINE (BENADRYL) 25 MG tablet, Take 25 mg by mouth every 6 (six) hours as needed for itching. , Disp: , Rfl:  .  donepezil (ARICEPT) 10 MG tablet, TAKE 1 TABLET AT BEDTIME, Disp: 90 tablet, Rfl: 3 .  DULoxetine (CYMBALTA) 30 MG capsule, TAKE 1 CAPSULE DAILY IN THE EVENING, Disp: 90 capsule, Rfl: 3 .  furosemide (LASIX) 20 MG tablet, Take 20 mg by mouth daily as needed. , Disp: , Rfl:  .  gabapentin (NEURONTIN) 600 MG tablet, Take 600 mg by mouth 2 (two) times daily., Disp: , Rfl:  .  glucose monitoring kit (FREESTYLE) monitoring kit, 1 each by Does not  apply route as needed for other., Disp: , Rfl:  .  HYDROcodone-acetaminophen (NORCO/VICODIN) 5-325 MG tablet, Take 1 tablet by mouth every 6 (six) hours as needed for moderate pain., Disp: , Rfl:  .  HYDROcodone-homatropine (HYDROMET) 5-1.5 MG/5ML syrup, Take 5 mLs by mouth every 6 (six) hours as needed. (Patient not taking: Reported on 08/24/2020), Disp: 120 mL, Rfl: 0 .  ipratropium (ATROVENT) 0.03 % nasal spray, Place 2 sprays into both nostrils every 12 (twelve) hours., Disp: , Rfl:  .  JARDIANCE 10 MG TABS tablet, TAKE 1 TABLET DAILY, Disp: 90 tablet, Rfl: 3 .  levocetirizine (XYZAL) 5 MG tablet, Take 5 mg by mouth every evening. , Disp: , Rfl:  .  levothyroxine (SYNTHROID) 50 MCG tablet, TAKE 1 TABLET DAILY, Disp: 90 tablet, Rfl: 3 .  lidocaine (LIDODERM) 5 %, Place 3 patches onto the skin daily. May use 1-3 patches at one time, Remove & Discard patch within 12 hours or as directed by MD, Disp: 90 patch, Rfl: 0 .  losartan (COZAAR) 50 MG tablet, Take 50 mg by mouth 2 (two) times daily. , Disp: , Rfl:  .  Magnesium 250 MG TABS, Take 1 tablet by mouth daily., Disp: , Rfl:  .  methocarbamol (ROBAXIN) 750 MG tablet, Take 750 mg by mouth 3 (three) times daily as needed for muscle spasms. , Disp: , Rfl:  .  mirabegron ER (MYRBETRIQ) 50 MG TB24 tablet, Take 50 mg by mouth daily., Disp: , Rfl:  .  montelukast (SINGULAIR) 10 MG tablet, Take 10 mg by mouth at bedtime., Disp: , Rfl:  .  Multiple Vitamin (MULTIVITAMIN) tablet, Take 1 tablet by mouth daily., Disp: , Rfl:  .  nitroGLYCERIN (NITROSTAT) 0.4 MG SL tablet, Place 0.4 mg under the tongue every 5 (five) minutes as needed for chest pain. , Disp: , Rfl:  .  Omega-3 Fatty Acids (FISH OIL) 1200 MG CAPS, Take 1,200 mg by mouth. , Disp: , Rfl:  .  pantoprazole (PROTONIX) 40 MG tablet, Take 1 tablet (40 mg total) by mouth daily., Disp: 90 tablet, Rfl: 1 .  pramipexole (MIRAPEX) 0.5 MG tablet, Take 2 tablets (1 mg total) by mouth in the morning and at  bedtime., Disp: 90 tablet, Rfl: 1 .  rivaroxaban (XARELTO) 10 MG TABS tablet, Take 10 mg by mouth daily., Disp: , Rfl:  .  Semaglutide,0.25 or 0.5MG /DOS, (OZEMPIC, 0.25 OR 0.5 MG/DOSE,) 2 MG/1.5ML SOPN, INJECT 0.5MG  UNDER THE SKIN EVERY WEEK, Disp: 45 mL, Rfl: 3 .  solifenacin (VESICARE) 5 MG tablet, Take 1 tablet (5 mg total) by mouth daily., Disp: 90 tablet, Rfl: 1 .  vitamin B-12 (CYANOCOBALAMIN) 1000 MCG tablet, Take 1,000 mcg by mouth daily., Disp: , Rfl:   Current Facility-Administered Medications:  .  cyanocobalamin ((VITAMIN B-12)) injection  1,000 mcg, 1,000 mcg, Intramuscular, Once, [name], MD   Allergies  Allergen Reactions  . No Known Allergies      Review of Systems  Constitutional: Negative.   Respiratory: Negative.  Negative for shortness of breath.   Cardiovascular: Negative.  Negative for palpitations.  Gastrointestinal: Negative.   Neurological: Negative.   Psychiatric/Behavioral: Positive for agitation and behavioral problems. The patient is nervous/anxious.      Today's Vitals   08/22/20 1515  BP: 118/78  Pulse: 82  Temp: 98.4 F (36.9 C)  SpO2: 99%  Weight: 239 lb 9.6 oz (108.7 kg)  Height: $Remove'5\' 7"'dBPYumH$  (1.702 m)  PainSc: 0-No pain   Body mass index is 37.53 kg/m.   Objective:  Physical Exam Vitals and nursing note reviewed.  Constitutional:      Appearance: Normal appearance.  HENT:     Head: Normocephalic and atraumatic.     Nose:     Comments: Masked     Mouth/Throat:     Comments: Masked  Eyes:     Extraocular Movements: Extraocular movements intact.  Cardiovascular:     Rate and Rhythm: Normal rate and regular rhythm.     Heart sounds: Normal heart sounds.  Pulmonary:     Effort: Pulmonary effort is normal.     Breath sounds: Normal breath sounds.  Musculoskeletal:     Cervical back: Normal range of motion.  Skin:    General: Skin is warm.  Neurological:     General: No focal deficit present.     Mental Status: He is alert.   Psychiatric:        Mood and Affect: Mood normal.         Assessment And Plan:     1. Inattention Comments: He agrees to proceed with formal ADD testing. His wife wants him referred to a specific therapist, will place referral as requested.  - Ambulatory referral to Psychology  2. Gambling disorder, moderate Comments: CBT is ideal treatment. I will place referral. Understandably so, he is hesitant to wean off of Mirapex for RLS.  - Ambulatory referral to Psychology  3. Hypertensive heart and renal disease with renal failure, stage 1 through stage 4 or unspecified chronic kidney disease, without heart failure Comments: Chronic, well controlled. He is encouraged to follow low sodium diet.   4. Chronic renal disease, stage II Comments: Chronic. Advised to keep BP/BS well controlled and to stay hydrated to decrease risk of progression of CKD.   5. Atherosclerosis of aorta (HCC) Chronic, encouraged to follow heart healthy lifestyle. Clean eating, regular exercise and stress management are imperative. He is also encouraged to continue with statin therapy.   6. Other fatigue Comments: He is encouraged to stay well hydrated, aim for at least 7-8 hours of sleep per night. Evidently, Neuro recently advised pt he did not have OSA. He now wants to   7. Lung nodule < 6cm on CT Comments: I will schedule him for chest CT for further evaluation. He does have h/o tobacco use d/o. - CT Chest Wo Contrast; Future  8. B12 deficiency Comments: I will check vitamin B12 level today. I will determine if injectable supplementation is needed once his results are available for review.  - cyanocobalamin ((VITAMIN B-12)) injection 1,000 mcg  9. Class 2 severe obesity due to excess calories with serious comorbidity and body mass index (BMI) of 37.0 to 37.9 in adult [facility]) Comments: He is encouraged to strive for BMI less than 30 to decrease cardiac  risk. ADvised to aim for at least 150 minutes of exercise per  week.   10. Drug therapy - Vitamin B12 - CBC no Diff  11. History of tobacco use   Patient was given opportunity to ask questions. Patient verbalized understanding of the plan and was able to repeat key elements of the plan. All questions were answered to their satisfaction.   I, [name], MD, have reviewed all documentation for this visit. The documentation on 08/22/20 for the exam, diagnosis, procedures, and orders are all accurate and complete.   IF YOU HAVE BEEN REFERRED TO A SPECIALIST, IT MAY TAKE 1-2 WEEKS TO SCHEDULE/PROCESS THE REFERRAL. IF YOU HAVE NOT HEARD FROM US/SPECIALIST IN TWO WEEKS, PLEASE GIVE [place] A CALL AT [phone].   THE PATIENT IS ENCOURAGED TO PRACTICE SOCIAL DISTANCING DUE TO THE COVID-19 PANDEMIC.

## 2020-08-23 ENCOUNTER — Telehealth: Payer: Self-pay

## 2020-08-23 LAB — CBC
Hematocrit: 47.7 % (ref 37.5–51.0)
Hemoglobin: 16.3 g/dL (ref 13.0–17.7)
MCH: 30.6 pg (ref 26.6–33.0)
MCHC: 34.2 g/dL (ref 31.5–35.7)
MCV: 90 fL (ref 79–97)
Platelets: 239 10*3/uL (ref 150–450)
RBC: 5.32 x10E6/uL (ref 4.14–5.80)
RDW: 13.1 % (ref 11.6–15.4)
WBC: 7.3 10*3/uL (ref 3.4–10.8)

## 2020-08-23 LAB — VITAMIN B12: Vitamin B-12: 795 pg/mL (ref 232–1245)

## 2020-08-23 NOTE — Chronic Care Management (AMB) (Signed)
Called patient for an appointment reminder with Franklin Thornton, [name] on 08-23-2020 at 1:00. Instructed patient to have all meds/supplements and logs available for review. Patient voiced understanding.  [place]  [phone]

## 2020-08-24 ENCOUNTER — Ambulatory Visit (INDEPENDENT_AMBULATORY_CARE_PROVIDER_SITE_OTHER): Payer: Medicare Other

## 2020-08-24 DIAGNOSIS — E785 Hyperlipidemia, unspecified: Secondary | ICD-10-CM | POA: Diagnosis not present

## 2020-08-24 DIAGNOSIS — I10 Essential (primary) hypertension: Secondary | ICD-10-CM | POA: Diagnosis not present

## 2020-08-24 DIAGNOSIS — G2581 Restless legs syndrome: Secondary | ICD-10-CM

## 2020-08-24 NOTE — Progress Notes (Signed)
Chronic Care Management Pharmacy Note  09/07/2020 Name:  Franklin Thornton MRN:  [id number] DOB:  04-Oct-1945  Subjective: Franklin Thornton is an 75 y.o. year old male who is a primary patient of [name], MD.  The CCM team was consulted for assistance with disease management and care coordination needs.  He plays pool everyday, and he has one at the house. He also goes to tournaments and things like that. 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 Patient scheduled to have catheretization completed in June at [facility] with Dr. Gerarda Gunther.   Engaged with patient by telephone for follow up visit in response to provider referral for pharmacy case management and/or care coordination services.   Consent to Services:  The patient was given information about Chronic Care Management services, agreed to services, and gave verbal consent prior to initiation of services.  Please see initial visit note for detailed documentation.   Patient Care Team: [name], MD as PCP - General (Internal Medicine) Gardiner Rhyme, MD as Referring Physician (Specialist) Clifton Custard, MD as Referring Physician (Internal Medicine) Warden Fillers, MD as Consulting Physician (Ophthalmology) Adrian Prows, MD as Consulting Physician (Cardiology) Cyril Mourning, [facility] (Inactive) (Pharmacist)  Recent office visits: 08/22/2020 PCP OV  07/31/2020 PCP OV   Recent consult visits: 08/18/2020 Cardiology OV 05/25/2020 Neurology OV  05/11/2020 Wound Care OV  04/26/2020 Opthalmology [facility] visits: None in previous 6 months  Objective:  Lab Results  Component Value Date   CREATININE 1.01 07/31/2020   BUN 24 07/31/2020   GFRNONAA 68 03/14/2020   GFRAA 79 03/14/2020   NA 143 07/31/2020   K 4.7 07/31/2020   CALCIUM 9.5 07/31/2020   CO2 24 07/31/2020   GLUCOSE 141 (H) 07/31/2020    Lab Results  Component Value Date/Time   HGBA1C 6.9 (H) 07/31/2020 04:19 PM   HGBA1C 7.0 (H) 03/14/2020 02:46 PM    MICROALBUR 30 07/30/2018 12:32 PM    Last diabetic Eye exam:  Lab Results  Component Value Date/Time   HMDIABEYEEXA No Retinopathy 04/26/2019 12:00 AM    Last diabetic Foot exam: No results found for: HMDIABFOOTEX   Lab Results  Component Value Date   CHOL 181 07/31/2020   HDL 47 07/31/2020   LDLCALC 85 07/31/2020   TRIG 298 (H) 07/31/2020   CHOLHDL 3.9 07/31/2020    Hepatic Function Latest Ref Rng & Units 07/31/2020 12/14/2019 11/18/2018  Total Protein 6.0 - 8.5 g/dL 6.9 6.7 6.8  Albumin 3.7 - 4.7 g/dL 3.9 3.9 4.1  AST 0 - 40 IU/L $Remov'23 22 20  'FuUjaR$ ALT 0 - 44 IU/L $Remov'23 20 21  'cTmquF$ Alk Phosphatase 44 - 121 IU/L 77 66 70  Total Bilirubin 0.0 - 1.2 mg/dL 0.4 0.6 0.3    Lab Results  Component Value Date/Time   TSH 1.240 07/31/2020 04:19 PM   TSH 1.220 12/30/2018 05:21 PM    CBC Latest Ref Rng & Units 08/22/2020 12/14/2019 09/21/2018  WBC 3.4 - 10.8 x10E3/uL 7.3 6.8 8.0  Hemoglobin 13.0 - 17.7 g/dL 16.3 15.4 15.8  Hematocrit 37.5 - 51.0 % 47.7 44.5 47.7  Platelets 150 - 450 x10E3/uL 239 202 239   Clinical ASCVD: Yes  The 10-year ASCVD risk score Mikey Bussing DC Jr., et al., 2013) is: 42.3%   Values used to calculate the score:     Age: 61 years     Sex: Male     Is Non-Hispanic African American: No  Diabetic: Yes     Tobacco smoker: No     Systolic Blood Pressure: 834 mmHg     Is BP treated: Yes     HDL Cholesterol: 47 mg/dL     Total Cholesterol: 181 mg/dL    Depression screen [facility] 2/9 11/24/2019 12/30/2018 11/18/2018  Decreased Interest 0 0 0  Down, Depressed, Hopeless 0 0 0  PHQ - 2 Score 0 0 0  Altered sleeping 3 - 3  Tired, decreased energy 3 - 3  Change in appetite 0 - 1  Feeling bad or failure about yourself  0 - 0  Trouble concentrating 0 - 0  Moving slowly or fidgety/restless 0 - 0  Suicidal thoughts 0 - 0  PHQ-9 Score 6 - 7  Difficult doing work/chores Not difficult at all - Not difficult at all     Social History   Tobacco Use  Smoking Status Former Smoker  .  Packs/day: 2.50  . Years: 12.00  . Pack years: 30.00  . Types: Cigarettes  . Start date: 04/23/1959  . Quit date: 08/14/1971  . Years since quitting: 49.1  Smokeless Tobacco Never Used  Tobacco Comment   he has quit   BP Readings from Last 3 Encounters:  08/22/20 118/78  07/31/20 118/70  03/14/20 120/74   Pulse Readings from Last 3 Encounters:  08/22/20 82  07/31/20 70  03/14/20 69   Wt Readings from Last 3 Encounters:  08/22/20 239 lb 9.6 oz (108.7 kg)  07/31/20 239 lb 6.4 oz (108.6 kg)  03/14/20 242 lb 9.6 oz (110 kg)   BMI Readings from Last 3 Encounters:  08/22/20 37.53 kg/m  07/31/20 36.72 kg/m  03/14/20 37.21 kg/m    Assessment/Interventions: Review of patient past medical history, allergies, medications, health status, including review of consultants reports, laboratory and other test data, was performed as part of comprehensive evaluation and provision of chronic care management services.   SDOH:  (Social Determinants of Health) assessments and interventions performed: No  SDOH Screenings   Alcohol Screen: Not on file  Depression (PHQ2-9): Medium Risk  . PHQ-2 Score: 6  Financial Resource Strain: Low Risk   . Difficulty of Paying Living Expenses: Not hard at all  Food Insecurity: No Food Insecurity  . Worried About Charity fundraiser in the Last Year: Never true  . Ran Out of Food in the Last Year: Never true  Housing: Not on file  Physical Activity: Inactive  . Days of Exercise per Week: 0 days  . Minutes of Exercise per Session: 0 min  Social Connections: Not on file  Stress: No Stress Concern Present  . Feeling of Stress : Not at all  Tobacco Use: Medium Risk  . Smoking Tobacco Use: Former Smoker  . Smokeless Tobacco Use: Never Used  Transportation Needs: No Transportation Needs  . Lack of Transportation (Medical): No  . Lack of Transportation (Non-Medical): No    CCM Care Plan  Allergies  Allergen Reactions  . No Known Allergies      Medications Reviewed Today    Reviewed by [name], MD (Physician) on 08/29/20 at 1833  Med List Status: <None>  Medication Order Taking? Sig Documenting Provider Last Dose Status Informant  albuterol (PROVENTIL HFA;VENTOLIN HFA) 108 (90 Base) MCG/ACT inhaler [id number] Yes Inhale 2 puffs into the lungs every 6 (six) hours as needed for wheezing or shortness of breath. [provider] Taking Active   albuterol (PROVENTIL) (2.5 MG/3ML) 0.083% nebulizer solution [id number] Yes Take 3  mLs (2.5 mg total) by nebulization every 6 (six) hours as needed for wheezing or shortness of breath. [name], FNP Taking Active   amiodarone (PACERONE) 100 MG tablet [id number] Yes Take 100 mg by mouth daily.  [provider] Taking Active   aspirin 81 MG tablet [id number] Yes Take 81 mg by mouth daily. [provider] Taking Active   atorvastatin (LIPITOR) 20 MG tablet [id number] Yes Take 1 tablet (20 mg total) by mouth daily. [name], MD Taking Active   b complex vitamins capsule [id number] Yes Take 1 capsule by mouth daily. [provider] Taking Active   budesonide-formoterol (SYMBICORT) 160-4.5 MCG/ACT inhaler [id number] Yes Inhale 2 puffs into the lungs 2 (two) times daily.  [provider] Taking Active   carvedilol (COREG) 6.25 MG tablet [id number] Yes Take 6.25 mg by mouth 2 (two) times daily with a meal. [provider] Taking Active   Cholecalciferol (VITAMIN D) 2000 units tablet [id number] Yes Take 2,000 Units by mouth daily. [provider] Taking Active   cyanocobalamin ((VITAMIN B-12)) injection 1,000 mcg [id number]   [name], MD  Active   diphenhydrAMINE (BENADRYL) 25 MG tablet [id number] Yes Take 25 mg by mouth every 6 (six) hours as needed for itching.  [provider] Taking Active   donepezil (ARICEPT) 10 MG tablet [id number] Yes TAKE 1 TABLET AT BEDTIME [name], MD Taking Active   DULoxetine (CYMBALTA) 30 MG  capsule [id number] Yes TAKE 1 CAPSULE DAILY IN THE Fredric Mare, MD Taking Active   furosemide (LASIX) 20 MG tablet [id number] Yes Take 20 mg by mouth daily as needed.  [provider] Taking Active   gabapentin (NEURONTIN) 600 MG tablet [id number] Yes Take 600 mg by mouth 2 (two) times daily. [provider] Taking Active   glucose monitoring kit (FREESTYLE) monitoring kit [id number] Yes 1 each by Does not apply route as needed for other. [provider] Taking Active   HYDROcodone-acetaminophen (NORCO/VICODIN) 5-325 MG tablet [id number] Yes Take 1 tablet by mouth every 6 (six) hours as needed for moderate pain. [provider] Taking Active   HYDROcodone-homatropine (HYDROMET) 5-1.5 MG/5ML syrup [id number] Yes Take 5 mLs by mouth every 6 (six) hours as needed.  Patient not taking: Reported on 08/24/2020   [name], FNP Taking Active   ipratropium (ATROVENT) 0.03 % nasal spray [id number] Yes Place 2 sprays into both nostrils every 12 (twelve) hours. [provider] Taking Active   JARDIANCE 10 MG TABS tablet [id number] Yes TAKE 1 TABLET DAILY [name], MD Taking Active   levocetirizine (XYZAL) 5 MG tablet [id number] Yes Take 5 mg by mouth every evening.  [provider] Taking Active   levothyroxine (SYNTHROID) 50 MCG tablet [id number] Yes TAKE 1 TABLET DAILY [name], MD Taking Active   lidocaine (LIDODERM) 5 % [id number] Yes Place 3 patches onto the skin daily. May use 1-3 patches at one time, Remove & Discard patch within 12 hours or as directed by MD [name], MD Taking Active   losartan (COZAAR) 50 MG tablet [id number] Yes Take 50 mg by mouth 2 (two) times daily.  [provider] Taking Active   Magnesium 250 MG TABS [id number] Yes Take 1 tablet by mouth daily. [provider] Taking Active   methocarbamol (ROBAXIN) 750 MG tablet [id number] Yes Take 750 mg by mouth 3 (three) times daily as needed for muscle  spasms.  [provider] Taking Active   mirabegron ER (MYRBETRIQ) 50 MG TB24 tablet  [id number] Yes Take 50 mg by mouth daily. [provider] Taking Active   montelukast (SINGULAIR) 10 MG tablet [id number] Yes Take 10 mg by mouth at bedtime. [provider] Taking Active   Multiple Vitamin (MULTIVITAMIN) tablet [id number] Yes Take 1 tablet by mouth daily. [provider] Taking Active   nitroGLYCERIN (NITROSTAT) 0.4 MG SL tablet [id number] Yes Place 0.4 mg under the tongue every 5 (five) minutes as needed for chest pain.  [provider] Taking Active   Omega-3 Fatty Acids (FISH OIL) 1200 MG CAPS [id number] Yes Take 1,200 mg by mouth.  [provider] Taking Active   pantoprazole (PROTONIX) 40 MG tablet [id number] Yes Take 1 tablet (40 mg total) by mouth daily. [name], MD Taking Active   pramipexole (MIRAPEX) 0.5 MG tablet [id number] Yes Take 2 tablets (1 mg total) by mouth in the morning and at bedtime. [name], MD Taking Active   rivaroxaban (XARELTO) 10 MG TABS tablet [id number] Yes Take 10 mg by mouth daily. [provider] Taking Active   Semaglutide,0.25 or 0.5MG /DOS, (OZEMPIC, 0.25 OR 0.5 MG/DOSE,) 2 MG/1.5ML SOPN [id number] Yes INJECT 0.5MG  UNDER THE SKIN EVERY WEEK [name], MD Taking Active   solifenacin (VESICARE) 5 MG tablet [id number] Yes Take 1 tablet (5 mg total) by mouth daily. [name], MD Taking Active   vitamin B-12 (CYANOCOBALAMIN) 1000 MCG tablet [id number] Yes Take 1,000 mcg by mouth daily. [provider] Taking Active           Patient Active Problem List   Diagnosis Date Noted  . Recurrent cellulitis of lower extremity 03/26/2020  . Atherosclerosis of aorta ([place]) 03/26/2020  . Class 2 severe obesity due to excess calories with serious comorbidity and body mass index (BMI) of 37.0 to 37.9 in adult [facility]) 03/26/2020  . PVD (peripheral vascular disease) ([place]) 12/30/2018  . Type 2 diabetes  mellitus with stage 2 chronic kidney disease, without long-term current use of insulin ([place]) 06/17/2018  . 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  . Atherosclerosis of native coronary artery of native heart without angina pectoris 06/17/2018  . Arrhythmia 11/18/2017  . Hyperlipidemia 11/18/2017  . Hypertension 11/18/2017  . Varicose veins of left lower extremity with complications 07/11/2246  . Pain in both lower extremities 08/14/2015  . Globus pharyngeus 07/17/2015  . Abdominal pain 12/12/2013  . History of carcinoma in situ of prostate 12/12/2013  . Male erectile dysfunction 12/12/2013  . Personal history of prostate cancer 12/12/2013  . OAB (overactive bladder) 11/03/2013  . Nocturia 11/03/2013  . Stress incontinence 11/03/2013  . Urinary incontinence 11/03/2013  . Headache associated with sexual activity 12/14/2012  . Memory loss 12/14/2012  . Myalgia and myositis 12/14/2012  . Obstructive sleep apnea 12/14/2012  . Insomnia 12/14/2012  . Partial epilepsy with impairment of consciousness, with intractable epilepsy ([place]) 12/14/2012  . Restless legs syndrome 12/14/2012  . Tension type headache 12/14/2012  . Dehydration, severe 07/11/2012  . Metabolic acidosis 25/00/3704  . Noninfective gastroenteritis and colitis 07/11/2012    Immunization History  Administered Date(s) Administered  . Fluad Quad(high Dose 65+) 03/14/2020  . H1N1 07/04/2008  . Influenza Split 02/20/2014  . Influenza, High Dose Seasonal PF 12/30/2018  . Influenza, Seasonal, Injecte, Preservative Fre 04/28/2015  . Influenza,inj,Quad PF,6+ Mos 05/04/2018  . Moderna SARS-COV2 Booster Vaccination 02/18/2020  . Moderna Sars-Covid-2 Vaccination 06/30/2019, 07/21/2019  . Pneumococcal Conjugate-13 03/10/2014  . Pneumococcal Polysaccharide-23 08/31/2014  . Pneumococcal-Unspecified 04/10/2010, 08/31/2014  . Tdap 03/10/2014, 11/03/2015  Conditions to be addressed/monitored:  Hypertension and  Diabetes  Care Plan : [place]  Updates made by Mayford Knife, RPH since 09/07/2020 12:00 AM    Problem: HTN, HLD, RESTLESS LEG SYNDROME, DM II     Long-Range Goal: Disease Management   Start Date: 08/24/2020  This Visit's Progress: On track  Priority: High  Note:    Current Barriers:  . Does not adhere to prescribed medication regimen  Pharmacist Clinical Goal(s):  [name] Patient will achieve adherence to monitoring guidelines and medication adherence to achieve therapeutic efficacy through collaboration with PharmD and provider.   Interventions: . 1:1 collaboration with [name], MD regarding development and update of comprehensive plan of care as evidenced by provider attestation and co-signature . Inter-disciplinary care team collaboration (see longitudinal plan of care) . Comprehensive medication review performed; medication list updated in electronic medical record  Hypertension (BP goal <130/80) -Controlled -Current treatment: . Carvedilol 6.25 mg tablet twice per day with a meal . Losartan 50 mg tablet two times daily -Denies hypotensive/hypertensive symptoms -Educated on BP goals and benefits of medications for prevention of heart attack, stroke and kidney damage; Daily salt intake goal < 2300 mg; Exercise goal of 150 minutes per week; Importance of home blood pressure monitoring; -Counseled to monitor BP at home at ;[place] three times per week, document, and provide log at future appointments -Recommended to continue current medication  Diabetes (A1c goal <7%) -Controlled -Current medications: . Jardiance 10 mg taking 1 tablet by mouth daily in the morning   -Current home glucose readings . fasting glucose: 100-180  -Denies hypoglycemic/hyperglycemic symptoms -Current meal patterns: 3 meals a day   . breakfast: eating biscuits and gravy twice a week - instead of eating three biscuits and a bowl of gravy he has cut it down to one biscuit and  covering his gravy, egg and toast, or cereal cheerios: whole milk . lunch: he is eating smaller portions of food  . dinner: eating a lot vegetables - cabbage (boiled, steamed), carrots, salads, rarely potatoes with steak or fish, do not eat a lot of fast food  . snacks: not eating a lot of snacks . drinks: whole milk - 1/2 gallon  -Current exercise: walking a lot around the pool table. He is getting ready to start an exercise regimen where you will be walking and using the weight equipment.  -He is working on making a space for him to work out.  -Patient reports missing his medication sometimes in the morning, when he does not eat breakfast  -Patient has an app that counts his steps when walks  -Educated on Exercise goal of 150 minutes per week; Benefits of weight loss; Carbohydrate counting and/or plate method  -[facility]commended [facility]  -Counseled to check feet daily and get yearly eye exams -Recommended to continue current medication  Hyperlipidemia: (LDL goal < 70) -Uncontrolled -Current treatment: . Atorvastatin 20 mg tablet once per day  . Xarelto 10 mg tablet once per day . Amiodarone 100 mg tablet once per day -Current exercise habits: Patient reports that he is not exercising frequently but he is interested in doing more.  -Educated on Cholesterol goals;  Benefits of statin for ASCVD risk reduction; Importance of limiting foods high in cholesterol; Exercise goal of 150 minutes per week; -Recommended to continue current medication -Collaborate with PCP team to increase Atorvastatin dosage, follow up with patient in 6 weeks to assess lipid panel and  chemistry panel and CBC.   Restless Leg Syndrome  -Controlled -Current treatment  . Mirapex 0.5 mg taking 1 tablet in the morning and 1 tablet at bedtime . Gabapentin 600 mg tablet twice daily as needed for restless leg syndrome.  -Patient reports the ticklish feeling at the bottom of  the feet increases, and then his legs starts to become irritation  -Patient reports that he is not sure if the medication is working  -Counseled on diet and exercise extensively Recommended patient collaborate with doctor to determine if changes to his medication need to be changed.   Health Maintenance -Vaccine gaps:   -COVID 19 Second Booster- scheduled                        -Scheduled booster shot for patient and his wife.  -Collaborated with patient to schedule COVID-19 second booster vaccine.   Patient Goals/Self-Care Activities . Patient will:  - take medications as prescribed  Follow Up Plan: Telephone follow up appointment with care management team member scheduled for:  10/03/2020 The patient has been provided with contact information for the care management team and has been advised to call with any health related questions or concerns.       Medication Assistance: None required.  Patient affirms current coverage meets needs.  Patient's preferred pharmacy is:  [place], [address] [place] [address] Phone: [phone] Fax: [facility] [address], [place] AT [facility] OF [address] [place] [place] [address] Phone: [phone] Fax: [phone]  Uses pill box? Yes Pt endorses 94% compliance  We discussed: Benefits of medication synchronization, packaging and delivery as well as enhanced pharmacist oversight with [employer]. Patient decided to: Continue current medication management strategy  Care Plan and Follow Up Patient Decision:  Patient agrees to Care Plan and Follow-up.  Plan: The patient has been provided with contact information for the care management team and has been advised to call with any health related questions or concerns.   Orlando Penner, PharmD Clinical Pharmacist [facility] [phone]

## 2020-08-29 ENCOUNTER — Encounter: Payer: Self-pay | Admitting: Internal Medicine

## 2020-08-30 ENCOUNTER — Telehealth: Payer: Self-pay

## 2020-08-30 ENCOUNTER — Other Ambulatory Visit: Payer: Self-pay

## 2020-08-30 DIAGNOSIS — G2581 Restless legs syndrome: Secondary | ICD-10-CM

## 2020-08-30 NOTE — Telephone Encounter (Signed)
I called the pt to get the name of the neurologist he would like to see.

## 2020-09-07 NOTE — Patient Instructions (Addendum)
Visit Information It was great speaking with you today!  Please let me know if you have any questions about our visit.  Goals Addressed            This Visit's Progress   . Manage My Medicine       Timeframe:  Long-Range Goal Priority:  High Start Date:                             Expected End Date:                       Follow Up Date 10/03/2020   - call for medicine refill 2 or 3 days before it runs out - call if I am sick and can't take my medicine - keep a list of all the medicines I take; vitamins and herbals too - use a pillbox to sort medicine - use an alarm clock or phone to remind me to take my medicine    Why is this important?   . These steps will help you keep on track with your medicines.        Patient Care Plan: CCM Pharmacy Care Plan    Problem Identified: HTN, HLD, RESTLESS LEG SYNDROME, DM II     Long-Range Goal: Disease Management   Start Date: 08/24/2020  This Visit's Progress: On track  Priority: High  Note:    Current Barriers:  . Does not adhere to prescribed medication regimen  Pharmacist Clinical Goal(s):  [name] Patient will achieve adherence to monitoring guidelines and medication adherence to achieve therapeutic efficacy through collaboration with PharmD and provider.   Interventions: . 1:1 collaboration with [name], MD regarding development and update of comprehensive plan of care as evidenced by provider attestation and co-signature . Inter-disciplinary care team collaboration (see longitudinal plan of care) . Comprehensive medication review performed; medication list updated in electronic medical record  Hypertension (BP goal <130/80) -Controlled -Current treatment: . Carvedilol 6.25 mg tablet twice per day with a meal . Losartan 50 mg tablet two times daily -Denies hypotensive/hypertensive symptoms -Educated on BP goals and benefits of medications for prevention of heart attack, stroke and kidney damage; Daily salt intake goal  < 2300 mg; Exercise goal of 150 minutes per week; Importance of home blood pressure monitoring; -Counseled to monitor BP at home at ;[place] three times per week, document, and provide log at future appointments -Recommended to continue current medication  Diabetes (A1c goal <7%) -Controlled -Current medications: . Jardiance 10 mg taking 1 tablet by mouth daily in the morning   -Current home glucose readings . fasting glucose: 100-180  -Denies hypoglycemic/hyperglycemic symptoms -Current meal patterns: 3 meals a day   . breakfast: eating biscuits and gravy twice a week - instead of eating three biscuits and a bowl of gravy he has cut it down to one biscuit and covering his gravy, egg and toast, or cereal cheerios: whole milk . lunch: he is eating smaller portions of food  . dinner: eating a lot vegetables - cabbage (boiled, steamed), carrots, salads, rarely potatoes with steak or fish, do not eat a lot of fast food  . snacks: not eating a lot of snacks . drinks: whole milk - 1/2 gallon  -Current exercise: walking a lot around the pool table. He is getting ready to start an exercise regimen where you will be walking and using the weight equipment.  -He is working on making  a space for him to work out.  -Patient reports missing his medication sometimes in the morning, when he does not eat breakfast  -Patient has an app that counts his steps when walks  -Educated on Exercise goal of 150 minutes per week; Benefits of weight loss; Carbohydrate counting and/or plate method  -Recommended that patient [facility]  -Counseled to check feet daily and get yearly eye exams -Recommended to continue current medication  Hyperlipidemia: (LDL goal < 70) -Uncontrolled -Current treatment: . Atorvastatin 20 mg tablet once per day  . Xarelto 10 mg tablet once per day . Amiodarone 100 mg tablet once per day -Current exercise habits: Patient reports that he is not  exercising frequently but he is interested in doing more.  -Educated on Cholesterol goals;  Benefits of statin for ASCVD risk reduction; Importance of limiting foods high in cholesterol; Exercise goal of 150 minutes per week; -Recommended to continue current medication -Collaborate with PCP team to increase Atorvastatin dosage, follow up with patient in 6 weeks to assess lipid panel and chemistry panel and CBC.   Restless Leg Syndrome  -Controlled -Current treatment  . Mirapex 0.5 mg taking 1 tablet in the morning and 1 tablet at bedtime . Gabapentin 600 mg tablet twice daily as needed for restless leg syndrome.  -Patient reports the ticklish feeling at the bottom of the feet increases, and then his legs starts to become irritation  -Patient reports that he is not sure if the medication is working  -Counseled on diet and exercise extensively Recommended patient collaborate with doctor to determine if changes to his medication need to be changed.   Health Maintenance -Vaccine gaps:   -COVID 19 Second Booster- scheduled                        -Scheduled booster shot for patient and his wife.  -Collaborated with patient to schedule COVID-19 second booster vaccine.   Patient Goals/Self-Care Activities . Patient will:  - take medications as prescribed  Follow Up Plan: Telephone follow up appointment with care management team member scheduled for:  10/03/2020 The patient has been provided with contact information for the care management team and has been advised to call with any health related questions or concerns.       Patient agreed to services and verbal consent obtained.   The patient verbalized understanding of instructions, educational materials, and care plan provided today and agreed to receive a mailed copy of patient instructions, educational materials, and care plan.   Orlando Penner, PharmD Clinical Pharmacist [facility] [phone]

## 2020-09-20 DIAGNOSIS — R079 Chest pain, unspecified: Secondary | ICD-10-CM | POA: Diagnosis not present

## 2020-09-20 DIAGNOSIS — Z79899 Other long term (current) drug therapy: Secondary | ICD-10-CM | POA: Diagnosis not present

## 2020-09-21 DIAGNOSIS — Z955 Presence of coronary angioplasty implant and graft: Secondary | ICD-10-CM | POA: Diagnosis not present

## 2020-09-21 DIAGNOSIS — Z79899 Other long term (current) drug therapy: Secondary | ICD-10-CM | POA: Diagnosis not present

## 2020-09-21 DIAGNOSIS — E782 Mixed hyperlipidemia: Secondary | ICD-10-CM | POA: Diagnosis not present

## 2020-09-21 DIAGNOSIS — I25119 Atherosclerotic heart disease of native coronary artery with unspecified angina pectoris: Secondary | ICD-10-CM | POA: Diagnosis not present

## 2020-09-21 DIAGNOSIS — Z7901 Long term (current) use of anticoagulants: Secondary | ICD-10-CM | POA: Diagnosis not present

## 2020-09-21 DIAGNOSIS — Z7982 Long term (current) use of aspirin: Secondary | ICD-10-CM | POA: Diagnosis not present

## 2020-09-21 DIAGNOSIS — E785 Hyperlipidemia, unspecified: Secondary | ICD-10-CM | POA: Diagnosis not present

## 2020-09-21 DIAGNOSIS — I2510 Atherosclerotic heart disease of native coronary artery without angina pectoris: Secondary | ICD-10-CM | POA: Diagnosis not present

## 2020-09-21 DIAGNOSIS — I10 Essential (primary) hypertension: Secondary | ICD-10-CM | POA: Diagnosis not present

## 2020-09-21 DIAGNOSIS — I4891 Unspecified atrial fibrillation: Secondary | ICD-10-CM | POA: Diagnosis not present

## 2020-09-25 ENCOUNTER — Institutional Professional Consult (permissible substitution): Payer: Medicare Other | Admitting: Neurology

## 2020-09-25 DIAGNOSIS — R911 Solitary pulmonary nodule: Secondary | ICD-10-CM | POA: Diagnosis not present

## 2020-09-25 DIAGNOSIS — R918 Other nonspecific abnormal finding of lung field: Secondary | ICD-10-CM | POA: Diagnosis not present

## 2020-09-26 ENCOUNTER — Telehealth: Payer: Self-pay

## 2020-09-26 NOTE — Telephone Encounter (Signed)
The pt was notified that Dr. Baird Cancer said that the pt's lung nodules are stable, lower lung nodules are unchanged.

## 2020-09-27 ENCOUNTER — Other Ambulatory Visit: Payer: Self-pay | Admitting: Internal Medicine

## 2020-09-27 DIAGNOSIS — N182 Chronic kidney disease, stage 2 (mild): Secondary | ICD-10-CM

## 2020-10-02 ENCOUNTER — Telehealth: Payer: Self-pay

## 2020-10-02 NOTE — Chronic Care Management (AMB) (Signed)
  OR: Patient aware of telephone appointment with Orlando Penner on 10-03-2020 at 1:30. Patient aware to have/bring all medications, supplements, blood pressure and/or blood sugar logs to visit.  Questions: Have you had any recent office visit or specialist visit outside of [place]? Patient stated no  Are there any concerns you would like to discuss during your office visit? Patient stated no  Are you having any problems obtaining your medications? (Whether it pharmacy issues or cost). Patient stated no  NOTES Patient states he has developed a cough and would like to be seen by PCP. Sent message to Michelle Nasuti CMA and Alfonso Ellis CMA.  Star Rating Drug: Atorvastatin 20 MG- Last filled 06-26-2020 [facility] Jardiance 10 MG- Last filled 09-28-2020 [facility] Losartan 50 MG- Last filled 09-28-2020 [facility]  Any gaps in medications fill history? No   [place] Pharmacist Assistant [phone]

## 2020-10-02 NOTE — Telephone Encounter (Signed)
The patient was scheduled a virtual appt evaluation of a cough

## 2020-10-03 ENCOUNTER — Ambulatory Visit (INDEPENDENT_AMBULATORY_CARE_PROVIDER_SITE_OTHER): Payer: Medicare Other | Admitting: Nurse Practitioner

## 2020-10-03 ENCOUNTER — Encounter: Payer: Self-pay | Admitting: Nurse Practitioner

## 2020-10-03 ENCOUNTER — Ambulatory Visit (INDEPENDENT_AMBULATORY_CARE_PROVIDER_SITE_OTHER): Payer: Medicare Other

## 2020-10-03 ENCOUNTER — Other Ambulatory Visit: Payer: Self-pay

## 2020-10-03 ENCOUNTER — Other Ambulatory Visit: Payer: Self-pay | Admitting: Nurse Practitioner

## 2020-10-03 DIAGNOSIS — Z87891 Personal history of nicotine dependence: Secondary | ICD-10-CM

## 2020-10-03 DIAGNOSIS — J40 Bronchitis, not specified as acute or chronic: Secondary | ICD-10-CM

## 2020-10-03 DIAGNOSIS — R059 Cough, unspecified: Secondary | ICD-10-CM

## 2020-10-03 DIAGNOSIS — N182 Chronic kidney disease, stage 2 (mild): Secondary | ICD-10-CM

## 2020-10-03 DIAGNOSIS — R062 Wheezing: Secondary | ICD-10-CM | POA: Diagnosis not present

## 2020-10-03 DIAGNOSIS — E1122 Type 2 diabetes mellitus with diabetic chronic kidney disease: Secondary | ICD-10-CM

## 2020-10-03 DIAGNOSIS — E785 Hyperlipidemia, unspecified: Secondary | ICD-10-CM

## 2020-10-03 DIAGNOSIS — I1310 Hypertensive heart and chronic kidney disease without heart failure, with stage 1 through stage 4 chronic kidney disease, or unspecified chronic kidney disease: Secondary | ICD-10-CM | POA: Diagnosis not present

## 2020-10-03 LAB — POC COVID19 BINAXNOW: SARS Coronavirus 2 Ag: NEGATIVE

## 2020-10-03 MED ORDER — BENZONATATE 100 MG PO CAPS: 100.0000 mg | ORAL_CAPSULE | Freq: Three times a day (TID) | ORAL | 0 refills | Status: AC | PRN

## 2020-10-03 MED ORDER — GUAIFENESIN-DM 100-10 MG/5ML PO SYRP: 5.0000 mL | ORAL_SOLUTION | ORAL | 0 refills | Status: DC | PRN

## 2020-10-03 MED ORDER — AMOXICILLIN-POT CLAVULANATE 875-125 MG PO TABS: 1.0000 | ORAL_TABLET | Freq: Two times a day (BID) | ORAL | 0 refills | Status: AC

## 2020-10-03 MED ORDER — PREDNISONE 10 MG PO TABS: 10.0000 mg | ORAL_TABLET | Freq: Every day | ORAL | 0 refills | Status: DC

## 2020-10-03 NOTE — Patient Instructions (Signed)

## 2020-10-03 NOTE — Progress Notes (Signed)
Chronic Care Management Pharmacy Note  10/19/2020 Name:  Franklin Thornton MRN:  [id number] DOB:  1946-02-10  Summary: Scheduled patient for COVID-19 vaccine booster.   Recommendations/Changes made from today's visit: Recommend patient check BP 3 times per week, log and bring readings to visit with PCP and cardiologist.  Recommend patient receive COVID-19 vaccine.   Plan: Patient to receive COVID -19 Vaccine booster Will start logging BP readings at least three times per week and bring with him to MD visits.    Subjective: Franklin Thornton is an 75 y.o. year old male who is a primary patient of [name], MD.  The CCM team was consulted for assistance with disease management and care coordination needs.    Engaged with patient by telephone for follow up visit in response to provider referral for pharmacy case management and/or care coordination services.   Consent to Services:  The patient was given information about Chronic Care Management services, agreed to services, and gave verbal consent prior to initiation of services.  Please see initial visit note for detailed documentation.   Patient Care Team: [name], MD as PCP - General (Internal Medicine) Gardiner Rhyme, MD as Referring Physician (Specialist) Clifton Custard, MD as Referring Physician (Internal Medicine) Warden Fillers, MD as Consulting Physician (Ophthalmology) Adrian Prows, MD as Consulting Physician (Cardiology)  Recent office visits: 08/22/2020 PCP OV   Recent consult visits: 09/21/2020 Cardiology [facility] visits: None in previous 6 months   Objective:  Lab Results  Component Value Date   CREATININE 1.01 07/31/2020   BUN 24 07/31/2020   GFRNONAA 68 03/14/2020   GFRAA 79 03/14/2020   NA 143 07/31/2020   K 4.7 07/31/2020   CALCIUM 9.5 07/31/2020   CO2 24 07/31/2020   GLUCOSE 141 (H) 07/31/2020    Lab Results  Component Value Date/Time   HGBA1C 6.9 (H) 07/31/2020 04:19 PM   HGBA1C  7.0 (H) 03/14/2020 02:46 PM   MICROALBUR 30 07/30/2018 12:32 PM    Last diabetic Eye exam:  Lab Results  Component Value Date/Time   HMDIABEYEEXA No Retinopathy 04/26/2019 12:00 AM    Last diabetic Foot exam: No results found for: HMDIABFOOTEX   Lab Results  Component Value Date   CHOL 181 07/31/2020   HDL 47 07/31/2020   LDLCALC 85 07/31/2020   TRIG 298 (H) 07/31/2020   CHOLHDL 3.9 07/31/2020    Hepatic Function Latest Ref Rng & Units 07/31/2020 12/14/2019 11/18/2018  Total Protein 6.0 - 8.5 g/dL 6.9 6.7 6.8  Albumin 3.7 - 4.7 g/dL 3.9 3.9 4.1  AST 0 - 40 IU/L $Remov'23 22 20  'tzdSvY$ ALT 0 - 44 IU/L $Remov'23 20 21  'RCWwWQ$ Alk Phosphatase 44 - 121 IU/L 77 66 70  Total Bilirubin 0.0 - 1.2 mg/dL 0.4 0.6 0.3    Lab Results  Component Value Date/Time   TSH 1.240 07/31/2020 04:19 PM   TSH 1.220 12/30/2018 05:21 PM    CBC Latest Ref Rng & Units 08/22/2020 12/14/2019 09/21/2018  WBC 3.4 - 10.8 x10E3/uL 7.3 6.8 8.0  Hemoglobin 13.0 - 17.7 g/dL 16.3 15.4 15.8  Hematocrit 37.5 - 51.0 % 47.7 44.5 47.7  Platelets 150 - 450 x10E3/uL 239 202 239    No results found for: VD25OH  Clinical ASCVD: Yes  The 10-year ASCVD risk score Mikey Bussing DC Jr., et al., 2013) is: 51%   Values used to calculate the score:     Age: 75 years     Sex: Male  Is Non-Hispanic African American: No     Diabetic: Yes     Tobacco smoker: Yes     Systolic Blood Pressure: 099 mmHg     Is BP treated: Yes     HDL Cholesterol: 47 mg/dL     Total Cholesterol: 181 mg/dL    Depression screen [facility] 2/9 11/24/2019 12/30/2018 11/18/2018  Decreased Interest 0 0 0  Down, Depressed, Hopeless 0 0 0  PHQ - 2 Score 0 0 0  Altered sleeping 3 - 3  Tired, decreased energy 3 - 3  Change in appetite 0 - 1  Feeling bad or failure about yourself  0 - 0  Trouble concentrating 0 - 0  Moving slowly or fidgety/restless 0 - 0  Suicidal thoughts 0 - 0  PHQ-9 Score 6 - 7  Difficult doing work/chores Not difficult at all - Not difficult at all      Social  History   Tobacco Use  Smoking Status Former   Packs/day: 2.50   Years: 12.00   Pack years: 30.00   Types: Cigarettes   Start date: 04/23/1959   Quit date: 08/14/1971   Years since quitting: 49.2  Smokeless Tobacco Never  Tobacco Comments   he has quit   BP Readings from Last 3 Encounters:  10/19/20 130/76  08/22/20 118/78  07/31/20 118/70   Pulse Readings from Last 3 Encounters:  10/19/20 61  08/22/20 82  07/31/20 70   Wt Readings from Last 3 Encounters:  10/19/20 242 lb (109.8 kg)  08/22/20 239 lb 9.6 oz (108.7 kg)  07/31/20 239 lb 6.4 oz (108.6 kg)   BMI Readings from Last 3 Encounters:  10/19/20 34.72 kg/m  08/22/20 37.53 kg/m  07/31/20 36.72 kg/m    Assessment/Interventions: Review of patient past medical history, allergies, medications, health status, including review of consultants reports, laboratory and other test data, was performed as part of comprehensive evaluation and provision of chronic care management services.   SDOH:  (Social Determinants of Health) assessments and interventions performed: No  SDOH Screenings   Alcohol Screen: Not on file  Depression (PHQ2-9): Medium Risk   PHQ-2 Score: 6  Financial Resource Strain: Low Risk    Difficulty of Paying Living Expenses: Not hard at all  Food Insecurity: No Food Insecurity   Worried About Charity fundraiser in the Last Year: Never true   Ran Out of Food in the Last Year: Never true  Housing: Not on file  Physical Activity: Inactive   Days of Exercise per Week: 0 days   Minutes of Exercise per Session: 0 min  Social Connections: Not on file  Stress: No Stress Concern Present   Feeling of Stress : Not at all  Tobacco Use: Medium Risk   Smoking Tobacco Use: Former   Smokeless Tobacco Use: Never  Transportation Needs: No Transportation Needs   Lack of Transportation (Medical): No   Lack of Transportation (Non-Medical): No    CCM Care Plan  Allergies  Allergen Reactions   No Known  Allergies     Medications Reviewed Today     Reviewed by Star Age, MD (Physician) on 10/19/20 at 0900  Med List Status: <None>   Medication Order Taking? Sig Documenting Provider Last Dose Status Informant  albuterol (PROVENTIL HFA;VENTOLIN HFA) 108 (90 Base) MCG/ACT inhaler [id number] No Inhale 2 puffs into the lungs every 6 (six) hours as needed for wheezing or shortness of breath. [provider] Taking Active   albuterol (PROVENTIL) (2.5 MG/3ML) 0.083%  nebulizer solution [id number] No Take 3 mLs (2.5 mg total) by nebulization every 6 (six) hours as needed for wheezing or shortness of breath. [name], FNP Taking Active   amiodarone (PACERONE) 100 MG tablet [id number] No Take 100 mg by mouth daily.  [provider] Taking Active   aspirin 81 MG tablet [id number] No Take 81 mg by mouth daily. [provider] Taking Active   atorvastatin (LIPITOR) 20 MG tablet [id number] No Take 1 tablet (20 mg total) by mouth daily. [name], MD Taking Active   b complex vitamins capsule [id number] No Take 1 capsule by mouth daily. [provider] Taking Active   benzonatate (TESSALON PERLES) 100 MG capsule [id number]  Take 1 capsule (100 mg total) by mouth 3 (three) times daily as needed for cough. Bary Castilla, NP  Active   budesonide-formoterol [facility]) 160-4.5 MCG/ACT inhaler [id number] No Inhale 2 puffs into the lungs 2 (two) times daily.  [provider] Taking Active   carvedilol (COREG) 6.25 MG tablet [id number] No Take 6.25 mg by mouth 2 (two) times daily with a meal. [provider] Taking Active   Cholecalciferol (VITAMIN D) 2000 units tablet [id number] No Take 2,000 Units by mouth daily. [provider] Taking Active   cyanocobalamin ((VITAMIN B-12)) injection 1,000 mcg [id number]   [name], MD  Active   diphenhydrAMINE (BENADRYL) 25 MG tablet [id number] No Take 25 mg by mouth every 6 (six) hours as needed for itching.   [provider] Taking Active   donepezil (ARICEPT) 10 MG tablet [id number] No TAKE 1 TABLET AT BEDTIME [name], MD Taking Active   DULoxetine (CYMBALTA) 30 MG capsule [id number] No TAKE 1 CAPSULE DAILY IN THE Fredric Mare, MD Taking Active   furosemide (LASIX) 20 MG tablet [id number] No Take 20 mg by mouth daily as needed.  [provider] Taking Active   gabapentin (NEURONTIN) 600 MG tablet [id number] No Take 600 mg by mouth 2 (two) times daily. [provider] Taking Active   glucose monitoring kit (FREESTYLE) monitoring kit [id number] No 1 each by Does not apply route as needed for other. [provider] Taking Active   guaiFENesin-dextromethorphan (ROBITUSSIN DM) 100-10 MG/5ML syrup [id number]  Take 5 mLs by mouth every 4 (four) hours as needed for cough. Bary Castilla, NP  Active   HYDROcodone-acetaminophen (NORCO/VICODIN) 5-325 MG tablet [id number] No Take 1 tablet by mouth every 6 (six) hours as needed for moderate pain. [provider] Taking Active   HYDROcodone-homatropine (HYDROMET) 5-1.5 MG/5ML syrup [id number] No Take 5 mLs by mouth every 6 (six) hours as needed.  Patient not taking: Reported on 08/24/2020   [name], FNP Not Taking Active   ipratropium (ATROVENT) 0.03 % nasal spray [id number] No Place 2 sprays into both nostrils every 12 (twelve) hours. [provider] Taking Active   JARDIANCE 10 MG TABS tablet [id number]  TAKE 1 TABLET DAILY [name], MD  Active   levocetirizine (XYZAL) 5 MG tablet [id number] No Take 5 mg by mouth every evening.  [provider] Taking Active   lidocaine (LIDODERM) 5 % [id number] No Place 3 patches onto the skin daily. May use 1-3 patches at one time, Remove & Discard patch within 12 hours or as directed by MD [name], MD Taking Active   losartan (COZAAR) 50 MG tablet [id number] No Take 50 mg by mouth 2 (two) times daily.  [provider] Taking Active    Magnesium 250 MG TABS [id number] No Take  1 tablet by mouth daily. [provider] Taking Active   methocarbamol (ROBAXIN) 750 MG tablet [id number] No Take 750 mg by mouth 3 (three) times daily as needed for muscle spasms.  [provider] Taking Active   mirabegron ER (MYRBETRIQ) 50 MG TB24 tablet [id number] No Take 50 mg by mouth daily. [provider] Taking Active   montelukast (SINGULAIR) 10 MG tablet [id number] No Take 10 mg by mouth at bedtime. [provider] Taking Active   Multiple Vitamin (MULTIVITAMIN) tablet [id number] No Take 1 tablet by mouth daily. [provider] Taking Active   nitroGLYCERIN (NITROSTAT) 0.4 MG SL tablet [id number] No Place 0.4 mg under the tongue every 5 (five) minutes as needed for chest pain.  [provider] Taking Active   Omega-3 Fatty Acids (FISH OIL) 1200 MG CAPS [id number] No Take 1,200 mg by mouth.  [provider] Taking Active   pantoprazole (PROTONIX) 40 MG tablet [id number] No Take 1 tablet (40 mg total) by mouth daily. [name], MD Taking Active   pramipexole (MIRAPEX) 0.5 MG tablet [id number] No Take 2 tablets (1 mg total) by mouth in the morning and at bedtime. [name], MD Taking Active   predniSONE (DELTASONE) 10 MG tablet [id number]  Take 1 tablet (10 mg total) by mouth daily with breakfast. Dan Europe, Ramandeep, NP  Active   rivaroxaban (XARELTO) 10 MG TABS tablet [id number] No Take 10 mg by mouth daily. [provider] Taking Active   Semaglutide,0.25 or 0.5MG /DOS, (OZEMPIC, 0.25 OR 0.5 MG/DOSE,) 2 MG/1.5ML SOPN [id number] No INJECT 0.5MG  UNDER THE SKIN EVERY WEEK [name], MD Taking Active   solifenacin (VESICARE) 5 MG tablet [id number] No Take 1 tablet (5 mg total) by mouth daily. [name], MD Taking Active   SYNTHROID 50 MCG tablet [id number]  TAKE 1 TABLET DAILY [name], MD  Active   vitamin B-12 (CYANOCOBALAMIN) 1000 MCG tablet [id number] No Take 1,000 mcg by mouth  daily. [provider] Taking Active             Patient Active Problem List   Diagnosis Date Noted   Recurrent cellulitis of lower extremity 03/26/2020   Atherosclerosis of aorta ([place]) 03/26/2020   Class 2 severe obesity due to excess calories with serious comorbidity and body mass index (BMI) of 37.0 to 37.9 in adult [facility]) 03/26/2020   PVD (peripheral vascular disease) ([place]) 12/30/2018   Type 2 diabetes mellitus with stage 2 chronic kidney disease, without long-term current use of insulin ([place]) 06/17/2018   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   Atherosclerosis of native coronary artery of native heart without angina pectoris 06/17/2018   Arrhythmia 11/18/2017   Hyperlipidemia 11/18/2017   Hypertension 11/18/2017   Varicose veins of left lower extremity with complications 94/85/4627   Pain in both lower extremities 08/14/2015   Globus pharyngeus 07/17/2015   Abdominal pain 12/12/2013   History of carcinoma in situ of prostate 12/12/2013   Male erectile dysfunction 12/12/2013   Personal history of prostate cancer 12/12/2013   OAB (overactive bladder) 11/03/2013   Nocturia 11/03/2013   Stress incontinence 11/03/2013   Urinary incontinence 11/03/2013   Headache associated with sexual activity 12/14/2012   Memory loss 12/14/2012   Myalgia and myositis 12/14/2012   Obstructive sleep apnea 12/14/2012   Insomnia 12/14/2012   Partial epilepsy with impairment of consciousness, with intractable epilepsy ([place]) 12/14/2012   Restless legs syndrome 12/14/2012   Tension type headache 12/14/2012   Dehydration, severe 03/50/0938   Metabolic  acidosis 07/11/2012   Noninfective gastroenteritis and colitis 07/11/2012    Immunization History  Administered Date(s) Administered   Fluad Quad(high Dose 65+) 03/14/2020   H1N1 07/04/2008   Influenza Split 02/20/2014   Influenza, High Dose Seasonal PF 12/30/2018   Influenza, Seasonal, Injecte, Preservative Fre 04/28/2015    Influenza,inj,Quad PF,6+ Mos 05/04/2018   Moderna SARS-COV2 Booster Vaccination 02/18/2020   Moderna Sars-Covid-2 Vaccination 06/30/2019, 07/21/2019   Pneumococcal Conjugate-13 03/10/2014   Pneumococcal Polysaccharide-23 08/31/2014   Pneumococcal-Unspecified 04/10/2010, 08/31/2014   Tdap 03/10/2014, 11/03/2015    Conditions to be addressed/monitored:  Hypertension and Hyperlipidemia  Care Plan : [place]  Updates made by Mayford Knife, [place] since 10/19/2020 12:00 AM     Problem: HTN, HLD, RESTLESS LEG SYNDROME, DM II      Long-Range Goal: Disease Management   Start Date: 08/24/2020  Recent Progress: On track  Priority: High  Note:    Current Barriers:  Does not adhere to prescribed medication regimen  Pharmacist Clinical Goal(s):  Patient will achieve adherence to monitoring guidelines and medication adherence to achieve therapeutic efficacy through collaboration with PharmD and provider.   Interventions: 1:1 collaboration with [name], MD regarding development and update of comprehensive plan of care as evidenced by provider attestation and co-signature Inter-disciplinary care team collaboration (see longitudinal plan of care) Comprehensive medication review performed; medication list updated in electronic medical record  Hypertension (BP goal <130/80) -Controlled -Current treatment: Carvedilol 6.25 mg tablet twice per day with a meal Losartan 50 mg tablet two times daily -Denies hypotensive/hypertensive symptoms -Educated on BP goals and benefits of medications for prevention of heart attack, stroke and kidney damage; Daily salt intake goal < 2300 mg; Exercise goal of 150 minutes per week; Importance of home blood pressure monitoring; -Counseled to monitor BP at home at ;[place] three times per week, document, and provide log at future appointments -Recommended to continue current medication  Diabetes (A1c goal <7%) -Controlled -Current  medications: Jardiance 10 mg taking 1 tablet by mouth daily in the morning   -Current home glucose readings fasting glucose: 100-180  -Denies hypoglycemic/hyperglycemic symptoms -Current meal patterns: 3 meals a day   breakfast: eating biscuits and gravy twice a week - instead of eating three biscuits and a bowl of gravy he has cut it down to one biscuit and covering his gravy, egg and toast, or cereal cheerios: whole milk lunch: he is eating smaller portions of food  dinner: eating a lot vegetables - cabbage (boiled, steamed), carrots, salads, rarely potatoes with steak or fish, do not eat a lot of fast food  snacks: not eating a lot of snacks drinks: whole milk - 1/2 gallon, going to increase water -Current exercise: walking a lot around the pool table. He is getting ready to start an exercise regimen where you will be walking and using the weight equipment.  -He is working on making a space for him to work out.  -Patient reports missing his medication sometimes in the morning, when he does not eat breakfast  -Patient has an app that counts his steps when walks  -Educated on Exercise goal of 150 minutes per week; Benefits of weight loss; Carbohydrate counting and/or plate method  -[facility]  -Counseled to check feet daily and get yearly eye exams -Recommended to continue current medication  Hyperlipidemia: (LDL goal < 70) -Uncontrolled -Current treatment: Atorvastatin 20 mg tablet once per day  Xarelto 10 mg tablet  once per day Amiodarone 100 mg tablet once per day -Current exercise habits: Patient reports that he is not exercising frequently but he is interested in doing more.  -Educated on Cholesterol goals;  Benefits of statin for ASCVD risk reduction; Importance of limiting foods high in cholesterol; Exercise goal of 150 minutes per week; -Recommended to continue current medication -Collaborate with PCP team to  increase Atorvastatin dosage, follow up with patient in 6 weeks to assess lipid panel and chemistry panel and CBC.   Restless Leg Syndrome  -Controlled -Current treatment  Mirapex 0.5 mg taking 1 tablet in the morning and 1 tablet at bedtime Gabapentin 600 mg tablet twice daily as needed for restless leg syndrome.  -Patient reports the ticklish feeling at the bottom of the feet increases, and then his legs starts to become irritation  -Patient reports that he is not sure if the medication is working  -Counseled on diet and exercise extensively Recommended patient collaborate with doctor to determine if changes to his medication need to be changed.   Health Maintenance -Vaccine gaps:   -COVID 19 Second Booster- scheduled                        -Scheduled booster shot for patient and his wife.  -Collaborated with patient to schedule COVID-19 second booster vaccine.   Patient Goals/Self-Care Activities Patient will:  - take medications as prescribed  Follow Up Plan: Telephone follow up appointment with care management team member scheduled for:  03/20/2021 The patient has been provided with contact information for the care management team and has been advised to call with any health related questions or concerns.        Medication Assistance: None required.  Patient affirms current coverage meets needs.  Compliance/Adherence/Medication fill history: Care Gaps: Opthamology Exam COVID-19 Vaccine booster  Star-Rating Drugs: Atorvastatin 20 mg tablet  Jardiance 10 mg tablet daily  Patient's preferred pharmacy is:  [place], [place] [address] [place] [address] Phone: [phone] Fax: [phone]  [place] [address] [place] AT [facility] [address] [place] [address] Phone: [phone] Fax: [phone]  [facility] [address]  [place], [place] [address] Phone: [phone] Fax: [phone]  Uses pill box? No - patient keeps his medications in individual bottles  Pt endorses 85% compliance  We discussed: Current pharmacy is preferred with insurance plan and patient is satisfied with pharmacy services Patient decided to: Continue current medication management strategy  Care Plan and Follow Up Patient Decision:  Patient agrees to Care Plan and Follow-up.  Plan: The patient has been provided with contact information for the care management team and has been advised to call with any health related questions or concerns.   Orlando Penner, PharmD Clinical Pharmacist [facility] [phone]     Current Barriers:  Unable to independently monitor therapeutic efficacy  Pharmacist Clinical Goal(s):  Patient will achieve adherence to monitoring guidelines and medication adherence to achieve therapeutic efficacy through collaboration with PharmD and provider.   Interventions: 1:1 collaboration with [name], MD regarding development and update of comprehensive plan of care as evidenced by provider attestation and co-signature Inter-disciplinary care team collaboration (see longitudinal plan of care) Comprehensive medication review performed; medication  list updated in electronic medical record  Hypertension (BP goal <130/80) -Controlled -Current treatment: Carvedilol 6.25 mg tablet twice per day with a meal Losartan 50 mg tablet two times daily -Denies hypotensive/hypertensive symptoms -Educated on BP goals and benefits of medications for prevention of heart attack, stroke and kidney damage; Daily salt intake goal < 2300 mg; Exercise goal of 150 minutes per week; Currently checking BP at home: 120/80  Importance of home blood pressure monitoring; -Counseled to monitor BP at home at ;[place] three  times per week, document, and provide log at future appointments -Recommended to continue current medication  Diabetes (A1c goal <7%) -Controlled -Current medications: Jardiance 10 mg taking 1 tablet by mouth daily in the morning   -Current home glucose readings fasting glucose: 100-180 -Denies hypoglycemic/hyperglycemic symptoms -Current meal patterns: 3 meals a day   breakfast: eating biscuits and gravy twice a week - instead of eating three biscuits and a bowl of gravy he has cut it down to one biscuit and covering his gravy, egg and toast, or cereal cheerios: whole milk lunch: he is eating smaller portions of food dinner: eating a lot vegetables - cabbage (boiled, steamed), carrots, salads, rarely potatoes with steak or fish, do not eat a lot of fast food snacks: not eating a lot of snacks drinks: whole milk - 1/2 gallon,  -Current exercise: walking a lot around the pool table. He is getting ready to start an exercise regimen where you will be walking and using the weight equipment. -He is working on making a space for him to work out. -Patient reports missing his medication sometimes in the morning, when he does not eat breakfast -Patient has an app that counts his steps when walks -Educated on Exercise goal of 150 minutes per week; Benefits of weight loss; Carbohydrate counting and/or plate method -Recommended that patient [facility] -Counseled to check feet daily and get yearly eye exams -Recommended to continue current medication   Hyperlipidemia: (LDL goal < 70) -Uncontrolled -Current treatment: Atorvastatin 20 mg tablet once per day Xarelto 10 mg tablet once per day Amiodarone 100 mg tablet once per day -Current exercise habits: patient has been more active doing yard work and things around the house.  -Educated on Cholesterol goals; Benefits of statin for ASCVD risk reduction; Importance of limiting foods high in cholesterol; Exercise  goal of 150 minutes per week; -Recommended to continue current medication -Collaborate with PCP team to increase Atorvastatin dosage, follow up with patient in 6 weeks to assess lipid panel and chemistry panel and CBC.   Health Maintenance -Vaccine gaps:   -COVID-19 Vaccine (Confirmation number:  [id number])  Patient Goals/Self-Care Activities Patient will:  - take medications as prescribed  Follow Up Plan: The patient has been provided with contact information for the care management team and has been advised to call with any health related questions or concerns.

## 2020-10-03 NOTE — Progress Notes (Signed)
I,Tianna Badgett,acting as a Education administrator for [employer], NP.,have documented all relevant documentation on the behalf of [employer], NP,as directed by  Bary Castilla, NP while in the presence of Bary Castilla, NP.  This visit occurred during the SARS-CoV-2 public health emergency.  Safety protocols were in place, including screening questions prior to the visit, additional usage of staff PPE, and extensive cleaning of exam room while observing appropriate contact time as indicated for disinfecting solutions.  Subjective:     Patient ID: Franklin Thornton , male    DOB: 06-06-1945 , 75 y.o.   MRN: [id number]   Chief Complaint  Patient presents with   Cough    HPI  Patient is here for cough. The patient has been having cough for almost one month now. He does not have any SOB or chest pain but does have wheezing at times. He does have asthma and uses his albuterol as needed.  He does not have any fever, chills or bodyaches.   Cough Associated symptoms include wheezing. Pertinent negatives include no chest pain, chills, fever, headaches, myalgias, rhinorrhea or shortness of breath.    Past Medical History:  Diagnosis Date   Atrial fibrillation ([place])    Diabetes mellitus without complication ([place])    Peripheral vascular disease ([place])    Prostate cancer ([place])    Uvular edema 07/17/2015     Family History  Problem Relation Age of Onset   Lung cancer Mother    Diabetes Mother    Esophageal cancer Father    Hypertension Sister    Breast cancer Sister    Healthy Son    Healthy Daughter      Current Outpatient Medications:    amoxicillin-clavulanate (AUGMENTIN) 875-125 MG tablet, Take 1 tablet by mouth 2 (two) times daily for 7 days., Disp: 14 tablet, Rfl: 0   benzonatate (TESSALON PERLES) 100 MG capsule, Take 1 capsule (100 mg total) by mouth 3 (three) times daily as needed for cough., Disp: 30 capsule, Rfl: 0   guaiFENesin-dextromethorphan (ROBITUSSIN DM) 100-10  MG/5ML syrup, Take 5 mLs by mouth every 4 (four) hours as needed for cough., Disp: 118 mL, Rfl: 0   predniSONE (DELTASONE) 10 MG tablet, Take 1 tablet (10 mg total) by mouth daily with breakfast., Disp: 6 tablet, Rfl: 0   albuterol (PROVENTIL HFA;VENTOLIN HFA) 108 (90 Base) MCG/ACT inhaler, Inhale 2 puffs into the lungs every 6 (six) hours as needed for wheezing or shortness of breath., Disp: , Rfl:    albuterol (PROVENTIL) (2.5 MG/3ML) 0.083% nebulizer solution, Take 3 mLs (2.5 mg total) by nebulization every 6 (six) hours as needed for wheezing or shortness of breath., Disp: 75 mL, Rfl: 3   amiodarone (PACERONE) 100 MG tablet, Take 100 mg by mouth daily. , Disp: , Rfl:    aspirin 81 MG tablet, Take 81 mg by mouth daily., Disp: , Rfl:    atorvastatin (LIPITOR) 20 MG tablet, Take 1 tablet (20 mg total) by mouth daily., Disp: 90 tablet, Rfl: 1   b complex vitamins capsule, Take 1 capsule by mouth daily., Disp: , Rfl:    budesonide-formoterol (SYMBICORT) 160-4.5 MCG/ACT inhaler, Inhale 2 puffs into the lungs 2 (two) times daily. , Disp: , Rfl:    carvedilol (COREG) 6.25 MG tablet, Take 6.25 mg by mouth 2 (two) times daily with a meal., Disp: , Rfl:    Cholecalciferol (VITAMIN D) 2000 units tablet, Take 2,000 Units by mouth daily., Disp: , Rfl:    diphenhydrAMINE (BENADRYL) 25  MG tablet, Take 25 mg by mouth every 6 (six) hours as needed for itching. , Disp: , Rfl:    donepezil (ARICEPT) 10 MG tablet, TAKE 1 TABLET AT BEDTIME, Disp: 90 tablet, Rfl: 3   DULoxetine (CYMBALTA) 30 MG capsule, TAKE 1 CAPSULE DAILY IN THE EVENING, Disp: 90 capsule, Rfl: 3   furosemide (LASIX) 20 MG tablet, Take 20 mg by mouth daily as needed. , Disp: , Rfl:    gabapentin (NEURONTIN) 600 MG tablet, Take 600 mg by mouth 2 (two) times daily., Disp: , Rfl:    glucose monitoring kit (FREESTYLE) monitoring kit, 1 each by Does not apply route as needed for other., Disp: , Rfl:    HYDROcodone-acetaminophen (NORCO/VICODIN) 5-325 MG  tablet, Take 1 tablet by mouth every 6 (six) hours as needed for moderate pain., Disp: , Rfl:    HYDROcodone-homatropine (HYDROMET) 5-1.5 MG/5ML syrup, Take 5 mLs by mouth every 6 (six) hours as needed. (Patient not taking: Reported on 08/24/2020), Disp: 120 mL, Rfl: 0   ipratropium (ATROVENT) 0.03 % nasal spray, Place 2 sprays into both nostrils every 12 (twelve) hours., Disp: , Rfl:    JARDIANCE 10 MG TABS tablet, TAKE 1 TABLET DAILY, Disp: 90 tablet, Rfl: 3   levocetirizine (XYZAL) 5 MG tablet, Take 5 mg by mouth every evening. , Disp: , Rfl:    lidocaine (LIDODERM) 5 %, Place 3 patches onto the skin daily. May use 1-3 patches at one time, Remove & Discard patch within 12 hours or as directed by MD, Disp: 90 patch, Rfl: 0   losartan (COZAAR) 50 MG tablet, Take 50 mg by mouth 2 (two) times daily. , Disp: , Rfl:    Magnesium 250 MG TABS, Take 1 tablet by mouth daily., Disp: , Rfl:    methocarbamol (ROBAXIN) 750 MG tablet, Take 750 mg by mouth 3 (three) times daily as needed for muscle spasms. , Disp: , Rfl:    mirabegron ER (MYRBETRIQ) 50 MG TB24 tablet, Take 50 mg by mouth daily., Disp: , Rfl:    montelukast (SINGULAIR) 10 MG tablet, Take 10 mg by mouth at bedtime., Disp: , Rfl:    Multiple Vitamin (MULTIVITAMIN) tablet, Take 1 tablet by mouth daily., Disp: , Rfl:    nitroGLYCERIN (NITROSTAT) 0.4 MG SL tablet, Place 0.4 mg under the tongue every 5 (five) minutes as needed for chest pain. , Disp: , Rfl:    Omega-3 Fatty Acids (FISH OIL) 1200 MG CAPS, Take 1,200 mg by mouth. , Disp: , Rfl:    pantoprazole (PROTONIX) 40 MG tablet, Take 1 tablet (40 mg total) by mouth daily., Disp: 90 tablet, Rfl: 1   pramipexole (MIRAPEX) 0.5 MG tablet, Take 2 tablets (1 mg total) by mouth in the morning and at bedtime., Disp: 90 tablet, Rfl: 1   rivaroxaban (XARELTO) 10 MG TABS tablet, Take 10 mg by mouth daily., Disp: , Rfl:    Semaglutide,0.25 or 0.5MG/DOS, (OZEMPIC, 0.25 OR 0.5 MG/DOSE,) 2 MG/1.5ML SOPN, INJECT  0.5MG UNDER THE SKIN EVERY WEEK, Disp: 45 mL, Rfl: 3   solifenacin (VESICARE) 5 MG tablet, Take 1 tablet (5 mg total) by mouth daily., Disp: 90 tablet, Rfl: 1   SYNTHROID 50 MCG tablet, TAKE 1 TABLET DAILY, Disp: 90 tablet, Rfl: 3   vitamin B-12 (CYANOCOBALAMIN) 1000 MCG tablet, Take 1,000 mcg by mouth daily., Disp: , Rfl:   Current Facility-Administered Medications:    cyanocobalamin ((VITAMIN B-12)) injection 1,000 mcg, 1,000 mcg, Intramuscular, Once, [name], MD   Allergies  Allergen Reactions   No Known Allergies      Review of Systems  Constitutional:  Negative for chills, fatigue and fever.  HENT:  Negative for congestion, rhinorrhea and sneezing.   Respiratory:  Positive for cough and wheezing. Negative for chest tightness and shortness of breath.   Cardiovascular:  Negative for chest pain and palpitations.  Musculoskeletal:  Negative for arthralgias and myalgias.  Neurological:  Negative for weakness and headaches.    Today's Vitals   There is no height or weight on file to calculate BMI.   Objective:  Physical Exam Constitutional:      Appearance: Normal appearance.  HENT:     Head: Normocephalic and atraumatic.     Nose: No congestion or rhinorrhea.  Cardiovascular:     Rate and Rhythm: Normal rate and regular rhythm.     Pulses: Normal pulses.     Heart sounds: Normal heart sounds. No murmur heard. Pulmonary:     Effort: Pulmonary effort is normal. No respiratory distress.     Breath sounds: Wheezing present. No rhonchi.  Skin:    General: Skin is warm and dry.     Capillary Refill: Capillary refill takes less than 2 seconds.  Neurological:     Mental Status: He is alert and oriented to person, place, and time.        Assessment And Plan:     1. Cough - POC COVID-19- neg  - Novel Coronavirus, NAA ([facility]) - amoxicillin-clavulanate (AUGMENTIN) 875-125 MG tablet; Take 1 tablet by mouth 2 (two) times daily for 7 days.  Dispense: 14 tablet; Refill:  0 - benzonatate (TESSALON PERLES) 100 MG capsule; Take 1 capsule (100 mg total) by mouth 3 (three) times daily as needed for cough.  Dispense: 30 capsule; Refill: 0 - predniSONE (DELTASONE) 10 MG tablet; Take 1 tablet (10 mg total) by mouth daily with breakfast.  Dispense: 6 tablet; Refill: 0  2. Bronchitis - guaiFENesin-dextromethorphan (ROBITUSSIN DM) 100-10 MG/5ML syrup; Take 5 mLs by mouth every 4 (four) hours as needed for cough.  Dispense: 118 mL; Refill: 0 -benzonatate (TESSALON PERLES) 100 MG capsule; Take 1 capsule (100 mg total) by mouth 3 (three) times daily as needed for cough.  Dispense: 30 capsule; Refill: 0  3. Wheezing  -Pt advised to continue to use his inhaler and nebulizer as needed.  -will consider chest xray.  -predniSONE (DELTASONE) 10 MG tablet; Take 1 tablet (10 mg total) by mouth daily with breakfast.  Dispense: 6 tablet; Refill: 0  Follow up: if symptoms persist or do not get better.   Side effects and appropriate use of all the medication(s) were discussed with the patient today. Patient advised to use the medication(s) as directed by their healthcare provider. The patient was encouraged to read, review, and understand all associated package inserts and contact our office with any questions or concerns. The patient accepts the risks of the treatment plan and had an opportunity to ask questions.   Follow up: if symptoms persist or do not get better.   Patient was given opportunity to ask questions. Patient verbalized understanding of the plan and was able to repeat key elements of the plan. All questions were answered to their satisfaction.  Raman Jw Covin, DNP   I, Raman Amanii Snethen have reviewed all documentation for this visit. The documentation on 10/03/20 for the exam, diagnosis, procedures, and orders are all accurate and complete.    IF YOU HAVE BEEN REFERRED TO A SPECIALIST, IT MAY TAKE 1-2 WEEKS  TO SCHEDULE/PROCESS THE REFERRAL. IF YOU HAVE NOT HEARD FROM  US/SPECIALIST IN TWO WEEKS, PLEASE GIVE [place] A CALL AT [phone].   THE PATIENT IS ENCOURAGED TO PRACTICE SOCIAL DISTANCING DUE TO THE COVID-19 PANDEMIC.

## 2020-10-04 ENCOUNTER — Other Ambulatory Visit: Payer: Self-pay | Admitting: Nurse Practitioner

## 2020-10-04 DIAGNOSIS — R059 Cough, unspecified: Secondary | ICD-10-CM

## 2020-10-04 LAB — SARS-COV-2, NAA 2 DAY TAT

## 2020-10-04 LAB — NOVEL CORONAVIRUS, NAA: SARS-CoV-2, NAA: NOT DETECTED

## 2020-10-19 ENCOUNTER — Other Ambulatory Visit: Payer: Self-pay

## 2020-10-19 ENCOUNTER — Encounter: Payer: Self-pay | Admitting: Neurology

## 2020-10-19 ENCOUNTER — Ambulatory Visit (INDEPENDENT_AMBULATORY_CARE_PROVIDER_SITE_OTHER): Payer: Medicare Other | Admitting: Neurology

## 2020-10-19 VITALS — BP 130/76 | HR 61 | Ht 70.0 in | Wt 242.0 lb

## 2020-10-19 DIAGNOSIS — Z8669 Personal history of other diseases of the nervous system and sense organs: Secondary | ICD-10-CM | POA: Diagnosis not present

## 2020-10-19 DIAGNOSIS — G4719 Other hypersomnia: Secondary | ICD-10-CM

## 2020-10-19 DIAGNOSIS — M7989 Other specified soft tissue disorders: Secondary | ICD-10-CM

## 2020-10-19 DIAGNOSIS — G2581 Restless legs syndrome: Secondary | ICD-10-CM | POA: Diagnosis not present

## 2020-10-19 NOTE — Patient Instructions (Addendum)
It was nice to meet you today and provide a second opinion for your restless leg syndrome.  I recommend that you follow-up with your neurologist, Dr. Sabra Heck about your restless legs with the possibility of trying a different type of gabapentin which is long-acting.  There are a few different options for switching the gabapentin.  Since you have possibly developed side effects with the Mirapex I would recommend that you talk to him about perhaps tapering the Mirapex.  You may be able to try a medication in patch form, called Neupro.  Please also talk to Dr. Baird Cancer who is prescribing your Cymbalta about possibly coming off of the Cymbalta since you denied any depression.  Cymbalta and other antidepressant medications in the SSRI family for example or in the tricyclic antidepressant group can cause restless leg symptoms to be worse or leg twitching at night.  You are endorsing quite a bit of daytime sleepiness.  You may not be safe to drive.  Please do not drive when sleepy.  Your sleepiness may be in part due to medication effect as you are taking several potentially sedating medications and in part due to untreated sleep apnea if you have obstructive sleep apnea and are not on a CPAP or similar machine currently.   Please follow-up with your sleep specialist who manages your sleep apnea.  If you still have obstructive sleep apnea, treating your sleep apnea can improve your restless leg symptoms and sleep consolidation at night.  As discussed, Mirapex can cause side effects such as swelling and also increases the risk for abnormal behaviors such as gambling.  Since you have noticed problems in this regard, please talk to your prescriber about coming off of the Mirapex.  Please also follow-up with your primary care regarding your significant leg swelling.  We will not make a follow-up appointment in this clinic, and I recommend you follow-up with your neurologist as scheduled.

## 2020-10-19 NOTE — Patient Instructions (Signed)
Visit Information It was great speaking with you today!  Please let me know if you have any questions about our visit.   Goals Addressed             This Visit's Progress    Manage My Medicine       Timeframe:  Long-Range Goal Priority:  High Start Date:                             Expected End Date:                       Follow Up Date 03/20/2021   - call for medicine refill 2 or 3 days before it runs out - call if I am sick and can't take my medicine - keep a list of all the medicines I take; vitamins and herbals too - use a pillbox to sort medicine - use an alarm clock or phone to remind me to take my medicine    Why is this important?   These steps will help you keep on track with your medicines.          Patient Care Plan: CCM Pharmacy Care Plan     Problem Identified: HTN, HLD, RESTLESS LEG SYNDROME, DM II      Long-Range Goal: Disease Management   Start Date: 08/24/2020  Recent Progress: On track  Priority: High  Note:    Current Barriers:  Does not adhere to prescribed medication regimen  Pharmacist Clinical Goal(s):  Patient will achieve adherence to monitoring guidelines and medication adherence to achieve therapeutic efficacy through collaboration with PharmD and provider.   Interventions: 1:1 collaboration with [name], MD regarding development and update of comprehensive plan of care as evidenced by provider attestation and co-signature Inter-disciplinary care team collaboration (see longitudinal plan of care) Comprehensive medication review performed; medication list updated in electronic medical record  Hypertension (BP goal <130/80) -Controlled -Current treatment: Carvedilol 6.25 mg tablet twice per day with a meal Losartan 50 mg tablet two times daily -Denies hypotensive/hypertensive symptoms -Educated on BP goals and benefits of medications for prevention of heart attack, stroke and kidney damage; Daily salt intake goal < 2300  mg; Exercise goal of 150 minutes per week; Importance of home blood pressure monitoring; -Counseled to monitor BP at home at ;[place] three times per week, document, and provide log at future appointments -Recommended to continue current medication  Diabetes (A1c goal <7%) -Controlled -Current medications: Jardiance 10 mg taking 1 tablet by mouth daily in the morning   -Current home glucose readings fasting glucose: 100-180  -Denies hypoglycemic/hyperglycemic symptoms -Current meal patterns: 3 meals a day   breakfast: eating biscuits and gravy twice a week - instead of eating three biscuits and a bowl of gravy he has cut it down to one biscuit and covering his gravy, egg and toast, or cereal cheerios: whole milk lunch: he is eating smaller portions of food  dinner: eating a lot vegetables - cabbage (boiled, steamed), carrots, salads, rarely potatoes with steak or fish, do not eat a lot of fast food  snacks: not eating a lot of snacks drinks: whole milk - 1/2 gallon, going to increase water -Current exercise: walking a lot around the pool table. He is getting ready to start an exercise regimen where you will be walking and using the weight equipment.  -He is working on making a space for him to work out.  -  Patient reports missing his medication sometimes in the morning, when he does not eat breakfast  -Patient has an app that counts his steps when walks  -Educated on Exercise goal of 150 minutes per week; Benefits of weight loss; Carbohydrate counting and/or plate method  -Recommended that patient look into [facility]  -Counseled to check feet daily and get yearly eye exams -Recommended to continue current medication  Hyperlipidemia: (LDL goal < 70) -Uncontrolled -Current treatment: Atorvastatin 20 mg tablet once per day  Xarelto 10 mg tablet once per day Amiodarone 100 mg tablet once per day -Current exercise habits: Patient reports that he is not exercising  frequently but he is interested in doing more.  -Educated on Cholesterol goals;  Benefits of statin for ASCVD risk reduction; Importance of limiting foods high in cholesterol; Exercise goal of 150 minutes per week; -Recommended to continue current medication -Collaborate with PCP team to increase Atorvastatin dosage, follow up with patient in 6 weeks to assess lipid panel and chemistry panel and CBC.   Restless Leg Syndrome  -Controlled -Current treatment  Mirapex 0.5 mg taking 1 tablet in the morning and 1 tablet at bedtime Gabapentin 600 mg tablet twice daily as needed for restless leg syndrome.  -Patient reports the ticklish feeling at the bottom of the feet increases, and then his legs starts to become irritation  -Patient reports that he is not sure if the medication is working  -Counseled on diet and exercise extensively Recommended patient collaborate with doctor to determine if changes to his medication need to be changed.   Health Maintenance -Vaccine gaps:   -COVID 19 Second Booster- scheduled                        -Scheduled booster shot for patient and his wife.  -Collaborated with patient to schedule COVID-19 second booster vaccine.   Patient Goals/Self-Care Activities Patient will:  - take medications as prescribed  Follow Up Plan: Telephone follow up appointment with care management team member scheduled for:  03/20/2021 The patient has been provided with contact information for the care management team and has been advised to call with any health related questions or concerns.       Patient agreed to services and verbal consent obtained.   The patient verbalized understanding of instructions, educational materials, and care plan provided today and agreed to receive a mailed copy of patient instructions, educational materials, and care plan.   Orlando Penner, PharmD Clinical Pharmacist [facility] [phone]

## 2020-10-19 NOTE — Progress Notes (Signed)
Subjective:    Patient ID: Franklin Thornton is a 75 y.o. male.  HPI    Star Age, MD, PhD [facility] [address], [address] [place], [place] [address]  Dear Doreene Burke,   I saw your patient, Franklin Thornton, upon your kind request in my sleep clinic today for second opinion of his restless leg syndrome.  The patient is unaccompanied today.  As you know, Franklin Thornton is a 75 year old right-handed gentleman with an underlying complex medical history of atrial fibrillation, coronary artery disease with status post stenting, peripheral vascular disease, prostate cancer, diabetes, hypertension, hyperlipidemia, memory loss, hypothyroidism, and obesity, who reports a longstanding history of restless leg symptoms.  Symptoms have been ongoing for years, sometimes happen in the daytime and sometimes at night, sometimes when he is sitting and trying to rest.  Symptoms include crawling sensation which starts at the bottom of his feet and traverses upwards.  It helps to move his legs.  He has been followed by Dr. Sabra Heck in neurology and most recently saw him in February 2022.  I reviewed the office note.  I also reviewed your office note from 07/31/2020.  He has tried and failed several medications.  He has been on ropinirole and Lyrica before.  Of note, he is currently on multiple medications, including Mirapex 0.5 mg strength 2 pills twice daily for total of 2 mg.  He is also on Cymbalta 30 mg once daily and is on other potentially sedating medications, including hydrocodone.  He is on hydrocodone for chronic back pain, this is prescribed by the [place].  He endorses significant daytime somnolence, Epworth sleepiness score is 19 out of 24.  He is advised strongly not to drive when feeling sleepy.  He is also on gabapentin 600 mg twice daily. Prior evaluation for iron deficiency was negative.  He was previously diagnosed with obstructive sleep apnea but reports that he could not tolerate  CPAP.  He stopped using his CPAP.  He reports that about a year ago he had another sleep study which did not show any obstructive sleep apnea but he had significant oxygen desaturation.  I am not quite sure why his oxygen dropped, he does not have a history of lung disease.  He reports that he has been treated with oxygen since then.  He has significant nocturia about 4-5 times per average night, bedtime is around midnight and rise time around 6 or 7 AM.  Prior sleep study results are not available for my review today.  From what I can see in the chart, he is followed by Dr. Alcide Clever for his sleep apnea. I reviewed recent labs that were available in his chart, on 07/31/2020 TSH was normal at 1.24.  A1c at the time was 6.9.  Lipid panel showed total cholesterol of 181, triglycerides 298, HDL 47, LDL 85.  On 08/22/2020 vitamin B12 was 795, CBC without differential was unremarkable, on 09/20/2020 his A1c was 6.8.  He recently reported to you a problem with gambling.  His wife was concerned that this could be related to his Mirapex.  He has chronic lower extremity swelling. He drinks alcohol occasionally, maybe once a week, he drinks caffeine in the form of coffee, 1 cup in the morning typically.  He tries to hydrate well with water.  His Past Medical History Is Significant For: Past Medical History:  Diagnosis Date   Atrial fibrillation (Franklin Thornton)    Diabetes mellitus without complication (Franklin Thornton)    Peripheral  vascular disease (Franklin Thornton)    Prostate cancer (Franklin Thornton)    Uvular edema 07/17/2015    His Past Surgical History Is Significant For: Past Surgical History:  Procedure Laterality Date   CORONARY ANGIOPLASTY WITH STENT PLACEMENT     x2   ENDOVENOUS ABLATION SAPHENOUS VEIN W/ LASER Left 08/19/2017   endovenous laser ablation left greater saphenous vein by Tinnie Gens MD    EXTERNAL EAR SURGERY  2020   right ear    UVULECTOMY  07/31/15    His Family History Is Significant For: Family History  Problem Relation  Age of Onset   Lung cancer Mother    Diabetes Mother    Esophageal cancer Father    Hypertension Sister    Breast cancer Sister    Healthy Son    Healthy Daughter     His Social History Is Significant For: Social History   Socioeconomic History   Marital status: Married    Spouse name: Not on file   Number of children: Not on file   Years of education: Not on file   Highest education level: Not on file  Occupational History   Occupation: retired  Tobacco Use   Smoking status: Former    Packs/day: 2.50    Years: 12.00    Pack years: 30.00    Types: Cigarettes    Start date: 04/23/1959    Quit date: 08/14/1971    Years since quitting: 49.2   Smokeless tobacco: Never   Tobacco comments:    he has quit  Vaping Use   Vaping Use: Never used  Substance and Sexual Activity   Alcohol use: Yes    Alcohol/week: 1.0 standard drink    Types: 1 Standard drinks or equivalent per week    Comment: once a month   Drug use: Yes    Types: Hydrocodone   Sexual activity: Not Currently  Other Topics Concern   Not on file  Social History Narrative   Not on file   Social Determinants of Health   Financial Resource Strain: Low Risk    Difficulty of Paying Living Expenses: Not hard at all  Food Insecurity: No Food Insecurity   Worried About Charity fundraiser in the Last Year: Never true   [place] in the Last Year: Never true  Transportation Needs: No Transportation Needs   Lack of Transportation (Medical): No   Lack of Transportation (Non-Medical): No  Physical Activity: Inactive   Days of Exercise per Week: 0 days   Minutes of Exercise per Session: 0 min  Stress: No Stress Concern Present   Feeling of Stress : Not at all  Social Connections: Not on file    His Allergies Are:  Allergies  Allergen Reactions   No Known Allergies   :   His Current Medications Are:  Outpatient Encounter Medications as of 10/19/2020  Medication Sig   albuterol (PROVENTIL HFA;VENTOLIN  HFA) 108 (90 Base) MCG/ACT inhaler Inhale 2 puffs into the lungs every 6 (six) hours as needed for wheezing or shortness of breath.   albuterol (PROVENTIL) (2.5 MG/3ML) 0.083% nebulizer solution Take 3 mLs (2.5 mg total) by nebulization every 6 (six) hours as needed for wheezing or shortness of breath.   amiodarone (PACERONE) 100 MG tablet Take 100 mg by mouth daily.    aspirin 81 MG tablet Take 81 mg by mouth daily.   atorvastatin (LIPITOR) 20 MG tablet Take 1 tablet (20 mg total) by mouth daily.   b  complex vitamins capsule Take 1 capsule by mouth daily.   benzonatate (TESSALON PERLES) 100 MG capsule Take 1 capsule (100 mg total) by mouth 3 (three) times daily as needed for cough.   budesonide-formoterol (SYMBICORT) 160-4.5 MCG/ACT inhaler Inhale 2 puffs into the lungs 2 (two) times daily.    carvedilol (COREG) 6.25 MG tablet Take 6.25 mg by mouth 2 (two) times daily with a meal.   Cholecalciferol (VITAMIN D) 2000 units tablet Take 2,000 Units by mouth daily.   diphenhydrAMINE (BENADRYL) 25 MG tablet Take 25 mg by mouth every 6 (six) hours as needed for itching.    donepezil (ARICEPT) 10 MG tablet TAKE 1 TABLET AT BEDTIME   DULoxetine (CYMBALTA) 30 MG capsule TAKE 1 CAPSULE DAILY IN THE EVENING   furosemide (LASIX) 20 MG tablet Take 20 mg by mouth daily as needed.    gabapentin (NEURONTIN) 600 MG tablet Take 600 mg by mouth 2 (two) times daily.   glucose monitoring kit (FREESTYLE) monitoring kit 1 each by Does not apply route as needed for other.   guaiFENesin-dextromethorphan (ROBITUSSIN DM) 100-10 MG/5ML syrup Take 5 mLs by mouth every 4 (four) hours as needed for cough.   HYDROcodone-acetaminophen (NORCO/VICODIN) 5-325 MG tablet Take 1 tablet by mouth every 6 (six) hours as needed for moderate pain.   HYDROcodone-homatropine (HYDROMET) 5-1.5 MG/5ML syrup Take 5 mLs by mouth every 6 (six) hours as needed. (Patient not taking: Reported on 08/24/2020)   ipratropium (ATROVENT) 0.03 % nasal spray  Place 2 sprays into both nostrils every 12 (twelve) hours.   JARDIANCE 10 MG TABS tablet TAKE 1 TABLET DAILY   levocetirizine (XYZAL) 5 MG tablet Take 5 mg by mouth every evening.    lidocaine (LIDODERM) 5 % Place 3 patches onto the skin daily. May use 1-3 patches at one time, Remove & Discard patch within 12 hours or as directed by MD   losartan (COZAAR) 50 MG tablet Take 50 mg by mouth 2 (two) times daily.    Magnesium 250 MG TABS Take 1 tablet by mouth daily.   methocarbamol (ROBAXIN) 750 MG tablet Take 750 mg by mouth 3 (three) times daily as needed for muscle spasms.    mirabegron ER (MYRBETRIQ) 50 MG TB24 tablet Take 50 mg by mouth daily.   montelukast (SINGULAIR) 10 MG tablet Take 10 mg by mouth at bedtime.   Multiple Vitamin (MULTIVITAMIN) tablet Take 1 tablet by mouth daily.   nitroGLYCERIN (NITROSTAT) 0.4 MG SL tablet Place 0.4 mg under the tongue every 5 (five) minutes as needed for chest pain.    Omega-3 Fatty Acids (FISH OIL) 1200 MG CAPS Take 1,200 mg by mouth.    pantoprazole (PROTONIX) 40 MG tablet Take 1 tablet (40 mg total) by mouth daily.   pramipexole (MIRAPEX) 0.5 MG tablet Take 2 tablets (1 mg total) by mouth in the morning and at bedtime.   predniSONE (DELTASONE) 10 MG tablet Take 1 tablet (10 mg total) by mouth daily with breakfast.   rivaroxaban (XARELTO) 10 MG TABS tablet Take 10 mg by mouth daily.   Semaglutide,0.25 or 0.5MG /DOS, (OZEMPIC, 0.25 OR 0.5 MG/DOSE,) 2 MG/1.5ML SOPN INJECT 0.5MG  UNDER THE SKIN EVERY WEEK   solifenacin (VESICARE) 5 MG tablet Take 1 tablet (5 mg total) by mouth daily.   SYNTHROID 50 MCG tablet TAKE 1 TABLET DAILY   vitamin B-12 (CYANOCOBALAMIN) 1000 MCG tablet Take 1,000 mcg by mouth daily.   Facility-Administered Encounter Medications as of 10/19/2020  Medication   cyanocobalamin ((VITAMIN  B-12)) injection 1,000 mcg  :   Review of Systems:  Out of a complete 14 point review of systems, all are reviewed and negative with the exception of  these symptoms as listed below:  Review of Systems  Neurological:        Here for sleep consult. Prior sleep study and OSA found tried CPAP could not tolerate. Reports RLS and sleep disturbance at night.  Pt reports RLS sx keeps him up at night.   Objective:  Neurological Exam  Physical Exam Physical Examination:   Vitals:   10/19/20 0830  BP: 130/76  Pulse: 61    General Examination: The patient is a very pleasant 75 y.o. male in no acute distress. He appears well-developed and well-nourished and well groomed.   HEENT: Normocephalic, atraumatic, pupils are equal, round and reactive to light, hearing is grossly intact, right-sided hearing aid noted.  Speech is clear without dysarthria, face is symmetric with normal facial animation.  Neck is supple, full range of motion noted.    Chest: Clear to auscultation without wheezing, rhonchi or crackles noted.  Heart: S1+S2+0, regular and normal without murmurs, rubs or gallops noted.   Abdomen: Soft, non-tender, protuberant.   Extremities: There is 2+ pitting edema in the distal lower extremities bilaterally, chronic appearing discoloration in the lower extremities bilaterally, from the mid shin area down.  Skin: Warm and dry without trophic changes noted.   Musculoskeletal: exam reveals no obvious joint deformities.   Neurologically:  Mental status: The patient is awake, alert and oriented in all 4 spheres. His immediate and remote memory, attention, language skills and fund of knowledge are appropriate. There is no evidence of aphasia, agnosia, apraxia or anomia. Speech is clear with normal prosody and enunciation. Thought process is linear. Mood is normal and affect is normal.  Cranial nerves II - XII are as described above under HEENT exam.  Motor exam: Normal bulk, strength and tone is noted. There is no tremor, Romberg is negative. Reflexes are 1+ in the upper extremities and absent in the lower extremities, toes are downgoing  bilaterally.  Cerebellar testing: No dysmetria or intention tremor when reaching out with the hands. There is no truncal or gait ataxia.  Sensory exam: intact to light touch in the upper and lower extremities.  Gait, station and balance: He stands without difficulty, posture is age-appropriate.  He walks slightly slowly, no shuffling noted, has preserved arm swing bilaterally.  Assessment and plan:   In summary, Franklin Thornton is a very pleasant 75 y.o.-year old male with an underlying complex medical history of atrial fibrillation, coronary artery disease with status post stenting, peripheral vascular disease, prostate cancer, diabetes, hypertension, hyperlipidemia, memory loss, hypothyroidism, and obesity, who presents for evaluation of his restless leg syndrome of several years duration.  He has tried several medications, currently on relatively high dose of Mirapex and gabapentin.  His lower extremity edema may be in part related to taking Mirapex.  He is advised to talk to his primary neurologist, Dr. Sabra Heck about additional medication options such as switching to long-acting gabapentin formulation and switching from Mirapex to Neupro patch.  He is also advised that Mirapex has been associated with impulse control disorders including gambling, overeating, OCD type changes.  He is advised to talk to about his swelling.  He is furthermore advised to follow-up with his sleep specialist, as treating obstructive sleep apnea can help reduce restless leg symptoms.  If he has residual obstructive sleep apnea, he may benefit  from treatment.  He is also advised that certain medications can exacerbate restless leg symptoms including antidepressant medications.  He is currently on Cymbalta and is advised to talk to you about potentially changing or weaning off this medication.  He denies a history of depression and is not sure why he is on the Cymbalta.  He is advised to follow-up with his current specialists and  with your office as scheduled.  I answered all his questions today and he was in agreement. Thank you very much for allowing me to weigh in.  Sincerely,   Star Age, MD, PhD

## 2020-11-07 ENCOUNTER — Other Ambulatory Visit: Payer: Self-pay

## 2020-11-07 ENCOUNTER — Ambulatory Visit (INDEPENDENT_AMBULATORY_CARE_PROVIDER_SITE_OTHER): Payer: Medicare Other | Admitting: Nurse Practitioner

## 2020-11-07 ENCOUNTER — Ambulatory Visit
Admission: RE | Admit: 2020-11-07 | Discharge: 2020-11-07 | Disposition: A | Discharge status: Home / Self Care | Payer: Medicare Other | Source: Ambulatory Visit | Attending: Nurse Practitioner | Admitting: Nurse Practitioner

## 2020-11-07 VITALS — BP 112/70 | HR 70

## 2020-11-07 DIAGNOSIS — R059 Cough, unspecified: Secondary | ICD-10-CM

## 2020-11-07 DIAGNOSIS — R0609 Other forms of dyspnea: Secondary | ICD-10-CM | POA: Diagnosis not present

## 2020-11-07 DIAGNOSIS — R0600 Dyspnea, unspecified: Secondary | ICD-10-CM | POA: Diagnosis not present

## 2020-11-07 MED ORDER — BUDESONIDE-FORMOTEROL FUMARATE 160-4.5 MCG/ACT IN AERO: 2.0000 | INHALATION_SPRAY | Freq: Two times a day (BID) | RESPIRATORY_TRACT | 3 refills | Status: DC

## 2020-11-07 NOTE — Progress Notes (Addendum)
Patient was seen outside of the office due to Covid exposure and symptoms of cough  I,Franklin Thornton,acting as a Education administrator for [employer], FNP.,have documented all relevant documentation on the behalf of Franklin Brine, FNP,as directed by  Franklin Brine, FNP while in the presence of Franklin Thornton, Franklin Thornton.  This visit occurred during the SARS-CoV-2 public health emergency.  Safety protocols were in place, including screening questions prior to the visit, additional usage of staff PPE, and extensive cleaning of exam room while observing appropriate contact time as indicated for disinfecting solutions.    Subjective:     Patient ID: Franklin Thornton , male    DOB: March 08, 1946 , 75 y.o.   MRN: [id number]   Chief Complaint  Patient presents with   Cough    HPI  Patient is here for cough. He states that he has had this cough for over a month. He recently took a trip to [place] with his family and his wife, daughter and grandchildren tested positive for covid when they returned.  Came back on Friday, his family tested.   Cough This is a chronic problem. The cough is Non-productive. Associated symptoms include shortness of breath (he feels like he has had to force himself to breath) and wheezing. Pertinent negatives include no chest pain, chills, fever, headaches or sore throat. He has tried oral steroids for the symptoms. His past medical history is significant for asthma and bronchitis.    Past Medical History:  Diagnosis Date   Atrial fibrillation ([place])    Diabetes mellitus without complication ([place])    Peripheral vascular disease ([place])    Prostate cancer ([place])    Uvular edema 07/17/2015     Family History  Problem Relation Age of Onset   Lung cancer Mother    Diabetes Mother    Esophageal cancer Father    Hypertension Sister    Breast cancer Sister    Healthy Son    Healthy Daughter      Current Outpatient Medications:    albuterol (PROVENTIL HFA;VENTOLIN HFA) 108 (90 Base) MCG/ACT  inhaler, Inhale 2 puffs into the lungs every 6 (six) hours as needed for wheezing or shortness of breath., Disp: , Rfl:    albuterol (PROVENTIL) (2.5 MG/3ML) 0.083% nebulizer solution, Take 3 mLs (2.5 mg total) by nebulization every 6 (six) hours as needed for wheezing or shortness of breath., Disp: 75 mL, Rfl: 3   amiodarone (PACERONE) 100 MG tablet, Take 100 mg by mouth daily. , Disp: , Rfl:    aspirin 81 MG tablet, Take 81 mg by mouth daily., Disp: , Rfl:    atorvastatin (LIPITOR) 20 MG tablet, Take 1 tablet (20 mg total) by mouth daily., Disp: 90 tablet, Rfl: 1   b complex vitamins capsule, Take 1 capsule by mouth daily., Disp: , Rfl:    benzonatate (TESSALON PERLES) 100 MG capsule, Take 1 capsule (100 mg total) by mouth 3 (three) times daily as needed for cough., Disp: 30 capsule, Rfl: 0   budesonide-formoterol (SYMBICORT) 160-4.5 MCG/ACT inhaler, Inhale 2 puffs into the lungs 2 (two) times daily., Disp: 1 each, Rfl: 3   carvedilol (COREG) 6.25 MG tablet, Take 6.25 mg by mouth 2 (two) times daily with a meal., Disp: , Rfl:    Cholecalciferol (VITAMIN D) 2000 units tablet, Take 2,000 Units by mouth daily., Disp: , Rfl:    diphenhydrAMINE (BENADRYL) 25 MG tablet, Take 25 mg by mouth every 6 (six) hours as needed for itching. , Disp: , Rfl:  donepezil (ARICEPT) 10 MG tablet, TAKE 1 TABLET AT BEDTIME, Disp: 90 tablet, Rfl: 3   DULoxetine (CYMBALTA) 30 MG capsule, TAKE 1 CAPSULE DAILY IN THE EVENING, Disp: 90 capsule, Rfl: 3   furosemide (LASIX) 20 MG tablet, Take 20 mg by mouth daily as needed. , Disp: , Rfl:    gabapentin (NEURONTIN) 600 MG tablet, Take 600 mg by mouth 2 (two) times daily., Disp: , Rfl:    glucose monitoring kit (FREESTYLE) monitoring kit, 1 each by Does not apply route as needed for other., Disp: , Rfl:    guaiFENesin-dextromethorphan (ROBITUSSIN DM) 100-10 MG/5ML syrup, Take 5 mLs by mouth every 4 (four) hours as needed for cough., Disp: 118 mL, Rfl: 0    HYDROcodone-acetaminophen (NORCO/VICODIN) 5-325 MG tablet, Take 1 tablet by mouth every 6 (six) hours as needed for moderate pain., Disp: , Rfl:    HYDROcodone-homatropine (HYDROMET) 5-1.5 MG/5ML syrup, Take 5 mLs by mouth every 6 (six) hours as needed. (Patient not taking: Reported on 08/24/2020), Disp: 120 mL, Rfl: 0   ipratropium (ATROVENT) 0.03 % nasal spray, Place 2 sprays into both nostrils every 12 (twelve) hours., Disp: , Rfl:    JARDIANCE 10 MG TABS tablet, TAKE 1 TABLET DAILY, Disp: 90 tablet, Rfl: 3   levocetirizine (XYZAL) 5 MG tablet, Take 5 mg by mouth every evening. , Disp: , Rfl:    lidocaine (LIDODERM) 5 %, Place 3 patches onto the skin daily. May use 1-3 patches at one time, Remove & Discard patch within 12 hours or as directed by MD, Disp: 90 patch, Rfl: 0   losartan (COZAAR) 50 MG tablet, Take 50 mg by mouth 2 (two) times daily. , Disp: , Rfl:    Magnesium 250 MG TABS, Take 1 tablet by mouth daily., Disp: , Rfl:    methocarbamol (ROBAXIN) 750 MG tablet, Take 750 mg by mouth 3 (three) times daily as needed for muscle spasms. , Disp: , Rfl:    mirabegron ER (MYRBETRIQ) 50 MG TB24 tablet, Take 50 mg by mouth daily., Disp: , Rfl:    montelukast (SINGULAIR) 10 MG tablet, Take 10 mg by mouth at bedtime., Disp: , Rfl:    Multiple Vitamin (MULTIVITAMIN) tablet, Take 1 tablet by mouth daily., Disp: , Rfl:    nitroGLYCERIN (NITROSTAT) 0.4 MG SL tablet, Place 0.4 mg under the tongue every 5 (five) minutes as needed for chest pain. , Disp: , Rfl:    Omega-3 Fatty Acids (FISH OIL) 1200 MG CAPS, Take 1,200 mg by mouth. , Disp: , Rfl:    pantoprazole (PROTONIX) 40 MG tablet, Take 1 tablet (40 mg total) by mouth daily., Disp: 90 tablet, Rfl: 1   pramipexole (MIRAPEX) 0.5 MG tablet, Take 2 tablets (1 mg total) by mouth in the morning and at bedtime., Disp: 90 tablet, Rfl: 1   rivaroxaban (XARELTO) 10 MG TABS tablet, Take 10 mg by mouth daily., Disp: , Rfl:    Semaglutide,0.25 or 0.5MG /DOS,  (OZEMPIC, 0.25 OR 0.5 MG/DOSE,) 2 MG/1.5ML SOPN, INJECT 0.5MG  UNDER THE SKIN EVERY WEEK, Disp: 45 mL, Rfl: 3   solifenacin (VESICARE) 5 MG tablet, Take 1 tablet (5 mg total) by mouth daily., Disp: 90 tablet, Rfl: 1   SYNTHROID 50 MCG tablet, TAKE 1 TABLET DAILY, Disp: 90 tablet, Rfl: 3   vitamin B-12 (CYANOCOBALAMIN) 1000 MCG tablet, Take 1,000 mcg by mouth daily., Disp: , Rfl:   Current Facility-Administered Medications:    cyanocobalamin ((VITAMIN B-12)) injection 1,000 mcg, 1,000 mcg, Intramuscular, Once, [employer],  MD   Allergies  Allergen Reactions   No Known Allergies      Review of Systems  Constitutional:  Negative for chills, fatigue and fever.  HENT:  Negative for sore throat.   Respiratory:  Positive for cough, shortness of breath (he feels like he has had to force himself to breath) and wheezing.   Cardiovascular:  Negative for chest pain, palpitations and leg swelling.  Gastrointestinal:  Positive for diarrhea (last night).  Neurological:  Negative for dizziness and headaches.  Psychiatric/Behavioral: Negative.      Today's Vitals   11/07/20 0931  BP: 112/70  Pulse: 70   There is no height or weight on file to calculate BMI.   Objective:  Physical Exam Vitals reviewed.  Constitutional:      General: He is not in acute distress.    Appearance: Normal appearance. He is obese.  Cardiovascular:     Rate and Rhythm: Normal rate and regular rhythm.     Pulses: Normal pulses.     Heart sounds: Normal heart sounds. No murmur heard. Pulmonary:     Effort: Pulmonary effort is normal. No respiratory distress.     Breath sounds: Wheezing present. No rales.  Chest:     Chest wall: No tenderness.  Neurological:     General: No focal deficit present.     Mental Status: He is alert and oriented to person, place, and time.     Cranial Nerves: No cranial nerve deficit.  Psychiatric:        Mood and Affect: Mood normal.        Behavior: Behavior normal.         Thought Content: Thought content normal.        Judgment: Judgment normal.        Assessment And Plan:     1. Cough Will check CXR and covid test pending results may need to treat with antiviral Use albuterol nebulizer 2 times a day for the next 3 days. - Novel Coronavirus, NAA ([facility]) - DG Chest 2 View; Future - budesonide-formoterol (SYMBICORT) 160-4.5 MCG/ACT inhaler; Inhale 2 puffs into the lungs 2 (two) times daily.  Dispense: 1 each; Refill: 3       Patient was given opportunity to ask questions. Patient verbalized understanding of the plan and was able to repeat key elements of the plan. All questions were answered to their satisfaction.  Franklin Brine, FNP   I, Franklin Brine, FNP, have reviewed all documentation for this visit. The documentation on 11/07/20 for the exam, diagnosis, procedures, and orders are all accurate and complete.   IF YOU HAVE BEEN REFERRED TO A SPECIALIST, IT MAY TAKE 1-2 WEEKS TO SCHEDULE/PROCESS THE REFERRAL. IF YOU HAVE NOT HEARD FROM US/SPECIALIST IN TWO WEEKS, PLEASE GIVE [place] A CALL AT [phone].   THE PATIENT IS ENCOURAGED TO PRACTICE SOCIAL DISTANCING DUE TO THE COVID-19 PANDEMIC.

## 2020-11-07 NOTE — Patient Instructions (Signed)

## 2020-11-08 ENCOUNTER — Other Ambulatory Visit: Payer: Self-pay | Admitting: Nurse Practitioner

## 2020-11-08 ENCOUNTER — Ambulatory Visit: Payer: Medicare Other | Admitting: Internal Medicine

## 2020-11-08 DIAGNOSIS — U071 COVID-19: Secondary | ICD-10-CM

## 2020-11-08 LAB — SARS-COV-2, NAA 2 DAY TAT

## 2020-11-08 LAB — NOVEL CORONAVIRUS, NAA: SARS-CoV-2, NAA: DETECTED — AB

## 2020-11-08 MED ORDER — ADVAIR HFA 230-21 MCG/ACT IN AERO: 2.0000 | INHALATION_SPRAY | Freq: Two times a day (BID) | RESPIRATORY_TRACT | 12 refills | Status: DC

## 2020-11-09 ENCOUNTER — Telehealth: Payer: Self-pay

## 2020-11-09 NOTE — Telephone Encounter (Signed)
The pt let ft a message that he is not having anymore problems ore symptoms and that he doesn't think he needs the infusion.

## 2020-11-21 ENCOUNTER — Other Ambulatory Visit: Payer: Self-pay

## 2020-11-21 MED ORDER — WIXELA INHUB 100-50 MCG/ACT IN AEPB: 1.0000 | INHALATION_SPRAY | Freq: Two times a day (BID) | RESPIRATORY_TRACT | 1 refills | Status: DC

## 2020-11-23 ENCOUNTER — Telehealth: Payer: Self-pay

## 2020-11-23 DIAGNOSIS — M5412 Radiculopathy, cervical region: Secondary | ICD-10-CM | POA: Diagnosis not present

## 2020-11-23 DIAGNOSIS — G603 Idiopathic progressive neuropathy: Secondary | ICD-10-CM | POA: Diagnosis not present

## 2020-11-23 DIAGNOSIS — G2581 Restless legs syndrome: Secondary | ICD-10-CM | POA: Diagnosis not present

## 2020-11-23 DIAGNOSIS — J45909 Unspecified asthma, uncomplicated: Secondary | ICD-10-CM | POA: Diagnosis not present

## 2020-11-23 DIAGNOSIS — R634 Abnormal weight loss: Secondary | ICD-10-CM | POA: Diagnosis not present

## 2020-11-23 DIAGNOSIS — M6250 Muscle wasting and atrophy, not elsewhere classified, unspecified site: Secondary | ICD-10-CM | POA: Diagnosis not present

## 2020-11-23 DIAGNOSIS — D649 Anemia, unspecified: Secondary | ICD-10-CM | POA: Diagnosis not present

## 2020-11-23 DIAGNOSIS — J4520 Mild intermittent asthma, uncomplicated: Secondary | ICD-10-CM | POA: Diagnosis not present

## 2020-11-23 DIAGNOSIS — R498 Other voice and resonance disorders: Secondary | ICD-10-CM | POA: Diagnosis not present

## 2020-11-23 DIAGNOSIS — D529 Folate deficiency anemia, unspecified: Secondary | ICD-10-CM | POA: Diagnosis not present

## 2020-11-23 DIAGNOSIS — R059 Cough, unspecified: Secondary | ICD-10-CM | POA: Diagnosis not present

## 2020-11-23 DIAGNOSIS — M5417 Radiculopathy, lumbosacral region: Secondary | ICD-10-CM | POA: Diagnosis not present

## 2020-11-23 DIAGNOSIS — G4736 Sleep related hypoventilation in conditions classified elsewhere: Secondary | ICD-10-CM | POA: Diagnosis not present

## 2020-11-23 DIAGNOSIS — J301 Allergic rhinitis due to pollen: Secondary | ICD-10-CM | POA: Diagnosis not present

## 2020-11-23 DIAGNOSIS — R918 Other nonspecific abnormal finding of lung field: Secondary | ICD-10-CM | POA: Diagnosis not present

## 2020-11-23 DIAGNOSIS — G4719 Other hypersomnia: Secondary | ICD-10-CM | POA: Diagnosis not present

## 2020-11-23 DIAGNOSIS — G5603 Carpal tunnel syndrome, bilateral upper limbs: Secondary | ICD-10-CM | POA: Diagnosis not present

## 2020-11-23 DIAGNOSIS — R202 Paresthesia of skin: Secondary | ICD-10-CM | POA: Diagnosis not present

## 2020-11-23 DIAGNOSIS — Z79899 Other long term (current) drug therapy: Secondary | ICD-10-CM | POA: Diagnosis not present

## 2020-11-23 DIAGNOSIS — G609 Hereditary and idiopathic neuropathy, unspecified: Secondary | ICD-10-CM | POA: Diagnosis not present

## 2020-11-23 NOTE — Chronic Care Management (AMB) (Signed)
Chronic Care Management Pharmacy Assistant   Name: Franklin Thornton  MRN: [id number] DOB: 31-Mar-1946  Reason for Encounter: Disease State/ Diabetes  Recent office visits:  10-03-2020 Bary Castilla, NP. START Augmentin 875-125 mg 2 tablets daily for 7 days. START Tessalon 100 mg 3 times daily as needed. START Robiitussin DM 100-10 mg syrup take 5 mLs every 4 hours as needed for cough. START Prednisone 10 mg daily for 6 days. Negative covid test.  11-07-2020 [name], FNP. FINISHED Prednisone. Positive covid test  Recent consult visits:  10-19-2020 Star Age, MD (Neurology). Recommended following up with primary Neurologist Dr. Sabra Heck.   Hospital visits:  None in previous 6 months  Medications: Outpatient Encounter Medications as of 11/23/2020  Medication Sig   albuterol (PROVENTIL HFA;VENTOLIN HFA) 108 (90 Base) MCG/ACT inhaler Inhale 2 puffs into the lungs every 6 (six) hours as needed for wheezing or shortness of breath.   albuterol (PROVENTIL) (2.5 MG/3ML) 0.083% nebulizer solution Take 3 mLs (2.5 mg total) by nebulization every 6 (six) hours as needed for wheezing or shortness of breath.   amiodarone (PACERONE) 100 MG tablet Take 100 mg by mouth daily.    aspirin 81 MG tablet Take 81 mg by mouth daily.   atorvastatin (LIPITOR) 20 MG tablet Take 1 tablet (20 mg total) by mouth daily.   b complex vitamins capsule Take 1 capsule by mouth daily.   benzonatate (TESSALON PERLES) 100 MG capsule Take 1 capsule (100 mg total) by mouth 3 (three) times daily as needed for cough.   carvedilol (COREG) 6.25 MG tablet Take 6.25 mg by mouth 2 (two) times daily with a meal.   Cholecalciferol (VITAMIN D) 2000 units tablet Take 2,000 Units by mouth daily.   diphenhydrAMINE (BENADRYL) 25 MG tablet Take 25 mg by mouth every 6 (six) hours as needed for itching.    donepezil (ARICEPT) 10 MG tablet TAKE 1 TABLET AT BEDTIME   DULoxetine (CYMBALTA) 30 MG capsule TAKE 1 CAPSULE DAILY IN THE  EVENING   furosemide (LASIX) 20 MG tablet Take 20 mg by mouth daily as needed.    gabapentin (NEURONTIN) 600 MG tablet Take 600 mg by mouth 2 (two) times daily.   glucose monitoring kit (FREESTYLE) monitoring kit 1 each by Does not apply route as needed for other.   guaiFENesin-dextromethorphan (ROBITUSSIN DM) 100-10 MG/5ML syrup Take 5 mLs by mouth every 4 (four) hours as needed for cough.   HYDROcodone-acetaminophen (NORCO/VICODIN) 5-325 MG tablet Take 1 tablet by mouth every 6 (six) hours as needed for moderate pain.   HYDROcodone-homatropine (HYDROMET) 5-1.5 MG/5ML syrup Take 5 mLs by mouth every 6 (six) hours as needed. (Patient not taking: Reported on 08/24/2020)   ipratropium (ATROVENT) 0.03 % nasal spray Place 2 sprays into both nostrils every 12 (twelve) hours.   JARDIANCE 10 MG TABS tablet TAKE 1 TABLET DAILY   levocetirizine (XYZAL) 5 MG tablet Take 5 mg by mouth every evening.    lidocaine (LIDODERM) 5 % Place 3 patches onto the skin daily. May use 1-3 patches at one time, Remove & Discard patch within 12 hours or as directed by MD   losartan (COZAAR) 50 MG tablet Take 50 mg by mouth 2 (two) times daily.    Magnesium 250 MG TABS Take 1 tablet by mouth daily.   methocarbamol (ROBAXIN) 750 MG tablet Take 750 mg by mouth 3 (three) times daily as needed for muscle spasms.    mirabegron ER (MYRBETRIQ) 50 MG TB24 tablet Take  50 mg by mouth daily.   montelukast (SINGULAIR) 10 MG tablet Take 10 mg by mouth at bedtime.   Multiple Vitamin (MULTIVITAMIN) tablet Take 1 tablet by mouth daily.   nitroGLYCERIN (NITROSTAT) 0.4 MG SL tablet Place 0.4 mg under the tongue every 5 (five) minutes as needed for chest pain.    Omega-3 Fatty Acids (FISH OIL) 1200 MG CAPS Take 1,200 mg by mouth.    pantoprazole (PROTONIX) 40 MG tablet Take 1 tablet (40 mg total) by mouth daily.   pramipexole (MIRAPEX) 0.5 MG tablet Take 2 tablets (1 mg total) by mouth in the morning and at bedtime.   rivaroxaban (XARELTO) 10  MG TABS tablet Take 10 mg by mouth daily.   Semaglutide,0.25 or 0.5MG/DOS, (OZEMPIC, 0.25 OR 0.5 MG/DOSE,) 2 MG/1.5ML SOPN INJECT 0.5MG UNDER THE SKIN EVERY WEEK   solifenacin (VESICARE) 5 MG tablet Take 1 tablet (5 mg total) by mouth daily.   SYNTHROID 50 MCG tablet TAKE 1 TABLET DAILY   vitamin B-12 (CYANOCOBALAMIN) 1000 MCG tablet Take 1,000 mcg by mouth daily.   WIXELA INHUB 100-50 MCG/ACT AEPB Inhale 1 puff into the lungs 2 (two) times daily.   Facility-Administered Encounter Medications as of 11/23/2020  Medication   cyanocobalamin ((VITAMIN B-12)) injection 1,000 mcg   Recent Relevant Labs: Lab Results  Component Value Date/Time   HGBA1C 6.9 (H) 07/31/2020 04:19 PM   HGBA1C 7.0 (H) 03/14/2020 02:46 PM   MICROALBUR 30 07/30/2018 12:32 PM    Kidney Function Lab Results  Component Value Date/Time   CREATININE 1.01 07/31/2020 04:19 PM   CREATININE 1.06 03/14/2020 02:46 PM   GFRNONAA 68 03/14/2020 02:46 PM   GFRAA 79 03/14/2020 02:46 PM    Current antihyperglycemic regimen:  Ozempic 0.5 mg weekly Jardiance 10 mg daily  What recent interventions/DTPs have been made to improve glycemic control:  Educated on Exercise goal of 150 minutes per week; Benefits of weight loss Carbohydrate counting and/or plate method Recommended that patient [facility] Counseled to check feet daily and get yearly eye exams  Have there been any recent hospitalizations or ED visits since last visit with [facility]? No  Patient denies hypoglycemic symptoms  Patient denies hyperglycemic symptoms  How often are you checking your blood sugar? Patient states twice weekly.  What are your blood sugars ranging? Patient stated he wasn't around sugar log but last ready was in normal range Fasting: None Before meals: None After meals: None Bedtime: None  During the week, how often does your blood glucose drop below 70? Never  Are you checking your feet daily/regularly?  Patient stated daily  Adherence Review: Is the patient currently on a STATIN medication? Yes Is the patient currently on ACE/ARB medication? Yes Does the patient have >5 day gap between last estimated fill dates? Yes   Care Gaps: Yearly ophthalmology exam overdue Medicare wellness 12-07-2020 Covid booster overdue  [facility] Rating Drugs: Atorvastatin 20 mg- Last filled 06-26-2020 [facility] Ozempic 0.5 mg- Last filled 10-30-2020 [facility] Losartan 50 mg- Last filled 09-28-2020 [facility] Jardiance 10 mg- Last filled 09-28-2020 [facility]  [place] Pharmacist Assistant [phone]

## 2020-11-29 ENCOUNTER — Encounter: Payer: Self-pay | Admitting: Nurse Practitioner

## 2020-12-06 ENCOUNTER — Other Ambulatory Visit: Payer: Self-pay

## 2020-12-06 ENCOUNTER — Ambulatory Visit (INDEPENDENT_AMBULATORY_CARE_PROVIDER_SITE_OTHER): Payer: Medicare Other | Admitting: Internal Medicine

## 2020-12-06 ENCOUNTER — Encounter: Payer: Self-pay | Admitting: Internal Medicine

## 2020-12-06 ENCOUNTER — Telehealth: Payer: Self-pay

## 2020-12-06 VITALS — BP 118/62 | HR 59 | Temp 97.6°F | Ht 70.0 in | Wt 244.0 lb

## 2020-12-06 DIAGNOSIS — N182 Chronic kidney disease, stage 2 (mild): Secondary | ICD-10-CM

## 2020-12-06 DIAGNOSIS — Z79899 Other long term (current) drug therapy: Secondary | ICD-10-CM | POA: Diagnosis not present

## 2020-12-06 DIAGNOSIS — E1122 Type 2 diabetes mellitus with diabetic chronic kidney disease: Secondary | ICD-10-CM

## 2020-12-06 DIAGNOSIS — G3184 Mild cognitive impairment, so stated: Secondary | ICD-10-CM

## 2020-12-06 DIAGNOSIS — I1310 Hypertensive heart and chronic kidney disease without heart failure, with stage 1 through stage 4 chronic kidney disease, or unspecified chronic kidney disease: Secondary | ICD-10-CM | POA: Diagnosis not present

## 2020-12-06 DIAGNOSIS — E785 Hyperlipidemia, unspecified: Secondary | ICD-10-CM

## 2020-12-06 DIAGNOSIS — Z6835 Body mass index (BMI) 35.0-35.9, adult: Secondary | ICD-10-CM | POA: Diagnosis not present

## 2020-12-06 MED ORDER — DONEPEZIL HCL 10 MG PO TABS: 10.0000 mg | ORAL_TABLET | Freq: Every day | ORAL | 3 refills | Status: DC

## 2020-12-06 NOTE — Patient Instructions (Signed)

## 2020-12-06 NOTE — Progress Notes (Signed)
I,Tianna Badgett,acting as a Education administrator for [name], MD.,have documented all relevant documentation on the behalf of [name], MD,as directed by  [name], MD while in the presence of [name], MD.  This visit occurred during the SARS-CoV-2 public health emergency.  Safety protocols were in place, including screening questions prior to the visit, additional usage of staff PPE, and extensive cleaning of exam room while observing appropriate contact time as indicated for disinfecting solutions.  Subjective:     Patient ID: Franklin Thornton , male    DOB: 1945-10-16 , 75 y.o.   MRN: [id number]   Chief Complaint  Patient presents with  . Diabetes  . Hypertension    HPI  Patient is here for diabetes and hypertension follow up. He reports compliance with meds.  He reports sugars are between 160s-180s. He admits that he sometimes pretends he does not have diabetes. Therefore, he will eat/drink things that he knows are not ideal for him.  Diabetes He presents for his follow-up diabetic visit. He has type 2 diabetes mellitus. His disease course has been improving. Pertinent negatives for hypoglycemia include no dizziness or headaches. Pertinent negatives for diabetes include no blurred vision, no chest pain, no fatigue, no polydipsia, no polyphagia and no polyuria. There are no hypoglycemic complications. Risk factors for coronary artery disease include diabetes mellitus, dyslipidemia, hypertension, male sex, obesity and sedentary lifestyle. His breakfast blood glucose is taken between 9-10 am. His breakfast blood glucose range is generally 90-110 mg/dl. An ACE inhibitor/angiotensin II receptor blocker is being taken. Eye exam is current.  Hypertension This is a chronic problem. The current episode started more than 1 year ago. Pertinent negatives include no blurred vision, chest pain, headaches, palpitations or shortness of breath.    Past Medical History:  Diagnosis Date  .  Atrial fibrillation ([place])   . Diabetes mellitus without complication ([place])   . Peripheral vascular disease ([place])   . Prostate cancer ([place])   . Uvular edema 07/17/2015     Family History  Problem Relation Age of Onset  . Lung cancer Mother   . Diabetes Mother   . Esophageal cancer Father   . Hypertension Sister   . Breast cancer Sister   . Healthy Son   . Healthy Daughter      Current Outpatient Medications:  .  albuterol (PROVENTIL HFA;VENTOLIN HFA) 108 (90 Base) MCG/ACT inhaler, Inhale 2 puffs into the lungs every 6 (six) hours as needed for wheezing or shortness of breath., Disp: , Rfl:  .  albuterol (PROVENTIL) (2.5 MG/3ML) 0.083% nebulizer solution, Take 3 mLs (2.5 mg total) by nebulization every 6 (six) hours as needed for wheezing or shortness of breath., Disp: 75 mL, Rfl: 3 .  amiodarone (PACERONE) 100 MG tablet, Take 100 mg by mouth daily. , Disp: , Rfl:  .  Armodafinil 250 MG tablet, Take 125-250 mg by mouth every morning., Disp: , Rfl:  .  aspirin 81 MG tablet, Take 81 mg by mouth daily., Disp: , Rfl:  .  atorvastatin (LIPITOR) 20 MG tablet, Take 1 tablet (20 mg total) by mouth daily., Disp: 90 tablet, Rfl: 1 .  b complex vitamins capsule, Take 1 capsule by mouth daily., Disp: , Rfl:  .  benzonatate (TESSALON PERLES) 100 MG capsule, Take 1 capsule (100 mg total) by mouth 3 (three) times daily as needed for cough., Disp: 30 capsule, Rfl: 0 .  carvedilol (COREG) 6.25 MG tablet, Take 6.25 mg by mouth  2 (two) times daily with a meal., Disp: , Rfl:  .  Cholecalciferol (VITAMIN D) 2000 units tablet, Take 2,000 Units by mouth daily., Disp: , Rfl:  .  diphenhydrAMINE (BENADRYL) 25 MG tablet, Take 25 mg by mouth every 6 (six) hours as needed for itching. , Disp: , Rfl:  .  donepezil (ARICEPT) 10 MG tablet, Take 1 tablet (10 mg total) by mouth at bedtime., Disp: 90 tablet, Rfl: 3 .  DULoxetine (CYMBALTA) 30 MG capsule, TAKE 1 CAPSULE DAILY IN THE EVENING, Disp: 90 capsule, Rfl: 3 .   furosemide (LASIX) 20 MG tablet, Take 20 mg by mouth daily as needed. , Disp: , Rfl:  .  gabapentin (NEURONTIN) 600 MG tablet, Take 600 mg by mouth 2 (two) times daily. One in the morning one in the morning and one at night., Disp: , Rfl:  .  glucose monitoring kit (FREESTYLE) monitoring kit, 1 each by Does not apply route as needed for other., Disp: , Rfl:  .  HYDROcodone-acetaminophen (NORCO/VICODIN) 5-325 MG tablet, Take 1 tablet by mouth every 6 (six) hours as needed for moderate pain., Disp: , Rfl:  .  ipratropium (ATROVENT) 0.03 % nasal spray, Place 2 sprays into both nostrils every 12 (twelve) hours., Disp: , Rfl:  .  JARDIANCE 10 MG TABS tablet, TAKE 1 TABLET DAILY, Disp: 90 tablet, Rfl: 3 .  levocetirizine (XYZAL) 5 MG tablet, Take 5 mg by mouth every evening. , Disp: , Rfl:  .  lidocaine (LIDODERM) 5 %, Place 3 patches onto the skin daily. May use 1-3 patches at one time, Remove & Discard patch within 12 hours or as directed by MD, Disp: 90 patch, Rfl: 0 .  losartan (COZAAR) 50 MG tablet, Take 50 mg by mouth 2 (two) times daily. , Disp: , Rfl:  .  Magnesium 250 MG TABS, Take 1 tablet by mouth daily., Disp: , Rfl:  .  methocarbamol (ROBAXIN) 750 MG tablet, Take 750 mg by mouth 3 (three) times daily as needed for muscle spasms. , Disp: , Rfl:  .  mirabegron ER (MYRBETRIQ) 50 MG TB24 tablet, Take 50 mg by mouth daily., Disp: , Rfl:  .  montelukast (SINGULAIR) 10 MG tablet, Take 10 mg by mouth at bedtime., Disp: , Rfl:  .  Multiple Vitamin (MULTIVITAMIN) tablet, Take 1 tablet by mouth daily., Disp: , Rfl:  .  nitroGLYCERIN (NITROSTAT) 0.4 MG SL tablet, Place 0.4 mg under the tongue every 5 (five) minutes as needed for chest pain. , Disp: , Rfl:  .  Omega-3 Fatty Acids (FISH OIL) 1200 MG CAPS, Take 1,200 mg by mouth. , Disp: , Rfl:  .  pantoprazole (PROTONIX) 40 MG tablet, Take 1 tablet (40 mg total) by mouth daily., Disp: 90 tablet, Rfl: 1 .  pramipexole (MIRAPEX) 0.5 MG tablet, Take 2 tablets  (1 mg total) by mouth in the morning and at bedtime. (Patient taking differently: Take 1 mg by mouth in the morning and at bedtime. One in the morning one in the afternoon and 2 at night.), Disp: 90 tablet, Rfl: 1 .  rivaroxaban (XARELTO) 10 MG TABS tablet, Take 10 mg by mouth daily., Disp: , Rfl:  .  Semaglutide,0.25 or 0.5MG /DOS, (OZEMPIC, 0.25 OR 0.5 MG/DOSE,) 2 MG/1.5ML SOPN, INJECT 0.5MG  UNDER THE SKIN EVERY WEEK, Disp: 45 mL, Rfl: 3 .  solifenacin (VESICARE) 5 MG tablet, Take 1 tablet (5 mg total) by mouth daily., Disp: 90 tablet, Rfl: 1 .  SYNTHROID 50 MCG tablet, TAKE 1  TABLET DAILY, Disp: 90 tablet, Rfl: 3 .  vitamin B-12 (CYANOCOBALAMIN) 1000 MCG tablet, Take 1,000 mcg by mouth daily., Disp: , Rfl:  .  WIXELA INHUB 100-50 MCG/ACT AEPB, Inhale 1 puff into the lungs 2 (two) times daily., Disp: 60 each, Rfl: 1  Current Facility-Administered Medications:  .  cyanocobalamin ((VITAMIN B-12)) injection 1,000 mcg, 1,000 mcg, Intramuscular, Once, [name], MD   Allergies  Allergen Reactions  . No Known Allergies      Review of Systems  Constitutional: Negative.  Negative for fatigue.  Eyes:  Negative for blurred vision.  Respiratory: Negative.  Negative for shortness of breath.   Cardiovascular: Negative.  Negative for chest pain and palpitations.  Gastrointestinal: Negative.   Endocrine: Negative for polydipsia, polyphagia and polyuria.  Neurological: Negative.  Negative for dizziness and headaches.  All other systems reviewed and are negative.   Today's Vitals   12/06/20 1421  BP: 118/62  Pulse: (!) 59  Temp: 97.6 F (36.4 C)  TempSrc: Oral  Weight: 244 lb (110.7 kg)  Height: 5\' 10"  (1.778 m)   Body mass index is 35.01 kg/m.   Objective:  Physical Exam Vitals and nursing note reviewed.  Constitutional:      Appearance: Normal appearance.  HENT:     Head: Normocephalic and atraumatic.     Nose:     Comments: Masked     Mouth/Throat:     Comments: Masked   Cardiovascular:     Rate and Rhythm: Normal rate and regular rhythm.     Heart sounds: Normal heart sounds.  Pulmonary:     Effort: Pulmonary effort is normal.     Breath sounds: Normal breath sounds.  Abdominal:     General: Bowel sounds are normal. There is distension.     Palpations: Abdomen is soft.  Musculoskeletal:     Cervical back: Normal range of motion.  Skin:    General: Skin is warm.  Neurological:     General: No focal deficit present.     Mental Status: He is alert.  Psychiatric:        Mood and Affect: Mood normal.        Assessment And Plan:     1. Type 2 diabetes mellitus with stage 2 chronic kidney disease, without long-term current use of insulin (HCC) Comments: Chronic, I will check labs as listed below. Importanc eof increasing daily activity was d/w patient.  - CMP14+EGFR - Hemoglobin A1c  2. Hypertensive heart and renal disease with renal failure, stage 1 through stage 4 or unspecified chronic kidney disease, without heart failure Comments: Chronic, well controlled. He is encouraged to follow low sodium diet. I will not make any medication changes.   3. Mild cognitive impairment Comments: I will send rx donepezil as requested. He is also followed by Neuro.  4. Class 2 severe obesity due to excess calories with serious comorbidity and body mass index (BMI) of 35.0 to 35.9 in adult [facility]) Comments: Chronic.   He is encouraged to initially strive for BMI less than 30 to decrease cardiac risk. He is advised to exercise no less than 150 minutes per week.    5. Drug therapy Comments: He agrees to participate in ActX pilot study. He will discuss further, submit sample to [facility] pharmacy team.    Patient was given opportunity to ask questions. Patient verbalized understanding of the plan and was able to repeat key elements of the plan. All questions were answered to their satisfaction.  I, [name], MD, have reviewed all documentation for this visit.  The documentation on 12/06/20 for the exam, diagnosis, procedures, and orders are all accurate and complete.   IF YOU HAVE BEEN REFERRED TO A SPECIALIST, IT MAY TAKE 1-2 WEEKS TO SCHEDULE/PROCESS THE REFERRAL. IF YOU HAVE NOT HEARD FROM US/SPECIALIST IN TWO WEEKS, PLEASE GIVE [place] A CALL AT [phone].   THE PATIENT IS ENCOURAGED TO PRACTICE SOCIAL DISTANCING DUE TO THE COVID-19 PANDEMIC.

## 2020-12-06 NOTE — Chronic Care Management (AMB) (Signed)
   Chronic Care Management Pharmacy Note  12/06/2020 Name:  Franklin Thornton MRN:  [id number] DOB:  1945-07-28  Summary: Patient presents for ACT-X pilot testing.    Subjective: Franklin Thornton is an 75 y.o. year old male who is a primary patient of [name], MD.  The CCM team was consulted for assistance with disease management and care coordination needs.    Patient reports doing well with all of his medications. No changes noted at this time. Will follow up with patient during next office visit.   Orlando Penner, PharmD Clinical Pharmacist [facility] [phone]

## 2020-12-07 ENCOUNTER — Ambulatory Visit (INDEPENDENT_AMBULATORY_CARE_PROVIDER_SITE_OTHER): Payer: Medicare Other

## 2020-12-07 VITALS — Ht 70.0 in | Wt 244.0 lb

## 2020-12-07 DIAGNOSIS — Z0000 Encounter for general adult medical examination without abnormal findings: Secondary | ICD-10-CM | POA: Diagnosis not present

## 2020-12-07 LAB — CMP14+EGFR
ALT: 26 IU/L (ref 0–44)
AST: 25 IU/L (ref 0–40)
Albumin/Globulin Ratio: 1.5 (ref 1.2–2.2)
Albumin: 4.1 g/dL (ref 3.7–4.7)
Alkaline Phosphatase: 68 IU/L (ref 44–121)
BUN/Creatinine Ratio: 20 (ref 10–24)
BUN: 19 mg/dL (ref 8–27)
Bilirubin Total: 0.4 mg/dL (ref 0.0–1.2)
CO2: 27 mmol/L (ref 20–29)
Calcium: 9.6 mg/dL (ref 8.6–10.2)
Chloride: 102 mmol/L (ref 96–106)
Creatinine, Ser: 0.97 mg/dL (ref 0.76–1.27)
Globulin, Total: 2.7 g/dL (ref 1.5–4.5)
Glucose: 107 mg/dL — ABNORMAL HIGH (ref 65–99)
Potassium: 4.8 mmol/L (ref 3.5–5.2)
Sodium: 141 mmol/L (ref 134–144)
Total Protein: 6.8 g/dL (ref 6.0–8.5)
eGFR: 81 mL/min/{1.73_m2} (ref >59)

## 2020-12-07 LAB — HEMOGLOBIN A1C
Est. average glucose Bld gHb Est-mCnc: 154 mg/dL
Hgb A1c MFr Bld: 7 % — ABNORMAL HIGH (ref 4.8–5.6)

## 2020-12-07 NOTE — Patient Instructions (Signed)
[name] , Thank you for taking time to come for your Medicare Wellness Visit. I appreciate your ongoing commitment to your health goals. Please review the following plan we discussed and let me know if I can assist you in the future.   Screening recommendations/referrals: Colonoscopy: completed 06/24/2019 Recommended yearly ophthalmology/optometry visit for glaucoma screening and checkup Recommended yearly dental visit for hygiene and checkup  Vaccinations: Influenza vaccine: due Pneumococcal vaccine: completed 08/31/2014 Tdap vaccine: completed 11/03/2015, due 11/02/2025 Shingles vaccine: completed   Covid-19:  02/18/2020, 07/21/2019, 06/30/2019  Advanced directives: Advance directive discussed with you today.   Conditions/risks identified: none  Next appointment: Follow up in one year for your annual wellness visit.   Preventive Care 40 Years and Older, Male Preventive care refers to lifestyle choices and visits with your health care provider that can promote health and wellness. What does preventive care include? A yearly physical exam. This is also called an annual well check. Dental exams once or twice a year. Routine eye exams. Ask your health care provider how often you should have your eyes checked. Personal lifestyle choices, including: Daily care of your teeth and gums. Regular physical activity. Eating a healthy diet. Avoiding tobacco and drug use. Limiting alcohol use. Practicing safe sex. Taking low doses of aspirin every day. Taking vitamin and mineral supplements as recommended by your health care provider. What happens during an annual well check? The services and screenings done by your health care provider during your annual well check will depend on your age, overall health, lifestyle risk factors, and family history of disease. Counseling  Your health care provider may ask you questions about your: Alcohol use. Tobacco use. Drug use. Emotional  well-being. Home and relationship well-being. Sexual activity. Eating habits. History of falls. Memory and ability to understand (cognition). Work and work Statistician. Screening  You may have the following tests or measurements: Height, weight, and BMI. Blood pressure. Lipid and cholesterol levels. These may be checked every 5 years, or more frequently if you are over 72 years old. Skin check. Lung cancer screening. You may have this screening every year starting at age 86 if you have a 30-pack-year history of smoking and currently smoke or have quit within the past 15 years. Fecal occult blood test (FOBT) of the stool. You may have this test every year starting at age 3. Flexible sigmoidoscopy or colonoscopy. You may have a sigmoidoscopy every 5 years or a colonoscopy every 10 years starting at age 65. Prostate cancer screening. Recommendations will vary depending on your family history and other risks. Hepatitis C blood test. Hepatitis B blood test. Sexually transmitted disease (STD) testing. Diabetes screening. This is done by checking your blood sugar (glucose) after you have not eaten for a while (fasting). You may have this done every 1-3 years. Abdominal aortic aneurysm (AAA) screening. You may need this if you are a current or former smoker. Osteoporosis. You may be screened starting at age 30 if you are at high risk. Talk with your health care provider about your test results, treatment options, and if necessary, the need for more tests. Vaccines  Your health care provider may recommend certain vaccines, such as: Influenza vaccine. This is recommended every year. Tetanus, diphtheria, and acellular pertussis (Tdap, Td) vaccine. You may need a Td booster every 10 years. Zoster vaccine. You may need this after age 67. Pneumococcal 13-valent conjugate (PCV13) vaccine. One dose is recommended after age 64. Pneumococcal polysaccharide (PPSV23) vaccine. One dose is recommended after  age 72. Talk to your health care provider about which screenings and vaccines you need and how often you need them. This information is not intended to replace advice given to you by your health care provider. Make sure you discuss any questions you have with your health care provider. Document Released: 05/05/2015 Document Revised: 12/27/2015 Document Reviewed: 02/07/2015 Elsevier Interactive Patient Education  2017 [place] Prevention in the Home Falls can cause injuries. They can happen to people of all ages. There are many things you can do to make your home safe and to help prevent falls. What can I do on the outside of my home? Regularly fix the edges of walkways and driveways and fix any cracks. Remove anything that might make you trip as you walk through a door, such as a raised step or threshold. Trim any bushes or trees on the path to your home. Use bright outdoor lighting. Clear any walking paths of anything that might make someone trip, such as rocks or tools. Regularly check to see if handrails are loose or broken. Make sure that both sides of any steps have handrails. Any raised decks and porches should have guardrails on the edges. Have any leaves, snow, or ice cleared regularly. Use sand or salt on walking paths during winter. Clean up any spills in your garage right away. This includes oil or grease spills. What can I do in the bathroom? Use night lights. Install grab bars by the toilet and in the tub and shower. Do not use towel bars as grab bars. Use non-skid mats or decals in the tub or shower. If you need to sit down in the shower, use a plastic, non-slip stool. Keep the floor dry. Clean up any water that spills on the floor as soon as it happens. Remove soap buildup in the tub or shower regularly. Attach bath mats securely with double-sided non-slip rug tape. Do not have throw rugs and other things on the floor that can make you trip. What can I do in the  bedroom? Use night lights. Make sure that you have a light by your bed that is easy to reach. Do not use any sheets or blankets that are too big for your bed. They should not hang down onto the floor. Have a firm chair that has side arms. You can use this for support while you get dressed. Do not have throw rugs and other things on the floor that can make you trip. What can I do in the kitchen? Clean up any spills right away. Avoid walking on wet floors. Keep items that you use a lot in easy-to-reach places. If you need to reach something above you, use a strong step stool that has a grab bar. Keep electrical cords out of the way. Do not use floor polish or wax that makes floors slippery. If you must use wax, use non-skid floor wax. Do not have throw rugs and other things on the floor that can make you trip. What can I do with my stairs? Do not leave any items on the stairs. Make sure that there are handrails on both sides of the stairs and use them. Fix handrails that are broken or loose. Make sure that handrails are as long as the stairways. Check any carpeting to make sure that it is firmly attached to the stairs. Fix any carpet that is loose or worn. Avoid having throw rugs at the top or bottom of the stairs. If you do have  throw rugs, attach them to the floor with carpet tape. Make sure that you have a light switch at the top of the stairs and the bottom of the stairs. If you do not have them, ask someone to add them for you. What else can I do to help prevent falls? Wear shoes that: Do not have high heels. Have rubber bottoms. Are comfortable and fit you well. Are closed at the toe. Do not wear sandals. If you use a stepladder: Make sure that it is fully opened. Do not climb a closed stepladder. Make sure that both sides of the stepladder are locked into place. Ask someone to hold it for you, if possible. Clearly mark and make sure that you can see: Any grab bars or  handrails. First and last steps. Where the edge of each step is. Use tools that help you move around (mobility aids) if they are needed. These include: Canes. Walkers. Scooters. Crutches. Turn on the lights when you go into a dark area. Replace any light bulbs as soon as they burn out. Set up your furniture so you have a clear path. Avoid moving your furniture around. If any of your floors are uneven, fix them. If there are any pets around you, be aware of where they are. Review your medicines with your doctor. Some medicines can make you feel dizzy. This can increase your chance of falling. Ask your doctor what other things that you can do to help prevent falls. This information is not intended to replace advice given to you by your health care provider. Make sure you discuss any questions you have with your health care provider. Document Released: 02/02/2009 Document Revised: 09/14/2015 Document Reviewed: 05/13/2014 Elsevier Interactive Patient Education  2017 [employer].

## 2020-12-07 NOTE — Progress Notes (Signed)
I connected with Franklin Thornton today by telephone and verified that I am speaking with the correct person using two identifiers. Location patient: home Location provider: work Persons participating in the virtual visit: Franklin Thornton, Glenna Durand LPN.   I discussed the limitations, risks, security and privacy concerns of performing an evaluation and management service by telephone and the availability of in person appointments. I also discussed with the patient that there may be a patient responsible charge related to this service. The patient expressed understanding and verbally consented to this telephonic visit.    Interactive audio and video telecommunications were attempted between this provider and patient, however failed, due to patient having technical difficulties OR patient did not have access to video capability.  We continued and completed visit with audio only.     Vital signs may be patient reported or missing.  Subjective:   Franklin Thornton is a 75 y.o. male who presents for Medicare Annual/Subsequent preventive examination.  Review of Systems     Cardiac Risk Factors include: advanced age (>16mn, >>43women);diabetes mellitus;hypertension;male gender;sedentary lifestyle;obesity (BMI >30kg/m2)     Objective:    Today's Vitals   12/07/20 0911 12/07/20 0912  Weight: 244 lb (110.7 kg)   Height: _0  (1.778 m)   PainSc:  8    Body mass index is 35.01 kg/m.  Advanced Directives 12/07/2020 11/24/2019 11/18/2018 08/28/2017 07/28/2017 08/14/2015  Does Patient Have a Medical Advance Directive? No Yes Yes No No No  Type of Advance Directive - HKemmererLiving will H[place]ag[place] Chart? - No - copy requested No - copy requested - - -  Would patient like information on creating a medical advance directive? - - - - Yes (MAU/Ambulatory/Procedural Areas - Information given) No - patient declined information     Current Medications (verified) Outpatient Encounter Medications as of 12/07/2020  Medication Sig   albuterol (PROVENTIL HFA;VENTOLIN HFA) 108 (90 Base) MCG/ACT inhaler Inhale 2 puffs into the lungs every 6 (six) hours as needed for wheezing or shortness of breath.   albuterol (PROVENTIL) (2.5 MG/3ML) 0.083% nebulizer solution Take 3 mLs (2.5 mg total) by nebulization every 6 (six) hours as needed for wheezing or shortness of breath.   amiodarone (PACERONE) 100 MG tablet Take 100 mg by mouth daily.    Armodafinil 250 MG tablet Take 125-250 mg by mouth every morning.   aspirin 81 MG tablet Take 81 mg by mouth daily.   atorvastatin (LIPITOR) 20 MG tablet Take 1 tablet (20 mg total) by mouth daily.   b complex vitamins capsule Take 1 capsule by mouth daily.   benzonatate (TESSALON PERLES) 100 MG capsule Take 1 capsule (100 mg total) by mouth 3 (three) times daily as needed for cough.   carvedilol (COREG) 6.25 MG tablet Take 6.25 mg by mouth 2 (two) times daily with a meal.   Cholecalciferol (VITAMIN D) 2000 units tablet Take 2,000 Units by mouth daily.   diphenhydrAMINE (BENADRYL) 25 MG tablet Take 25 mg by mouth every 6 (six) hours as needed for itching.    donepezil (ARICEPT) 10 MG tablet Take 1 tablet (10 mg total) by mouth at bedtime.   DULoxetine (CYMBALTA) 30 MG capsule TAKE 1 CAPSULE DAILY IN THE EVENING   furosemide (LASIX) 20 MG tablet Take 20 mg by mouth daily as needed.    gabapentin (NEURONTIN) 600 MG tablet Take 600 mg by mouth 2 (two) times  daily. One in the morning one in the morning and one at night.   glucose monitoring kit (FREESTYLE) monitoring kit 1 each by Does not apply route as needed for other.   HYDROcodone-acetaminophen (NORCO/VICODIN) 5-325 MG tablet Take 1 tablet by mouth every 6 (six) hours as needed for moderate pain.   ipratropium (ATROVENT) 0.03 % nasal spray Place 2 sprays into both nostrils every 12 (twelve) hours.   JARDIANCE 10 MG TABS tablet TAKE 1 TABLET  DAILY   levocetirizine (XYZAL) 5 MG tablet Take 5 mg by mouth every evening.    lidocaine (LIDODERM) 5 % Place 3 patches onto the skin daily. May use 1-3 patches at one time, Remove & Discard patch within 12 hours or as directed by MD   losartan (COZAAR) 50 MG tablet Take 50 mg by mouth 2 (two) times daily.    Magnesium 250 MG TABS Take 1 tablet by mouth daily.   methocarbamol (ROBAXIN) 750 MG tablet Take 750 mg by mouth 3 (three) times daily as needed for muscle spasms.    mirabegron ER (MYRBETRIQ) 50 MG TB24 tablet Take 50 mg by mouth daily.   montelukast (SINGULAIR) 10 MG tablet Take 10 mg by mouth at bedtime.   Multiple Vitamin (MULTIVITAMIN) tablet Take 1 tablet by mouth daily.   nitroGLYCERIN (NITROSTAT) 0.4 MG SL tablet Place 0.4 mg under the tongue every 5 (five) minutes as needed for chest pain.    Omega-3 Fatty Acids (FISH OIL) 1200 MG CAPS Take 1,200 mg by mouth.    pantoprazole (PROTONIX) 40 MG tablet Take 1 tablet (40 mg total) by mouth daily.   pramipexole (MIRAPEX) 0.5 MG tablet Take 2 tablets (1 mg total) by mouth in the morning and at bedtime. (Patient taking differently: Take 1 mg by mouth in the morning and at bedtime. One in the morning one in the afternoon and 2 at night.)   rivaroxaban (XARELTO) 10 MG TABS tablet Take 10 mg by mouth daily.   Semaglutide,0.25 or 0.5MG/DOS, (OZEMPIC, 0.25 OR 0.5 MG/DOSE,) 2 MG/1.5ML SOPN INJECT 0.5MG UNDER THE SKIN EVERY WEEK   solifenacin (VESICARE) 5 MG tablet Take 1 tablet (5 mg total) by mouth daily.   SYNTHROID 50 MCG tablet TAKE 1 TABLET DAILY   vitamin B-12 (CYANOCOBALAMIN) 1000 MCG tablet Take 1,000 mcg by mouth daily.   WIXELA INHUB 100-50 MCG/ACT AEPB Inhale 1 puff into the lungs 2 (two) times daily.   Facility-Administered Encounter Medications as of 12/07/2020  Medication   cyanocobalamin ((VITAMIN B-12)) injection 1,000 mcg    Allergies (verified) No known allergies   History: Past Medical History:  Diagnosis Date    Atrial fibrillation (HCC)    Diabetes mellitus without complication ([place])    Peripheral vascular disease ([place])    Prostate cancer ([place])    Uvular edema 07/17/2015   Past Surgical History:  Procedure Laterality Date   CORONARY ANGIOPLASTY WITH STENT PLACEMENT     x2   ENDOVENOUS ABLATION SAPHENOUS VEIN W/ LASER Left 08/19/2017   endovenous laser ablation left greater saphenous vein by Tinnie Gens MD    EXTERNAL EAR SURGERY  2020   right ear    UVULECTOMY  07/31/15   Family History  Problem Relation Age of Onset   Lung cancer Mother    Diabetes Mother    Esophageal cancer Father    Hypertension Sister    Breast cancer Sister    Healthy Son    Healthy Daughter    Social History  Socioeconomic History   Marital status: Married    Spouse name: Not on file   Number of children: Not on file   Years of education: Not on file   Highest education level: Not on file  Occupational History   Occupation: retired  Tobacco Use   Smoking status: Former    Packs/day: 2.50    Years: 12.00    Pack years: 30.00    Types: Cigarettes    Start date: 04/23/1959    Quit date: 08/14/1971    Years since quitting: 49.3   Smokeless tobacco: Never   Tobacco comments:    he has quit  Vaping Use   Vaping Use: Never used  Substance and Sexual Activity   Alcohol use: Not Currently    Alcohol/week: 1.0 standard drink    Types: 1 Standard drinks or equivalent per week    Comment: once a month   Drug use: Yes    Types: Hydrocodone   Sexual activity: Not Currently  Other Topics Concern   Not on file  Social History Narrative   Not on file   Social Determinants of Health   Financial Resource Strain: Low Risk    Difficulty of Paying Living Expenses: Not hard at all  Food Insecurity: No Food Insecurity   Worried About Charity fundraiser in the Last Year: Never true   [place] in the Last Year: Never true  Transportation Needs: No Transportation Needs   Lack of Transportation  (Medical): No   Lack of Transportation (Non-Medical): No  Physical Activity: Insufficiently Active   Days of Exercise per Week: 3 days   Minutes of Exercise per Session: 10 min  Stress: No Stress Concern Present   Feeling of Stress : Not at all  Social Connections: Not on file    Tobacco Counseling Counseling given: Not Answered Tobacco comments: he has quit   Clinical Intake:  Pre-visit preparation completed: Yes  Pain : 0-10 Pain Score: 8  Pain Type: Chronic pain Pain Location: Back Pain Orientation: Lower Pain Descriptors / Indicators: Dull, Aching Pain Onset: More than a month ago Pain Frequency: Constant     Nutritional Status: BMI > 30  Obese Nutritional Risks: None Diabetes: Yes  How often do you need to have someone help you when you read instructions, pamphlets, or other written materials from your doctor or pharmacy?: 1 - Never What is the last grade level you completed in school?: BA degree  Diabetic? Yes Nutrition Risk Assessment:  Has the patient had any N/V/D within the last 2 months?  No  Does the patient have any non-healing wounds?  No  Has the patient had any unintentional weight loss or weight gain?  No   Diabetes:  Is the patient diabetic?  Yes  If diabetic, was a CBG obtained today?  No  Did the patient bring in their glucometer from home?  No  How often do you monitor your CBG's? 2-3 days weekly.   Financial Strains and Diabetes Management:  Are you having any financial strains with the device, your supplies or your medication? No .  Does the patient want to be seen by Chronic Care Management for management of their diabetes?  No  Would the patient like to be referred to a Nutritionist or for Diabetic Management?  No   Diabetic Exams:  Diabetic Eye Exam: Overdue for diabetic eye exam. Pt has been advised about the importance in completing this exam. Patient advised to call and schedule  an eye exam. Diabetic Foot Exam: Completed  12/14/2019   Interpreter Needed?: No  Information entered by :: NAllen LPN   Activities of Daily Living In your present state of health, do you have any difficulty performing the following activities: 12/07/2020 12/06/2020  Hearing? N Y  Comment has hearing aides -  Vision? N N  Difficulty concentrating or making decisions? Tempie Donning  Walking or climbing stairs? Y Y  Dressing or bathing? N N  Doing errands, shopping? N N  Preparing Food and eating ? N -  Using the Toilet? N -  In the past six months, have you accidently leaked urine? Y -  Do you have problems with loss of bowel control? N -  Managing your Medications? N -  Managing your Finances? N -  Housekeeping or managing your Housekeeping? N -  Some recent data might be hidden    Patient Care Team: [name], MD as PCP - General (Internal Medicine) Gardiner Rhyme, MD as Referring Physician (Specialist) Clifton Custard, MD as Referring Physician (Internal Medicine) Warden Fillers, MD as Consulting Physician (Ophthalmology) Adrian Prows, MD as Consulting Physician (Cardiology) Caud[place], Kennieth Francois, [facility] (Inactive) (Pharmacist)  Indicate any recent Medical Services you may have received from other than [facility] providers in the past year (date may be approximate).     Assessment:   This is a routine wellness examination for Se[place].  Hearing/Vision screen Vision Screening - Comments:: Regular eye exams, Dr. Katy Fitch  Dietary issues and exercise activities discussed: Current Exercise Habits: Home exercise routine, Type of exercise: walking, Time (Minutes): 10, Frequency (Times/Week): 3, Weekly Exercise (Minutes/Week): 30   Goals Addressed             This Visit's Progress    Patient Stated       12/07/2020, no goals       Depression Screen PHQ 2/9 Scores 12/07/2020 12/06/2020 11/24/2019 12/30/2018 11/18/2018 06/17/2018 03/17/2018  PHQ - 2 Score 0 0 0 0 0 0 0  PHQ- 9 Score - 0 6 - 7 - -    Fall Risk Fall Risk  12/07/2020  12/06/2020 11/24/2019 12/30/2018 11/18/2018  Falls in the past year? 0 0 0 0 0  Comment - - - - -  Number falls in past yr: - 0 - - -  Injury with Fall? - 0 - - -  Risk for fall due to : Medication side effect - Medication side effect - Medication side effect  Follow up Falls evaluation completed;Education provided;Falls prevention discussed - Falls evaluation completed;Education provided;Falls prevention discussed - Falls evaluation completed;Education provided;Falls prevention discussed    FALL RISK PREVENTION PERTAINING TO THE HOME:  Any stairs in or around the home? No  If so, are there any without handrails? N/a Home free of loose throw rugs in walkways, pet beds, electrical cords, etc? No  Adequate lighting in your home to reduce risk of falls? No   ASSISTIVE DEVICES UTILIZED TO PREVENT FALLS:  Life alert? No  Use of a cane, walker or w/c? No  Grab bars in the bathroom? No  Shower chair or bench in shower? No  Elevated toilet seat or a handicapped toilet? No   TIMED UP AND GO:  Was the test performed? No .      Cognitive Function:     6CIT Screen 12/07/2020 11/24/2019 11/18/2018  What Year? 0 points 0 points 0 points  What month? 0 points 0 points 0 points  What time? 0 points 0 points 0  points  Count back from 20 0 points 0 points 0 points  Months in reverse 0 points 0 points 0 points  Repeat phrase 2 points 0 points 0 points  Total Score 2 0 0    Immunizations Immunization History  Administered Date(s) Administered   Fluad Quad(high Dose 65+) 03/14/2020   H1N1 07/04/2008   Influenza Split 02/20/2014   Influenza, High Dose Seasonal PF 12/30/2018   Influenza, Seasonal, Injecte, Preservative Fre 04/28/2015   Influenza,inj,Quad PF,6+ Mos 05/04/2018   Moderna SARS-COV2 Booster Vaccination 02/18/2020   Moderna Sars-Covid-2 Vaccination 06/30/2019, 07/21/2019   Pneumococcal Conjugate-13 03/10/2014   Pneumococcal Polysaccharide-23 08/31/2014   Pneumococcal-Unspecified  04/10/2010, 08/31/2014   Tdap 03/10/2014, 11/03/2015    TDAP status: Up to date  Flu Vaccine status: Due, Education has been provided regarding the importance of this vaccine. Advised may receive this vaccine at local pharmacy or Health Dept. Aware to provide a copy of the vaccination record if obtained from local pharmacy or Health Dept. Verbalized acceptance and understanding.  Pneumococcal vaccine status: Up to date  Covid-19 vaccine status: Completed vaccines  Qualifies for Shingles Vaccine? Yes   Zostavax completed No   Shingrix Completed?: Yes  Screening Tests Health Maintenance  Topic Date Due   OPHTHALMOLOGY EXAM  04/25/2020   COVID-19 Vaccine (5 - Booster) 07/18/2020   INFLUENZA VACCINE  11/20/2020   FOOT EXAM  12/13/2020   HEMOGLOBIN A1C  06/08/2021   TETANUS/TDAP  11/02/2025   COLONOSCOPY (Pts 45-37yr Insurance coverage will need to be confirmed)  06/23/2029   Hepatitis C Screening  Completed   PNA vac Low Risk Adult  Completed   Zoster Vaccines- Shingrix  Completed   HPV VACCINES  Aged Out    Health Maintenance  Health Maintenance Due  Topic Date Due   OPHTHALMOLOGY EXAM  04/25/2020   COVID-19 Vaccine (5 - Booster) 07/18/2020   INFLUENZA VACCINE  11/20/2020    Colorectal cancer screening: Type of screening: Colonoscopy. Completed 06/24/2019. Repeat every 10 years  Lung Cancer Screening: (Low Dose CT Chest recommended if Age 163-80years, 30 pack-year currently smoking OR have quit w/in 15years.) does not qualify.   Lung Cancer Screening Referral: no  Additional Screening:  Hepatitis C Screening: does qualify; Completed 07/22/2012  Vision Screening: Recommended annual ophthalmology exams for early detection of glaucoma and other disorders of the eye. Is the patient up to date with their annual eye exam?  No  Who is the provider or what is the name of the office in which the patient attends annual eye exams? Dr. GKaty FitchIf pt is not established with a  provider, would they like to be referred to a provider to establish care? No .   Dental Screening: Recommended annual dental exams for proper oral hygiene  Community Resource Referral / Chronic Care Management: CRR required this visit?  No   CCM required this visit?  No      Plan:     I have personally reviewed and noted the following in the patient's chart:   Medical and social history Use of alcohol, tobacco or illicit drugs  Current medications and supplements including opioid prescriptions. Patient is currently taking opioid prescriptions. Information provided to patient regarding non-opioid alternatives. Patient advised to discuss non-opioid treatment plan with their provider. Functional ability and status Nutritional status Physical activity Advanced directives List of other physicians Hospitalizations, surgeries, and ER visits in previous 12 months Vitals Screenings to include cognitive, depression, and falls Referrals and appointments  In addition,  I have reviewed and discussed with patient certain preventive protocols, quality metrics, and best practice recommendations. A written personalized care plan for preventive services as well as general preventive health recommendations were provided to patient.     Kellie Simmering, LPN   [id number]   Nurse Notes:

## 2020-12-08 DIAGNOSIS — G3184 Mild cognitive impairment, so stated: Secondary | ICD-10-CM | POA: Insufficient documentation

## 2020-12-10 ENCOUNTER — Other Ambulatory Visit: Payer: Self-pay | Admitting: Internal Medicine

## 2020-12-12 ENCOUNTER — Other Ambulatory Visit: Payer: Self-pay | Admitting: Internal Medicine

## 2020-12-21 DIAGNOSIS — R918 Other nonspecific abnormal finding of lung field: Secondary | ICD-10-CM | POA: Diagnosis not present

## 2020-12-21 DIAGNOSIS — C61 Malignant neoplasm of prostate: Secondary | ICD-10-CM | POA: Diagnosis not present

## 2020-12-26 DIAGNOSIS — M545 Low back pain, unspecified: Secondary | ICD-10-CM | POA: Diagnosis not present

## 2020-12-26 DIAGNOSIS — G4719 Other hypersomnia: Secondary | ICD-10-CM | POA: Diagnosis not present

## 2020-12-26 DIAGNOSIS — G2581 Restless legs syndrome: Secondary | ICD-10-CM | POA: Diagnosis not present

## 2020-12-26 DIAGNOSIS — M542 Cervicalgia: Secondary | ICD-10-CM | POA: Diagnosis not present

## 2020-12-26 DIAGNOSIS — G603 Idiopathic progressive neuropathy: Secondary | ICD-10-CM | POA: Diagnosis not present

## 2020-12-28 ENCOUNTER — Other Ambulatory Visit: Payer: Self-pay | Admitting: Internal Medicine

## 2020-12-28 DIAGNOSIS — Z79899 Other long term (current) drug therapy: Secondary | ICD-10-CM

## 2021-01-01 IMAGING — DX DG CHEST 2V
2 series · 2 of 2 positions shown · non-contrast
Comparison: 09/30/2016 chest CT

CLINICAL DATA: Cough and wheezing

EXAM:
CHEST - 2 VIEW

[dg chest 2 view (1 of 2)]
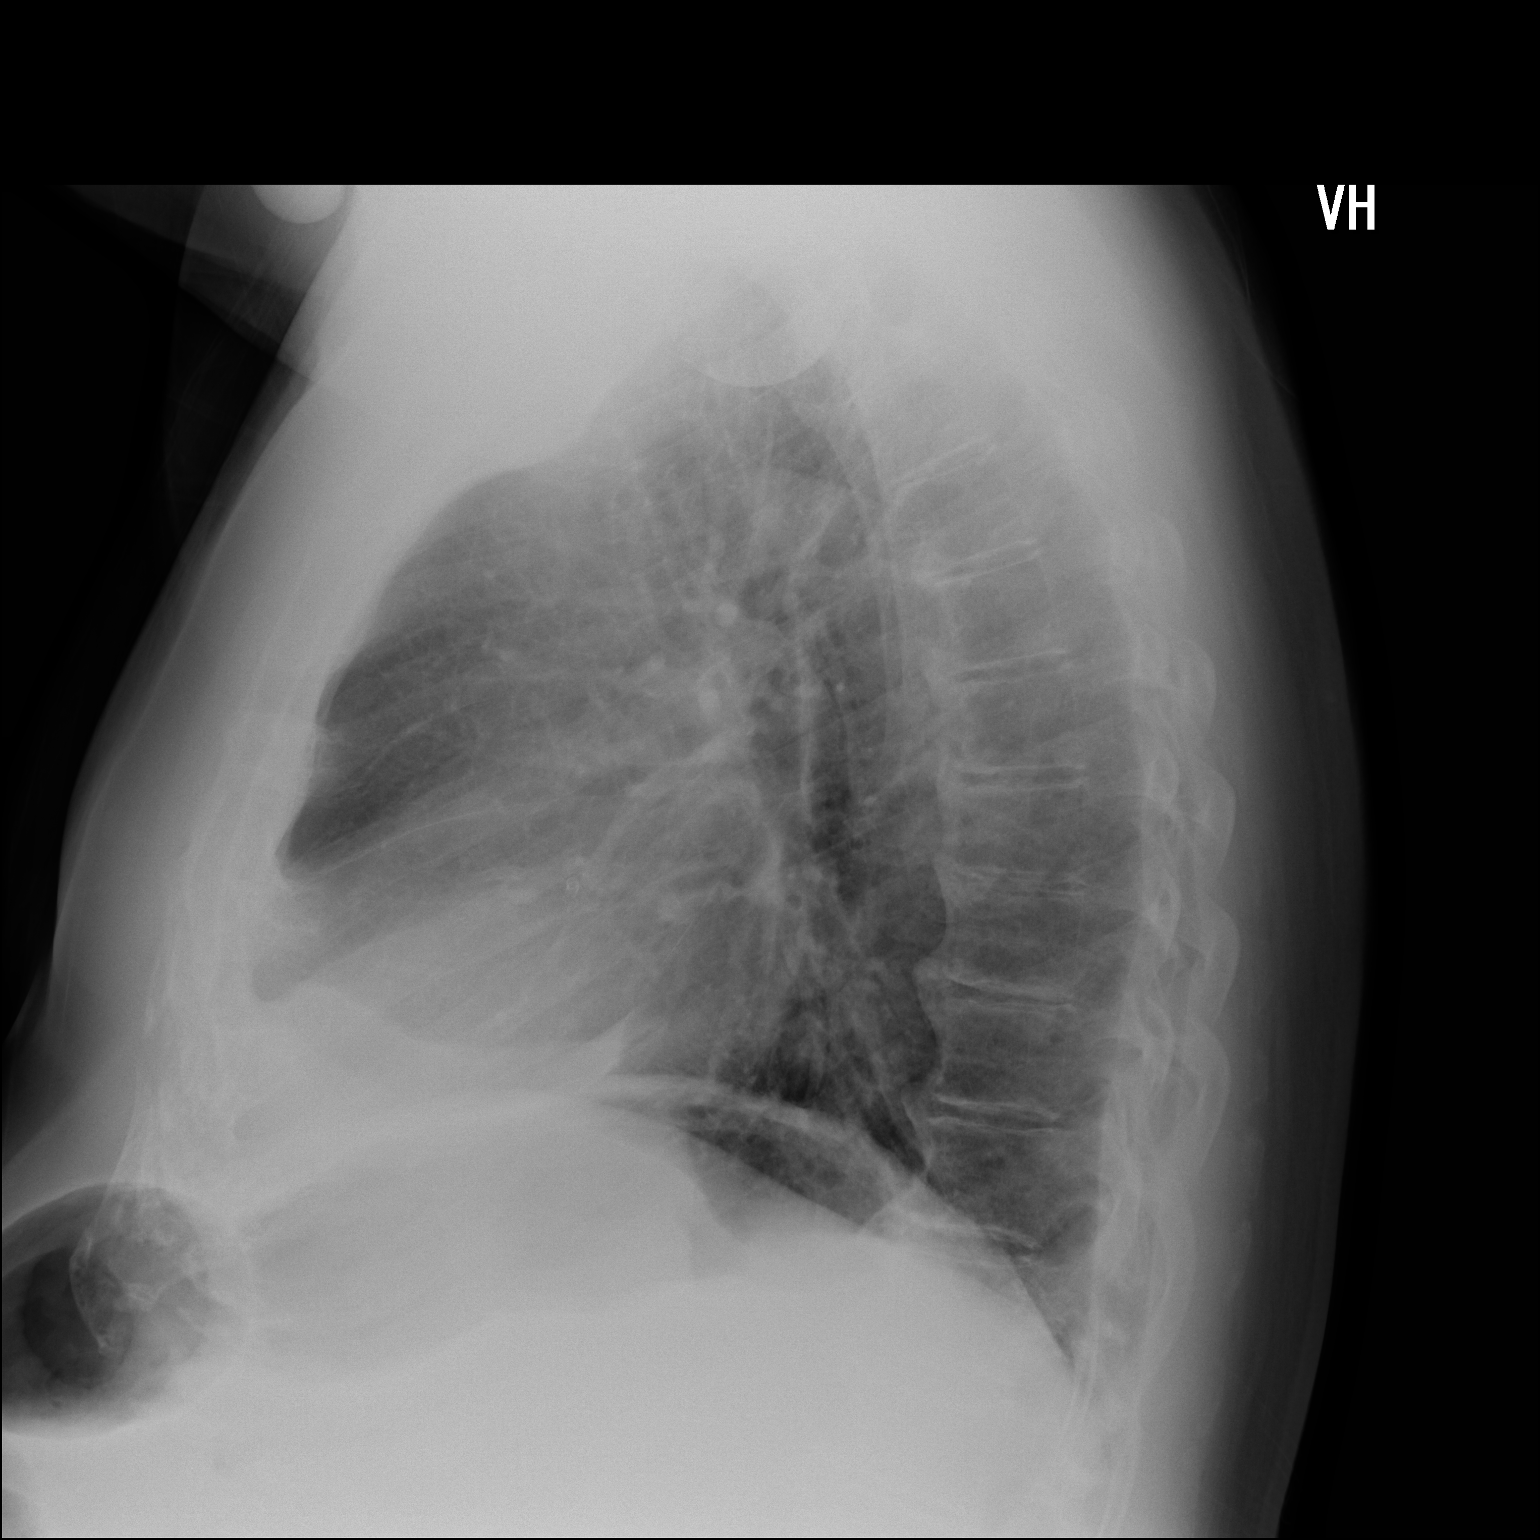

[dg chest 2 view (2 of 2)]
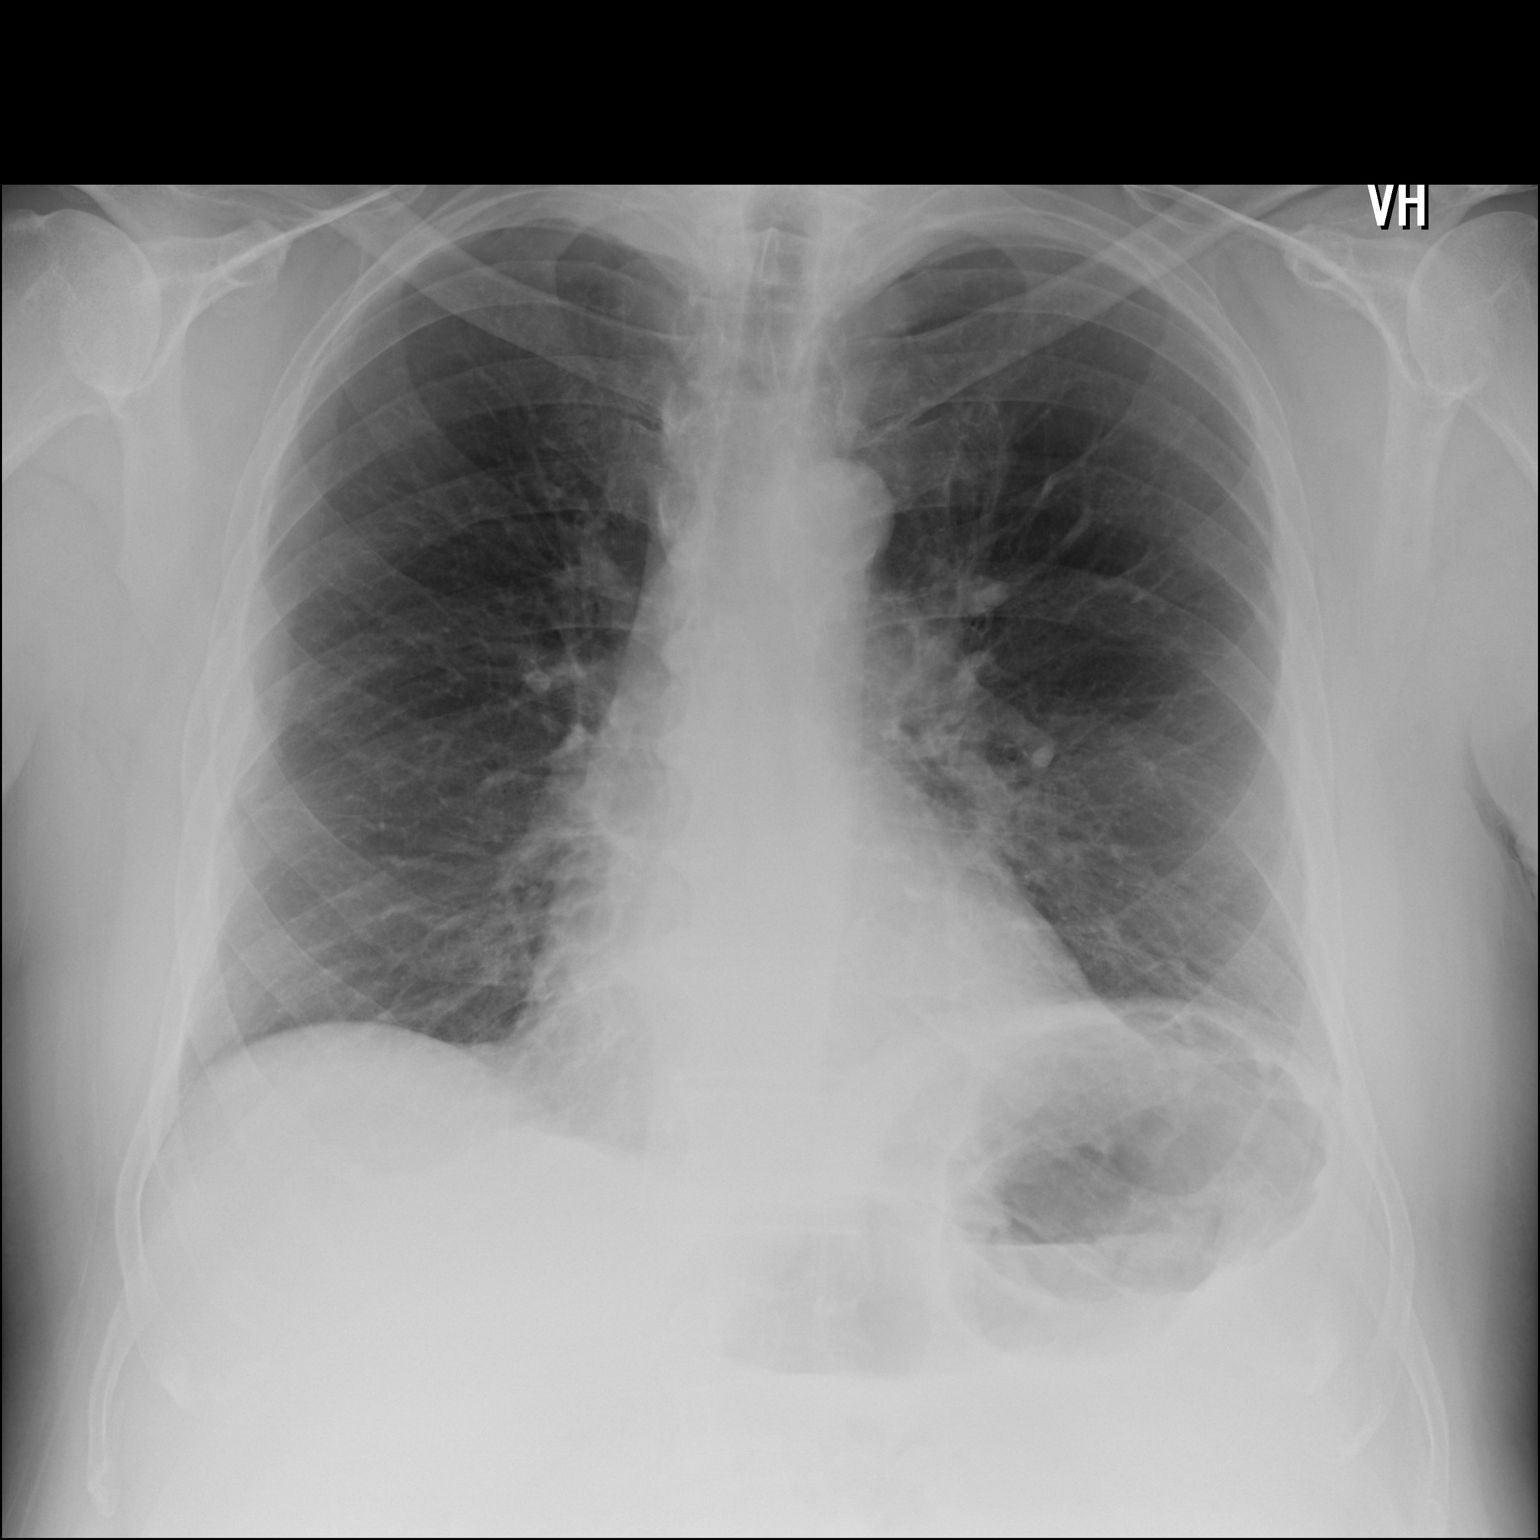

[2 of 2 positions shown; findings below may reference images not displayed]

FINDINGS: Atherosclerotic calcification of the aortic arch. Thoracic
spondylosis. Linear accentuated density at the left lung base
favoring subsegmental atelectasis. Heart size within normal limits.
No pleural effusion. The lungs appear otherwise clear.
IMPRESSION: 1. Linear subsegmental atelectasis at the left lung base.
2.  Aortic Atherosclerosis (4N3T1-HJ0.0).
3. Thoracic spondylosis.

## 2021-01-04 DIAGNOSIS — R498 Other voice and resonance disorders: Secondary | ICD-10-CM | POA: Diagnosis not present

## 2021-01-04 DIAGNOSIS — G4736 Sleep related hypoventilation in conditions classified elsewhere: Secondary | ICD-10-CM | POA: Diagnosis not present

## 2021-01-04 DIAGNOSIS — J4520 Mild intermittent asthma, uncomplicated: Secondary | ICD-10-CM | POA: Diagnosis not present

## 2021-01-04 DIAGNOSIS — J301 Allergic rhinitis due to pollen: Secondary | ICD-10-CM | POA: Diagnosis not present

## 2021-01-04 DIAGNOSIS — R918 Other nonspecific abnormal finding of lung field: Secondary | ICD-10-CM | POA: Diagnosis not present

## 2021-01-04 DIAGNOSIS — G2581 Restless legs syndrome: Secondary | ICD-10-CM | POA: Diagnosis not present

## 2021-01-04 DIAGNOSIS — R059 Cough, unspecified: Secondary | ICD-10-CM | POA: Diagnosis not present

## 2021-01-11 ENCOUNTER — Telehealth: Payer: Self-pay

## 2021-01-11 NOTE — Chronic Care Management (AMB) (Signed)
Chronic Care Management Pharmacy Assistant   Name: Franklin Thornton  MRN: [id number] DOB: 01/28/1946   Reason for Encounter: Disease State/ Diabetes  Recent office visits:  12-06-2020 [name], MD. STOP robitussin DM and Hydromet. A1C= 7.1. Glucose= 107.  12-07-2020 Kellie Simmering, LPN. Annual medicare visit  Recent consult visits:  12-21-2020 Jeannetta Ellis, MD (Radiology). Chest CT  Hospital visits:  None in previous 6 months  Medications: Outpatient Encounter Medications as of 01/11/2021  Medication Sig   albuterol (PROVENTIL HFA;VENTOLIN HFA) 108 (90 Base) MCG/ACT inhaler Inhale 2 puffs into the lungs every 6 (six) hours as needed for wheezing or shortness of breath.   albuterol (PROVENTIL) (2.5 MG/3ML) 0.083% nebulizer solution Take 3 mLs (2.5 mg total) by nebulization every 6 (six) hours as needed for wheezing or shortness of breath.   amiodarone (PACERONE) 100 MG tablet Take 100 mg by mouth daily.    Armodafinil 250 MG tablet Take 125-250 mg by mouth every morning.   aspirin 81 MG tablet Take 81 mg by mouth daily.   atorvastatin (LIPITOR) 20 MG tablet Take 1 tablet (20 mg total) by mouth daily.   b complex vitamins capsule Take 1 capsule by mouth daily.   benzonatate (TESSALON PERLES) 100 MG capsule Take 1 capsule (100 mg total) by mouth 3 (three) times daily as needed for cough.   carvedilol (COREG) 6.25 MG tablet Take 6.25 mg by mouth 2 (two) times daily with a meal.   Cholecalciferol (VITAMIN D) 2000 units tablet Take 2,000 Units by mouth daily.   diphenhydrAMINE (BENADRYL) 25 MG tablet Take 25 mg by mouth every 6 (six) hours as needed for itching.    donepezil (ARICEPT) 10 MG tablet Take 1 tablet (10 mg total) by mouth at bedtime.   DULoxetine (CYMBALTA) 30 MG capsule TAKE 1 CAPSULE DAILY IN THE EVENING   furosemide (LASIX) 20 MG tablet Take 20 mg by mouth daily as needed.    gabapentin (NEURONTIN) 600 MG tablet TAKE 1 TABLET THREE TIMES A DAY   glucose  monitoring kit (FREESTYLE) monitoring kit 1 each by Does not apply route as needed for other.   HYDROcodone-acetaminophen (NORCO/VICODIN) 5-325 MG tablet Take 1 tablet by mouth every 6 (six) hours as needed for moderate pain.   ipratropium (ATROVENT) 0.03 % nasal spray Place 2 sprays into both nostrils every 12 (twelve) hours.   JARDIANCE 10 MG TABS tablet TAKE 1 TABLET DAILY   levocetirizine (XYZAL) 5 MG tablet Take 5 mg by mouth every evening.    lidocaine (LIDODERM) 5 % Place 3 patches onto the skin daily. May use 1-3 patches at one time, Remove & Discard patch within 12 hours or as directed by MD   losartan (COZAAR) 50 MG tablet Take 50 mg by mouth 2 (two) times daily.    Magnesium 250 MG TABS Take 1 tablet by mouth daily.   methocarbamol (ROBAXIN) 750 MG tablet Take 750 mg by mouth 3 (three) times daily as needed for muscle spasms.    mirabegron ER (MYRBETRIQ) 50 MG TB24 tablet Take 50 mg by mouth daily.   montelukast (SINGULAIR) 10 MG tablet Take 10 mg by mouth at bedtime.   Multiple Vitamin (MULTIVITAMIN) tablet Take 1 tablet by mouth daily.   nitroGLYCERIN (NITROSTAT) 0.4 MG SL tablet Place 0.4 mg under the tongue every 5 (five) minutes as needed for chest pain.    Omega-3 Fatty Acids (FISH OIL) 1200 MG CAPS Take 1,200 mg by mouth.  pantoprazole (PROTONIX) 40 MG tablet Take 1 tablet (40 mg total) by mouth daily.   pramipexole (MIRAPEX) 0.5 MG tablet Take 2 tablets (1 mg total) by mouth in the morning and at bedtime. (Patient taking differently: Take 1 mg by mouth in the morning and at bedtime. One in the morning one in the afternoon and 2 at night.)   rivaroxaban (XARELTO) 10 MG TABS tablet Take 10 mg by mouth daily.   Semaglutide,0.25 or 0.5MG /DOS, (OZEMPIC, 0.25 OR 0.5 MG/DOSE,) 2 MG/1.5ML SOPN INJECT 0.5MG  UNDER THE SKIN EVERY WEEK   solifenacin (VESICARE) 5 MG tablet Take 1 tablet (5 mg total) by mouth daily.   SYNTHROID 50 MCG tablet TAKE 1 TABLET DAILY   vitamin B-12  (CYANOCOBALAMIN) 1000 MCG tablet Take 1,000 mcg by mouth daily.   WIXELA INHUB 100-50 MCG/ACT AEPB Inhale 1 puff into the lungs 2 (two) times daily.   Facility-Administered Encounter Medications as of 01/11/2021  Medication   cyanocobalamin ((VITAMIN B-12)) injection 1,000 mcg  Recent Relevant Labs: Lab Results  Component Value Date/Time   HGBA1C 7.0 (H) 12/06/2020 03:42 PM   HGBA1C 6.9 (H) 07/31/2020 04:19 PM   MICROALBUR 30 07/30/2018 12:32 PM    Kidney Function Lab Results  Component Value Date/Time   CREATININE 0.97 12/06/2020 03:42 PM   CREATININE 1.01 07/31/2020 04:19 PM   GFRNONAA 68 03/14/2020 02:46 PM   GFRAA 79 03/14/2020 02:46 PM    Current antihyperglycemic regimen:  Ozempic 0.5 mg weekly Jardiance 10 mg daily  What recent interventions/DTPs have been made to improve glycemic control:  Educated on Exercise goal of 150 minutes per week; Benefits of weight loss Carbohydrate counting and/or plate method Recommended that patient [facility] Counseled to check feet daily and get yearly eye exams  Have there been any recent hospitalizations or ED visits since last visit with [facility]? No  Patient denies hypoglycemic symptoms  Patient denies hyperglycemic symptoms  How often are you checking your blood sugar? once daily  What are your blood sugars ranging?  Fasting: 179, 122 Before meals: None After meals: None Bedtime: None  During the week, how often does your blood glucose drop below 70? Never  Are you checking your feet daily/regularly? Patient states daily  Adherence Review: Is the patient currently on a STATIN medication? Yes Is the patient currently on ACE/ARB medication? Yes Does the patient have >5 day gap between last estimated fill dates? No  NOTES: Patient states his blood sugars normally run in th 120's but his recent elevated reading was due to his food choices that day. Patient states he's never seen a podiatrist  and his last eye exam was last year which needs to be rescheduled.  Care Gaps: Yearly ophthalmology exam overdue Medicare wellness 12-07-2020 next 01-10-2022 Covid booster overdue Yearly foot exam overdue Flu vaccine over due last completed 03-22-2020  Star Rating Drugs: Atorvastatin 20 mg- Last filled 12-09-2020 [facility] Ozempic 0.5 mg- Last filled 10-30-2020 [facility] Losartan 50 mg- Last filled 12-27-2020 [facility] Jardiance 10 mg- Last filled 12-09-2020 [facility]  [place] Pharmacist Assistant [phone]

## 2021-01-16 DIAGNOSIS — R918 Other nonspecific abnormal finding of lung field: Secondary | ICD-10-CM | POA: Diagnosis not present

## 2021-01-16 DIAGNOSIS — R7301 Impaired fasting glucose: Secondary | ICD-10-CM | POA: Diagnosis not present

## 2021-01-16 DIAGNOSIS — I2510 Atherosclerotic heart disease of native coronary artery without angina pectoris: Secondary | ICD-10-CM | POA: Diagnosis not present

## 2021-01-16 DIAGNOSIS — R911 Solitary pulmonary nodule: Secondary | ICD-10-CM | POA: Diagnosis not present

## 2021-01-16 DIAGNOSIS — I700 Atherosclerosis of aorta: Secondary | ICD-10-CM | POA: Diagnosis not present

## 2021-01-23 DIAGNOSIS — M542 Cervicalgia: Secondary | ICD-10-CM | POA: Diagnosis not present

## 2021-01-23 DIAGNOSIS — G4719 Other hypersomnia: Secondary | ICD-10-CM | POA: Diagnosis not present

## 2021-01-23 DIAGNOSIS — G2581 Restless legs syndrome: Secondary | ICD-10-CM | POA: Diagnosis not present

## 2021-01-23 DIAGNOSIS — M545 Low back pain, unspecified: Secondary | ICD-10-CM | POA: Diagnosis not present

## 2021-01-23 DIAGNOSIS — M25551 Pain in right hip: Secondary | ICD-10-CM | POA: Diagnosis not present

## 2021-01-25 ENCOUNTER — Other Ambulatory Visit: Payer: Self-pay

## 2021-01-25 DIAGNOSIS — I480 Paroxysmal atrial fibrillation: Secondary | ICD-10-CM | POA: Diagnosis not present

## 2021-01-25 DIAGNOSIS — E782 Mixed hyperlipidemia: Secondary | ICD-10-CM | POA: Diagnosis not present

## 2021-01-25 DIAGNOSIS — I10 Essential (primary) hypertension: Secondary | ICD-10-CM | POA: Diagnosis not present

## 2021-01-25 DIAGNOSIS — I2510 Atherosclerotic heart disease of native coronary artery without angina pectoris: Secondary | ICD-10-CM | POA: Diagnosis not present

## 2021-01-25 MED ORDER — OZEMPIC (0.25 OR 0.5 MG/DOSE) 2 MG/1.5ML ~~LOC~~ SOPN: PEN_INJECTOR | SUBCUTANEOUS | 0 refills | Status: DC

## 2021-01-30 DIAGNOSIS — Z7982 Long term (current) use of aspirin: Secondary | ICD-10-CM | POA: Diagnosis not present

## 2021-01-30 DIAGNOSIS — G4733 Obstructive sleep apnea (adult) (pediatric): Secondary | ICD-10-CM | POA: Diagnosis not present

## 2021-01-30 DIAGNOSIS — Z79890 Hormone replacement therapy (postmenopausal): Secondary | ICD-10-CM | POA: Diagnosis not present

## 2021-01-30 DIAGNOSIS — Z7951 Long term (current) use of inhaled steroids: Secondary | ICD-10-CM | POA: Diagnosis not present

## 2021-01-30 DIAGNOSIS — E1169 Type 2 diabetes mellitus with other specified complication: Secondary | ICD-10-CM | POA: Diagnosis not present

## 2021-01-30 DIAGNOSIS — Z9989 Dependence on other enabling machines and devices: Secondary | ICD-10-CM | POA: Diagnosis not present

## 2021-01-30 DIAGNOSIS — Z8546 Personal history of malignant neoplasm of prostate: Secondary | ICD-10-CM | POA: Diagnosis not present

## 2021-01-30 DIAGNOSIS — R531 Weakness: Secondary | ICD-10-CM | POA: Diagnosis not present

## 2021-01-30 DIAGNOSIS — M1990 Unspecified osteoarthritis, unspecified site: Secondary | ICD-10-CM | POA: Diagnosis not present

## 2021-01-30 DIAGNOSIS — I4891 Unspecified atrial fibrillation: Secondary | ICD-10-CM | POA: Diagnosis not present

## 2021-01-30 DIAGNOSIS — J45909 Unspecified asthma, uncomplicated: Secondary | ICD-10-CM | POA: Diagnosis not present

## 2021-01-30 DIAGNOSIS — Z20822 Contact with and (suspected) exposure to covid-19: Secondary | ICD-10-CM | POA: Diagnosis not present

## 2021-01-30 DIAGNOSIS — E0789 Other specified disorders of thyroid: Secondary | ICD-10-CM | POA: Diagnosis not present

## 2021-01-30 DIAGNOSIS — E785 Hyperlipidemia, unspecified: Secondary | ICD-10-CM | POA: Diagnosis not present

## 2021-01-30 DIAGNOSIS — Z79899 Other long term (current) drug therapy: Secondary | ICD-10-CM | POA: Diagnosis not present

## 2021-01-30 DIAGNOSIS — E1165 Type 2 diabetes mellitus with hyperglycemia: Secondary | ICD-10-CM | POA: Diagnosis not present

## 2021-01-30 DIAGNOSIS — I10 Essential (primary) hypertension: Secondary | ICD-10-CM | POA: Diagnosis not present

## 2021-01-30 DIAGNOSIS — Z87891 Personal history of nicotine dependence: Secondary | ICD-10-CM | POA: Diagnosis not present

## 2021-02-01 DIAGNOSIS — G2581 Restless legs syndrome: Secondary | ICD-10-CM | POA: Diagnosis not present

## 2021-02-01 DIAGNOSIS — R918 Other nonspecific abnormal finding of lung field: Secondary | ICD-10-CM | POA: Diagnosis not present

## 2021-02-01 DIAGNOSIS — J301 Allergic rhinitis due to pollen: Secondary | ICD-10-CM | POA: Diagnosis not present

## 2021-02-01 DIAGNOSIS — G4736 Sleep related hypoventilation in conditions classified elsewhere: Secondary | ICD-10-CM | POA: Diagnosis not present

## 2021-02-01 DIAGNOSIS — J4520 Mild intermittent asthma, uncomplicated: Secondary | ICD-10-CM | POA: Diagnosis not present

## 2021-02-07 ENCOUNTER — Encounter: Payer: Medicare Other | Admitting: Nurse Practitioner

## 2021-02-07 NOTE — Patient Instructions (Signed)
Blood Glucose Monitoring, Adult Monitoring your blood sugar (glucose) is an important part of managing your diabetes. Blood glucose monitoring involves checking your blood glucose as often as directed and keeping a log or record of your results over time. Checking your blood glucose regularly and keeping a blood glucose log can: Help you and your health care provider adjust your diabetes management plan as needed, including your medicines or insulin. Help you understand how food, exercise, illnesses, and medicines affect your blood glucose. Let you know what your blood glucose is at any time. You can quickly find out if you have low blood glucose (hypoglycemia) or high blood glucose (hyperglycemia). Your health care provider will set individualized treatment goals for you. Your goals will be based on your age, other medical conditions you have, and how you respond to diabetes treatment. Generally, the goal of treatment is to maintain the following blood glucose levels: Before meals (preprandial): 80-130 mg/dL (4.4-7.2 mmol/L). After meals (postprandial): below 180 mg/dL (10 mmol/L). A1C level: less than 7%. Supplies needed: Blood glucose meter. Test strips for your meter. Each meter has its own strips. You must use the strips that came with your meter. A needle to prick your finger (lancet). Do not use a lancet more than one time. A device that holds the lancet (lancing device). A journal or log book to write down your results. How to check your blood glucose Checking your blood glucose  Wash your hands for at least 20 seconds with soap and water. Prick the side of your finger (not the tip) with the lancet. Do not use the same finger consecutively. Gently rub the finger until a small drop of blood appears. Follow instructions that come with your meter for inserting the test strip, applying blood to the strip, and using your blood glucose meter. Write down your result and any notes in your  log. Using alternative sites Some meters allow you to use areas of your body other than your finger (alternative sites) to test your blood. The most common alternative sites are the forearm, the thigh, and the palm of your hand. Alternative sites may not be as accurate as the fingers because blood flow is slower in those areas. This means that the result you get may be delayed, and it may be different from the result that you would get from your finger. Use the finger only, and do not use alternative sites, if: You think you have hypoglycemia. You sometimes do not know that your blood glucose is getting low (hypoglycemia unawareness). General tips and recommendations Blood glucose log  Every time you check your blood glucose, write down your result. Also write down any notes about things that may be affecting your blood glucose, such as your diet and exercise for the day. This information can help you and your health care provider: Look for patterns in your blood glucose over time. Adjust your diabetes management plan as needed. Check if your meter allows you to download your records to a computer or if there is an app for the meter. Most glucose meters store a record of glucose readings in the meter. If you have type 1 diabetes: Check your blood glucose 4 or more times a day if you are on intensive insulin therapy with multiple daily injections (MDI) or if you are using an insulin pump. Check your blood glucose: Before every meal and snack. Before bedtime. Also check your blood glucose: If you have symptoms of hypoglycemia. After treating low blood glucose.  Before doing activities that create a risk for injury, like driving or using machinery. Before and after exercise. Two hours after a meal. Occasionally between 2:00 a.m. and 3:00 a.m., as directed. You may need to check your blood glucose more often, 6-10 times per day, if: You have diabetes that is not well controlled. You are  ill. You have a history of severe hypoglycemia. You have hypoglycemia unawareness. If you have type 2 diabetes: Check your blood glucose 2 or more times a day if you take insulin or other diabetes medicines. Check your blood glucose 4 or more times a day if you are on intensive insulin therapy. Occasionally, you may also need to check your glucose between 2:00 a.m. and 3:00 a.m., as directed. Also check your blood glucose: Before and after exercise. Before doing activities that create a risk for injury, like driving or using machinery. You may need to check your blood glucose more often if: Your medicine is being adjusted. Your diabetes is not well controlled. You are ill. General tips Make sure you always have your supplies with you. After you use a few boxes of test strips, adjust (calibrate) your blood glucose meter by following instructions that came with your meter. If you have questions or need help, all blood glucose meters have a 24-hour hotline phone number available that you can call. Also contact your health care provider with questions or concerns you may have. Where to find more information The American Diabetes Association: www.diabetes.org The Association of Diabetes Care & Education Specialists: www.diabeteseducator.org Contact a health care provider if: Your blood glucose is at or above 240 mg/dL (13.3 mmol/L) for 2 days in a row. You have been sick or have had a fever for 2 days or longer, and you are not getting better. You have any of the following problems for more than 6 hours: You cannot eat or drink. You have nausea or vomiting. You have diarrhea. Get help right away if: Your blood glucose is lower than 54 mg/dL (3 mmol/L). You become confused, or you have trouble thinking clearly. You have difficulty breathing. You have moderate or large ketone levels in your urine. These symptoms may represent a serious problem that is an emergency. Do not wait to see if the  symptoms will go away. Get medical help right away. Call your local emergency services (911 in the [place] Do not drive yourself to the hospital. Summary Monitoring your blood glucose is an important part of managing your diabetes. Blood glucose monitoring involves checking your blood glucose as often as directed and keeping a log or record of your results over time. Your health care provider will set individualized treatment goals for you. Your goals will be based on your age, other medical conditions you have, and how you respond to diabetes treatment. Every time you check your blood glucose, write down your result. Also, write down any notes about things that may be affecting your blood glucose, such as your diet and exercise for the day. This information is not intended to replace advice given to you by your health care provider. Make sure you discuss any questions you have with your health care provider. Document Revised: 01/05/2020 Document Reviewed: 01/05/2020 Elsevier Patient Education  2022 [employer].

## 2021-02-07 NOTE — Progress Notes (Signed)
Not seen

## 2021-02-08 ENCOUNTER — Ambulatory Visit (INDEPENDENT_AMBULATORY_CARE_PROVIDER_SITE_OTHER): Payer: Medicare Other | Admitting: Nurse Practitioner

## 2021-02-08 ENCOUNTER — Encounter: Payer: Self-pay | Admitting: Nurse Practitioner

## 2021-02-08 ENCOUNTER — Other Ambulatory Visit: Payer: Self-pay

## 2021-02-08 VITALS — BP 114/72 | HR 85 | Temp 98.5°F | Ht 70.0 in | Wt 225.6 lb

## 2021-02-08 DIAGNOSIS — N182 Chronic kidney disease, stage 2 (mild): Secondary | ICD-10-CM | POA: Diagnosis not present

## 2021-02-08 DIAGNOSIS — E1122 Type 2 diabetes mellitus with diabetic chronic kidney disease: Secondary | ICD-10-CM

## 2021-02-08 DIAGNOSIS — R350 Frequency of micturition: Secondary | ICD-10-CM | POA: Diagnosis not present

## 2021-02-08 DIAGNOSIS — Z23 Encounter for immunization: Secondary | ICD-10-CM

## 2021-02-08 DIAGNOSIS — E1165 Type 2 diabetes mellitus with hyperglycemia: Secondary | ICD-10-CM

## 2021-02-08 DIAGNOSIS — D649 Anemia, unspecified: Secondary | ICD-10-CM | POA: Diagnosis not present

## 2021-02-08 DIAGNOSIS — R531 Weakness: Secondary | ICD-10-CM | POA: Diagnosis not present

## 2021-02-08 DIAGNOSIS — I1310 Hypertensive heart and chronic kidney disease without heart failure, with stage 1 through stage 4 chronic kidney disease, or unspecified chronic kidney disease: Secondary | ICD-10-CM

## 2021-02-08 MED ORDER — OZEMPIC (1 MG/DOSE) 4 MG/3ML ~~LOC~~ SOPN: 1.0000 mg | PEN_INJECTOR | SUBCUTANEOUS | 1 refills | Status: DC

## 2021-02-08 NOTE — Patient Instructions (Signed)
Diabetes Mellitus and Nutrition, Adult When you have diabetes, or diabetes mellitus, it is very important to have healthy eating habits because your blood sugar (glucose) levels are greatly affected by what you eat and drink. Eating healthy foods in the right amounts, at about the same times every day, can help you:  Control your blood glucose.  Lower your risk of heart disease.  Improve your blood pressure.  Reach or maintain a healthy weight. What can affect my meal plan? Every person with diabetes is different, and each person has different needs for a meal plan. Your health care provider may recommend that you work with a dietitian to make a meal plan that is best for you. Your meal plan may vary depending on factors such as:  The calories you need.  The medicines you take.  Your weight.  Your blood glucose, blood pressure, and cholesterol levels.  Your activity level.  Other health conditions you have, such as heart or kidney disease. How do carbohydrates affect me? Carbohydrates, also called carbs, affect your blood glucose level more than any other type of food. Eating carbs naturally raises the amount of glucose in your blood. Carb counting is a method for keeping track of how many carbs you eat. Counting carbs is important to keep your blood glucose at a healthy level, especially if you use insulin or take certain oral diabetes medicines. It is important to know how many carbs you can safely have in each meal. This is different for every person. Your dietitian can help you calculate how many carbs you should have at each meal and for each snack. How does alcohol affect me? Alcohol can cause a sudden decrease in blood glucose (hypoglycemia), especially if you use insulin or take certain oral diabetes medicines. Hypoglycemia can be a life-threatening condition. Symptoms of hypoglycemia, such as sleepiness, dizziness, and confusion, are similar to symptoms of having too much  alcohol.  Do not drink alcohol if: ? Your health care provider tells you not to drink. ? You are pregnant, may be pregnant, or are planning to become pregnant.  If you drink alcohol: ? Do not drink on an empty stomach. ? Limit how much you use to:  0-1 drink a day for women.  0-2 drinks a day for men. ? Be aware of how much alcohol is in your drink. In the [place] one drink equals one 12 oz bottle of beer (355 mL), one 5 oz glass of wine (148 mL), or one 1 oz glass of hard liquor (44 mL). ? Keep yourself hydrated with water, diet soda, or unsweetened iced tea.  Keep in mind that regular soda, juice, and other mixers may contain a lot of sugar and must be counted as carbs. What are tips for following this plan? Reading food labels  Start by checking the serving size on the "Nutrition Facts" label of packaged foods and drinks. The amount of calories, carbs, fats, and other nutrients listed on the label is based on one serving of the item. Many items contain more than one serving per package.  Check the total grams (g) of carbs in one serving. You can calculate the number of servings of carbs in one serving by dividing the total carbs by 15. For example, if a food has 30 g of total carbs per serving, it would be equal to 2 servings of carbs.  Check the number of grams (g) of saturated fats and trans fats in one serving. Choose foods that have   a low amount or none of these fats.  Check the number of milligrams (mg) of salt (sodium) in one serving. Most people should limit total sodium intake to less than 2,300 mg per day.  Always check the nutrition information of foods labeled as "low-fat" or "nonfat." These foods may be higher in added sugar or refined carbs and should be avoided.  Talk to your dietitian to identify your daily goals for nutrients listed on the label. Shopping  Avoid buying canned, pre-made, or processed foods. These foods tend to be high in fat, sodium, and added  sugar.  Shop around the outside edge of the grocery store. This is where you will most often find fresh fruits and vegetables, bulk grains, fresh meats, and fresh dairy. Cooking  Use low-heat cooking methods, such as baking, instead of high-heat cooking methods like deep frying.  Cook using healthy oils, such as olive, canola, or sunflower oil.  Avoid cooking with butter, cream, or high-fat meats. Meal planning  Eat meals and snacks regularly, preferably at the same times every day. Avoid going long periods of time without eating.  Eat foods that are high in fiber, such as fresh fruits, vegetables, beans, and whole grains. Talk with your dietitian about how many servings of carbs you can eat at each meal.  Eat 4-6 oz (112-168 g) of lean protein each day, such as lean meat, chicken, fish, eggs, or tofu. One ounce (oz) of lean protein is equal to: ? 1 oz (28 g) of meat, chicken, or fish. ? 1 egg. ?  cup (62 g) of tofu.  Eat some foods each day that contain healthy fats, such as avocado, nuts, seeds, and fish.   What foods should I eat? Fruits Berries. Apples. Oranges. Peaches. Apricots. Plums. Grapes. Mango. Papaya. Pomegranate. Kiwi. Cherries. Vegetables Lettuce. Spinach. Leafy greens, including kale, chard, collard greens, and mustard greens. Beets. Cauliflower. Cabbage. Broccoli. Carrots. Green beans. Tomatoes. Peppers. Onions. Cucumbers. Brussels sprouts. Grains Whole grains, such as whole-wheat or whole-grain bread, crackers, tortillas, cereal, and pasta. Unsweetened oatmeal. Quinoa. Brown or wild rice. Meats and other proteins Seafood. Poultry without skin. Lean cuts of poultry and beef. Tofu. Nuts. Seeds. Dairy Low-fat or fat-free dairy products such as milk, yogurt, and cheese. The items listed above may not be a complete list of foods and beverages you can eat. Contact a dietitian for more information. What foods should I avoid? Fruits Fruits canned with  syrup. Vegetables Canned vegetables. Frozen vegetables with butter or cream sauce. Grains Refined white flour and flour products such as bread, pasta, snack foods, and cereals. Avoid all processed foods. Meats and other proteins Fatty cuts of meat. Poultry with skin. Breaded or fried meats. Processed meat. Avoid saturated fats. Dairy Full-fat yogurt, cheese, or milk. Beverages Sweetened drinks, such as soda or iced tea. The items listed above may not be a complete list of foods and beverages you should avoid. Contact a dietitian for more information. Questions to ask a health care provider  Do I need to meet with a diabetes educator?  Do I need to meet with a dietitian?  What number can I call if I have questions?  When are the best times to check my blood glucose? Where to find more information:  American Diabetes Association: diabetes.org  Academy of Nutrition and Dietetics: www.eatright.org  National Institute of Diabetes and Digestive and Kidney Diseases: www.niddk.nih.gov  Association of Diabetes Care and Education Specialists: www.diabeteseducator.org Summary  It is important to have healthy eating   habits because your blood sugar (glucose) levels are greatly affected by what you eat and drink.  A healthy meal plan will help you control your blood glucose and maintain a healthy lifestyle.  Your health care provider may recommend that you work with a dietitian to make a meal plan that is best for you.  Keep in mind that carbohydrates (carbs) and alcohol have immediate effects on your blood glucose levels. It is important to count carbs and to use alcohol carefully. This information is not intended to replace advice given to you by your health care provider. Make sure you discuss any questions you have with your health care provider. Document Revised: 03/16/2019 Document Reviewed: 03/16/2019 Elsevier Patient Education  2021 [employer]  

## 2021-02-08 NOTE — Progress Notes (Signed)
I, Franklin Thornton,acting as a Education administrator for [employer], FNP.,have documented all relevant documentation on the behalf of Franklin Brine, FNP,as directed by  Franklin Brine, FNP while in the presence of Franklin Thornton, [place].  This visit occurred during the SARS-CoV-2 public health emergency.  Safety protocols were in place, including screening questions prior to the visit, additional usage of staff PPE, and extensive cleaning of exam room while observing appropriate contact time as indicated for disinfecting solutions.  Subjective:     Patient ID: Franklin Thornton , male    DOB: 03/27/1946 , 75 y.o.   MRN: [id number]   Chief Complaint  Patient presents with   Hyperglycemia    HPI  Pt presents for ED f/u at [facility]. He had elevated blood sugars was given insulin and IV fluids. He does admit to eating poorly but his blood sugar had not ever been as high as 496.  He had been weak. He has fallen 3 times two days ago.  He is eating okay. He is cutting back on his sugar. Her blood sugar this morning was 479 in office he had a bagel prior to coming to office.    He is complaining of having leg weakness for the last weak. Began the day his blood sugar was elevated.   Reports he is urinating 4-5 times an hour started around the same time as a high sugar. He had prostate surgery for prostate cancer 10 years ago - he has an appt with a Dealer in [employer]) in the upcoming weeks.   He is also seeing a neurologist in [place] for his due to not being able to stay awake during the day.  He is currently taking adderall.   His pulomonologist is monitoring a spot on his left lung that has increased in size from 23m to 143m does not think it is cancer.   Wt Readings from Last 3 Encounters: 02/08/21 : 225 lb 9.6 oz (102.3 kg) 12/07/20 : 244 lb (110.7 kg) 12/06/20 : 244 lb (110.7 kg)  He is trying to not eat as much. His last colonoscopy was a couple years.    Hyperglycemia This is a  recurrent problem. The current episode started more than 1 year ago. The problem has been gradually improving. Associated symptoms include weakness. Pertinent negatives include no abdominal pain, chills, numbness or vertigo. Nothing aggravates the symptoms.    Past Medical History:  Diagnosis Date   Atrial fibrillation ([facility]   Diabetes mellitus without complication ([facility]   Peripheral vascular disease ([facility]   Prostate cancer ([facility]   Uvular edema 07/17/2015     Family History  Problem Relation Age of Onset   Lung cancer Mother    Diabetes Mother    Esophageal cancer Father    Hypertension Sister    Breast cancer Sister    Healthy Son    Healthy Daughter      Current Outpatient Medications:    albuterol (PROVENTIL HFA;VENTOLIN HFA) 108 (90 Base) MCG/ACT inhaler, Inhale 2 puffs into the lungs every 6 (six) hours as needed for wheezing or shortness of breath., Disp: , Rfl:    albuterol (PROVENTIL) (2.5 MG/3ML) 0.083% nebulizer solution, Take 3 mLs (2.5 mg total) by nebulization every 6 (six) hours as needed for wheezing or shortness of breath., Disp: 75 mL, Rfl: 3   amiodarone (PACERONE) 100 MG tablet, Take 100 mg by mouth daily. , Disp: , Rfl:    Armodafinil 250 MG tablet,  Take 125-250 mg by mouth every morning., Disp: , Rfl:    aspirin 81 MG tablet, Take 81 mg by mouth daily., Disp: , Rfl:    atorvastatin (LIPITOR) 20 MG tablet, Take 1 tablet (20 mg total) by mouth daily., Disp: 90 tablet, Rfl: 1   b complex vitamins capsule, Take 1 capsule by mouth daily., Disp: , Rfl:    benzonatate (TESSALON PERLES) 100 MG capsule, Take 1 capsule (100 mg total) by mouth 3 (three) times daily as needed for cough., Disp: 30 capsule, Rfl: 0   carvedilol (COREG) 6.25 MG tablet, Take 6.25 mg by mouth 2 (two) times daily with a meal., Disp: , Rfl:    Cholecalciferol (VITAMIN D) 2000 units tablet, Take 2,000 Units by mouth daily., Disp: , Rfl:    diphenhydrAMINE (BENADRYL) 25 MG tablet, Take 25 mg by  mouth every 6 (six) hours as needed for itching. , Disp: , Rfl:    donepezil (ARICEPT) 10 MG tablet, Take 1 tablet (10 mg total) by mouth at bedtime., Disp: 90 tablet, Rfl: 3   DULoxetine (CYMBALTA) 30 MG capsule, TAKE 1 CAPSULE DAILY IN THE EVENING, Disp: 90 capsule, Rfl: 3   furosemide (LASIX) 20 MG tablet, Take 20 mg by mouth daily as needed. , Disp: , Rfl:    gabapentin (NEURONTIN) 600 MG tablet, TAKE 1 TABLET THREE TIMES A DAY, Disp: 270 tablet, Rfl: 3   glucose monitoring kit (FREESTYLE) monitoring kit, 1 each by Does not apply route as needed for other., Disp: , Rfl:    HYDROcodone-acetaminophen (NORCO/VICODIN) 5-325 MG tablet, Take 1 tablet by mouth every 6 (six) hours as needed for moderate pain., Disp: , Rfl:    ipratropium (ATROVENT) 0.03 % nasal spray, Place 2 sprays into both nostrils every 12 (twelve) hours., Disp: , Rfl:    JARDIANCE 10 MG TABS tablet, TAKE 1 TABLET DAILY, Disp: 90 tablet, Rfl: 3   levocetirizine (XYZAL) 5 MG tablet, Take 5 mg by mouth every evening. , Disp: , Rfl:    lidocaine (LIDODERM) 5 %, Place 3 patches onto the skin daily. May use 1-3 patches at one time, Remove & Discard patch within 12 hours or as directed by MD, Disp: 90 patch, Rfl: 0   losartan (COZAAR) 50 MG tablet, Take 50 mg by mouth 2 (two) times daily. , Disp: , Rfl:    Magnesium 250 MG TABS, Take 1 tablet by mouth daily., Disp: , Rfl:    methocarbamol (ROBAXIN) 750 MG tablet, Take 750 mg by mouth 3 (three) times daily as needed for muscle spasms. , Disp: , Rfl:    mirabegron ER (MYRBETRIQ) 50 MG TB24 tablet, Take 50 mg by mouth daily., Disp: , Rfl:    montelukast (SINGULAIR) 10 MG tablet, Take 10 mg by mouth at bedtime., Disp: , Rfl:    Multiple Vitamin (MULTIVITAMIN) tablet, Take 1 tablet by mouth daily., Disp: , Rfl:    nitroGLYCERIN (NITROSTAT) 0.4 MG SL tablet, Place 0.4 mg under the tongue every 5 (five) minutes as needed for chest pain. , Disp: , Rfl:    Omega-3 Fatty Acids (FISH OIL) 1200 MG  CAPS, Take 1,200 mg by mouth. , Disp: , Rfl:    pantoprazole (PROTONIX) 40 MG tablet, Take 1 tablet (40 mg total) by mouth daily., Disp: 90 tablet, Rfl: 1   pramipexole (MIRAPEX) 0.5 MG tablet, Take 2 tablets (1 mg total) by mouth in the morning and at bedtime. (Patient taking differently: Take 1 mg by mouth in the morning  and at bedtime. One in the morning one in the afternoon and 2 at night.), Disp: 90 tablet, Rfl: 1   rivaroxaban (XARELTO) 10 MG TABS tablet, Take 10 mg by mouth daily., Disp: , Rfl:    Semaglutide, 1 MG/DOSE, (OZEMPIC, 1 MG/DOSE,) 4 MG/3ML SOPN, Inject 1 mg into the skin once a week., Disp: 9 mL, Rfl: 1   solifenacin (VESICARE) 5 MG tablet, Take 1 tablet (5 mg total) by mouth daily., Disp: 90 tablet, Rfl: 1   SYNTHROID 50 MCG tablet, TAKE 1 TABLET DAILY, Disp: 90 tablet, Rfl: 3   vitamin B-12 (CYANOCOBALAMIN) 1000 MCG tablet, Take 1,000 mcg by mouth daily., Disp: , Rfl:    WIXELA INHUB 100-50 MCG/ACT AEPB, Inhale 1 puff into the lungs 2 (two) times daily., Disp: 60 each, Rfl: 1  Current Facility-Administered Medications:    cyanocobalamin ((VITAMIN B-12)) injection 1,000 mcg, 1,000 mcg, Intramuscular, Once, [name], MD   Allergies  Allergen Reactions   No Known Allergies      Review of Systems  Constitutional: Negative.  Negative for chills.  HENT: Negative.    Gastrointestinal: Negative.  Negative for abdominal pain.  Endocrine: Negative.   Skin: Negative.   Allergic/Immunologic: Negative.   Neurological:  Positive for weakness. Negative for vertigo and numbness.  Psychiatric/Behavioral: Negative.      Today's Vitals   02/08/21 1010  BP: 114/72  Pulse: 85  Temp: 98.5 F (36.9 C)  Weight: 225 lb 9.6 oz (102.3 kg)  Height: _0  (1.778 m)  PainSc: 5   PainLoc: Hand   Body mass index is 32.37 kg/m.   Objective:  Physical Exam Vitals reviewed.  Constitutional:      General: He is not in acute distress.    Appearance: Normal appearance. He is  obese.  HENT:     Head: Normocephalic.     Right Ear: There is no impacted cerumen.  Eyes:     Extraocular Movements: Extraocular movements intact.     Conjunctiva/sclera: Conjunctivae normal.     Pupils: Pupils are equal, round, and reactive to light.  Cardiovascular:     Rate and Rhythm: Normal rate and regular rhythm.     Pulses: Normal pulses.     Heart sounds: Normal heart sounds. No murmur heard. Pulmonary:     Effort: Pulmonary effort is normal. No respiratory distress.     Breath sounds: Normal breath sounds. No wheezing.  Skin:    General: Skin is warm and dry.     Capillary Refill: Capillary refill takes less than 2 seconds.  Neurological:     General: No focal deficit present.     Mental Status: He is alert and oriented to person, place, and time.     Cranial Nerves: No cranial nerve deficit.  Psychiatric:        Mood and Affect: Mood normal.        Behavior: Behavior normal.        Thought Content: Thought content normal.        Judgment: Judgment normal.        Assessment And Plan:     1. Type 2 diabetes mellitus with stage 2 chronic kidney disease, without long-term current use of insulin (HCC) - Semaglutide, 1 MG/DOSE, (OZEMPIC, 1 MG/DOSE,) 4 MG/3ML SOPN; Inject 1 mg into the skin once a week.  Dispense: 9 mL; Refill: 1  2. Hyperglycemia due to diabetes mellitus ([place])  3. Hypertensive heart and renal disease with renal failure, stage 1 through  stage 4 or unspecified chronic kidney disease, without heart failure  4. Need for vaccination Influenza vaccine administered Encouraged to take Tylenol as needed for fever or muscle aches. - Flu Vaccine QUAD High Dose(Fluad)  5. Urinary frequency Will check PSA, I have also stressed to him to contact his Urologist - PSA  6. Generalized weakness Will check iron studies and set up for physical therapy He is having episodes of falling since being at the hospital - Iron, TIBC and Ferritin Panel - TSH - Ambulatory  referral to Physical Therapy     Patient was given opportunity to ask questions. Patient verbalized understanding of the plan and was able to repeat key elements of the plan. All questions were answered to their satisfaction.  Franklin Brine, FNP   I, Franklin Brine, FNP, have reviewed all documentation for this visit. The documentation on 02/08/21 for the exam, diagnosis, procedures, and orders are all accurate and complete.   IF YOU HAVE BEEN REFERRED TO A SPECIALIST, IT MAY TAKE 1-2 WEEKS TO SCHEDULE/PROCESS THE REFERRAL. IF YOU HAVE NOT HEARD FROM US/SPECIALIST IN TWO WEEKS, PLEASE GIVE [place] A CALL AT [phone].   THE PATIENT IS ENCOURAGED TO PRACTICE SOCIAL DISTANCING DUE TO THE COVID-19 PANDEMIC.

## 2021-02-09 LAB — IRON,TIBC AND FERRITIN PANEL
Ferritin: 611 ng/mL — ABNORMAL HIGH (ref 30–400)
Iron Saturation: 41 % (ref 15–55)
Iron: 125 ug/dL (ref 38–169)
Total Iron Binding Capacity: 304 ug/dL (ref 250–450)
UIBC: 179 ug/dL (ref 111–343)

## 2021-02-09 LAB — TSH: TSH: 0.426 u[IU]/mL — ABNORMAL LOW (ref 0.450–4.500)

## 2021-02-09 LAB — PSA: Prostate Specific Ag, Serum: <0.1 ng/mL (ref 0.0–4.0)

## 2021-02-12 ENCOUNTER — Other Ambulatory Visit: Payer: Self-pay | Admitting: Internal Medicine

## 2021-02-15 ENCOUNTER — Other Ambulatory Visit: Payer: Self-pay

## 2021-02-15 ENCOUNTER — Ambulatory Visit: Payer: Medicare Other | Admitting: Nurse Practitioner

## 2021-02-16 DIAGNOSIS — N3941 Urge incontinence: Secondary | ICD-10-CM | POA: Diagnosis not present

## 2021-02-16 DIAGNOSIS — R350 Frequency of micturition: Secondary | ICD-10-CM | POA: Diagnosis not present

## 2021-02-16 DIAGNOSIS — R3915 Urgency of urination: Secondary | ICD-10-CM | POA: Diagnosis not present

## 2021-02-16 DIAGNOSIS — N4883 Acquired buried penis: Secondary | ICD-10-CM | POA: Diagnosis not present

## 2021-02-16 DIAGNOSIS — Z8546 Personal history of malignant neoplasm of prostate: Secondary | ICD-10-CM | POA: Diagnosis not present

## 2021-02-16 DIAGNOSIS — R351 Nocturia: Secondary | ICD-10-CM | POA: Diagnosis not present

## 2021-02-16 DIAGNOSIS — R739 Hyperglycemia, unspecified: Secondary | ICD-10-CM | POA: Diagnosis not present

## 2021-02-19 ENCOUNTER — Other Ambulatory Visit: Payer: Self-pay | Admitting: Internal Medicine

## 2021-02-23 DIAGNOSIS — J3489 Other specified disorders of nose and nasal sinuses: Secondary | ICD-10-CM | POA: Diagnosis not present

## 2021-02-23 DIAGNOSIS — R059 Cough, unspecified: Secondary | ICD-10-CM | POA: Diagnosis not present

## 2021-02-23 DIAGNOSIS — R531 Weakness: Secondary | ICD-10-CM | POA: Diagnosis not present

## 2021-02-23 DIAGNOSIS — R0602 Shortness of breath: Secondary | ICD-10-CM | POA: Diagnosis not present

## 2021-02-23 DIAGNOSIS — R739 Hyperglycemia, unspecified: Secondary | ICD-10-CM | POA: Diagnosis not present

## 2021-02-26 ENCOUNTER — Telehealth: Payer: Self-pay

## 2021-02-26 NOTE — Telephone Encounter (Signed)
Left pt vm to give the office a call back. Called to follow up with pt after visiting the ED.

## 2021-02-28 NOTE — Telephone Encounter (Signed)
Error

## 2021-03-06 DIAGNOSIS — M542 Cervicalgia: Secondary | ICD-10-CM | POA: Diagnosis not present

## 2021-03-06 DIAGNOSIS — R296 Repeated falls: Secondary | ICD-10-CM | POA: Diagnosis not present

## 2021-03-06 DIAGNOSIS — M545 Low back pain, unspecified: Secondary | ICD-10-CM | POA: Diagnosis not present

## 2021-03-06 DIAGNOSIS — Z79899 Other long term (current) drug therapy: Secondary | ICD-10-CM | POA: Diagnosis not present

## 2021-03-06 DIAGNOSIS — G603 Idiopathic progressive neuropathy: Secondary | ICD-10-CM | POA: Diagnosis not present

## 2021-03-07 ENCOUNTER — Ambulatory Visit: Payer: Medicare Other | Admitting: Nurse Practitioner

## 2021-03-08 ENCOUNTER — Other Ambulatory Visit: Payer: Self-pay

## 2021-03-08 ENCOUNTER — Encounter: Payer: Self-pay | Admitting: Internal Medicine

## 2021-03-08 ENCOUNTER — Ambulatory Visit (INDEPENDENT_AMBULATORY_CARE_PROVIDER_SITE_OTHER): Payer: Medicare Other | Admitting: Internal Medicine

## 2021-03-08 VITALS — BP 124/80 | HR 67 | Temp 98.1°F | Ht 70.0 in | Wt 222.6 lb

## 2021-03-08 DIAGNOSIS — R296 Repeated falls: Secondary | ICD-10-CM | POA: Diagnosis not present

## 2021-03-08 DIAGNOSIS — R2689 Other abnormalities of gait and mobility: Secondary | ICD-10-CM | POA: Diagnosis not present

## 2021-03-08 DIAGNOSIS — R0982 Postnasal drip: Secondary | ICD-10-CM | POA: Diagnosis not present

## 2021-03-08 DIAGNOSIS — Z6831 Body mass index (BMI) 31.0-31.9, adult: Secondary | ICD-10-CM | POA: Diagnosis not present

## 2021-03-08 DIAGNOSIS — E6609 Other obesity due to excess calories: Secondary | ICD-10-CM | POA: Diagnosis not present

## 2021-03-08 DIAGNOSIS — E1122 Type 2 diabetes mellitus with diabetic chronic kidney disease: Secondary | ICD-10-CM

## 2021-03-08 DIAGNOSIS — I1310 Hypertensive heart and chronic kidney disease without heart failure, with stage 1 through stage 4 chronic kidney disease, or unspecified chronic kidney disease: Secondary | ICD-10-CM

## 2021-03-08 DIAGNOSIS — N182 Chronic kidney disease, stage 2 (mild): Secondary | ICD-10-CM | POA: Diagnosis not present

## 2021-03-08 MED ORDER — AZELASTINE HCL 0.1 % NA SOLN: 1.0000 | Freq: Two times a day (BID) | NASAL | 12 refills | Status: DC

## 2021-03-08 NOTE — Progress Notes (Signed)
[name],acting as a Education administrator for Franklin Greenland, MD.,have documented all relevant documentation on the behalf of Franklin Greenland, MD,as directed by  Franklin Greenland, MD while in the presence of Franklin Greenland, MD.  This visit occurred during the SARS-CoV-2 public health emergency.  Safety protocols were in place, including screening questions prior to the visit, additional usage of staff PPE, and extensive cleaning of exam room while observing appropriate contact time as indicated for disinfecting solutions.  Subjective:     Patient ID: Franklin Thornton , male    DOB: 07/13/1945 , 75 y.o.   MRN: [id number]   Chief Complaint  Patient presents with   Hypertension   ER f/u    HPI  Patient presents today for a ER f/u. He stated he did not get seen in the ER he waited for 7 hours and then left. He went to [facility] ER on 11/4 for further evaluation of elevated glucose. States he was unable to get his sugars below 200. Additionally, he states he had cough/cold sx as well. He denies fever/chills.     Past Medical History:  Diagnosis Date   Atrial fibrillation ([place])    Diabetes mellitus without complication ([place])    Peripheral vascular disease ([place])    Prostate cancer ([place])    Uvular edema 07/17/2015     Family History  Problem Relation Age of Onset   Lung cancer Mother    Diabetes Mother    Esophageal cancer Father    Hypertension Sister    Breast cancer Sister    Healthy Son    Healthy Daughter      Current Outpatient Medications:    albuterol (PROVENTIL HFA;VENTOLIN HFA) 108 (90 Base) MCG/ACT inhaler, Inhale 2 puffs into the lungs every 6 (six) hours as needed for wheezing or shortness of breath., Disp: , Rfl:    albuterol (PROVENTIL) (2.5 MG/3ML) 0.083% nebulizer solution, Take 3 mLs (2.5 mg total) by nebulization every 6 (six) hours as needed for wheezing or shortness of breath., Disp: 75 mL, Rfl: 3   amiodarone (PACERONE) 100 MG tablet, Take 100 mg by mouth  daily. , Disp: , Rfl:    amphetamine-dextroamphetamine (ADDERALL) 10 MG tablet, Take 1 tablet by mouth daily. Take 2 tablets in the morning and one tablet in the evenings., Disp: , Rfl:    Armodafinil 250 MG tablet, Take 125-250 mg by mouth every morning., Disp: , Rfl:    aspirin 81 MG tablet, Take 81 mg by mouth daily., Disp: , Rfl:    atorvastatin (LIPITOR) 20 MG tablet, TAKE 1 TABLET DAILY, Disp: 90 tablet, Rfl: 3   azelastine (ASTELIN) 0.1 % nasal spray, Place 1 spray into both nostrils 2 (two) times daily. Use in each nostril as directed, Disp: 30 mL, Rfl: 12   b complex vitamins capsule, Take 1 capsule by mouth daily., Disp: , Rfl:    benzonatate (TESSALON PERLES) 100 MG capsule, Take 1 capsule (100 mg total) by mouth 3 (three) times daily as needed for cough., Disp: 30 capsule, Rfl: 0   carvedilol (COREG) 6.25 MG tablet, Take 6.25 mg by mouth 2 (two) times daily with a meal., Disp: , Rfl:    Cholecalciferol (VITAMIN D) 2000 units tablet, Take 2,000 Units by mouth daily., Disp: , Rfl:    diphenhydrAMINE (BENADRYL) 25 MG tablet, Take 25 mg by mouth every 6 (six) hours as needed for itching. , Disp: , Rfl:    donepezil (ARICEPT) 10 MG tablet, Take  1 tablet (10 mg total) by mouth at bedtime., Disp: 90 tablet, Rfl: 3   DULoxetine (CYMBALTA) 30 MG capsule, TAKE 1 CAPSULE DAILY IN THE EVENING, Disp: 90 capsule, Rfl: 3   furosemide (LASIX) 20 MG tablet, Take 20 mg by mouth daily as needed. , Disp: , Rfl:    gabapentin (NEURONTIN) 600 MG tablet, TAKE 1 TABLET THREE TIMES A DAY, Disp: 270 tablet, Rfl: 3   glucose monitoring kit (FREESTYLE) monitoring kit, 1 each by Does not apply route as needed for other., Disp: , Rfl:    HYDROcodone-acetaminophen (NORCO/VICODIN) 5-325 MG tablet, Take 1 tablet by mouth every 6 (six) hours as needed for moderate pain., Disp: , Rfl:    ipratropium (ATROVENT) 0.03 % nasal spray, Place 2 sprays into both nostrils every 12 (twelve) hours., Disp: , Rfl:    JARDIANCE 10 MG  TABS tablet, TAKE 1 TABLET DAILY, Disp: 90 tablet, Rfl: 3   levocetirizine (XYZAL) 5 MG tablet, Take 5 mg by mouth every evening. , Disp: , Rfl:    lidocaine (LIDODERM) 5 %, Place 3 patches onto the skin daily. May use 1-3 patches at one time, Remove & Discard patch within 12 hours or as directed by MD, Disp: 90 patch, Rfl: 0   losartan (COZAAR) 50 MG tablet, Take 50 mg by mouth 2 (two) times daily. , Disp: , Rfl:    Magnesium 250 MG TABS, Take 1 tablet by mouth daily., Disp: , Rfl:    methocarbamol (ROBAXIN) 750 MG tablet, Take 750 mg by mouth 3 (three) times daily as needed for muscle spasms. , Disp: , Rfl:    mirabegron ER (MYRBETRIQ) 50 MG TB24 tablet, Take 50 mg by mouth daily., Disp: , Rfl:    montelukast (SINGULAIR) 10 MG tablet, Take 10 mg by mouth at bedtime., Disp: , Rfl:    Multiple Vitamin (MULTIVITAMIN) tablet, Take 1 tablet by mouth daily., Disp: , Rfl:    nitroGLYCERIN (NITROSTAT) 0.4 MG SL tablet, Place 0.4 mg under the tongue every 5 (five) minutes as needed for chest pain. , Disp: , Rfl:    Omega-3 Fatty Acids (FISH OIL) 1200 MG CAPS, Take 1,200 mg by mouth. , Disp: , Rfl:    pantoprazole (PROTONIX) 40 MG tablet, TAKE 1 TABLET DAILY, Disp: 90 tablet, Rfl: 3   pramipexole (MIRAPEX) 0.5 MG tablet, Take 2 tablets (1 mg total) by mouth in the morning and at bedtime. (Patient taking differently: Take 1 mg by mouth in the morning and at bedtime. One in the morning one in the afternoon and 2 at night.), Disp: 90 tablet, Rfl: 1   rivaroxaban (XARELTO) 10 MG TABS tablet, Take 10 mg by mouth daily., Disp: , Rfl:    Semaglutide, 1 MG/DOSE, (OZEMPIC, 1 MG/DOSE,) 4 MG/3ML SOPN, Inject 1 mg into the skin once a week., Disp: 9 mL, Rfl: 1   solifenacin (VESICARE) 5 MG tablet, Take 1 tablet (5 mg total) by mouth daily., Disp: 90 tablet, Rfl: 1   SYNTHROID 50 MCG tablet, TAKE 1 TABLET DAILY, Disp: 90 tablet, Rfl: 3   vitamin B-12 (CYANOCOBALAMIN) 1000 MCG tablet, Take 1,000 mcg by mouth daily.,  Disp: , Rfl:    WIXELA INHUB 100-50 MCG/ACT AEPB, Inhale 1 puff into the lungs 2 (two) times daily., Disp: 60 each, Rfl: 1   glucose blood (FREESTYLE LITE) test strip, Use as instructed to check blood sugars  3 times per day dx: e11.65, Disp: 300 each, Rfl: 1   insulin glargine, 2 Unit  Dial, (TOUJEO MAX SOLOSTAR) 300 UNIT/ML Solostar Pen, Inject 13 Units into the skin at bedtime., Disp: , Rfl:    Insulin Pen Needle (NOVOFINE PEN NEEDLE) 32G X 6 MM MISC, Use with insulin pens, Disp: 300 each, Rfl: 3   meloxicam (MOBIC) 15 MG tablet, Take 15 mg by mouth daily., Disp: , Rfl:   Current Facility-Administered Medications:    cyanocobalamin ((VITAMIN B-12)) injection 1,000 mcg, 1,000 mcg, Intramuscular, Once, [name], MD   Allergies  Allergen Reactions   No Known Allergies      Review of Systems  Constitutional: Negative.   Respiratory: Negative.    Cardiovascular: Negative.   Gastrointestinal: Negative.   Neurological: Negative.   Psychiatric/Behavioral: Negative.      Today's Vitals   03/08/21 1521  BP: 124/80  Pulse: 67  Temp: 98.1 F (36.7 C)  Weight: 222 lb 9.6 oz (101 kg)  Height: $Remove'5\' 10"'gNCMXyP$  (1.778 m)  PainSc: 0-No pain   Body mass index is 31.94 kg/m.  Wt Readings from Last 3 Encounters:  04/04/21 211 lb 6.4 oz (95.9 kg)  03/13/21 211 lb (95.7 kg)  03/08/21 222 lb 9.6 oz (101 kg)     Objective:  Physical Exam Vitals and nursing note reviewed.  Constitutional:      Appearance: Normal appearance.  HENT:     Head: Normocephalic and atraumatic.     Nose:     Comments: Masked     Mouth/Throat:     Comments: Masked  Eyes:     Extraocular Movements: Extraocular movements intact.  Cardiovascular:     Rate and Rhythm: Normal rate and regular rhythm.     Heart sounds: Normal heart sounds.  Pulmonary:     Effort: Pulmonary effort is normal.     Breath sounds: Normal breath sounds.  Abdominal:     Comments: Abdominal adiposity  Musculoskeletal:     Cervical back:  Normal range of motion.  Skin:    General: Skin is warm.  Neurological:     General: No focal deficit present.     Mental Status: He is alert.  Psychiatric:        Mood and Affect: Mood normal.        Assessment And Plan:     1. Type 2 diabetes mellitus with stage 2 chronic kidney disease, without long-term current use of insulin (HCC) Comments: Chronic, I will check labs as listed below. Encouraged to follow suggested diet. Pt advised I may need to start insulin, will make further recs once labs reviewed.  - CMP14+EGFR - Hemoglobin A1c  2. Hypertensive heart and renal disease with renal failure, stage 1 through stage 4 or unspecified chronic kidney disease, without heart failure Comments: Chronic, controlled. He is encouraged to follow low sodium diet.  No med changes today.   3. Frequent falls Comments: Declines PT. He plans to establish care with a "personal trainer that will help him with this".   4. Imbalance Comments: Please see above. Vitamin B12 level drawn May 2022 was wnl. No need for retesting at this time.   5. Postnasal drip Comments: I will send in rx Astelin NS one spray each nostril daily.  He will let me know if his sx persist.   6. Class 1 obesity due to excess calories with serious comorbidity and body mass index (BMI) of 31.0 to 31.9 in adult Comments: He is encouraged to gradually increase daily activity as tolerated, aiming for at least 150 minutes of exercise per week. He  was commended for hiring a Physiological scientist.   Patient was given opportunity to ask questions. Patient verbalized understanding of the plan and was able to repeat key elements of the plan. All questions were answered to their satisfaction.   I, Franklin Greenland, MD, have reviewed all documentation for this visit. The documentation on 03/08/21 for the exam, diagnosis, procedures, and orders are all accurate and complete.   IF YOU HAVE BEEN REFERRED TO A SPECIALIST, IT MAY TAKE 1-2 WEEKS TO  SCHEDULE/PROCESS THE REFERRAL. IF YOU HAVE NOT HEARD FROM US/SPECIALIST IN TWO WEEKS, PLEASE GIVE [place] A CALL AT [phone] X [phone].   THE PATIENT IS ENCOURAGED TO PRACTICE SOCIAL DISTANCING DUE TO THE COVID-19 PANDEMIC.

## 2021-03-08 NOTE — Patient Instructions (Signed)

## 2021-03-09 LAB — CMP14+EGFR
ALT: 34 IU/L (ref 0–44)
AST: 19 IU/L (ref 0–40)
Albumin/Globulin Ratio: 1.4 (ref 1.2–2.2)
Albumin: 3.7 g/dL (ref 3.7–4.7)
Alkaline Phosphatase: 78 IU/L (ref 44–121)
BUN/Creatinine Ratio: 34 — ABNORMAL HIGH (ref 10–24)
BUN: 29 mg/dL — ABNORMAL HIGH (ref 8–27)
Bilirubin Total: 0.5 mg/dL (ref 0.0–1.2)
CO2: 26 mmol/L (ref 20–29)
Calcium: 9.7 mg/dL (ref 8.6–10.2)
Chloride: 97 mmol/L (ref 96–106)
Creatinine, Ser: 0.86 mg/dL (ref 0.76–1.27)
Globulin, Total: 2.6 g/dL (ref 1.5–4.5)
Glucose: 326 mg/dL — ABNORMAL HIGH (ref 70–99)
Potassium: 4.8 mmol/L (ref 3.5–5.2)
Sodium: 139 mmol/L (ref 134–144)
Total Protein: 6.3 g/dL (ref 6.0–8.5)
eGFR: 90 mL/min/{1.73_m2} (ref >59)

## 2021-03-09 LAB — HEMOGLOBIN A1C
Est. average glucose Bld gHb Est-mCnc: 258 mg/dL
Hgb A1c MFr Bld: 10.6 % — ABNORMAL HIGH (ref 4.8–5.6)

## 2021-03-12 DIAGNOSIS — M150 Primary generalized (osteo)arthritis: Secondary | ICD-10-CM | POA: Diagnosis not present

## 2021-03-12 DIAGNOSIS — M351 Other overlap syndromes: Secondary | ICD-10-CM | POA: Diagnosis not present

## 2021-03-13 ENCOUNTER — Ambulatory Visit: Payer: Medicare Other

## 2021-03-13 ENCOUNTER — Other Ambulatory Visit: Payer: Self-pay

## 2021-03-13 VITALS — BP 122/78 | HR 81 | Temp 98.1°F | Ht 70.0 in | Wt 211.0 lb

## 2021-03-13 DIAGNOSIS — E1122 Type 2 diabetes mellitus with diabetic chronic kidney disease: Secondary | ICD-10-CM

## 2021-03-13 NOTE — Progress Notes (Signed)
Pt presents today for tresiba teaching. He was given 3 samples of Tresiba 25mg . Pt notified to give [place] a call on Mondays & Thursdays with readings. Told to take sugar every morning & start at 6 units. Inject every night at 10pm. He states he will start today when settled at home.

## 2021-03-19 ENCOUNTER — Telehealth: Payer: Self-pay

## 2021-03-19 ENCOUNTER — Other Ambulatory Visit: Payer: Self-pay

## 2021-03-19 MED ORDER — NOVOFINE PEN NEEDLE 32G X 6 MM MISC: 3 refills | Status: DC

## 2021-03-19 MED ORDER — TRESIBA FLEXTOUCH 200 UNIT/ML ~~LOC~~ SOPN: 10.0000 [IU] | PEN_INJECTOR | Freq: Every evening | SUBCUTANEOUS | 3 refills | Status: DC

## 2021-03-19 NOTE — Chronic Care Management (AMB) (Signed)
    Called [name], No answer, left message of appointment on 03-20-2021 at 2:00 via telephone visit with Orlando Penner, Pharm D. Notified to have all medications, supplements, blood pressure and/or blood sugar logs available during appointment and to return call if need to reschedule.   Care Gaps: Yearly ophthalmology exam overdue Covid booster overdue Yearly foot exam overdue  Star Rating Drug: Atorvastatin 20 mg- Last filled 02-19-2021 [facility] Ozempic 0.5 mg- Last filled 02-08-2021 [facility] Losartan 50 mg- Last filled 12-27-2020 [facility] Jardiance 10 mg- Last filled 03-11-2021 [facility]  Any gaps in medications fill history? No   [place] Pharmacist Assistant [phone]

## 2021-03-20 ENCOUNTER — Ambulatory Visit (INDEPENDENT_AMBULATORY_CARE_PROVIDER_SITE_OTHER): Payer: Medicare Other

## 2021-03-20 DIAGNOSIS — E1122 Type 2 diabetes mellitus with diabetic chronic kidney disease: Secondary | ICD-10-CM

## 2021-03-20 DIAGNOSIS — I1310 Hypertensive heart and chronic kidney disease without heart failure, with stage 1 through stage 4 chronic kidney disease, or unspecified chronic kidney disease: Secondary | ICD-10-CM

## 2021-03-20 NOTE — Progress Notes (Signed)
  Chronic Care Management Pharmacy Note  03/21/2021 Name:  Stephanos S Panas MRN:  [id number] DOB:  07/15/1945  Summary: Patient reports that he is doing better but is going to start physical therapy soon to help with leg weakness. He is taking his medication but admits to having some changes in his lifestyle and diet.    Recommendations/Changes made from today's visit: Recommendation is to increase the fresh and fruit and vegetables. Continuing checking BS and start checking BP. Recommend patient determine what the cost.   Plan: Patient is going to increase the amount of vegetables he is eating per day and limit the amount of milk he drinks to 16 ounces per day and increase his water. Collaborated with PCP team to set up an appointment for the patient to be seen in two months. Collaborated with PCP patient to try taking Furosemide every other day and see if that helps with urinary frequency. Called patient to inform them of this recommendation with no answer.    Subjective: Sebastyan S Toelle is an 75 y.o. year old male who is a primary patient of Sanders, Robyn, MD.  The CCM team was consulted for assistance with disease management and care coordination needs.    Engaged with patient by telephone for follow up visit in response to provider referral for pharmacy case management and/or care coordination services. He went from a very active to having no energy and now is in physical therapy. He is now walking with a walker as well.   Consent to Services:  The patient was given information about Chronic Care Management services, agreed to services, and gave verbal consent prior to initiation of services.  Please see initial visit note for detailed documentation.   Patient Care Team: Sanders, Robyn, MD as PCP - General (Internal Medicine) Chodri, Tanvir, MD as Referring Physician (Specialist) [name], [name] T, MD as Referring Physician (Internal Medicine) Groat, Christopher, MD as Consulting  Physician (Ophthalmology) Ganji, Jay, MD as Consulting Physician (Cardiology) Caudill, Courtney K, RPH (Inactive) (Pharmacist)  Recent office visits:  12/06/2020 PCP OV  Recent consult visits: 01/25/2021 Cardiology OV   ER visits:  02/23/2021 Hyperglycemia visit  01/30/2021 Hyperglycemia visit    Objective:  Lab Results  Component Value Date   CREATININE 0.86 03/08/2021   BUN 29 (H) 03/08/2021   GFRNONAA 68 03/14/2020   GFRAA 79 03/14/2020   NA 139 03/08/2021   K 4.8 03/08/2021   CALCIUM 9.7 03/08/2021   CO2 26 03/08/2021   GLUCOSE 326 (H) 03/08/2021    Lab Results  Component Value Date/Time   HGBA1C 10.6 (H) 03/08/2021 04:14 PM   HGBA1C 7.0 (H) 12/06/2020 03:42 PM   MICROALBUR 30 07/30/2018 12:32 PM    Last diabetic Eye exam:  Lab Results  Component Value Date/Time   HMDIABEYEEXA No Retinopathy 04/26/2019 12:00 AM    Last diabetic Foot exam: No results found for: HMDIABFOOTEX   Lab Results  Component Value Date   CHOL 181 07/31/2020   HDL 47 07/31/2020   LDLCALC 85 07/31/2020   TRIG 298 (H) 07/31/2020   CHOLHDL 3.9 07/31/2020    Hepatic Function Latest Ref Rng & Units 03/08/2021 12/06/2020 07/31/2020  Total Protein 6.0 - 8.5 g/dL 6.3 6.8 6.9  Albumin 3.7 - 4.7 g/dL 3.7 4.1 3.9  AST 0 - 40 IU/L 19 25 23  ALT 0 - 44 IU/L 34 26 23  Alk Phosphatase 44 - 121 IU/L 78 68 77  Total Bilirubin 0.0 - 1.2 mg/dL 0.5   0.4 0.4    Lab Results  Component Value Date/Time   TSH 0.426 (L) 02/08/2021 11:26 AM   TSH 1.240 07/31/2020 04:19 PM    CBC Latest Ref Rng & Units 08/22/2020 12/14/2019 09/21/2018  WBC 3.4 - 10.8 x10E3/uL 7.3 6.8 8.0  Hemoglobin 13.0 - 17.7 g/dL 16.3 15.4 15.8  Hematocrit 37.5 - 51.0 % 47.7 44.5 47.7  Platelets 150 - 450 x10E3/uL 239 202 239    No results found for: VD25OH  Clinical ASCVD: Yes  The 10-year ASCVD risk score (Arnett DK, et al., 2019) is: 48.7%   Values used to calculate the score:     Age: 62 years     Sex: Male     Is  Non-Hispanic African American: No     Diabetic: Yes     Tobacco smoker: Yes     Systolic Blood Pressure: 299 mmHg     Is BP treated: Yes     HDL Cholesterol: 47 mg/dL     Total Cholesterol: 181 mg/dL    Depression screen [facility] 2/9 12/07/2020 12/06/2020 11/24/2019  Decreased Interest 0 0 0  Down, Depressed, Hopeless 0 0 0  PHQ - 2 Score 0 0 0  Altered sleeping - 0 3  Tired, decreased energy - 0 3  Change in appetite - 0 0  Feeling bad or failure about yourself  - 0 0  Trouble concentrating - 0 0  Moving slowly or fidgety/restless - 0 0  Suicidal thoughts - 0 0  PHQ-9 Score - 0 6  Difficult doing work/chores - - Not difficult at all     Social History   Tobacco Use  Smoking Status Former   Packs/day: 2.50   Years: 12.00   Pack years: 30.00   Types: Cigarettes   Start date: 04/23/1959   Quit date: 08/14/1971   Years since quitting: 49.6  Smokeless Tobacco Never  Tobacco Comments   he has quit   BP Readings from Last 3 Encounters:  03/13/21 122/78  03/08/21 124/80  02/08/21 114/72   Pulse Readings from Last 3 Encounters:  03/13/21 81  03/08/21 67  02/08/21 85   Wt Readings from Last 3 Encounters:  03/13/21 211 lb (95.7 kg)  03/08/21 222 lb 9.6 oz (101 kg)  02/08/21 225 lb 9.6 oz (102.3 kg)   BMI Readings from Last 3 Encounters:  03/13/21 30.28 kg/m  03/08/21 31.94 kg/m  02/08/21 32.37 kg/m    Assessment/Interventions: Review of patient past medical history, allergies, medications, health status, including review of consultants reports, laboratory and other test data, was performed as part of comprehensive evaluation and provision of chronic care management services.   SDOH:  (Social Determinants of Health) assessments and interventions performed: No  SDOH Screenings   Alcohol Screen: Not on file  Depression (PHQ2-9): Low Risk    PHQ-2 Score: 0  Financial Resource Strain: Low Risk    Difficulty of Paying Living Expenses: Not hard at all  Food Insecurity: No  Food Insecurity   Worried About Charity fundraiser in the Last Year: Never true   Ran Out of Food in the Last Year: Never true  Housing: Not on file  Physical Activity: Insufficiently Active   Days of Exercise per Week: 3 days   Minutes of Exercise per Session: 10 min  Social Connections: Not on file  Stress: No Stress Concern Present   Feeling of Stress : Not at all  Tobacco Use: Medium Risk   Smoking Tobacco Use: Former  Smokeless Tobacco Use: Never   Passive Exposure: Not on file  Transportation Needs: No Transportation Needs   Lack of Transportation (Medical): No   Lack of Transportation (Non-Medical): No    CCM Care Plan  Allergies  Allergen Reactions   No Known Allergies     Medications Reviewed Today     Reviewed by ,  J, RPH (Pharmacist) on 03/20/21 at 1442  Med List Status: <None>   Medication Order Taking? Sig Documenting Provider Last Dose Status Informant  albuterol (PROVENTIL HFA;VENTOLIN HFA) 108 (90 Base) MCG/ACT inhaler [id number] Yes Inhale 2 puffs into the lungs every 6 (six) hours as needed for wheezing or shortness of breath. [provider]  Active   albuterol (PROVENTIL) (2.5 MG/3ML) 0.083% nebulizer solution [id number] Yes Take 3 mLs (2.5 mg total) by nebulization every 6 (six) hours as needed for wheezing or shortness of breath. Moore, Janece, FNP  Active   amiodarone (PACERONE) 100 MG tablet [id number] Yes Take 100 mg by mouth daily.  [provider]  Active   amphetamine-dextroamphetamine (ADDERALL) 10 MG tablet [id number] Yes Take 1 tablet by mouth daily. Take 2 tablets in the morning and one tablet in the evenings. [provider]  Active   Armodafinil 250 MG tablet [id number] Yes Take 125-250 mg by mouth every morning. [provider]  Active   aspirin 81 MG tablet [id number] Yes Take 81 mg by mouth daily. [provider]  Active   atorvastatin (LIPITOR) 20 MG tablet [id number] Yes TAKE 1 TABLET DAILY  Sanders, Robyn, MD  Active   azelastine (ASTELIN) 0.1 % nasal spray [id number] Yes Place 1 spray into both nostrils 2 (two) times daily. Use in each nostril as directed Sanders, Robyn, MD  Active   b complex vitamins capsule [id number] Yes Take 1 capsule by mouth daily. [provider]  Active   benzonatate (TESSALON PERLES) 100 MG capsule [id number] Yes Take 1 capsule (100 mg total) by mouth 3 (three) times daily as needed for cough. Ghumman, Ramandeep, NP Taking Active   carvedilol (COREG) 6.25 MG tablet [id number] Yes Take 6.25 mg by mouth 2 (two) times daily with a meal. [provider] Taking Active   Cholecalciferol (VITAMIN D) 2000 units tablet [id number] Yes Take 2,000 Units by mouth daily. [provider] Taking Active   cyanocobalamin ((VITAMIN B-12)) injection 1,000 mcg [id number]   Sanders, Robyn, MD  Active   diphenhydrAMINE (BENADRYL) 25 MG tablet [id number] Yes Take 25 mg by mouth every 6 (six) hours as needed for itching.  [provider]  Active   donepezil (ARICEPT) 10 MG tablet [id number] Yes Take 1 tablet (10 mg total) by mouth at bedtime. Sanders, Robyn, MD  Active   DULoxetine (CYMBALTA) 30 MG capsule [id number] Yes TAKE 1 CAPSULE DAILY IN THE EVENING Sanders, Robyn, MD Taking Active   furosemide (LASIX) 20 MG tablet [id number] Yes Take 20 mg by mouth daily as needed.  [provider] Taking Active   gabapentin (NEURONTIN) 600 MG tablet [id number] Yes TAKE 1 TABLET THREE TIMES A DAY Sanders, Robyn, MD Taking Active   glucose monitoring kit (FREESTYLE) monitoring kit [id number] Yes 1 each by Does not apply route as needed for other. [provider] Taking Active   HYDROcodone-acetaminophen (NORCO/VICODIN) 5-325 MG tablet [id number] Yes Take 1 tablet by mouth every 6 (six) hours as needed for moderate pain. [provider] Taking Active   insulin degludec (TRESIBA FLEXTOUCH) 200 UNIT/ML FlexTouch Pen [id number] Yes Inject 10 Units into    the skin at bedtime. [name], MD Taking Active   Insulin Pen Needle (NOVOFINE PEN NEEDLE) 32G X 6 MM MISC [id number] Yes Use with insulin pens [name], MD Taking Active   ipratropium (ATROVENT) 0.03 % nasal spray [id number] Yes Place 2 sprays into both nostrils every 12 (twelve) hours. [provider] Taking Active   JARDIANCE 10 MG TABS tablet [id number] Yes TAKE 1 TABLET DAILY [name], MD Taking Active   levocetirizine (XYZAL) 5 MG tablet [id number] Yes Take 5 mg by mouth every evening.  [provider] Taking Active   lidocaine (LIDODERM) 5 % [id number] Yes Place 3 patches onto the skin daily. May use 1-3 patches at one time, Remove & Discard patch within 12 hours or as directed by MD [name], MD Taking Active   losartan (COZAAR) 50 MG tablet [id number] Yes Take 50 mg by mouth 2 (two) times daily.  [provider] Taking Active   Magnesium 250 MG TABS [id number] Yes Take 1 tablet by mouth daily. [provider] Taking Active   methocarbamol (ROBAXIN) 750 MG tablet [id number] Yes Take 750 mg by mouth 3 (three) times daily as needed for muscle spasms.  [provider] Taking Active   mirabegron ER (MYRBETRIQ) 50 MG TB24 tablet [id number] Yes Take 50 mg by mouth daily. [provider] Taking Active   montelukast (SINGULAIR) 10 MG tablet [id number] Yes Take 10 mg by mouth at bedtime. [provider] Taking Active   Multiple Vitamin (MULTIVITAMIN) tablet [id number] Yes Take 1 tablet by mouth daily. [provider] Taking Active   nitroGLYCERIN (NITROSTAT) 0.4 MG SL tablet [id number] Yes Place 0.4 mg under the tongue every 5 (five) minutes as needed for chest pain.  [provider] Taking Active   Omega-3 Fatty Acids (FISH OIL) 1200 MG CAPS [id number] Yes Take 1,200 mg by mouth.  [provider] Taking Active   pantoprazole (PROTONIX) 40 MG tablet [id number] Yes TAKE 1 TABLET DAILY [name], MD Taking  Active   pramipexole (MIRAPEX) 0.5 MG tablet [id number] Yes Take 2 tablets (1 mg total) by mouth in the morning and at bedtime.  Patient taking differently: Take 1 mg by mouth in the morning and at bedtime. One in the morning one in the afternoon and 2 at night.   [name], MD Taking Active   rivaroxaban (XARELTO) 10 MG TABS tablet [id number] Yes Take 10 mg by mouth daily. [provider] Taking Active   Semaglutide, 1 MG/DOSE, (OZEMPIC, 1 MG/DOSE,) 4 MG/3ML SOPN [id number] Yes Inject 1 mg into the skin once a week. [name], FNP Taking Active   solifenacin (VESICARE) 5 MG tablet [id number] Yes Take 1 tablet (5 mg total) by mouth daily. [name], MD Taking Active   SYNTHROID 50 MCG tablet [id number] Yes TAKE 1 TABLET DAILY [name], MD Taking Active   vitamin B-12 (CYANOCOBALAMIN) 1000 MCG tablet [id number] Yes Take 1,000 mcg by mouth daily. [provider] Taking Active   WIXELA INHUB 100-50 MCG/ACT AEPB [id number] Yes Inhale 1 puff into the lungs 2 (two) times daily. [name], FNP Taking Active   Med List Note [name], MD 12/06/20 1448): NOrco prescribed by the [facility]            Patient Active Problem List   Diagnosis Date Noted   Mild cognitive impairment 12/08/2020   Recurrent cellulitis of lower extremity 03/26/2020   Atherosclerosis of aorta ([place]) 03/26/2020   Class 2 severe obesity due to excess calories  with serious comorbidity and body mass index (BMI) of 35.0 to 35.9 in adult (HCC) 03/26/2020   PVD (peripheral vascular disease) (HCC) 12/30/2018   Type 2 diabetes mellitus with stage 2 chronic kidney disease, without long-term current use of insulin (HCC) 06/17/2018   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   Atherosclerosis of native coronary artery of native heart without angina pectoris 06/17/2018   Arrhythmia 11/18/2017   Hyperlipidemia 11/18/2017   Hypertension 11/18/2017   Varicose veins of left lower extremity with  complications 08/14/2015   Pain in both lower extremities 08/14/2015   Globus pharyngeus 07/17/2015   Abdominal pain 12/12/2013   History of carcinoma in situ of prostate 12/12/2013   Male erectile dysfunction 12/12/2013   Personal history of prostate cancer 12/12/2013   OAB (overactive bladder) 11/03/2013   Nocturia 11/03/2013   Stress incontinence 11/03/2013   Urinary incontinence 11/03/2013   Headache associated with sexual activity 12/14/2012   Memory loss 12/14/2012   Myalgia and myositis 12/14/2012   Obstructive sleep apnea 12/14/2012   Insomnia 12/14/2012   Partial epilepsy with impairment of consciousness, with intractable epilepsy (HCC) 12/14/2012   Restless legs syndrome 12/14/2012   Tension type headache 12/14/2012   Dehydration, severe 07/11/2012   Metabolic acidosis 07/11/2012   Noninfective gastroenteritis and colitis 07/11/2012    Immunization History  Administered Date(s) Administered   Fluad Quad(high Dose 65+) 03/14/2020, 02/08/2021   H1N1 07/04/2008   Influenza Split 02/20/2014   Influenza, High Dose Seasonal PF 12/30/2018   Influenza, Seasonal, Injecte, Preservative Fre 04/28/2015   Influenza,inj,Quad PF,6+ Mos 05/04/2018   Moderna SARS-COV2 Booster Vaccination 02/18/2020   Moderna Sars-Covid-2 Vaccination 06/30/2019, 07/21/2019, 07/28/2019, 03/20/2020   Pneumococcal Conjugate-13 03/10/2014   Pneumococcal Polysaccharide-23 08/31/2014   Pneumococcal-Unspecified 04/10/2010, 08/31/2014   Tdap 03/10/2014, 11/03/2015   Zoster Recombinat (Shingrix) 05/16/2020, 07/17/2020    Conditions to be addressed/monitored:  Hypertension and Diabetes  Care Plan : CCM Pharmacy Care Plan  Updates made by ,  J, RPH since 03/21/2021 12:00 AM     Problem: HTN, DM II      Long-Range Goal: Disease Management   Start Date: 08/24/2020  Recent Progress: On track  Priority: High  Note:   Current Barriers:  Unable to independently monitor therapeutic  efficacy  Pharmacist Clinical Goal(s):  Patient will achieve adherence to monitoring guidelines and medication adherence to achieve therapeutic efficacy through collaboration with PharmD and provider.   Interventions: 1:1 collaboration with Sanders, Robyn, MD regarding development and update of comprehensive plan of care as evidenced by provider attestation and co-signature Inter-disciplinary care team collaboration (see longitudinal plan of care) Comprehensive medication review performed; medication list updated in electronic medical record  Hypertension (BP goal <130/80) -Controlled -Current treatment: Carvedilol 6.25 mg tablet twice per day Losartan 50 mg tablet once per day Amiodarone 10 mg tablet oncer per day  -Current home readings: patient has not been checking his BP at home.  -Current dietary habits: please see diabetes for further details  -Current exercise habits: he is going to start physical therapy, and he is going to start working on his universal gym and his treadmill.   -Denies hypotensive/hypertensive symptoms -Educated on Importance of home blood pressure monitoring; Proper BP monitoring technique; -Counseled to monitor BP at home at least four times, document, and provide log at future appointments -Recommended patient consult with the physical therapist team prior to doing any type of exercise to find out any considerations or recommendations they have. -Recommended to continue current medication    Diabetes (A1c goal <8%) -Uncontrolled -Current medications: Jardiance 10 mg tablet once per day  Tresiba Flextouch - 10 units nightly Ozempic 1 mg weekly  -Current home glucose readings fasting glucose: he started documenting on 11/23-206, 325, 323, 264, 205, 316, 317 -Denies hypoglycemic/hyperglycemic symptoms  -Current meal patterns:  breakfast: biscuit and gravy, soft boiled egg, with a piece of toast, waffle with sugar free syrup, omelette  lunch: sandwich or  bowl of soup   dinner: stouffers frozen meals low in calories  drinks: drinking a lot of milk a gallon to 1/2 gallon of milk per day -Current exercise: please see hypertension  -Educated on Complications of diabetes including kidney damage, retinal damage, and cardiovascular disease; Benefits of weight loss; Carbohydrate counting and/or plate method -He is going to drink more water and decrease to 16 ounces.  -We discussed the use of rice vinegar for his dressing -We discussed diet in great details and the importance of non starchy vegetables  -We discussed in great detail what food products contain a lot of carbohydrates.  He has lost weight over the pas 4 monhs  -Counseled to check feet daily and get yearly eye exams -Recommended to continue current medication  Health Maintenance -Vaccine gaps: COVID-19 vaccine -Spoke with patient and he let me know that he is taking Meloxicam with Xarelto. I recommended that he discuss this with the doctor that prescribed the Meloxicam. Patient voiced understanding and is going to follow up if with us on the outcome.    - take medications as prescribed as evidenced by patient report and record review check glucose at least twice per day, document, and provide at future appointments  Follow Up Plan: The patient has been provided with contact information for the care management team and has been advised to call with any health related questions or concerns.  Next PCP appointment scheduled for:  05/03/2021      Medication Assistance: None required.  Patient affirms current coverage meets needs.  Compliance/Adherence/Medication fill history: Care Gaps: COVID-19 Vaccine, Foot Exam, Ophthalmology Exam   Star-Rating Drugs: Atorvastatin 20 mg tablet Jardiance 10 mg tablet   Patient's preferred pharmacy is:  [facility] - [place], [place] - [address] [place] [address] Phone: [phone] Fax:  [phone]  [id number] - [place], [place] - [address] AT [address] OF [employer] [address] [place] [address] Phone: [phone] Fax: [phone]  [id number] - [place], [place] - [address] [place] [address] Phone: [phone] Fax: [phone]  Uses pill box? Yes Pt endorses 90% compliance  We discussed: Benefits of medication synchronization, packaging and delivery as well as enhanced pharmacist oversight with [employer]. Patient decided to: Continue current medication management strategy  Care Plan and Follow Up Patient Decision:  Patient agrees to Care Plan and Follow-up.  Plan: The patient has been provided with contact information for the care management team and has been advised to call with any health related questions or concerns.    , CPP, PharmD Clinical Pharmacist Practitioner [facility] [phone]      

## 2021-03-21 DIAGNOSIS — N182 Chronic kidney disease, stage 2 (mild): Secondary | ICD-10-CM

## 2021-03-21 DIAGNOSIS — E1122 Type 2 diabetes mellitus with diabetic chronic kidney disease: Secondary | ICD-10-CM | POA: Diagnosis not present

## 2021-03-21 DIAGNOSIS — I1310 Hypertensive heart and chronic kidney disease without heart failure, with stage 1 through stage 4 chronic kidney disease, or unspecified chronic kidney disease: Secondary | ICD-10-CM

## 2021-03-21 NOTE — Patient Instructions (Signed)
Visit Information It was great speaking with you today!  Please let me know if you have any questions about our visit.   Goals Addressed             This Visit's Progress    Manage My Medicine       Timeframe:  Long-Range Goal Priority:  High Start Date:                             Expected End Date:                       Follow Up Date 04/25/2021   - call for medicine refill 2 or 3 days before it runs out - call if I am sick and can't take my medicine - keep a list of all the medicines I take; vitamins and herbals too - use a pillbox to sort medicine - use an alarm clock or phone to remind me to take my medicine    Why is this important?   These steps will help you keep on track with your medicines.         Patient Care Plan: CCM Pharmacy Care Plan     Problem Identified: HTN, DM II      Long-Range Goal: Disease Management   Start Date: 08/24/2020  Recent Progress: On track  Priority: High  Note:   Current Barriers:  Unable to independently monitor therapeutic efficacy  Pharmacist Clinical Goal(s):  Patient will achieve adherence to monitoring guidelines and medication adherence to achieve therapeutic efficacy through collaboration with PharmD and provider.   Interventions: 1:1 collaboration with [name], MD regarding development and update of comprehensive plan of care as evidenced by provider attestation and co-signature Inter-disciplinary care team collaboration (see longitudinal plan of care) Comprehensive medication review performed; medication list updated in electronic medical record  Hypertension (BP goal <130/80) -Controlled -Current treatment: Carvedilol 6.25 mg tablet twice per day Losartan 50 mg tablet once per day Amiodarone 10 mg tablet oncer per day  -Current home readings: patient has not been checking his BP at home.  -Current dietary habits: please see diabetes for further details  -Current exercise habits: he is going to start  physical therapy, and he is going to start working on his universal gym and his treadmill.   -Denies hypotensive/hypertensive symptoms -Educated on Importance of home blood pressure monitoring; Proper BP monitoring technique; -Counseled to monitor BP at home at least four times, document, and provide log at future appointments -Recommended patient consult with the physical therapist team prior to doing any type of exercise to find out any considerations or recommendations they have. -Recommended to continue current medication  Diabetes (A1c goal <8%) -Uncontrolled -Current medications: Jardiance 10 mg tablet once per day  Tresiba Flextouch - 10 units nightly Ozempic 1 mg weekly  -Current home glucose readings fasting glucose: he started documenting on 11/23-206, 325, 323, 264, 205, 316, 317 -Denies hypoglycemic/hyperglycemic symptoms  -Current meal patterns:  breakfast: biscuit and gravy, soft boiled egg, with a piece of toast, waffle with sugar free syrup, omelette  lunch: sandwich or bowl of soup   dinner: stouffers frozen meals low in calories  drinks: drinking a lot of milk a gallon to 1/2 gallon of milk per day -Current exercise: please see hypertension  -Educated on Complications of diabetes including kidney damage, retinal damage, and cardiovascular disease; Benefits of weight loss; Carbohydrate counting and/or plate method -  He is going to drink more water and decrease to 16 ounces.  -We discussed the use of rice vinegar for his dressing -We discussed diet in great details and the importance of non starchy vegetables  -We discussed in great detail what food products contain a lot of carbohydrates.  He has lost weight over the pas 4 monhs  -Counseled to check feet daily and get yearly eye exams -Recommended to continue current medication  Health Maintenance -Vaccine gaps: COVID-19 vaccine -Spoke with patient and he let me know that he is taking Meloxicam with Xarelto. I  recommended that he discuss this with the doctor that prescribed the Meloxicam. Patient voiced understanding and is going to follow up if with [place] on the outcome.    - take medications as prescribed as evidenced by patient report and record review check glucose at least twice per day, document, and provide at future appointments  Follow Up Plan: The patient has been provided with contact information for the care management team and has been advised to call with any health related questions or concerns.  Next PCP appointment scheduled for:  05/03/2021      Patient agreed to services and verbal consent obtained.   The patient verbalized understanding of instructions, educational materials, and care plan provided today and agreed to receive a mailed copy of patient instructions, educational materials, and care plan.   Orlando Penner, PharmD Clinical Pharmacist [facility] [phone]

## 2021-03-22 ENCOUNTER — Telehealth: Payer: Self-pay

## 2021-03-22 NOTE — Chronic Care Management (AMB) (Signed)
Chronic Care Management Pharmacy Assistant   Name: Franklin Thornton  MRN: [id number] DOB: 30-Jun-1945   Reason for Encounter: Disease State/ Diabetes  Recent office visits:  None  Recent consult visits:  None  Hospital visits:  Medication Reconciliation was completed by comparing discharge summary, patient's EMR and Pharmacy list, and upon discussion with patient.  Admitted to the hospital on 02-23-2021 due to elevated blood sugar and cold symptoms. Discharge date was 02-23-2021. Discharged from [place]?Medications Started at [facility] Discharge:?? None  Medication Changes at Hospital Discharge: None  Medications Discontinued at Hospital Discharge: None  Medications that remain the same after Hospital Discharge:??  -All other medications will remain the same.    Hospital visits:  Medication Reconciliation was completed by comparing discharge summary, patient's EMR and Pharmacy list, and upon discussion with patient.  Admitted to the hospital on 01-30-2021 due to elevated blood sugar . Discharge date was 01-30-2021. Discharged from [place]?Medications Started at [facility] Discharge:?? None  Medication Changes at Hospital Discharge: None  Medications Discontinued at Hospital Discharge: None  Medications that remain the same after Hospital Discharge:??  -All other medications will remain the same.    Medications: Outpatient Encounter Medications as of 03/22/2021  Medication Sig Note   albuterol (PROVENTIL HFA;VENTOLIN HFA) 108 (90 Base) MCG/ACT inhaler Inhale 2 puffs into the lungs every 6 (six) hours as needed for wheezing or shortness of breath.    albuterol (PROVENTIL) (2.5 MG/3ML) 0.083% nebulizer solution Take 3 mLs (2.5 mg total) by nebulization every 6 (six) hours as needed for wheezing or shortness of breath.    amiodarone (PACERONE) 100 MG tablet Take 100 mg by mouth daily.     amphetamine-dextroamphetamine  (ADDERALL) 10 MG tablet Take 1 tablet by mouth daily. Take 2 tablets in the morning and one tablet in the evenings.    Armodafinil 250 MG tablet Take 125-250 mg by mouth every morning.    aspirin 81 MG tablet Take 81 mg by mouth daily.    atorvastatin (LIPITOR) 20 MG tablet TAKE 1 TABLET DAILY    azelastine (ASTELIN) 0.1 % nasal spray Place 1 spray into both nostrils 2 (two) times daily. Use in each nostril as directed    b complex vitamins capsule Take 1 capsule by mouth daily.    benzonatate (TESSALON PERLES) 100 MG capsule Take 1 capsule (100 mg total) by mouth 3 (three) times daily as needed for cough.    carvedilol (COREG) 6.25 MG tablet Take 6.25 mg by mouth 2 (two) times daily with a meal.    Cholecalciferol (VITAMIN D) 2000 units tablet Take 2,000 Units by mouth daily.    diphenhydrAMINE (BENADRYL) 25 MG tablet Take 25 mg by mouth every 6 (six) hours as needed for itching.     donepezil (ARICEPT) 10 MG tablet Take 1 tablet (10 mg total) by mouth at bedtime.    DULoxetine (CYMBALTA) 30 MG capsule TAKE 1 CAPSULE DAILY IN THE EVENING    furosemide (LASIX) 20 MG tablet Take 20 mg by mouth daily as needed.     gabapentin (NEURONTIN) 600 MG tablet TAKE 1 TABLET THREE TIMES A DAY    glucose monitoring kit (FREESTYLE) monitoring kit 1 each by Does not apply route as needed for other.    HYDROcodone-acetaminophen (NORCO/VICODIN) 5-325 MG tablet Take 1 tablet by mouth every 6 (six) hours as needed for moderate pain.    insulin degludec (TRESIBA FLEXTOUCH) 200 UNIT/ML  FlexTouch Pen Inject 10 Units into the skin at bedtime.    Insulin Pen Needle (NOVOFINE PEN NEEDLE) 32G X 6 MM MISC Use with insulin pens    ipratropium (ATROVENT) 0.03 % nasal spray Place 2 sprays into both nostrils every 12 (twelve) hours.    JARDIANCE 10 MG TABS tablet TAKE 1 TABLET DAILY    levocetirizine (XYZAL) 5 MG tablet Take 5 mg by mouth every evening.     lidocaine (LIDODERM) 5 % Place 3 patches onto the skin daily. May use  1-3 patches at one time, Remove & Discard patch within 12 hours or as directed by MD    losartan (COZAAR) 50 MG tablet Take 50 mg by mouth 2 (two) times daily.     Magnesium 250 MG TABS Take 1 tablet by mouth daily.    meloxicam (MOBIC) 15 MG tablet Take 15 mg by mouth daily.    methocarbamol (ROBAXIN) 750 MG tablet Take 750 mg by mouth 3 (three) times daily as needed for muscle spasms.     mirabegron ER (MYRBETRIQ) 50 MG TB24 tablet Take 50 mg by mouth daily.    montelukast (SINGULAIR) 10 MG tablet Take 10 mg by mouth at bedtime.    Multiple Vitamin (MULTIVITAMIN) tablet Take 1 tablet by mouth daily.    nitroGLYCERIN (NITROSTAT) 0.4 MG SL tablet Place 0.4 mg under the tongue every 5 (five) minutes as needed for chest pain.     Omega-3 Fatty Acids (FISH OIL) 1200 MG CAPS Take 1,200 mg by mouth.     pantoprazole (PROTONIX) 40 MG tablet TAKE 1 TABLET DAILY    pramipexole (MIRAPEX) 0.5 MG tablet Take 2 tablets (1 mg total) by mouth in the morning and at bedtime. (Patient taking differently: Take 1 mg by mouth in the morning and at bedtime. One in the morning one in the afternoon and 2 at night.)    rivaroxaban (XARELTO) 10 MG TABS tablet Take 10 mg by mouth daily.    Semaglutide, 1 MG/DOSE, (OZEMPIC, 1 MG/DOSE,) 4 MG/3ML SOPN Inject 1 mg into the skin once a week.    solifenacin (VESICARE) 5 MG tablet Take 1 tablet (5 mg total) by mouth daily.    SYNTHROID 50 MCG tablet TAKE 1 TABLET DAILY 03/20/2021: Taking 1 tablet by mouth Monday through Friday only.   vitamin B-12 (CYANOCOBALAMIN) 1000 MCG tablet Take 1,000 mcg by mouth daily.    WIXELA INHUB 100-50 MCG/ACT AEPB Inhale 1 puff into the lungs 2 (two) times daily.    Facility-Administered Encounter Medications as of 03/22/2021  Medication   cyanocobalamin ((VITAMIN B-12)) injection 1,000 mcg   Recent Relevant Labs: Lab Results  Component Value Date/Time   HGBA1C 10.6 (H) 03/08/2021 04:14 PM   HGBA1C 7.0 (H) 12/06/2020 03:42 PM   MICROALBUR  30 07/30/2018 12:32 PM    Kidney Function Lab Results  Component Value Date/Time   CREATININE 0.86 03/08/2021 04:14 PM   CREATININE 0.97 12/06/2020 03:42 PM   GFRNONAA 68 03/14/2020 02:46 PM   GFRAA 79 03/14/2020 02:46 PM    Current antihyperglycemic regimen:  Jardiance 10 mg tablet once per day  Tresiba Flextouch - 10 units nightly Ozempic 1 mg weekly   What recent interventions/DTPs have been made to improve glycemic control:  Educated on Complications of diabetes including kidney damage, retinal damage, and cardiovascular disease; Benefits of weight loss; Carbohydrate counting and/or plate method -He is going to drink more water and decrease to 16 ounces.  -We discussed the use of  rice vinegar for his dressing -We discussed diet in great details and the importance of non starchy vegetables  -We discussed in great detail what food products contain a lot of carbohydrates.  He has lost weight over the pas 4 monhs  -Counseled to check feet daily and get yearly eye exams  Have there been any recent hospitalizations or ED visits since last visit with CPP? No  Patient denies hypoglycemic symptoms Patient reports hyperglycemic symptoms, including blurry vision, excessive thirst, fatigue, and weakness  How often are you checking your blood sugar? once daily  What are your blood sugars ranging?  Fasting: 11-28 316, 11-29 317, 11-30 295, 12-01 204 Before meals: None After meals: None Bedtime: None  During the week, how often does your blood glucose drop below 70? Never  Are you checking your feet daily/regularly? Daily  Adherence Review: Is the patient currently on a STATIN medication? Yes Is the patient currently on ACE/ARB medication? Yes Does the patient have >5 day gap between last estimated fill dates? No  NOTES: Will follow up with patient on blood sugar readings next week. Advised patient to go to the ER if symptoms become unbearable.  Care Gaps: Yearly  Ophthalmology exam overdue Covid booster overdue Yearly foot exam overdue  Star Rating Drugs: Ozempic 1 mg- Patient assistance Jardiance 10 mg- Last filled 03-11-2021 [facility] Losartan 50 mg- Last filled 12-27-2020 [facility] Atorvastatin 20 mg- Last filled 02-19-2021 [facility]  [place] Clinical Pharmacist Assistant [phone]

## 2021-03-27 ENCOUNTER — Other Ambulatory Visit: Payer: Self-pay

## 2021-03-27 MED ORDER — FREESTYLE LITE TEST VI STRP: ORAL_STRIP | 1 refills | Status: DC

## 2021-03-27 MED ORDER — NOVOFINE PEN NEEDLE 32G X 6 MM MISC: 3 refills | Status: DC

## 2021-03-27 NOTE — Telephone Encounter (Signed)
Pt called in his blood sugars for the week starting from Dec 1st - today the 6th.  204, 174, 352, 350, 163, 164.  The pt states he took 10 units last night of tresiba, the pt was notified that DR. Baird Cancer said to increase by 3 units.  Chart has been updated.

## 2021-04-02 ENCOUNTER — Other Ambulatory Visit: Payer: Self-pay

## 2021-04-02 DIAGNOSIS — Z79890 Hormone replacement therapy (postmenopausal): Secondary | ICD-10-CM | POA: Diagnosis not present

## 2021-04-02 DIAGNOSIS — Z7951 Long term (current) use of inhaled steroids: Secondary | ICD-10-CM | POA: Diagnosis not present

## 2021-04-02 DIAGNOSIS — Z87891 Personal history of nicotine dependence: Secondary | ICD-10-CM | POA: Diagnosis not present

## 2021-04-02 DIAGNOSIS — Z8546 Personal history of malignant neoplasm of prostate: Secondary | ICD-10-CM | POA: Diagnosis not present

## 2021-04-02 DIAGNOSIS — Z7982 Long term (current) use of aspirin: Secondary | ICD-10-CM | POA: Diagnosis not present

## 2021-04-02 DIAGNOSIS — M542 Cervicalgia: Secondary | ICD-10-CM | POA: Diagnosis not present

## 2021-04-02 DIAGNOSIS — R7989 Other specified abnormal findings of blood chemistry: Secondary | ICD-10-CM | POA: Diagnosis not present

## 2021-04-02 DIAGNOSIS — I4891 Unspecified atrial fibrillation: Secondary | ICD-10-CM | POA: Diagnosis not present

## 2021-04-02 DIAGNOSIS — Z7901 Long term (current) use of anticoagulants: Secondary | ICD-10-CM | POA: Diagnosis not present

## 2021-04-02 DIAGNOSIS — Z79899 Other long term (current) drug therapy: Secondary | ICD-10-CM | POA: Diagnosis not present

## 2021-04-02 DIAGNOSIS — E119 Type 2 diabetes mellitus without complications: Secondary | ICD-10-CM | POA: Diagnosis not present

## 2021-04-02 DIAGNOSIS — I10 Essential (primary) hypertension: Secondary | ICD-10-CM | POA: Diagnosis not present

## 2021-04-02 DIAGNOSIS — R221 Localized swelling, mass and lump, neck: Secondary | ICD-10-CM | POA: Diagnosis not present

## 2021-04-02 DIAGNOSIS — E785 Hyperlipidemia, unspecified: Secondary | ICD-10-CM | POA: Diagnosis not present

## 2021-04-02 NOTE — Telephone Encounter (Signed)
The pt was notified that his Franklin Thornton is being changed to the [place] the preferred med from the pt's insurance. The pt was notified to go to the urgent care because his lymph nodes are swollen making it difficult for him to breath. The pt scheduled an appt for this week, he said that he needed a referral to see if he has fluid on his brain that could be causing him to feel tired and stagger.

## 2021-04-03 DIAGNOSIS — R2689 Other abnormalities of gait and mobility: Secondary | ICD-10-CM | POA: Diagnosis not present

## 2021-04-03 DIAGNOSIS — R29898 Other symptoms and signs involving the musculoskeletal system: Secondary | ICD-10-CM | POA: Diagnosis not present

## 2021-04-04 ENCOUNTER — Ambulatory Visit (INDEPENDENT_AMBULATORY_CARE_PROVIDER_SITE_OTHER): Payer: Medicare Other | Admitting: Nurse Practitioner

## 2021-04-04 ENCOUNTER — Encounter: Payer: Self-pay | Admitting: Nurse Practitioner

## 2021-04-04 ENCOUNTER — Other Ambulatory Visit: Payer: Self-pay

## 2021-04-04 VITALS — BP 130/76 | HR 76 | Temp 97.9°F | Ht 70.0 in | Wt 211.4 lb

## 2021-04-04 DIAGNOSIS — Z6830 Body mass index (BMI) 30.0-30.9, adult: Secondary | ICD-10-CM | POA: Diagnosis not present

## 2021-04-04 DIAGNOSIS — R221 Localized swelling, mass and lump, neck: Secondary | ICD-10-CM

## 2021-04-04 DIAGNOSIS — E6609 Other obesity due to excess calories: Secondary | ICD-10-CM

## 2021-04-04 DIAGNOSIS — R296 Repeated falls: Secondary | ICD-10-CM

## 2021-04-04 DIAGNOSIS — E039 Hypothyroidism, unspecified: Secondary | ICD-10-CM

## 2021-04-04 DIAGNOSIS — R531 Weakness: Secondary | ICD-10-CM | POA: Diagnosis not present

## 2021-04-04 NOTE — Progress Notes (Signed)
I,Tianna Badgett,acting as a Education administrator for [employer], NP.,have documented all relevant documentation on the behalf of [employer], NP,as directed by  Bary Castilla, NP while in the presence of Bary Castilla, NP.  This visit occurred during the SARS-CoV-2 public health emergency.  Safety protocols were in place, including screening questions prior to the visit, additional usage of staff PPE, and extensive cleaning of exam room while observing appropriate contact time as indicated for disinfecting solutions.  Subjective:     Patient ID: Franklin Thornton , male    DOB: 1946-02-10 , 74 y.o.   MRN: [id number]   Chief Complaint  Patient presents with   Mass    HPI  Patient is here for neck mass. He also has complaints of weakness and balance issues. Pt states that he has fallen about 4 times within the last month. He sees the endocrinologist tomorrow. He as recently in the ED for the neck mass. CT was done for the neck mass and was generally unremarkable. Patient would like to see a neurologist in [place], [place]. Denies chest pain, numbness, shortness of breath, speech difficulties.     Past Medical History:  Diagnosis Date   Atrial fibrillation ([place])    Diabetes mellitus without complication ([place])    Peripheral vascular disease ([place])    Prostate cancer ([place])    Uvular edema 07/17/2015     Family History  Problem Relation Age of Onset   Lung cancer Mother    Diabetes Mother    Esophageal cancer Father    Hypertension Sister    Breast cancer Sister    Healthy Son    Healthy Daughter      Current Outpatient Medications:    albuterol (PROVENTIL HFA;VENTOLIN HFA) 108 (90 Base) MCG/ACT inhaler, Inhale 2 puffs into the lungs every 6 (six) hours as needed for wheezing or shortness of breath., Disp: , Rfl:    albuterol (PROVENTIL) (2.5 MG/3ML) 0.083% nebulizer solution, Take 3 mLs (2.5 mg total) by nebulization every 6 (six) hours as needed for wheezing or shortness of  breath., Disp: 75 mL, Rfl: 3   amiodarone (PACERONE) 100 MG tablet, Take 100 mg by mouth daily. , Disp: , Rfl:    amphetamine-dextroamphetamine (ADDERALL) 10 MG tablet, Take 1 tablet by mouth daily. Take 2 tablets in the morning and one tablet in the evenings., Disp: , Rfl:    Armodafinil 250 MG tablet, Take 125-250 mg by mouth every morning., Disp: , Rfl:    aspirin 81 MG tablet, Take 81 mg by mouth daily., Disp: , Rfl:    atorvastatin (LIPITOR) 20 MG tablet, TAKE 1 TABLET DAILY, Disp: 90 tablet, Rfl: 3   azelastine (ASTELIN) 0.1 % nasal spray, Place 1 spray into both nostrils 2 (two) times daily. Use in each nostril as directed, Disp: 30 mL, Rfl: 12   b complex vitamins capsule, Take 1 capsule by mouth daily., Disp: , Rfl:    benzonatate (TESSALON PERLES) 100 MG capsule, Take 1 capsule (100 mg total) by mouth 3 (three) times daily as needed for cough., Disp: 30 capsule, Rfl: 0   carvedilol (COREG) 6.25 MG tablet, Take 6.25 mg by mouth 2 (two) times daily with a meal., Disp: , Rfl:    Cholecalciferol (VITAMIN D) 2000 units tablet, Take 2,000 Units by mouth daily., Disp: , Rfl:    diphenhydrAMINE (BENADRYL) 25 MG tablet, Take 25 mg by mouth every 6 (six) hours as needed for itching. , Disp: , Rfl:  donepezil (ARICEPT) 10 MG tablet, Take 1 tablet (10 mg total) by mouth at bedtime., Disp: 90 tablet, Rfl: 3   DULoxetine (CYMBALTA) 30 MG capsule, TAKE 1 CAPSULE DAILY IN THE EVENING, Disp: 90 capsule, Rfl: 3   furosemide (LASIX) 20 MG tablet, Take 20 mg by mouth daily as needed. , Disp: , Rfl:    gabapentin (NEURONTIN) 600 MG tablet, TAKE 1 TABLET THREE TIMES A DAY, Disp: 270 tablet, Rfl: 3   glucose blood (FREESTYLE LITE) test strip, Use as instructed to check blood sugars  3 times per day dx: e11.65, Disp: 300 each, Rfl: 1   glucose monitoring kit (FREESTYLE) monitoring kit, 1 each by Does not apply route as needed for other., Disp: , Rfl:    HYDROcodone-acetaminophen (NORCO/VICODIN) 5-325 MG  tablet, Take 1 tablet by mouth every 6 (six) hours as needed for moderate pain., Disp: , Rfl:    insulin glargine, 2 Unit Dial, (TOUJEO MAX SOLOSTAR) 300 UNIT/ML Solostar Pen, Inject 13 Units into the skin at bedtime., Disp: , Rfl:    Insulin Pen Needle (NOVOFINE PEN NEEDLE) 32G X 6 MM MISC, Use with insulin pens, Disp: 300 each, Rfl: 3   ipratropium (ATROVENT) 0.03 % nasal spray, Place 2 sprays into both nostrils every 12 (twelve) hours., Disp: , Rfl:    JARDIANCE 10 MG TABS tablet, TAKE 1 TABLET DAILY, Disp: 90 tablet, Rfl: 3   levocetirizine (XYZAL) 5 MG tablet, Take 5 mg by mouth every evening. , Disp: , Rfl:    lidocaine (LIDODERM) 5 %, Place 3 patches onto the skin daily. May use 1-3 patches at one time, Remove & Discard patch within 12 hours or as directed by MD, Disp: 90 patch, Rfl: 0   losartan (COZAAR) 50 MG tablet, Take 50 mg by mouth 2 (two) times daily. , Disp: , Rfl:    Magnesium 250 MG TABS, Take 1 tablet by mouth daily., Disp: , Rfl:    meloxicam (MOBIC) 15 MG tablet, Take 15 mg by mouth daily., Disp: , Rfl:    methocarbamol (ROBAXIN) 750 MG tablet, Take 750 mg by mouth 3 (three) times daily as needed for muscle spasms. , Disp: , Rfl:    mirabegron ER (MYRBETRIQ) 50 MG TB24 tablet, Take 50 mg by mouth daily., Disp: , Rfl:    montelukast (SINGULAIR) 10 MG tablet, Take 10 mg by mouth at bedtime., Disp: , Rfl:    Multiple Vitamin (MULTIVITAMIN) tablet, Take 1 tablet by mouth daily., Disp: , Rfl:    nitroGLYCERIN (NITROSTAT) 0.4 MG SL tablet, Place 0.4 mg under the tongue every 5 (five) minutes as needed for chest pain. , Disp: , Rfl:    Omega-3 Fatty Acids (FISH OIL) 1200 MG CAPS, Take 1,200 mg by mouth. , Disp: , Rfl:    pantoprazole (PROTONIX) 40 MG tablet, TAKE 1 TABLET DAILY, Disp: 90 tablet, Rfl: 3   pramipexole (MIRAPEX) 0.5 MG tablet, Take 2 tablets (1 mg total) by mouth in the morning and at bedtime. (Patient taking differently: Take 1 mg by mouth in the morning and at bedtime.  One in the morning one in the afternoon and 2 at night.), Disp: 90 tablet, Rfl: 1   rivaroxaban (XARELTO) 10 MG TABS tablet, Take 10 mg by mouth daily., Disp: , Rfl:    Semaglutide, 1 MG/DOSE, (OZEMPIC, 1 MG/DOSE,) 4 MG/3ML SOPN, Inject 1 mg into the skin once a week., Disp: 9 mL, Rfl: 1   solifenacin (VESICARE) 5 MG tablet, Take 1 tablet (5  mg total) by mouth daily., Disp: 90 tablet, Rfl: 1   SYNTHROID 50 MCG tablet, TAKE 1 TABLET DAILY, Disp: 90 tablet, Rfl: 3   vitamin B-12 (CYANOCOBALAMIN) 1000 MCG tablet, Take 1,000 mcg by mouth daily., Disp: , Rfl:    WIXELA INHUB 100-50 MCG/ACT AEPB, Inhale 1 puff into the lungs 2 (two) times daily., Disp: 60 each, Rfl: 1  Current Facility-Administered Medications:    cyanocobalamin ((VITAMIN B-12)) injection 1,000 mcg, 1,000 mcg, Intramuscular, Once, [name], MD   Allergies  Allergen Reactions   No Known Allergies      Review of Systems  Constitutional: Negative.  Negative for chills, fatigue and fever.  HENT: Negative.  Negative for congestion, rhinorrhea, sinus pressure and sneezing.   Eyes: Negative.   Respiratory: Negative.  Negative for cough, shortness of breath and wheezing.   Cardiovascular: Negative.  Negative for chest pain and palpitations.  Gastrointestinal: Negative.   Endocrine: Negative.   Genitourinary: Negative.   Musculoskeletal: Negative.   Skin: Negative.   Allergic/Immunologic: Negative.   Neurological:  Positive for weakness. Negative for dizziness, speech difficulty, numbness and headaches.  Hematological: Negative.   Psychiatric/Behavioral: Negative.      Today's Vitals   04/04/21 1125  BP: 130/76  Pulse: 76  Temp: 97.9 F (36.6 C)  TempSrc: Oral  Weight: 211 lb 6.4 oz (95.9 kg)  Height: 5' 10" (1.778 m)   Body mass index is 30.33 kg/m.  Wt Readings from Last 3 Encounters:  04/04/21 211 lb 6.4 oz (95.9 kg)  03/13/21 211 lb (95.7 kg)  03/08/21 222 lb 9.6 oz (101 kg)    Objective:  Physical  Exam Constitutional:      Appearance: Normal appearance.  HENT:     Head: Normocephalic and atraumatic.  Neck:     Comments: Edema around the neck  Cardiovascular:     Rate and Rhythm: Normal rate and regular rhythm.     Pulses: Normal pulses.     Heart sounds: Normal heart sounds. No murmur heard. Pulmonary:     Effort: Pulmonary effort is normal. No respiratory distress.     Breath sounds: Normal breath sounds. No wheezing.  Musculoskeletal:     Cervical back: Normal range of motion. Edema present.  Skin:    Capillary Refill: Capillary refill takes less than 2 seconds.  Neurological:     Mental Status: He is alert and oriented to person, place, and time.        Assessment And Plan:     1. Neck mass -Pt. Recently went to the ED. Reviewed labs and CT of neck with patient.  -Will check thyroid panel   2. Generalized weakness - Ambulatory referral to Neurology  3. Frequent falls -Reviewed recent labs with patient.  - Ambulatory referral to Neurology  4. Acquired hypothyroidism -Hx of Thyroidectomy  -Advised pt. To follow up with endocrinologist.  - TSH + free T4  5. Class 1 obesity due to excess calories with serious comorbidity and body mass index (BMI) of 30.0 to 30.9 in adult Advised patient on a healthy diet including avoiding fast food and red meats. Increase the intake of lean meats including grilled chicken and [place].  Drink a lot of water. Decrease intake of fatty foods. Exercise for 30-45 min. 4-5 a week to decrease the risk of cardiac event.    Wt Readings from Last 3 Encounters:  04/04/21 211 lb 6.4 oz (95.9 kg)  03/13/21 211 lb (95.7 kg)  03/08/21 222 lb 9.6 oz (  101 kg)   The patient was encouraged to call or send a message through MyChart for any questions or concerns.   Follow up: if symptoms persist or do not get better.   Side effects and appropriate use of all the medication(s) were discussed with the patient today. Patient advised to use the  medication(s) as directed by their healthcare provider. The patient was encouraged to read, review, and understand all associated package inserts and contact our office with any questions or concerns. The patient accepts the risks of the treatment plan and had an opportunity to ask questions.   Patient was given opportunity to ask questions. Patient verbalized understanding of the plan and was able to repeat key elements of the plan. All questions were answered to their satisfaction.  Raman Jamirra Curnow, DNP   I, Raman Journei Thomassen have reviewed all documentation for this visit. The documentation on 04/04/21 for the exam, diagnosis, procedures, and orders are all accurate and complete.    IF YOU HAVE BEEN REFERRED TO A SPECIALIST, IT MAY TAKE 1-2 WEEKS TO SCHEDULE/PROCESS THE REFERRAL. IF YOU HAVE NOT HEARD FROM US/SPECIALIST IN TWO WEEKS, PLEASE GIVE [place] A CALL AT [phone].   THE PATIENT IS ENCOURAGED TO PRACTICE SOCIAL DISTANCING DUE TO THE COVID-19 PANDEMIC.

## 2021-04-05 DIAGNOSIS — R262 Difficulty in walking, not elsewhere classified: Secondary | ICD-10-CM | POA: Diagnosis not present

## 2021-04-05 DIAGNOSIS — E1169 Type 2 diabetes mellitus with other specified complication: Secondary | ICD-10-CM | POA: Diagnosis not present

## 2021-04-05 DIAGNOSIS — R531 Weakness: Secondary | ICD-10-CM | POA: Diagnosis not present

## 2021-04-05 DIAGNOSIS — E785 Hyperlipidemia, unspecified: Secondary | ICD-10-CM | POA: Diagnosis not present

## 2021-04-05 DIAGNOSIS — L84 Corns and callosities: Secondary | ICD-10-CM | POA: Diagnosis not present

## 2021-04-05 DIAGNOSIS — E1140 Type 2 diabetes mellitus with diabetic neuropathy, unspecified: Secondary | ICD-10-CM | POA: Diagnosis not present

## 2021-04-05 DIAGNOSIS — Z794 Long term (current) use of insulin: Secondary | ICD-10-CM | POA: Diagnosis not present

## 2021-04-05 DIAGNOSIS — I152 Hypertension secondary to endocrine disorders: Secondary | ICD-10-CM | POA: Diagnosis not present

## 2021-04-05 DIAGNOSIS — E1159 Type 2 diabetes mellitus with other circulatory complications: Secondary | ICD-10-CM | POA: Diagnosis not present

## 2021-04-05 LAB — TSH+FREE T4
Free T4: 1.72 ng/dL (ref 0.82–1.77)
TSH: 1.7 u[IU]/mL (ref 0.450–4.500)

## 2021-04-10 DIAGNOSIS — R262 Difficulty in walking, not elsewhere classified: Secondary | ICD-10-CM | POA: Diagnosis not present

## 2021-04-10 DIAGNOSIS — R531 Weakness: Secondary | ICD-10-CM | POA: Diagnosis not present

## 2021-04-12 DIAGNOSIS — R262 Difficulty in walking, not elsewhere classified: Secondary | ICD-10-CM | POA: Diagnosis not present

## 2021-04-12 DIAGNOSIS — R531 Weakness: Secondary | ICD-10-CM | POA: Diagnosis not present

## 2021-04-19 DIAGNOSIS — E1140 Type 2 diabetes mellitus with diabetic neuropathy, unspecified: Secondary | ICD-10-CM | POA: Diagnosis not present

## 2021-04-19 DIAGNOSIS — E785 Hyperlipidemia, unspecified: Secondary | ICD-10-CM | POA: Diagnosis not present

## 2021-04-19 DIAGNOSIS — E1159 Type 2 diabetes mellitus with other circulatory complications: Secondary | ICD-10-CM | POA: Diagnosis not present

## 2021-04-19 DIAGNOSIS — Z794 Long term (current) use of insulin: Secondary | ICD-10-CM | POA: Diagnosis not present

## 2021-04-19 DIAGNOSIS — E1169 Type 2 diabetes mellitus with other specified complication: Secondary | ICD-10-CM | POA: Diagnosis not present

## 2021-04-19 DIAGNOSIS — I152 Hypertension secondary to endocrine disorders: Secondary | ICD-10-CM | POA: Diagnosis not present

## 2021-04-24 ENCOUNTER — Telehealth: Payer: Self-pay

## 2021-04-24 DIAGNOSIS — Z7952 Long term (current) use of systemic steroids: Secondary | ICD-10-CM | POA: Diagnosis not present

## 2021-04-24 DIAGNOSIS — M351 Other overlap syndromes: Secondary | ICD-10-CM | POA: Diagnosis not present

## 2021-04-24 DIAGNOSIS — M150 Primary generalized (osteo)arthritis: Secondary | ICD-10-CM | POA: Diagnosis not present

## 2021-04-24 DIAGNOSIS — Z791 Long term (current) use of non-steroidal anti-inflammatories (NSAID): Secondary | ICD-10-CM | POA: Diagnosis not present

## 2021-04-24 NOTE — Progress Notes (Signed)
° ° °  Chronic Care Management Pharmacy Assistant   Name: [name]  MRN: [id number] DOB: [birth year]  Reason for Encounter: Called for reminder of appointment on 04/25/2021 at 11:30 am confirmed appointment with patient.     [place] Pharmacist Assistant [phone]

## 2021-04-25 ENCOUNTER — Telehealth: Payer: Medicare Other

## 2021-04-25 DIAGNOSIS — G8929 Other chronic pain: Secondary | ICD-10-CM | POA: Diagnosis not present

## 2021-04-25 DIAGNOSIS — Z9181 History of falling: Secondary | ICD-10-CM | POA: Diagnosis not present

## 2021-04-25 DIAGNOSIS — R262 Difficulty in walking, not elsewhere classified: Secondary | ICD-10-CM | POA: Diagnosis not present

## 2021-04-25 DIAGNOSIS — M6281 Muscle weakness (generalized): Secondary | ICD-10-CM | POA: Diagnosis not present

## 2021-04-30 DIAGNOSIS — Z9181 History of falling: Secondary | ICD-10-CM | POA: Diagnosis not present

## 2021-04-30 DIAGNOSIS — M6281 Muscle weakness (generalized): Secondary | ICD-10-CM | POA: Diagnosis not present

## 2021-04-30 DIAGNOSIS — G8929 Other chronic pain: Secondary | ICD-10-CM | POA: Diagnosis not present

## 2021-04-30 DIAGNOSIS — R262 Difficulty in walking, not elsewhere classified: Secondary | ICD-10-CM | POA: Diagnosis not present

## 2021-05-03 ENCOUNTER — Ambulatory Visit: Payer: Medicare Other | Admitting: Internal Medicine

## 2021-05-08 DIAGNOSIS — I482 Chronic atrial fibrillation, unspecified: Secondary | ICD-10-CM | POA: Diagnosis not present

## 2021-05-08 DIAGNOSIS — Z9981 Dependence on supplemental oxygen: Secondary | ICD-10-CM | POA: Diagnosis not present

## 2021-05-08 DIAGNOSIS — J441 Chronic obstructive pulmonary disease with (acute) exacerbation: Secondary | ICD-10-CM | POA: Diagnosis not present

## 2021-05-08 DIAGNOSIS — Z6831 Body mass index (BMI) 31.0-31.9, adult: Secondary | ICD-10-CM | POA: Diagnosis not present

## 2021-05-08 DIAGNOSIS — E785 Hyperlipidemia, unspecified: Secondary | ICD-10-CM | POA: Diagnosis not present

## 2021-05-08 DIAGNOSIS — J189 Pneumonia, unspecified organism: Secondary | ICD-10-CM | POA: Diagnosis not present

## 2021-05-08 DIAGNOSIS — R0602 Shortness of breath: Secondary | ICD-10-CM | POA: Diagnosis not present

## 2021-05-08 DIAGNOSIS — R918 Other nonspecific abnormal finding of lung field: Secondary | ICD-10-CM | POA: Diagnosis not present

## 2021-05-08 DIAGNOSIS — J440 Chronic obstructive pulmonary disease with acute lower respiratory infection: Secondary | ICD-10-CM | POA: Diagnosis not present

## 2021-05-08 DIAGNOSIS — Z794 Long term (current) use of insulin: Secondary | ICD-10-CM | POA: Diagnosis not present

## 2021-05-08 DIAGNOSIS — Z20822 Contact with and (suspected) exposure to covid-19: Secondary | ICD-10-CM | POA: Diagnosis not present

## 2021-05-08 DIAGNOSIS — G4733 Obstructive sleep apnea (adult) (pediatric): Secondary | ICD-10-CM | POA: Diagnosis not present

## 2021-05-08 DIAGNOSIS — R000 Tachycardia, unspecified: Secondary | ICD-10-CM | POA: Diagnosis not present

## 2021-05-08 DIAGNOSIS — E782 Mixed hyperlipidemia: Secondary | ICD-10-CM | POA: Diagnosis not present

## 2021-05-08 DIAGNOSIS — J168 Pneumonia due to other specified infectious organisms: Secondary | ICD-10-CM | POA: Diagnosis not present

## 2021-05-08 DIAGNOSIS — Z8546 Personal history of malignant neoplasm of prostate: Secondary | ICD-10-CM | POA: Diagnosis not present

## 2021-05-08 DIAGNOSIS — J9611 Chronic respiratory failure with hypoxia: Secondary | ICD-10-CM | POA: Diagnosis not present

## 2021-05-08 DIAGNOSIS — I517 Cardiomegaly: Secondary | ICD-10-CM | POA: Diagnosis not present

## 2021-05-08 DIAGNOSIS — Z7901 Long term (current) use of anticoagulants: Secondary | ICD-10-CM | POA: Diagnosis not present

## 2021-05-08 DIAGNOSIS — Z7982 Long term (current) use of aspirin: Secondary | ICD-10-CM | POA: Diagnosis not present

## 2021-05-08 DIAGNOSIS — B348 Other viral infections of unspecified site: Secondary | ICD-10-CM | POA: Diagnosis not present

## 2021-05-08 DIAGNOSIS — Z9181 History of falling: Secondary | ICD-10-CM | POA: Diagnosis not present

## 2021-05-08 DIAGNOSIS — Z7902 Long term (current) use of antithrombotics/antiplatelets: Secondary | ICD-10-CM | POA: Diagnosis not present

## 2021-05-08 DIAGNOSIS — Z79890 Hormone replacement therapy (postmenopausal): Secondary | ICD-10-CM | POA: Diagnosis not present

## 2021-05-08 DIAGNOSIS — I10 Essential (primary) hypertension: Secondary | ICD-10-CM | POA: Diagnosis not present

## 2021-05-08 DIAGNOSIS — I4891 Unspecified atrial fibrillation: Secondary | ICD-10-CM | POA: Diagnosis not present

## 2021-05-08 DIAGNOSIS — I2510 Atherosclerotic heart disease of native coronary artery without angina pectoris: Secondary | ICD-10-CM | POA: Diagnosis not present

## 2021-05-08 DIAGNOSIS — Z87891 Personal history of nicotine dependence: Secondary | ICD-10-CM | POA: Diagnosis not present

## 2021-05-08 DIAGNOSIS — E669 Obesity, unspecified: Secondary | ICD-10-CM | POA: Diagnosis not present

## 2021-05-08 DIAGNOSIS — J90 Pleural effusion, not elsewhere classified: Secondary | ICD-10-CM | POA: Diagnosis not present

## 2021-05-08 DIAGNOSIS — Z9089 Acquired absence of other organs: Secondary | ICD-10-CM | POA: Diagnosis not present

## 2021-05-15 LAB — CBC AND DIFFERENTIAL: Hemoglobin: 7.9 — AB (ref 13.5–17.5)

## 2021-05-23 ENCOUNTER — Other Ambulatory Visit: Payer: Self-pay

## 2021-05-23 ENCOUNTER — Telehealth: Payer: Self-pay

## 2021-05-23 ENCOUNTER — Ambulatory Visit (INDEPENDENT_AMBULATORY_CARE_PROVIDER_SITE_OTHER): Payer: Medicare Other | Admitting: Internal Medicine

## 2021-05-23 ENCOUNTER — Encounter: Payer: Self-pay | Admitting: Internal Medicine

## 2021-05-23 VITALS — BP 114/72 | Temp 97.7°F | Ht 70.0 in | Wt 204.6 lb

## 2021-05-23 DIAGNOSIS — R0609 Other forms of dyspnea: Secondary | ICD-10-CM | POA: Diagnosis not present

## 2021-05-23 DIAGNOSIS — Z6829 Body mass index (BMI) 29.0-29.9, adult: Secondary | ICD-10-CM

## 2021-05-23 DIAGNOSIS — I1310 Hypertensive heart and chronic kidney disease without heart failure, with stage 1 through stage 4 chronic kidney disease, or unspecified chronic kidney disease: Secondary | ICD-10-CM

## 2021-05-23 DIAGNOSIS — E1122 Type 2 diabetes mellitus with diabetic chronic kidney disease: Secondary | ICD-10-CM | POA: Diagnosis not present

## 2021-05-23 DIAGNOSIS — N182 Chronic kidney disease, stage 2 (mild): Secondary | ICD-10-CM | POA: Diagnosis not present

## 2021-05-23 DIAGNOSIS — I700 Atherosclerosis of aorta: Secondary | ICD-10-CM

## 2021-05-23 DIAGNOSIS — R2689 Other abnormalities of gait and mobility: Secondary | ICD-10-CM | POA: Diagnosis not present

## 2021-05-23 NOTE — Progress Notes (Signed)
[name],acting as a Education administrator for [name], MD.,have documented all relevant documentation on the behalf of [name], MD,as directed by  [name], MD while in the presence of [name], MD.  This visit occurred during the SARS-CoV-2 public health emergency.  Safety protocols were in place, including screening questions prior to the visit, additional usage of staff PPE, and extensive cleaning of exam room while observing appropriate contact time as indicated for disinfecting solutions.  Subjective:     Patient ID: Franklin Thornton , male    DOB: Mar 27, 1946 , 76 y.o.   MRN: [id number]   Chief Complaint  Patient presents with   Diabetes   Hypertension    HPI  Patient presents today for a diabetes and blood pressure check.  Patient reports his blood sugars are really high, sometimes in the 400s.  He is now followed by Endocrine at [facility], he has f/u appt scheduled for tomorrow.   Diabetes He presents for his follow-up diabetic visit. He has type 2 diabetes mellitus. His disease course has been improving. Pertinent negatives for hypoglycemia include no dizziness or headaches. Pertinent negatives for diabetes include no blurred vision, no chest pain, no fatigue, no polydipsia, no polyphagia and no polyuria. There are no hypoglycemic complications. Risk factors for coronary artery disease include diabetes mellitus, dyslipidemia, hypertension, male sex, obesity and sedentary lifestyle. His breakfast blood glucose is taken between 9-10 am. His breakfast blood glucose range is generally 90-110 mg/dl. An ACE inhibitor/angiotensin II receptor blocker is being taken. Eye exam is current.  Hypertension This is a chronic problem. The current episode started more than 1 year ago. Pertinent negatives include no blurred vision, chest pain, headaches, palpitations or shortness of breath.    Past Medical History:  Diagnosis Date   Atrial fibrillation ([place])    Diabetes  mellitus without complication ([place])    Peripheral vascular disease ([place])    Prostate cancer ([place])    Uvular edema 07/17/2015     Family History  Problem Relation Age of Onset   Lung cancer Mother    Diabetes Mother    Esophageal cancer Father    Hypertension Sister    Breast cancer Sister    Healthy Son    Healthy Daughter      Current Outpatient Medications:    albuterol (PROVENTIL HFA;VENTOLIN HFA) 108 (90 Base) MCG/ACT inhaler, Inhale 2 puffs into the lungs every 6 (six) hours as needed for wheezing or shortness of breath., Disp: , Rfl:    albuterol (PROVENTIL) (2.5 MG/3ML) 0.083% nebulizer solution, Take 3 mLs (2.5 mg total) by nebulization every 6 (six) hours as needed for wheezing or shortness of breath., Disp: 75 mL, Rfl: 3   amiodarone (PACERONE) 100 MG tablet, Take 100 mg by mouth daily. , Disp: , Rfl:    amphetamine-dextroamphetamine (ADDERALL) 10 MG tablet, Take 1 tablet by mouth daily. Take 2 tablets in the morning and one tablet in the evenings., Disp: , Rfl:    Armodafinil 250 MG tablet, Take 125-250 mg by mouth every morning., Disp: , Rfl:    aspirin 81 MG tablet, Take 81 mg by mouth daily., Disp: , Rfl:    atorvastatin (LIPITOR) 20 MG tablet, TAKE 1 TABLET DAILY, Disp: 90 tablet, Rfl: 3   azelastine (ASTELIN) 0.1 % nasal spray, Place 1 spray into both nostrils 2 (two) times daily. Use in each nostril as directed, Disp: 30 mL, Rfl: 12   b complex vitamins capsule, Take 1  capsule by mouth daily., Disp: , Rfl:    benzonatate (TESSALON PERLES) 100 MG capsule, Take 1 capsule (100 mg total) by mouth 3 (three) times daily as needed for cough., Disp: 30 capsule, Rfl: 0   carvedilol (COREG) 6.25 MG tablet, Take 6.25 mg by mouth 2 (two) times daily with a meal., Disp: , Rfl:    Cholecalciferol (VITAMIN D) 2000 units tablet, Take 2,000 Units by mouth daily., Disp: , Rfl:    diphenhydrAMINE (BENADRYL) 25 MG tablet, Take 25 mg by mouth every 6 (six) hours as  needed for itching. , Disp: , Rfl:    donepezil (ARICEPT) 10 MG tablet, Take 1 tablet (10 mg total) by mouth at bedtime., Disp: 90 tablet, Rfl: 3   DULoxetine (CYMBALTA) 30 MG capsule, TAKE 1 CAPSULE DAILY IN THE EVENING, Disp: 90 capsule, Rfl: 3   furosemide (LASIX) 20 MG tablet, Take 20 mg by mouth daily as needed. , Disp: , Rfl:    glucose blood (FREESTYLE LITE) test strip, Use as instructed to check blood sugars  3 times per day dx: e11.65, Disp: 300 each, Rfl: 1   glucose monitoring kit (FREESTYLE) monitoring kit, 1 each by Does not apply route as needed for other., Disp: , Rfl:    HYDROcodone-acetaminophen (NORCO/VICODIN) 5-325 MG tablet, Take 1 tablet by mouth every 6 (six) hours as needed for moderate pain., Disp: , Rfl:    insulin glargine, 2 Unit Dial, (TOUJEO MAX SOLOSTAR) 300 UNIT/ML Solostar Pen, Inject 13 Units into the skin at bedtime., Disp: , Rfl:    Insulin Pen Needle (NOVOFINE PEN NEEDLE) 32G X 6 MM MISC, Use with insulin pens, Disp: 300 each, Rfl: 3   ipratropium (ATROVENT) 0.03 % nasal spray, Place 2 sprays into both nostrils every 12 (twelve) hours., Disp: , Rfl:    JARDIANCE 10 MG TABS tablet, TAKE 1 TABLET DAILY, Disp: 90 tablet, Rfl: 3   levocetirizine (XYZAL) 5 MG tablet, Take 5 mg by mouth every evening. , Disp: , Rfl:    lidocaine (LIDODERM) 5 %, Place 3 patches onto the skin daily. May use 1-3 patches at one time, Remove & Discard patch within 12 hours or as directed by MD, Disp: 90 patch, Rfl: 0   losartan (COZAAR) 50 MG tablet, Take 50 mg by mouth 2 (two) times daily. , Disp: , Rfl:    Magnesium 250 MG TABS, Take 1 tablet by mouth daily., Disp: , Rfl:    meloxicam (MOBIC) 15 MG tablet, Take 15 mg by mouth daily., Disp: , Rfl:    methocarbamol (ROBAXIN) 750 MG tablet, Take 750 mg by mouth 3 (three) times daily as needed for muscle spasms. , Disp: , Rfl:    mirabegron ER (MYRBETRIQ) 50 MG TB24 tablet, Take 50 mg by mouth daily., Disp: , Rfl:     montelukast (SINGULAIR) 10 MG tablet, Take 10 mg by mouth at bedtime., Disp: , Rfl:    Multiple Vitamin (MULTIVITAMIN) tablet, Take 1 tablet by mouth daily., Disp: , Rfl:    nitroGLYCERIN (NITROSTAT) 0.4 MG SL tablet, Place 0.4 mg under the tongue every 5 (five) minutes as needed for chest pain. , Disp: , Rfl:    Omega-3 Fatty Acids (FISH OIL) 1200 MG CAPS, Take 1,200 mg by mouth. , Disp: , Rfl:    pantoprazole (PROTONIX) 40 MG tablet, TAKE 1 TABLET DAILY, Disp: 90 tablet, Rfl: 3   pramipexole (MIRAPEX) 0.5 MG tablet, Take 2 tablets (1 mg total) by mouth in the morning and at  bedtime. (Patient taking differently: Take 1 mg by mouth in the morning and at bedtime. One in the morning one in the afternoon and 2 at night.), Disp: 90 tablet, Rfl: 1   rivaroxaban (XARELTO) 10 MG TABS tablet, Take 10 mg by mouth daily., Disp: , Rfl:    solifenacin (VESICARE) 5 MG tablet, Take 1 tablet (5 mg total) by mouth daily., Disp: 90 tablet, Rfl: 1   SYNTHROID 50 MCG tablet, TAKE 1 TABLET DAILY, Disp: 90 tablet, Rfl: 3   vitamin B-12 (CYANOCOBALAMIN) 1000 MCG tablet, Take 1,000 mcg by mouth daily., Disp: , Rfl:    WIXELA INHUB 100-50 MCG/ACT AEPB, Inhale 1 puff into the lungs 2 (two) times daily., Disp: 60 each, Rfl: 1   Semaglutide, 1 MG/DOSE, (OZEMPIC, 1 MG/DOSE,) 4 MG/3ML SOPN, Inject 1 mg into the skin once a week. (Patient not taking: Reported on 05/23/2021), Disp: 9 mL, Rfl: 1  Current Facility-Administered Medications:    cyanocobalamin ((VITAMIN B-12)) injection 1,000 mcg, 1,000 mcg, Intramuscular, Once, [name], MD   Allergies  Allergen Reactions   No Known Allergies      Review of Systems  Constitutional: Negative.  Negative for fatigue.  Eyes: Negative.  Negative for blurred vision.  Respiratory: Negative.  Negative for shortness of breath.   Cardiovascular: Negative.  Negative for chest pain and palpitations.  Gastrointestinal: Negative.   Endocrine: Negative for polydipsia,  polyphagia and polyuria.  Musculoskeletal: Negative.   Skin: Negative.   Neurological: Negative.  Negative for dizziness and headaches.  Psychiatric/Behavioral: Negative.      Today's Vitals   05/23/21 1514  BP: 114/72  Temp: 97.7 F (36.5 C)  Weight: 204 lb 9.6 oz (92.8 kg)  Height: _0  (1.778 m)  PainSc: 0-No pain   Body mass index is 29.36 kg/m.  Wt Readings from Last 3 Encounters:  05/23/21 204 lb 9.6 oz (92.8 kg)  04/04/21 211 lb 6.4 oz (95.9 kg)  03/13/21 211 lb (95.7 kg)     Objective:  Physical Exam Vitals and nursing note reviewed.  Constitutional:      Appearance: Normal appearance.  HENT:     Head: Normocephalic and atraumatic.     Nose:     Comments: MASKED     Mouth/Throat:     Comments: MASKED  Eyes:     Extraocular Movements: Extraocular movements intact.  Cardiovascular:     Rate and Rhythm: Normal rate and regular rhythm.     Heart sounds: Normal heart sounds.  Pulmonary:     Effort: Pulmonary effort is normal.     Breath sounds: Normal breath sounds.  Musculoskeletal:     Cervical back: Normal range of motion.     Right lower leg: Edema present.     Left lower leg: Edema present.     Comments: Ambulatory with cane  Skin:    General: Skin is warm.  Neurological:     General: No focal deficit present.     Mental Status: He is alert.  Psychiatric:        Mood and Affect: Mood normal.        Assessment And Plan:     1. Type 2 diabetes mellitus with stage 2 chronic kidney disease, without long-term current use of insulin (HCC) Comments: Chronic, I will check labs as below. Importance of dietary/medication compliance was d/w patient. I will fax labs to his endocrinologist for review.  - CMP14+EGFR - Hemoglobin A1c  2. Hypertensive heart and renal disease with renal  failure, stage 1 through stage 4 or unspecified chronic kidney disease, without heart failure Comments: Chronic, well controlled. No med changes today. He is encouraged to  follow low sodium diet.   3. Atherosclerosis of aorta (HCC) Comments: Chronic, importance of statin compliance was d/w patient. Advised to follow heart healthy lifestyle.   4. DOE (dyspnea on exertion) Comments: Also with LE edema. I will check labs as below.   5. Imbalance Comments: Sx suggestive of hydrocephalus -- frequent falls, urinary frequency, confusion. I will schedule him for an MRI - Vitamin B12  6. BMI 29.0-29.9,adult Comments: His BMI is acceptable for his demographic. He is encouraged to gradually increase daily activity.      Patient was given opportunity to ask questions. Patient verbalized understanding of the plan and was able to repeat key elements of the plan. All questions were answered to their satisfaction.   I, [name], MD, have reviewed all documentation for this visit. The documentation on 05/23/21 for the exam, diagnosis, procedures, and orders are all accurate and complete.   IF YOU HAVE BEEN REFERRED TO A SPECIALIST, IT MAY TAKE 1-2 WEEKS TO SCHEDULE/PROCESS THE REFERRAL. IF YOU HAVE NOT HEARD FROM US/SPECIALIST IN TWO WEEKS, PLEASE GIVE [place] A CALL AT [phone].   THE PATIENT IS ENCOURAGED TO PRACTICE SOCIAL DISTANCING DUE TO THE COVID-19 PANDEMIC.

## 2021-05-23 NOTE — Patient Instructions (Signed)

## 2021-05-23 NOTE — Chronic Care Management (AMB) (Signed)
° ° °  Called Franklin Thornton, No answer, left message of appointment on 05-29-2021 at 10:15 via telephone visit with Orlando Penner, Pharm D. Notified to have all medications, supplements, blood pressure and/or blood sugar logs available during appointment and to return call if need to reschedule.   Care Gaps: Yearly Ophthalmology exam overdue Covid booster overdue Yearly foot exam overdue AWV 01-10-2022  Star Rating Drug: Ozempic 1 mg- Patient assistance Jardiance 10 mg- Last filled 03-11-2021 [facility] Losartan 50 mg- Last filled 12-27-2020 [facility] Atorvastatin 20 mg- Last filled 05-22-2021 [facility]  Any gaps in medications fill history? Yes  [place] Pharmacist Assistant [phone]

## 2021-05-24 DIAGNOSIS — E119 Type 2 diabetes mellitus without complications: Secondary | ICD-10-CM | POA: Diagnosis not present

## 2021-05-24 LAB — CMP14+EGFR
ALT: 29 IU/L (ref 0–44)
AST: 20 IU/L (ref 0–40)
Albumin/Globulin Ratio: 1.5 (ref 1.2–2.2)
Albumin: 3.7 g/dL (ref 3.7–4.7)
Alkaline Phosphatase: 81 IU/L (ref 44–121)
BUN/Creatinine Ratio: 40 — ABNORMAL HIGH (ref 10–24)
BUN: 29 mg/dL — ABNORMAL HIGH (ref 8–27)
Bilirubin Total: 0.4 mg/dL (ref 0.0–1.2)
CO2: 30 mmol/L — ABNORMAL HIGH (ref 20–29)
Calcium: 9.6 mg/dL (ref 8.6–10.2)
Chloride: 100 mmol/L (ref 96–106)
Creatinine, Ser: 0.72 mg/dL — ABNORMAL LOW (ref 0.76–1.27)
Globulin, Total: 2.5 g/dL (ref 1.5–4.5)
Glucose: 155 mg/dL — ABNORMAL HIGH (ref 70–99)
Potassium: 4.6 mmol/L (ref 3.5–5.2)
Sodium: 142 mmol/L (ref 134–144)
Total Protein: 6.2 g/dL (ref 6.0–8.5)
eGFR: 95 mL/min/{1.73_m2} (ref >59)

## 2021-05-24 LAB — HEMOGLOBIN A1C
Est. average glucose Bld gHb Est-mCnc: 174 mg/dL
Hgb A1c MFr Bld: 7.7 % — ABNORMAL HIGH (ref 4.8–5.6)

## 2021-05-24 LAB — VITAMIN B12: Vitamin B-12: 1032 pg/mL (ref 232–1245)

## 2021-05-29 ENCOUNTER — Telehealth: Payer: Self-pay

## 2021-05-29 ENCOUNTER — Ambulatory Visit (INDEPENDENT_AMBULATORY_CARE_PROVIDER_SITE_OTHER): Payer: Medicare Other

## 2021-05-29 DIAGNOSIS — E1122 Type 2 diabetes mellitus with diabetic chronic kidney disease: Secondary | ICD-10-CM

## 2021-05-29 DIAGNOSIS — I10 Essential (primary) hypertension: Secondary | ICD-10-CM

## 2021-05-29 NOTE — Progress Notes (Signed)
Chronic Care Management Pharmacy Note  06/06/2021 Name:  Franklin Thornton MRN:  [id number] DOB:  January 18, 1946  Summary: Patient reports that he is going to a specialist at [facility].   Recommendations/Changes made from today's visit: Recommend patient begin to review the salt content of his food and read labels.   Plan: Patient reports that he is going to start reading labels of food and monitoring his salt more closely.    Subjective: Franklin Thornton is an 76 y.o. year old male who is a primary patient of [name], MD.  The CCM team was consulted for assistance with disease management and care coordination needs.    Engaged with patient by telephone for follow up visit in response to provider referral for pharmacy case management and/or care coordination services. Patient reports that he feels fair but he does not feel that he is getting any better. Patient reports that he is concerned that he has issues with walking and having issues with his health.   Consent to Services:  The patient was given information about Chronic Care Management services, agreed to services, and gave verbal consent prior to initiation of services.  Please see initial visit note for detailed documentation.   Patient Care Team: [name], MD as PCP - General (Internal Medicine) Gardiner Rhyme, MD as Referring Physician (Specialist) Clifton Custard, MD as Referring Physician (Internal Medicine) Warden Fillers, MD as Consulting Physician (Ophthalmology) Adrian Prows, MD as Consulting Physician (Cardiology) Cyril Mourning, [facility] (Inactive) (Pharmacist)  Recent office visits: 05/23/2021 PCP OV  Recent consult visits:  04/19/2021 Endocrinology [facility] visits: 05/08/2021 Hospital OV - Yes   Objective:  Lab Results  Component Value Date   CREATININE 0.72 (L) 05/23/2021   BUN 29 (H) 05/23/2021   EGFR 95 05/23/2021   GFRNONAA 68 03/14/2020   GFRAA 79 03/14/2020   NA 142 05/23/2021    K 4.6 05/23/2021   CALCIUM 9.6 05/23/2021   CO2 30 (H) 05/23/2021   GLUCOSE 155 (H) 05/23/2021    Lab Results  Component Value Date/Time   HGBA1C 7.7 (H) 05/23/2021 04:14 PM   HGBA1C 10.6 (H) 03/08/2021 04:14 PM   MICROALBUR 30 07/30/2018 12:32 PM    Last diabetic Eye exam:  Lab Results  Component Value Date/Time   HMDIABEYEEXA No Retinopathy 04/26/2019 12:00 AM    Last diabetic Foot exam: No results found for: HMDIABFOOTEX   Lab Results  Component Value Date   CHOL 181 07/31/2020   HDL 47 07/31/2020   LDLCALC 85 07/31/2020   TRIG 298 (H) 07/31/2020   CHOLHDL 3.9 07/31/2020    Hepatic Function Latest Ref Rng & Units 05/23/2021 03/08/2021 12/06/2020  Total Protein 6.0 - 8.5 g/dL 6.2 6.3 6.8  Albumin 3.7 - 4.7 g/dL 3.7 3.7 4.1  AST 0 - 40 IU/L $Remov'20 19 25  'rWRjYY$ ALT 0 - 44 IU/L 29 34 26  Alk Phosphatase 44 - 121 IU/L 81 78 68  Total Bilirubin 0.0 - 1.2 mg/dL 0.4 0.5 0.4    Lab Results  Component Value Date/Time   TSH 1.700 04/04/2021 12:01 PM   TSH 0.426 (L) 02/08/2021 11:26 AM   FREET4 1.72 04/04/2021 12:01 PM    CBC Latest Ref Rng & Units 08/22/2020 12/14/2019 09/21/2018  WBC 3.4 - 10.8 x10E3/uL 7.3 6.8 8.0  Hemoglobin 13.0 - 17.7 g/dL 16.3 15.4 15.8  Hematocrit 37.5 - 51.0 % 47.7 44.5 47.7  Platelets 150 - 450 x10E3/uL 239 202 239  No results found for: VD25OH  Clinical ASCVD: Yes  The 10-year ASCVD risk score (Arnett DK, et al., 2019) is: 44.6%   Values used to calculate the score:     Age: 76 years     Sex: Male     Is Non-Hispanic African American: No     Diabetic: Yes     Tobacco smoker: Yes     Systolic Blood Pressure: 114 mmHg     Is BP treated: Yes     HDL Cholesterol: 47 mg/dL     Total Cholesterol: 181 mg/dL    Depression screen [facility] 2/9 12/07/2020 12/06/2020 11/24/2019  Decreased Interest 0 0 0  Down, Depressed, Hopeless 0 0 0  PHQ - 2 Score 0 0 0  Altered sleeping - 0 3  Tired, decreased energy - 0 3  Change in appetite - 0 0  Feeling bad or failure  about yourself  - 0 0  Trouble concentrating - 0 0  Moving slowly or fidgety/restless - 0 0  Suicidal thoughts - 0 0  PHQ-9 Score - 0 6  Difficult doing work/chores - - Not difficult at all     Social History   Tobacco Use  Smoking Status Former   Packs/day: 2.50   Years: 12.00   Pack years: 30.00   Types: Cigarettes   Start date: 04/23/1959   Quit date: 08/14/1971   Years since quitting: 49.8  Smokeless Tobacco Never  Tobacco Comments   he has quit   BP Readings from Last 3 Encounters:  05/23/21 114/72  04/04/21 130/76  03/13/21 122/78   Pulse Readings from Last 3 Encounters:  04/04/21 76  03/13/21 81  03/08/21 67   Wt Readings from Last 3 Encounters:  05/23/21 204 lb 9.6 oz (92.8 kg)  04/04/21 211 lb 6.4 oz (95.9 kg)  03/13/21 211 lb (95.7 kg)   BMI Readings from Last 3 Encounters:  05/23/21 29.36 kg/m  04/04/21 30.33 kg/m  03/13/21 30.28 kg/m    Assessment/Interventions: Review of patient past medical history, allergies, medications, health status, including review of consultants reports, laboratory and other test data, was performed as part of comprehensive evaluation and provision of chronic care management services.   SDOH:  (Social Determinants of Health) assessments and interventions performed: No  SDOH Screenings   Alcohol Screen: Not on file  Depression (PHQ2-9): Low Risk    PHQ-2 Score: 0  Financial Resource Strain: Low Risk    Difficulty of Paying Living Expenses: Not hard at all  Food Insecurity: No Food Insecurity   Worried About Programme researcher, broadcasting/film/video in the Last Year: Never true   Ran Out of Food in the Last Year: Never true  Housing: Not on file  Physical Activity: Insufficiently Active   Days of Exercise per Week: 3 days   Minutes of Exercise per Session: 10 min  Social Connections: Not on file  Stress: No Stress Concern Present   Feeling of Stress : Not at all  Tobacco Use: Medium Risk   Smoking Tobacco Use: Former   Smokeless Tobacco  Use: Never   Passive Exposure: Not on file  Transportation Needs: No Transportation Needs   Lack of Transportation (Medical): No   Lack of Transportation (Non-Medical): No    CCM Care Plan  Allergies  Allergen Reactions   No Known Allergies     Medications Reviewed Today     Reviewed by [name], MD (Physician) on 05/23/21 at 1608  Med List Status: <None>  Medication Order Taking? Sig Documenting Provider Last Dose Status Informant  albuterol (PROVENTIL HFA;VENTOLIN HFA) 108 (90 Base) MCG/ACT inhaler [id number] Yes Inhale 2 puffs into the lungs every 6 (six) hours as needed for wheezing or shortness of breath. [provider] Taking Active   albuterol (PROVENTIL) (2.5 MG/3ML) 0.083% nebulizer solution [id number] Yes Take 3 mLs (2.5 mg total) by nebulization every 6 (six) hours as needed for wheezing or shortness of breath. [name], FNP Taking Active   amiodarone (PACERONE) 100 MG tablet [id number] Yes Take 100 mg by mouth daily.  [provider] Taking Active   amphetamine-dextroamphetamine (ADDERALL) 10 MG tablet [id number] Yes Take 1 tablet by mouth daily. Take 2 tablets in the morning and one tablet in the evenings. [provider] Taking Active   Armodafinil 250 MG tablet [id number] Yes Take 125-250 mg by mouth every morning. [provider] Taking Active   aspirin 81 MG tablet [id number] Yes Take 81 mg by mouth daily. [provider] Taking Active   atorvastatin (LIPITOR) 20 MG tablet [id number] Yes TAKE 1 TABLET DAILY [name], MD Taking Active   azelastine (ASTELIN) 0.1 % nasal spray [id number] Yes Place 1 spray into both nostrils 2 (two) times daily. Use in each nostril as directed [name], MD Taking Active   b complex vitamins capsule [id number] Yes Take 1 capsule by mouth daily. [provider] Taking Active   benzonatate (TESSALON PERLES) 100 MG capsule [id number] Yes Take 1 capsule (100 mg total) by mouth 3  (three) times daily as needed for cough. Bary Castilla, NP Taking Active   carvedilol (COREG) 6.25 MG tablet [id number] Yes Take 6.25 mg by mouth 2 (two) times daily with a meal. [provider] Taking Active   Cholecalciferol (VITAMIN D) 2000 units tablet [id number] Yes Take 2,000 Units by mouth daily. [provider] Taking Active   cyanocobalamin ((VITAMIN B-12)) injection 1,000 mcg [id number]   [name], MD  Active   diphenhydrAMINE (BENADRYL) 25 MG tablet [id number] Yes Take 25 mg by mouth every 6 (six) hours as needed for itching.  [provider] Taking Active   donepezil (ARICEPT) 10 MG tablet [id number] Yes Take 1 tablet (10 mg total) by mouth at bedtime. [name], MD Taking Active   DULoxetine (CYMBALTA) 30 MG capsule [id number] Yes TAKE 1 CAPSULE DAILY IN THE Fredric Mare, MD Taking Active   furosemide (LASIX) 20 MG tablet [id number] Yes Take 20 mg by mouth daily as needed.  [provider] Taking Active   glucose blood (FREESTYLE LITE) test strip [id number] Yes Use as instructed to check blood sugars  3 times per day dx: e11.65 [name], MD Taking Active   glucose monitoring kit (FREESTYLE) monitoring kit [id number] Yes 1 each by Does not apply route as needed for other. [provider] Taking Active   HYDROcodone-acetaminophen (NORCO/VICODIN) 5-325 MG tablet [id number] Yes Take 1 tablet by mouth every 6 (six) hours as needed for moderate pain. [provider] Taking Active   insulin glargine, 2 Unit Dial, (TOUJEO MAX SOLOSTAR) 300 UNIT/ML Solostar Pen [id number] Yes Inject 13 Units into the skin at bedtime. [provider] Taking Active   Insulin Pen Needle (NOVOFINE PEN NEEDLE) 32G X 6 MM MISC [id number] Yes Use with insulin pens [name], MD Taking Active   ipratropium (ATROVENT) 0.03 % nasal spray [id number] Yes Place 2 sprays into both nostrils every 12 (twelve) hours. [provider] Taking  Active   JARDIANCE 10 MG TABS  tablet [id number] Yes TAKE 1 TABLET DAILY [name], MD Taking Active   levocetirizine (XYZAL) 5 MG tablet [id number] Yes Take 5 mg by mouth every evening.  [provider] Taking Active   lidocaine (LIDODERM) 5 % [id number] Yes Place 3 patches onto the skin daily. May use 1-3 patches at one time, Remove & Discard patch within 12 hours or as directed by MD [name], MD Taking Active   losartan (COZAAR) 50 MG tablet [id number] Yes Take 50 mg by mouth 2 (two) times daily.  [provider] Taking Active   Magnesium 250 MG TABS [id number] Yes Take 1 tablet by mouth daily. [provider] Taking Active   meloxicam (MOBIC) 15 MG tablet [id number] Yes Take 15 mg by mouth daily. [provider] Taking Active   methocarbamol (ROBAXIN) 750 MG tablet [id number] Yes Take 750 mg by mouth 3 (three) times daily as needed for muscle spasms.  [provider] Taking Active   mirabegron ER (MYRBETRIQ) 50 MG TB24 tablet [id number] Yes Take 50 mg by mouth daily. [provider] Taking Active   montelukast (SINGULAIR) 10 MG tablet [id number] Yes Take 10 mg by mouth at bedtime. [provider] Taking Active   Multiple Vitamin (MULTIVITAMIN) tablet [id number] Yes Take 1 tablet by mouth daily. [provider] Taking Active   nitroGLYCERIN (NITROSTAT) 0.4 MG SL tablet [id number] Yes Place 0.4 mg under the tongue every 5 (five) minutes as needed for chest pain.  [provider] Taking Active   Omega-3 Fatty Acids (FISH OIL) 1200 MG CAPS [id number] Yes Take 1,200 mg by mouth.  [provider] Taking Active   pantoprazole (PROTONIX) 40 MG tablet [id number] Yes TAKE 1 TABLET DAILY [name], MD Taking Active   pramipexole (MIRAPEX) 0.5 MG tablet [id number] Yes Take 2 tablets (1 mg total) by mouth in the morning and at bedtime.  Patient taking differently: Take 1 mg by mouth in the morning and at bedtime. One in the  morning one in the afternoon and 2 at night.   [name], MD Taking Active   rivaroxaban (XARELTO) 10 MG TABS tablet [id number] Yes Take 10 mg by mouth daily. [provider] Taking Active   Semaglutide, 1 MG/DOSE, (OZEMPIC, 1 MG/DOSE,) 4 MG/3ML SOPN [id number] No Inject 1 mg into the skin once a week.  Patient not taking: Reported on 05/23/2021   [name], FNP Not Taking Active   solifenacin (VESICARE) 5 MG tablet [id number] Yes Take 1 tablet (5 mg total) by mouth daily. [name], MD Taking Active   SYNTHROID 50 MCG tablet [id number] Yes TAKE 1 TABLET DAILY [name], MD Taking Active            Med Note Simona Huh Mar 20, 2021  2:47 PM) Taking 1 tablet by mouth Monday through Friday only.  vitamin B-12 (CYANOCOBALAMIN) 1000 MCG tablet [id number] Yes Take 1,000 mcg by mouth daily. [provider] Taking Active            Med Note Simona Huh Mar 20, 2021  2:43 PM)    Grant Ruts INHUB 100-50 MCG/ACT AEPB [id number] Yes Inhale 1 puff into the lungs 2 (two) times daily. 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   Atherosclerosis of native coronary artery of native heart without angina pectoris 06/17/2018   Arrhythmia 11/18/2017   Hyperlipidemia 11/18/2017   Hypertension 11/18/2017   Varicose veins of left lower extremity with complications 53/74/8270   Pain in both lower extremities 08/14/2015   Globus pharyngeus 07/17/2015   Abdominal pain  12/12/2013   History of carcinoma in situ of prostate 12/12/2013   Male erectile dysfunction 12/12/2013   Personal history of prostate cancer 12/12/2013   OAB (overactive bladder) 11/03/2013   Nocturia 11/03/2013   Stress incontinence 11/03/2013   Urinary incontinence 11/03/2013   Headache associated with sexual activity 12/14/2012   Memory loss 12/14/2012   Myalgia and myositis 12/14/2012   Obstructive sleep apnea 12/14/2012   Insomnia 12/14/2012   Partial epilepsy with impairment of consciousness, with intractable epilepsy ([place]) 12/14/2012   Restless legs syndrome 12/14/2012   Tension type headache 12/14/2012   Dehydration, severe 78/67/5449   Metabolic acidosis 20/01/711   Noninfective gastroenteritis and colitis 07/11/2012    Immunization History  Administered Date(s) Administered   Fluad Quad(high Dose 65+) 03/14/2020, 02/08/2021   H1N1 07/04/2008   Influenza Split 02/20/2014   Influenza, High Dose Seasonal PF 12/30/2018   Influenza, Seasonal, Injecte, Preservative Fre 04/28/2015   Influenza,inj,Quad PF,6+ Mos 05/04/2018   Moderna SARS-COV2 Booster Vaccination 02/18/2020   Moderna Sars-Covid-2 Vaccination 06/30/2019, 07/21/2019, 07/28/2019, 03/20/2020, 03/27/2021   Pneumococcal Conjugate-13 03/10/2014   Pneumococcal Polysaccharide-23 08/31/2014   Pneumococcal-Unspecified 04/10/2010, 08/31/2014   Tdap 03/10/2014, 11/03/2015   Zoster Recombinat (Shingrix) 05/16/2020, 07/17/2020    Conditions to be addressed/monitored:  Hypertension and Diabetes  Care Plan : [place]  Updates made by Mayford Knife, [place] since 06/06/2021 12:00 AM     Problem: HTN, DM II      Long-Range Goal: Disease Management   Start Date: 08/24/2020  Recent Progress: On track  Priority: High  Note:   Current Barriers:  Unable to self administer medications as prescribed Does not adhere to prescribed medication regimen  Pharmacist Clinical Goal(s):  Patient will achieve  adherence to monitoring guidelines and medication adherence to achieve therapeutic efficacy through collaboration with PharmD and provider.   Interventions: 1:1 collaboration with [name], MD regarding development and update of comprehensive plan of care as evidenced by provider attestation and co-signature Inter-disciplinary care team collaboration (see longitudinal plan of care) Comprehensive medication review performed; medication list updated in electronic medical record  Hypertension (BP goal <130/80) -Uncontrolled -Current treatment: Losartan 50 mg tablet taking 2 tablets by mouth daily Appropriate, Effective, Safe, Accessible Carvedilol 6.25 mg taking 1 tablet by mouth twice per day Appropriate, Effective, Safe, Accessible Amiodarone 100 mg tablet by mouth daily.  Appropriate, Effective, Safe, Accessible -Current home readings: patient reports that his BP is doing well, his readings have been 110-120/70, pulse in with in range- Mr. Navarez reports that the number never causes him concern -Current dietary habits: he does not use any salt on  his food -Current exercise habits: patient is not currently exercising -Denies hypotensive/hypertensive symptoms -Educated on Daily salt intake goal < 2300 mg; Importance of home blood pressure monitoring; -He is starting to check the labels of his food, we reviewed a can of soup  that he commonly eats, the amount of salt in the soup is 1200 -Counseled to monitor BP at home at least twice per week, document, and provide log at future appointments -Recommended to continue current medication  Diabetes (A1c goal <8%) -Controlled -Current medications: Jardiance 10 mg tablet by mouth daily  Appropriate, Effective, Safe, Accessible Ozempic 1 mg once a week Appropriate, Effective, Safe, Accessible Toujeo Max Solostar 300 unit/ML- inject 13 units into the skin at bedtime  Appropriate, Effective, Safe, Accessible -Current home glucose readings:  Continuous glucose monitoring Readings are  -Denies hypoglycemic/hyperglycemic symptoms -Current meal patterns:  breakfast: two eggs, with ketchup   lunch: soup  dinner: shrimp with slaw, and baked potato. Sometimes he eats spaghetti  snacks: he does not eat many snacks at all, no candy or cookies- he might have some chips with dip, boiled peanuts, fruit  drinks: he has cut back on the amount of milk he is drinking, he was drinking a 1/2 gallon of milk per day and now cut back to 16 ounces  -Current exercise: patient is not currently able to exercise at this time  -Congratulated patient on losing 46 pounds  -Congratulated patient on reducing his A1c by three points -Educated on A1c and blood sugar goals; Complications of diabetes including kidney damage, retinal damage, and cardiovascular disease; Prevention and management of hypoglycemic episodes; Benefits of routine self-monitoring of blood sugar; -Counseled to check feet daily and get yearly eye exams -Recommended to continue current medication  Patient Goals/Self-Care Activities Patient will:  - take medications as prescribed as evidenced by patient report and record review  Follow Up Plan: The patient has been provided with contact information for the care management team and has been advised to call with any health related questions or concerns.       Medication Assistance: None required.  Patient affirms current coverage meets needs.  Compliance/Adherence/Medication fill history: Care Gaps: Opthamology Exam  Foot Exam COVID-19 Vaccine   Star-Rating Drugs: Atorvastatin 20 mg tablet  Jardiance 10 mg  Losartan 50 mg tablet   Patient's preferred pharmacy is:  [place], [place] [address] [place] [address] Phone: [phone] Fax: [place] [address] [place] AT [facility] [place] [address] Phone: [phone] Fax: [phone]  [facility] [address] [place], [place] [address] Phone: [phone] Fax: [phone]  Uses pill box? Yes Pt endorses 85% compliance  -Using a pill box, but due to the amount of pills it can take an hour to fill the pill  box, he recently missed two days of medication because his pill box was not filled.   We discussed: Benefits of medication synchronization, packaging and delivery as well as enhanced pharmacist oversight with [employer]. Patient decided to:  he would like to know the cost of his medication, and if all medications could be packaged if not it would not be worth the service.  Care Plan and Follow Up Patient Decision:  Patient agrees to Care Plan and Follow-up.  Plan: The patient has been provided with contact information for the care management team and has been advised to call with any health related questions or concerns.   Orlando Penner, CPP, PharmD Clinical Pharmacist Practitioner [facility] [phone]

## 2021-05-29 NOTE — Telephone Encounter (Signed)
The pt was asked if he requested test from [facility] and the patient said yes. The test is a hereditary cancer 38 Gene NGS test.

## 2021-05-30 ENCOUNTER — Telehealth: Payer: Self-pay

## 2021-05-30 ENCOUNTER — Telehealth: Payer: Medicare Other

## 2021-05-30 NOTE — Telephone Encounter (Signed)
The pt was notified that the form for [facility] needed the pt's signature before it can be faxed and that Dr. Baird Cancer has already signed it.

## 2021-05-31 DIAGNOSIS — R0609 Other forms of dyspnea: Secondary | ICD-10-CM | POA: Diagnosis not present

## 2021-06-02 ENCOUNTER — Ambulatory Visit (HOSPITAL_BASED_OUTPATIENT_CLINIC_OR_DEPARTMENT_OTHER): Payer: Medicare Other

## 2021-06-06 ENCOUNTER — Encounter: Payer: Self-pay | Admitting: Internal Medicine

## 2021-06-06 NOTE — Patient Instructions (Signed)
Visit Information It was great speaking with you today!  Please let me know if you have any questions about our visit.   Goals Addressed             This Visit's Progress    Manage My Medicine       Timeframe:  Long-Range Goal Priority:  High Start Date:                             Expected End Date:                       Follow Up Date 08/29/2021  In Progress:    - call for medicine refill 2 or 3 days before it runs out - call if I am sick and can't take my medicine - keep a list of all the medicines I take; vitamins and herbals too - use a pillbox to sort medicine - use an alarm clock or phone to remind me to take my medicine    Why is this important?   These steps will help you keep on track with your medicines.         Patient Care Plan: CCM Pharmacy Care Plan     Problem Identified: HTN, DM II      Long-Range Goal: Disease Management   Start Date: 08/24/2020  Recent Progress: On track  Priority: High  Note:   Current Barriers:  Unable to self administer medications as prescribed Does not adhere to prescribed medication regimen  Pharmacist Clinical Goal(s):  Patient will achieve adherence to monitoring guidelines and medication adherence to achieve therapeutic efficacy through collaboration with PharmD and provider.   Interventions: 1:1 collaboration with [name], MD regarding development and update of comprehensive plan of care as evidenced by provider attestation and co-signature Inter-disciplinary care team collaboration (see longitudinal plan of care) Comprehensive medication review performed; medication list updated in electronic medical record  Hypertension (BP goal <130/80) -Uncontrolled -Current treatment: Losartan 50 mg tablet taking 2 tablets by mouth daily Appropriate, Effective, Safe, Accessible Carvedilol 6.25 mg taking 1 tablet by mouth twice per day Appropriate, Effective, Safe, Accessible Amiodarone 100 mg tablet by mouth daily.   Appropriate, Effective, Safe, Accessible -Current home readings: patient reports that his BP is doing well, his readings have been 110-120/70, pulse in with in range- Mr. Viloria reports that the number never causes him concern -Current dietary habits: he does not use any salt on  his food -Current exercise habits: patient is not currently exercising -Denies hypotensive/hypertensive symptoms -Educated on Daily salt intake goal < 2300 mg; Importance of home blood pressure monitoring; -He is starting to check the labels of his food, we reviewed a can of soup that he commonly eats, the amount of salt in the soup is 1200 -Counseled to monitor BP at home at least twice per week, document, and provide log at future appointments -Recommended to continue current medication  Diabetes (A1c goal <8%) -Controlled -Current medications: Jardiance 10 mg tablet by mouth daily  Appropriate, Effective, Safe, Accessible Ozempic 1 mg once a week Appropriate, Effective, Safe, Accessible Toujeo Max Solostar 300 unit/ML- inject 13 units into the skin at bedtime  Appropriate, Effective, Safe, Accessible -Current home glucose readings: Continuous glucose monitoring Readings are  -Denies hypoglycemic/hyperglycemic symptoms -Current meal patterns:  breakfast: two eggs, with ketchup   lunch: soup  dinner: shrimp with slaw, and baked potato. Sometimes he eats spaghetti  snacks: he does not eat many snacks at all, no candy or cookies- he might have some chips with dip, boiled peanuts, fruit  drinks: he has cut back on the amount of milk he is drinking, he was drinking a 1/2 gallon of milk per day and now cut back to 16 ounces  -Current exercise: patient is not currently able to exercise at this time  -Congratulated patient on losing 46 pounds  -Congratulated patient on reducing his A1c by three points -Educated on A1c and blood sugar goals; Complications of diabetes including kidney damage, retinal damage, and  cardiovascular disease; Prevention and management of hypoglycemic episodes; Benefits of routine self-monitoring of blood sugar; -Counseled to check feet daily and get yearly eye exams -Recommended to continue current medication  Patient Goals/Self-Care Activities Patient will:  - take medications as prescribed as evidenced by patient report and record review  Follow Up Plan: The patient has been provided with contact information for the care management team and has been advised to call with any health related questions or concerns.       Patient agreed to services and verbal consent obtained.   The patient verbalized understanding of instructions, educational materials, and care plan provided today and agreed to receive a mailed copy of patient instructions, educational materials, and care plan.   Orlando Penner, PharmD Clinical Pharmacist [facility] [phone]

## 2021-06-07 ENCOUNTER — Other Ambulatory Visit: Payer: Self-pay

## 2021-06-07 DIAGNOSIS — R0609 Other forms of dyspnea: Secondary | ICD-10-CM

## 2021-06-08 DIAGNOSIS — Z8546 Personal history of malignant neoplasm of prostate: Secondary | ICD-10-CM | POA: Diagnosis not present

## 2021-06-08 LAB — BRAIN NATRIURETIC PEPTIDE: BNP: 97 pg/mL (ref 0.0–100.0)

## 2021-06-09 ENCOUNTER — Other Ambulatory Visit: Payer: Self-pay

## 2021-06-09 ENCOUNTER — Ambulatory Visit (HOSPITAL_BASED_OUTPATIENT_CLINIC_OR_DEPARTMENT_OTHER)
Admission: RE | Admit: 2021-06-09 | Discharge: 2021-06-09 | Disposition: A | Discharge status: Home / Self Care | Payer: Medicare Other | Source: Ambulatory Visit | Attending: Internal Medicine | Admitting: Internal Medicine

## 2021-06-09 DIAGNOSIS — R2689 Other abnormalities of gait and mobility: Secondary | ICD-10-CM

## 2021-06-09 DIAGNOSIS — R413 Other amnesia: Secondary | ICD-10-CM | POA: Diagnosis not present

## 2021-06-11 DIAGNOSIS — M5416 Radiculopathy, lumbar region: Secondary | ICD-10-CM | POA: Diagnosis not present

## 2021-06-11 DIAGNOSIS — G5732 Lesion of lateral popliteal nerve, left lower limb: Secondary | ICD-10-CM | POA: Diagnosis not present

## 2021-06-11 DIAGNOSIS — M21372 Foot drop, left foot: Secondary | ICD-10-CM | POA: Diagnosis not present

## 2021-06-11 DIAGNOSIS — R269 Unspecified abnormalities of gait and mobility: Secondary | ICD-10-CM | POA: Diagnosis not present

## 2021-06-11 DIAGNOSIS — G959 Disease of spinal cord, unspecified: Secondary | ICD-10-CM | POA: Diagnosis not present

## 2021-06-12 DIAGNOSIS — M542 Cervicalgia: Secondary | ICD-10-CM | POA: Diagnosis not present

## 2021-06-12 DIAGNOSIS — M21372 Foot drop, left foot: Secondary | ICD-10-CM | POA: Diagnosis not present

## 2021-06-12 DIAGNOSIS — G2581 Restless legs syndrome: Secondary | ICD-10-CM | POA: Diagnosis not present

## 2021-06-12 DIAGNOSIS — M545 Low back pain, unspecified: Secondary | ICD-10-CM | POA: Diagnosis not present

## 2021-06-12 DIAGNOSIS — G4719 Other hypersomnia: Secondary | ICD-10-CM | POA: Diagnosis not present

## 2021-06-14 DIAGNOSIS — R918 Other nonspecific abnormal finding of lung field: Secondary | ICD-10-CM | POA: Diagnosis not present

## 2021-06-16 DIAGNOSIS — M4802 Spinal stenosis, cervical region: Secondary | ICD-10-CM | POA: Diagnosis not present

## 2021-06-16 DIAGNOSIS — M50022 Cervical disc disorder at C5-C6 level with myelopathy: Secondary | ICD-10-CM | POA: Diagnosis not present

## 2021-06-16 DIAGNOSIS — R269 Unspecified abnormalities of gait and mobility: Secondary | ICD-10-CM | POA: Diagnosis not present

## 2021-06-16 DIAGNOSIS — M4714 Other spondylosis with myelopathy, thoracic region: Secondary | ICD-10-CM | POA: Diagnosis not present

## 2021-06-16 DIAGNOSIS — M4312 Spondylolisthesis, cervical region: Secondary | ICD-10-CM | POA: Diagnosis not present

## 2021-06-16 DIAGNOSIS — M5104 Intervertebral disc disorders with myelopathy, thoracic region: Secondary | ICD-10-CM | POA: Diagnosis not present

## 2021-06-16 DIAGNOSIS — M21372 Foot drop, left foot: Secondary | ICD-10-CM | POA: Diagnosis not present

## 2021-06-16 DIAGNOSIS — G959 Disease of spinal cord, unspecified: Secondary | ICD-10-CM | POA: Diagnosis not present

## 2021-06-16 DIAGNOSIS — M2578 Osteophyte, vertebrae: Secondary | ICD-10-CM | POA: Diagnosis not present

## 2021-06-16 DIAGNOSIS — M4711 Other spondylosis with myelopathy, occipito-atlanto-axial region: Secondary | ICD-10-CM | POA: Diagnosis not present

## 2021-06-19 DIAGNOSIS — Z5181 Encounter for therapeutic drug level monitoring: Secondary | ICD-10-CM | POA: Diagnosis not present

## 2021-06-19 DIAGNOSIS — Z794 Long term (current) use of insulin: Secondary | ICD-10-CM | POA: Diagnosis not present

## 2021-06-19 DIAGNOSIS — E1140 Type 2 diabetes mellitus with diabetic neuropathy, unspecified: Secondary | ICD-10-CM | POA: Diagnosis not present

## 2021-06-19 DIAGNOSIS — E1159 Type 2 diabetes mellitus with other circulatory complications: Secondary | ICD-10-CM | POA: Diagnosis not present

## 2021-06-19 DIAGNOSIS — E1165 Type 2 diabetes mellitus with hyperglycemia: Secondary | ICD-10-CM | POA: Diagnosis not present

## 2021-06-19 DIAGNOSIS — N182 Chronic kidney disease, stage 2 (mild): Secondary | ICD-10-CM | POA: Diagnosis not present

## 2021-06-19 DIAGNOSIS — I10 Essential (primary) hypertension: Secondary | ICD-10-CM | POA: Diagnosis not present

## 2021-06-19 DIAGNOSIS — E1122 Type 2 diabetes mellitus with diabetic chronic kidney disease: Secondary | ICD-10-CM | POA: Diagnosis not present

## 2021-06-19 DIAGNOSIS — E1169 Type 2 diabetes mellitus with other specified complication: Secondary | ICD-10-CM | POA: Diagnosis not present

## 2021-06-19 DIAGNOSIS — I152 Hypertension secondary to endocrine disorders: Secondary | ICD-10-CM | POA: Diagnosis not present

## 2021-06-19 DIAGNOSIS — E785 Hyperlipidemia, unspecified: Secondary | ICD-10-CM | POA: Diagnosis not present

## 2021-06-20 DIAGNOSIS — M5412 Radiculopathy, cervical region: Secondary | ICD-10-CM | POA: Diagnosis not present

## 2021-06-20 DIAGNOSIS — M5417 Radiculopathy, lumbosacral region: Secondary | ICD-10-CM | POA: Diagnosis not present

## 2021-06-20 DIAGNOSIS — G4719 Other hypersomnia: Secondary | ICD-10-CM | POA: Diagnosis not present

## 2021-06-20 DIAGNOSIS — Z79899 Other long term (current) drug therapy: Secondary | ICD-10-CM | POA: Diagnosis not present

## 2021-06-20 DIAGNOSIS — G603 Idiopathic progressive neuropathy: Secondary | ICD-10-CM | POA: Diagnosis not present

## 2021-06-20 DIAGNOSIS — G2581 Restless legs syndrome: Secondary | ICD-10-CM | POA: Diagnosis not present

## 2021-06-20 DIAGNOSIS — G5603 Carpal tunnel syndrome, bilateral upper limbs: Secondary | ICD-10-CM | POA: Diagnosis not present

## 2021-06-21 DIAGNOSIS — Z791 Long term (current) use of non-steroidal anti-inflammatories (NSAID): Secondary | ICD-10-CM | POA: Diagnosis not present

## 2021-06-21 DIAGNOSIS — M150 Primary generalized (osteo)arthritis: Secondary | ICD-10-CM | POA: Diagnosis not present

## 2021-06-21 DIAGNOSIS — M351 Other overlap syndromes: Secondary | ICD-10-CM | POA: Diagnosis not present

## 2021-06-28 DIAGNOSIS — E119 Type 2 diabetes mellitus without complications: Secondary | ICD-10-CM | POA: Diagnosis not present

## 2021-07-05 DIAGNOSIS — G2581 Restless legs syndrome: Secondary | ICD-10-CM | POA: Diagnosis not present

## 2021-07-05 DIAGNOSIS — G4736 Sleep related hypoventilation in conditions classified elsewhere: Secondary | ICD-10-CM | POA: Diagnosis not present

## 2021-07-05 DIAGNOSIS — J301 Allergic rhinitis due to pollen: Secondary | ICD-10-CM | POA: Diagnosis not present

## 2021-07-05 DIAGNOSIS — R918 Other nonspecific abnormal finding of lung field: Secondary | ICD-10-CM | POA: Diagnosis not present

## 2021-07-05 DIAGNOSIS — J4520 Mild intermittent asthma, uncomplicated: Secondary | ICD-10-CM | POA: Diagnosis not present

## 2021-07-16 DIAGNOSIS — M21372 Foot drop, left foot: Secondary | ICD-10-CM | POA: Diagnosis not present

## 2021-07-23 ENCOUNTER — Encounter: Payer: Self-pay | Admitting: Internal Medicine

## 2021-08-02 DIAGNOSIS — M542 Cervicalgia: Secondary | ICD-10-CM | POA: Diagnosis not present

## 2021-08-02 DIAGNOSIS — R519 Headache, unspecified: Secondary | ICD-10-CM | POA: Diagnosis not present

## 2021-08-02 DIAGNOSIS — Z79899 Other long term (current) drug therapy: Secondary | ICD-10-CM | POA: Diagnosis not present

## 2021-08-02 DIAGNOSIS — G4719 Other hypersomnia: Secondary | ICD-10-CM | POA: Diagnosis not present

## 2021-08-02 DIAGNOSIS — M545 Low back pain, unspecified: Secondary | ICD-10-CM | POA: Diagnosis not present

## 2021-08-02 DIAGNOSIS — G2581 Restless legs syndrome: Secondary | ICD-10-CM | POA: Diagnosis not present

## 2021-08-02 DIAGNOSIS — M5417 Radiculopathy, lumbosacral region: Secondary | ICD-10-CM | POA: Diagnosis not present

## 2021-08-21 DIAGNOSIS — Z961 Presence of intraocular lens: Secondary | ICD-10-CM | POA: Diagnosis not present

## 2021-08-21 DIAGNOSIS — E119 Type 2 diabetes mellitus without complications: Secondary | ICD-10-CM | POA: Diagnosis not present

## 2021-08-21 DIAGNOSIS — H26492 Other secondary cataract, left eye: Secondary | ICD-10-CM | POA: Diagnosis not present

## 2021-08-21 DIAGNOSIS — H43812 Vitreous degeneration, left eye: Secondary | ICD-10-CM | POA: Diagnosis not present

## 2021-08-21 DIAGNOSIS — H02834 Dermatochalasis of left upper eyelid: Secondary | ICD-10-CM | POA: Diagnosis not present

## 2021-08-21 DIAGNOSIS — H04123 Dry eye syndrome of bilateral lacrimal glands: Secondary | ICD-10-CM | POA: Diagnosis not present

## 2021-08-21 DIAGNOSIS — H40013 Open angle with borderline findings, low risk, bilateral: Secondary | ICD-10-CM | POA: Diagnosis not present

## 2021-08-21 DIAGNOSIS — H02831 Dermatochalasis of right upper eyelid: Secondary | ICD-10-CM | POA: Diagnosis not present

## 2021-08-21 LAB — HM DIABETES EYE EXAM

## 2021-08-28 ENCOUNTER — Telehealth: Payer: Self-pay

## 2021-08-28 NOTE — Chronic Care Management (AMB) (Signed)
     FOCH ROSENWALD was reminded to have all medications, supplements and any blood glucose and blood pressure readings available for review with Orlando Penner, Pharm. D, at his telephone visit on 08-28-2021 at 11:15. Patie t stated he will call back later if day doesn't work.    [place] Pharmacist Assistant [phone]

## 2021-08-29 ENCOUNTER — Telehealth: Payer: Medicare Other

## 2021-09-06 DIAGNOSIS — Z5181 Encounter for therapeutic drug level monitoring: Secondary | ICD-10-CM | POA: Diagnosis not present

## 2021-09-06 DIAGNOSIS — E1165 Type 2 diabetes mellitus with hyperglycemia: Secondary | ICD-10-CM | POA: Diagnosis not present

## 2021-09-06 DIAGNOSIS — E785 Hyperlipidemia, unspecified: Secondary | ICD-10-CM | POA: Diagnosis not present

## 2021-09-06 DIAGNOSIS — E1140 Type 2 diabetes mellitus with diabetic neuropathy, unspecified: Secondary | ICD-10-CM | POA: Diagnosis not present

## 2021-09-06 DIAGNOSIS — Z794 Long term (current) use of insulin: Secondary | ICD-10-CM | POA: Diagnosis not present

## 2021-09-06 DIAGNOSIS — E1159 Type 2 diabetes mellitus with other circulatory complications: Secondary | ICD-10-CM | POA: Diagnosis not present

## 2021-09-06 DIAGNOSIS — I152 Hypertension secondary to endocrine disorders: Secondary | ICD-10-CM | POA: Diagnosis not present

## 2021-09-06 DIAGNOSIS — E1169 Type 2 diabetes mellitus with other specified complication: Secondary | ICD-10-CM | POA: Diagnosis not present

## 2021-09-06 DIAGNOSIS — I2510 Atherosclerotic heart disease of native coronary artery without angina pectoris: Secondary | ICD-10-CM | POA: Diagnosis not present

## 2021-09-06 DIAGNOSIS — E1142 Type 2 diabetes mellitus with diabetic polyneuropathy: Secondary | ICD-10-CM | POA: Diagnosis not present

## 2021-09-08 DIAGNOSIS — R2241 Localized swelling, mass and lump, right lower limb: Secondary | ICD-10-CM | POA: Diagnosis not present

## 2021-09-08 DIAGNOSIS — S8991XA Unspecified injury of right lower leg, initial encounter: Secondary | ICD-10-CM | POA: Diagnosis not present

## 2021-09-08 DIAGNOSIS — I8391 Asymptomatic varicose veins of right lower extremity: Secondary | ICD-10-CM | POA: Diagnosis not present

## 2021-09-10 ENCOUNTER — Ambulatory Visit (INDEPENDENT_AMBULATORY_CARE_PROVIDER_SITE_OTHER): Payer: Medicare Other | Admitting: Internal Medicine

## 2021-09-10 ENCOUNTER — Telehealth: Payer: Self-pay

## 2021-09-10 VITALS — BP 116/72 | HR 84 | Temp 98.6°F | Ht 66.0 in | Wt 202.8 lb

## 2021-09-10 DIAGNOSIS — L03119 Cellulitis of unspecified part of limb: Secondary | ICD-10-CM

## 2021-09-10 DIAGNOSIS — M7989 Other specified soft tissue disorders: Secondary | ICD-10-CM

## 2021-09-10 MED ORDER — AMOXICILLIN-POT CLAVULANATE 875-125 MG PO TABS
1.0000 | ORAL_TABLET | Freq: Two times a day (BID) | ORAL | 0 refills | Status: DC
Start: 2021-09-10 — End: 2022-06-01

## 2021-09-10 NOTE — Telephone Encounter (Signed)
Transition Care Management Follow-up Telephone Call Date of discharge and from where: 09/10/2021 [facility]  How have you been since you were released from the hospital? Pt states he feels okay, but his right leg is still swollen. Any questions or concerns? No  Items Reviewed: Did the pt receive and understand the discharge instructions provided? Yes  Medications obtained and verified? Yes  Other? No  Any new allergies since your discharge? No  Dietary orders reviewed? Yes Do you have support at home? Yes   Home Care and Equipment/Supplies: Were home health services ordered? no If so, what is the name of the agency? N/a  Has the agency set up a time to come to the patient's home? no Were any new equipment or medical supplies ordered?  No What is the name of the medical supply agency? N/a Were you able to get the supplies/equipment? no Do you have any questions related to the use of the equipment or supplies? No  Functional Questionnaire: (I = Independent and D = Dependent) ADLs: i  Bathing/Dressing- i  Meal Prep- i  Eating- i  Maintaining continence- i  Transferring/Ambulation- i  Managing Meds- i  Follow up appointments reviewed:  PCP Hospital f/u appt confirmed? No  Scheduled to see n/a on n/a @ n/a. [facility] f/u appt confirmed? No  Scheduled to see n/a on n/a @ n/a. Are transportation arrangements needed? No  If their condition worsens, is the pt aware to call PCP or go to the Emergency Dept.? Yes Was the patient provided with contact information for the PCP's office or ED? Yes Was to pt encouraged to call back with questions or concerns? Yes

## 2021-09-10 NOTE — Progress Notes (Signed)
[name],acting as a Education administrator for Franklin Greenland, MD.,have documented all relevant documentation on the behalf of Franklin Greenland, MD,as directed by  Franklin Greenland, MD while in the presence of Franklin Greenland, MD.  This visit occurred during the SARS-CoV-2 public health emergency.  Safety protocols were in place, including screening questions prior to the visit, additional usage of staff PPE, and extensive cleaning of exam room while observing appropriate contact time as indicated for disinfecting solutions.  Subjective:     Patient ID: Franklin Thornton , male    DOB: 10/02/1945 , 76 y.o.   MRN: [id number]   Chief Complaint  Patient presents with   ER F/U    HPI  Patient presents today for ER F/u. He presented to [facility] ED on 09/08/21 for further evaluation of leg pain/swelling. He reports getting a laceration to the anterior right lower leg, he initially noticed this about three days ago. He is not sure how he got the laceration. He went to ER due to concern for infection due to swelling and erythema of his leg. [name] He states the right lower extremity is significantly more swollen relative to the left, and they do appear symmetric at baseline. Patient is currently anticoagulated and endorses several scattered abrasions and bruises related thereto. He is also taking furosemide. He reports some extremity numbness at baseline and denies any acute changes thereof. CT tib/fib performed, results significant for: No evidence of hematoma within the lower leg, circumferential skin thickening with diffuse subcutaneous edema and ill-defined fluid throughout the lower leg, no organized fluid collection or soft tissue gas. Findings are nonspecific and may represent edema versus cellulitis in the appropriate clinical setting. No acute osseous abnormality.  He was not prescribed any abx for infection despite warmth and presence of multiple lacerations. He states leg has gotten more swollen and  feels quite heavy.        Past Medical History:  Diagnosis Date   Atrial fibrillation ([place])    Diabetes mellitus without complication ([place])    Peripheral vascular disease ([place])    Prostate cancer ([place])    Uvular edema 07/17/2015     Family History  Problem Relation Age of Onset   Lung cancer Mother    Diabetes Mother    Esophageal cancer Father    Hypertension Sister    Breast cancer Sister    Healthy Son    Healthy Daughter      Current Outpatient Medications:    albuterol (PROVENTIL HFA;VENTOLIN HFA) 108 (90 Base) MCG/ACT inhaler, Inhale 2 puffs into the lungs every 6 (six) hours as needed for wheezing or shortness of breath., Disp: , Rfl:    albuterol (PROVENTIL) (2.5 MG/3ML) 0.083% nebulizer solution, Take 3 mLs (2.5 mg total) by nebulization every 6 (six) hours as needed for wheezing or shortness of breath., Disp: 75 mL, Rfl: 3   amiodarone (PACERONE) 100 MG tablet, Take 100 mg by mouth daily. , Disp: , Rfl:    amoxicillin-clavulanate (AUGMENTIN) 875-125 MG tablet, Take 1 tablet by mouth 2 (two) times daily., Disp: 20 tablet, Rfl: 0   amphetamine-dextroamphetamine (ADDERALL) 10 MG tablet, Take 1 tablet by mouth daily. Take 2 tablets in the morning and one tablet in the evenings., Disp: , Rfl:    Armodafinil 250 MG tablet, Take 125-250 mg by mouth every morning., Disp: , Rfl:    aspirin 81 MG tablet, Take 81 mg by mouth daily., Disp: , Rfl:    atorvastatin (LIPITOR)  20 MG tablet, TAKE 1 TABLET DAILY, Disp: 90 tablet, Rfl: 3   azelastine (ASTELIN) 0.1 % nasal spray, Place 1 spray into both nostrils 2 (two) times daily. Use in each nostril as directed, Disp: 30 mL, Rfl: 12   b complex vitamins capsule, Take 1 capsule by mouth daily., Disp: , Rfl:    benzonatate (TESSALON PERLES) 100 MG capsule, Take 1 capsule (100 mg total) by mouth 3 (three) times daily as needed for cough., Disp: 30 capsule, Rfl: 0   carvedilol (COREG) 6.25 MG tablet, Take 6.25 mg by mouth 2 (two) times  daily with a meal., Disp: , Rfl:    Cholecalciferol (VITAMIN D) 2000 units tablet, Take 2,000 Units by mouth daily., Disp: , Rfl:    diphenhydrAMINE (BENADRYL) 25 MG tablet, Take 25 mg by mouth every 6 (six) hours as needed for itching. , Disp: , Rfl:    donepezil (ARICEPT) 10 MG tablet, Take 1 tablet (10 mg total) by mouth at bedtime., Disp: 90 tablet, Rfl: 3   furosemide (LASIX) 20 MG tablet, Take 20 mg by mouth daily as needed. , Disp: , Rfl:    glucose blood (FREESTYLE LITE) test strip, Use as instructed to check blood sugars  3 times per day dx: e11.65, Disp: 300 each, Rfl: 1   glucose monitoring kit (FREESTYLE) monitoring kit, 1 each by Does not apply route as needed for other., Disp: , Rfl:    HYDROcodone-acetaminophen (NORCO/VICODIN) 5-325 MG tablet, Take 1 tablet by mouth every 6 (six) hours as needed for moderate pain., Disp: , Rfl:    insulin glargine, 2 Unit Dial, (TOUJEO MAX SOLOSTAR) 300 UNIT/ML Solostar Pen, Inject 13 Units into the skin at bedtime., Disp: , Rfl:    Insulin Pen Needle (NOVOFINE PEN NEEDLE) 32G X 6 MM MISC, Use with insulin pens, Disp: 300 each, Rfl: 3   ipratropium (ATROVENT) 0.03 % nasal spray, Place 2 sprays into both nostrils every 12 (twelve) hours., Disp: , Rfl:    JARDIANCE 10 MG TABS tablet, TAKE 1 TABLET DAILY, Disp: 90 tablet, Rfl: 3   levocetirizine (XYZAL) 5 MG tablet, Take 5 mg by mouth every evening. , Disp: , Rfl:    lidocaine (LIDODERM) 5 %, Place 3 patches onto the skin daily. May use 1-3 patches at one time, Remove & Discard patch within 12 hours or as directed by MD, Disp: 90 patch, Rfl: 0   losartan (COZAAR) 50 MG tablet, Take 50 mg by mouth 2 (two) times daily. , Disp: , Rfl:    Magnesium 250 MG TABS, Take 1 tablet by mouth daily., Disp: , Rfl:    meloxicam (MOBIC) 15 MG tablet, Take 15 mg by mouth daily., Disp: , Rfl:    methocarbamol (ROBAXIN) 750 MG tablet, Take 750 mg by mouth 3 (three) times daily as needed for muscle spasms. , Disp: , Rfl:     mirabegron ER (MYRBETRIQ) 50 MG TB24 tablet, Take 50 mg by mouth daily., Disp: , Rfl:    montelukast (SINGULAIR) 10 MG tablet, Take 10 mg by mouth at bedtime., Disp: , Rfl:    Multiple Vitamin (MULTIVITAMIN) tablet, Take 1 tablet by mouth daily., Disp: , Rfl:    nitroGLYCERIN (NITROSTAT) 0.4 MG SL tablet, Place 0.4 mg under the tongue every 5 (five) minutes as needed for chest pain. , Disp: , Rfl:    Omega-3 Fatty Acids (FISH OIL) 1200 MG CAPS, Take 1,200 mg by mouth. , Disp: , Rfl:    pantoprazole (PROTONIX) 40 MG   tablet, TAKE 1 TABLET DAILY, Disp: 90 tablet, Rfl: 3   pramipexole (MIRAPEX) 0.5 MG tablet, Take 2 tablets (1 mg total) by mouth in the morning and at bedtime. (Patient taking differently: Take 1 mg by mouth in the morning and at bedtime. One in the morning one in the afternoon and 2 at night.), Disp: 90 tablet, Rfl: 1   rivaroxaban (XARELTO) 10 MG TABS tablet, Take 10 mg by mouth daily., Disp: , Rfl:    Semaglutide, 1 MG/DOSE, (OZEMPIC, 1 MG/DOSE,) 4 MG/3ML SOPN, Inject 1 mg into the skin once a week., Disp: 9 mL, Rfl: 1   solifenacin (VESICARE) 5 MG tablet, Take 1 tablet (5 mg total) by mouth daily., Disp: 90 tablet, Rfl: 1   SYNTHROID 50 MCG tablet, TAKE 1 TABLET DAILY, Disp: 90 tablet, Rfl: 3   vitamin B-12 (CYANOCOBALAMIN) 1000 MCG tablet, Take 1,000 mcg by mouth daily., Disp: , Rfl:    WIXELA INHUB 100-50 MCG/ACT AEPB, Inhale 1 puff into the lungs 2 (two) times daily., Disp: 60 each, Rfl: 1   DULoxetine (CYMBALTA) 30 MG capsule, TAKE 1 CAPSULE DAILY IN THE EVENING, Disp: 90 capsule, Rfl: 3  Current Facility-Administered Medications:    cyanocobalamin ((VITAMIN B-12)) injection 1,000 mcg, 1,000 mcg, Intramuscular, Once, [name], MD   Allergies  Allergen Reactions   No Known Allergies      Review of Systems  Constitutional: Negative.   Respiratory: Negative.    Cardiovascular:  Positive for leg swelling.  Gastrointestinal: Negative.   Skin:  Positive for color  change and wound.  Neurological: Negative.   Psychiatric/Behavioral: Negative.      Today's Vitals   09/10/21 1533  BP: 116/72  Pulse: 84  Temp: 98.6 F (37 C)  Weight: 202 lb 12.8 oz (92 kg)  Height: 5' 6" (1.676 m)  PainSc: 0-No pain   Body mass index is 32.73 kg/m.  Wt Readings from Last 3 Encounters:  09/10/21 202 lb 12.8 oz (92 kg)  05/23/21 204 lb 9.6 oz (92.8 kg)  04/04/21 211 lb 6.4 oz (95.9 kg)     Objective:  Physical Exam Vitals and nursing note reviewed.  Constitutional:      Appearance: Normal appearance.  Eyes:     Extraocular Movements: Extraocular movements intact.  Cardiovascular:     Rate and Rhythm: Normal rate and regular rhythm.     Heart sounds: Normal heart sounds.  Pulmonary:     Effort: Pulmonary effort is normal.     Breath sounds: Normal breath sounds.  Musculoskeletal:     Cervical back: Normal range of motion.     Comments: RLE w/ erythema, area on anterior and lateral shin with black eschar formation. Skin is warm to touch.   Skin:    General: Skin is warm.     Findings: Bruising and erythema present.  Neurological:     General: No focal deficit present.     Mental Status: He is alert.  Psychiatric:        Mood and Affect: Mood normal.        Assessment And Plan:     1. Recurrent cellulitis of lower extremity Comments: ER records reviewed. I will send rx Augmentin x 10 days. He is encouraged to take full abx course. I will also refer him to Wound clinic. - Ambulatory referral to Wound Clinic  2. Right leg swelling Comments: Likely related to cellulitis. CT tib/fib results reviewed from ER visit.    Patient was given opportunity to  ask questions. Patient verbalized understanding of the plan and was able to repeat key elements of the plan. All questions were answered to their satisfaction.   I, Franklin Greenland, MD, have reviewed all documentation for this visit. The documentation on 09/16/21 for the exam, diagnosis, procedures,  and orders are all accurate and complete.   IF YOU HAVE BEEN REFERRED TO A SPECIALIST, IT MAY TAKE 1-2 WEEKS TO SCHEDULE/PROCESS THE REFERRAL. IF YOU HAVE NOT HEARD FROM US/SPECIALIST IN TWO WEEKS, PLEASE GIVE [place] A CALL AT [phone] X [phone].   THE PATIENT IS ENCOURAGED TO PRACTICE SOCIAL DISTANCING DUE TO THE COVID-19 PANDEMIC.

## 2021-09-14 DIAGNOSIS — N5231 Erectile dysfunction following radical prostatectomy: Secondary | ICD-10-CM | POA: Diagnosis not present

## 2021-09-14 DIAGNOSIS — Z8546 Personal history of malignant neoplasm of prostate: Secondary | ICD-10-CM | POA: Diagnosis not present

## 2021-09-14 DIAGNOSIS — N4883 Acquired buried penis: Secondary | ICD-10-CM | POA: Diagnosis not present

## 2021-09-14 DIAGNOSIS — Z9079 Acquired absence of other genital organ(s): Secondary | ICD-10-CM | POA: Diagnosis not present

## 2021-09-14 DIAGNOSIS — N393 Stress incontinence (female) (male): Secondary | ICD-10-CM | POA: Diagnosis not present

## 2021-09-16 ENCOUNTER — Encounter: Payer: Self-pay | Admitting: Internal Medicine

## 2021-09-20 DIAGNOSIS — G4719 Other hypersomnia: Secondary | ICD-10-CM | POA: Diagnosis not present

## 2021-09-20 DIAGNOSIS — M542 Cervicalgia: Secondary | ICD-10-CM | POA: Diagnosis not present

## 2021-09-20 DIAGNOSIS — M25551 Pain in right hip: Secondary | ICD-10-CM | POA: Diagnosis not present

## 2021-09-20 DIAGNOSIS — M545 Low back pain, unspecified: Secondary | ICD-10-CM | POA: Diagnosis not present

## 2021-09-20 DIAGNOSIS — G2581 Restless legs syndrome: Secondary | ICD-10-CM | POA: Diagnosis not present

## 2021-10-02 DIAGNOSIS — L97911 Non-pressure chronic ulcer of unspecified part of right lower leg limited to breakdown of skin: Secondary | ICD-10-CM | POA: Diagnosis not present

## 2021-10-02 DIAGNOSIS — E119 Type 2 diabetes mellitus without complications: Secondary | ICD-10-CM | POA: Diagnosis not present

## 2021-10-02 DIAGNOSIS — I739 Peripheral vascular disease, unspecified: Secondary | ICD-10-CM | POA: Diagnosis not present

## 2021-10-02 DIAGNOSIS — T148XXD Other injury of unspecified body region, subsequent encounter: Secondary | ICD-10-CM | POA: Diagnosis not present

## 2021-10-11 DIAGNOSIS — E1142 Type 2 diabetes mellitus with diabetic polyneuropathy: Secondary | ICD-10-CM | POA: Diagnosis not present

## 2021-10-11 DIAGNOSIS — T8133XD Disruption of traumatic injury wound repair, subsequent encounter: Secondary | ICD-10-CM | POA: Diagnosis not present

## 2021-10-18 DIAGNOSIS — G4736 Sleep related hypoventilation in conditions classified elsewhere: Secondary | ICD-10-CM | POA: Diagnosis not present

## 2021-10-18 DIAGNOSIS — J301 Allergic rhinitis due to pollen: Secondary | ICD-10-CM | POA: Diagnosis not present

## 2021-10-18 DIAGNOSIS — R918 Other nonspecific abnormal finding of lung field: Secondary | ICD-10-CM | POA: Diagnosis not present

## 2021-10-18 DIAGNOSIS — G2581 Restless legs syndrome: Secondary | ICD-10-CM | POA: Diagnosis not present

## 2021-10-18 DIAGNOSIS — J4520 Mild intermittent asthma, uncomplicated: Secondary | ICD-10-CM | POA: Diagnosis not present

## 2021-10-24 ENCOUNTER — Other Ambulatory Visit: Payer: Self-pay | Admitting: Internal Medicine

## 2021-10-24 DIAGNOSIS — E1122 Type 2 diabetes mellitus with diabetic chronic kidney disease: Secondary | ICD-10-CM

## 2021-10-29 ENCOUNTER — Telehealth: Payer: Self-pay

## 2021-10-29 NOTE — Chronic Care Management (AMB) (Signed)
  Chronic Care Management Pharmacy Assistant   Name: Franklin Thornton  MRN: [id number] DOB: 02/05/1946  Reason for Encounter: Disease State/ diabetes  Recent office visits:  09-10-2021 Sanders, Robyn, MD. Visit for swelling of right leg. Referral placed to wound clinic. START augmentin 875-125 mg take 1 tablet twice daily.  Recent consult visits:  09-14-2021 Terlecki, Ryan Patrick, MD (Urology). 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  09-06-2021 Spassova, Roumyana Ivanova, FNP (Endocrinology). Follow up visit no changes.  07-16-2021 Gomadam, Arvind M, MD (Neurology). Nerve conduction study completed.  06-28-2021 Dillard, Cynthia H, RD, LDN (Nutrition). Follow up visit no changes.  06-21-2021 Semble, Elliott L, MD (Rheumatology). Unable to view encounter.  06-20-2021 Runheim, Andreas, MD (Neurology). Unable to view encounter.  06-19-2021 Spassova, Roumyana Ivanova, FNP (Endo). Follow up visit no changes.  Hospital visits:  Medication Reconciliation was completed by comparing discharge summary, patient's EMR and Pharmacy list, and upon discussion with patient.  Admitted to the hospital on 09-08-2021 due to right leg swelling.  Discharge date was 09-08-2021. Discharged from [facility].    New?Medications Started at Hospital Discharge:?? Lasix 20 mg daily for 5 days  Medication Changes at Hospital Discharge: None  Medications Discontinued at Hospital Discharge: None  Medications that remain the same after Hospital Discharge:??  -All other medications will remain the same.    Medications: Outpatient Encounter Medications as of 10/29/2021  Medication Sig   albuterol (PROVENTIL HFA;VENTOLIN HFA) 108 (90 Base) MCG/ACT inhaler Inhale 2 puffs into the lungs every 6 (six) hours as needed for wheezing or shortness of breath.   albuterol (PROVENTIL) (2.5 MG/3ML) 0.083% nebulizer solution Take 3 mLs (2.5 mg total) by nebulization every 6 (six) hours as needed  for wheezing or shortness of breath.   amiodarone (PACERONE) 100 MG tablet Take 100 mg by mouth daily.    amoxicillin-clavulanate (AUGMENTIN) 875-125 MG tablet Take 1 tablet by mouth 2 (two) times daily.   amphetamine-dextroamphetamine (ADDERALL) 10 MG tablet Take 1 tablet by mouth daily. Take 2 tablets in the morning and one tablet in the evenings.   Armodafinil 250 MG tablet Take 125-250 mg by mouth every morning.   aspirin 81 MG tablet Take 81 mg by mouth daily.   atorvastatin (LIPITOR) 20 MG tablet TAKE 1 TABLET DAILY   azelastine (ASTELIN) 0.1 % nasal spray Place 1 spray into both nostrils 2 (two) times daily. Use in each nostril as directed   b complex vitamins capsule Take 1 capsule by mouth daily.   carvedilol (COREG) 6.25 MG tablet Take 6.25 mg by mouth 2 (two) times daily with a meal.   Cholecalciferol (VITAMIN D) 2000 units tablet Take 2,000 Units by mouth daily.   diphenhydrAMINE (BENADRYL) 25 MG tablet Take 25 mg by mouth every 6 (six) hours as needed for itching.    donepezil (ARICEPT) 10 MG tablet Take 1 tablet (10 mg total) by mouth at bedtime.   DULoxetine (CYMBALTA) 30 MG capsule TAKE 1 CAPSULE DAILY IN THE EVENING   furosemide (LASIX) 20 MG tablet Take 20 mg by mouth daily as needed.    glucose blood (FREESTYLE LITE) test strip Use as instructed to check blood sugars  3 times per day dx: e11.65   glucose monitoring kit (FREESTYLE) monitoring kit 1 each by Does not apply route as needed for other.   HYDROcodone-acetaminophen (NORCO/VICODIN) 5-325 MG tablet Take 1 tablet by mouth every 6 (six) hours as needed for moderate pain.   insulin glargine, 2 Unit Dial, (  TOUJEO MAX SOLOSTAR) 300 UNIT/ML Solostar Pen Inject 13 Units into the skin at bedtime.   Insulin Pen Needle (NOVOFINE PEN NEEDLE) 32G X 6 MM MISC Use with insulin pens   ipratropium (ATROVENT) 0.03 % nasal spray Place 2 sprays into both nostrils every 12 (twelve) hours.   JARDIANCE 10 MG TABS tablet TAKE 1 TABLET DAILY    levocetirizine (XYZAL) 5 MG tablet Take 5 mg by mouth every evening.    lidocaine (LIDODERM) 5 % Place 3 patches onto the skin daily. May use 1-3 patches at one time, Remove & Discard patch within 12 hours or as directed by MD   losartan (COZAAR) 50 MG tablet Take 50 mg by mouth 2 (two) times daily.    Magnesium 250 MG TABS Take 1 tablet by mouth daily.   meloxicam (MOBIC) 15 MG tablet Take 15 mg by mouth daily.   methocarbamol (ROBAXIN) 750 MG tablet Take 750 mg by mouth 3 (three) times daily as needed for muscle spasms.    mirabegron ER (MYRBETRIQ) 50 MG TB24 tablet Take 50 mg by mouth daily.   montelukast (SINGULAIR) 10 MG tablet Take 10 mg by mouth at bedtime.   Multiple Vitamin (MULTIVITAMIN) tablet Take 1 tablet by mouth daily.   nitroGLYCERIN (NITROSTAT) 0.4 MG SL tablet Place 0.4 mg under the tongue every 5 (five) minutes as needed for chest pain.    Omega-3 Fatty Acids (FISH OIL) 1200 MG CAPS Take 1,200 mg by mouth.    pantoprazole (PROTONIX) 40 MG tablet TAKE 1 TABLET DAILY   pramipexole (MIRAPEX) 0.5 MG tablet Take 2 tablets (1 mg total) by mouth in the morning and at bedtime. (Patient taking differently: Take 1 mg by mouth in the morning and at bedtime. One in the morning one in the afternoon and 2 at night.)   rivaroxaban (XARELTO) 10 MG TABS tablet Take 10 mg by mouth daily.   Semaglutide, 1 MG/DOSE, (OZEMPIC, 1 MG/DOSE,) 4 MG/3ML SOPN Inject 1 mg into the skin once a week.   solifenacin (VESICARE) 5 MG tablet Take 1 tablet (5 mg total) by mouth daily.   SYNTHROID 50 MCG tablet TAKE 1 TABLET DAILY   vitamin B-12 (CYANOCOBALAMIN) 1000 MCG tablet Take 1,000 mcg by mouth daily.   WIXELA INHUB 100-50 MCG/ACT AEPB Inhale 1 puff into the lungs 2 (two) times daily.   Facility-Administered Encounter Medications as of 10/29/2021  Medication   cyanocobalamin ((VITAMIN B-12)) injection 1,000 mcg   Recent Relevant Labs: Lab Results  Component Value Date/Time   HGBA1C 7.7 (H) 05/23/2021  04:14 PM   HGBA1C 10.6 (H) 03/08/2021 04:14 PM   MICROALBUR 30 07/30/2018 12:32 PM    Kidney Function Lab Results  Component Value Date/Time   CREATININE 0.72 (L) 05/23/2021 04:14 PM   CREATININE 0.86 03/08/2021 04:14 PM   GFRNONAA 68 03/14/2020 02:46 PM   GFRAA 79 03/14/2020 02:46 PM    Current antihyperglycemic regimen:  Jardiance 10 mg daily Toujeo 13 units at bedtime Ozempic 1 mg weekly  What recent interventions/DTPs have been made to improve glycemic control:  Educated on A1c and blood sugar goals; Complications of diabetes including kidney damage, retinal damage, and cardiovascular disease; Prevention and management of hypoglycemic episodes; Benefits of routine self-monitoring of blood sugar; -Counseled to check feet daily and get yearly eye exams -Recommended to continue current medication  Have there been any recent hospitalizations or ED visits since last visit with [facility]? Yes  Patient denies hypoglycemic symptoms  Patient denies hyperglycemic symptoms    How often are you checking your blood sugar? Patient uses dexcom  What are your blood sugars ranging? Patient stated between 132-160  During the week, how often does your blood glucose drop below 70? Never  Are you checking your feet daily/regularly? Daily  Adherence Review: Is the patient currently on a STATIN medication? Yes Is the patient currently on ACE/ARB medication? Yes Does the patient have >5 day gap between last estimated fill dates? Yes  Care Gaps: Covid booster overdue Yearly ophthalmology overdue AWV 01-10-2022  Star Rating Drugs: Losartan 50 mg- Last filled 07-23-2021 [facility] (Patient reported receiving a 90 DS a few days ago) Jardiance 10 mg- Last filled 10-24-2021 [facility] Atorvastatin 20 mg- Last filled 10-02-2021 [facility] Ozempic 2 mg- Last filled 09-08-2021 [facility] (patient reports having 1 pen left)  [place]  Pharmacist Assistant [phone]

## 2021-11-01 DIAGNOSIS — G6289 Other specified polyneuropathies: Secondary | ICD-10-CM | POA: Diagnosis not present

## 2021-11-01 DIAGNOSIS — G4733 Obstructive sleep apnea (adult) (pediatric): Secondary | ICD-10-CM | POA: Diagnosis not present

## 2021-11-01 DIAGNOSIS — G2581 Restless legs syndrome: Secondary | ICD-10-CM | POA: Diagnosis not present

## 2021-11-01 DIAGNOSIS — M545 Low back pain, unspecified: Secondary | ICD-10-CM | POA: Diagnosis not present

## 2021-11-01 DIAGNOSIS — G4719 Other hypersomnia: Secondary | ICD-10-CM | POA: Diagnosis not present

## 2021-11-05 NOTE — Chronic Care Management (AMB) (Signed)
11-05-2021: [facility]ed [facility] on [facility] day [facility] and was told Losartan was delivered on 11-03-2021 90 DS and Ozempic filled on 10-26-2021 28 DS.   [place] Pharmacist Assistant [phone]

## 2021-11-07 DIAGNOSIS — H9313 Tinnitus, bilateral: Secondary | ICD-10-CM | POA: Diagnosis not present

## 2021-11-07 DIAGNOSIS — R2689 Other abnormalities of gait and mobility: Secondary | ICD-10-CM | POA: Diagnosis not present

## 2021-11-07 DIAGNOSIS — H903 Sensorineural hearing loss, bilateral: Secondary | ICD-10-CM | POA: Diagnosis not present

## 2021-11-15 ENCOUNTER — Encounter: Payer: Self-pay | Admitting: Internal Medicine

## 2021-11-15 ENCOUNTER — Ambulatory Visit (INDEPENDENT_AMBULATORY_CARE_PROVIDER_SITE_OTHER): Payer: Medicare Other | Admitting: Internal Medicine

## 2021-11-15 VITALS — BP 140/90 | HR 64 | Temp 98.1°F | Ht 66.0 in | Wt 205.0 lb

## 2021-11-15 DIAGNOSIS — R2689 Other abnormalities of gait and mobility: Secondary | ICD-10-CM

## 2021-11-15 DIAGNOSIS — N182 Chronic kidney disease, stage 2 (mild): Secondary | ICD-10-CM

## 2021-11-15 DIAGNOSIS — Z6833 Body mass index (BMI) 33.0-33.9, adult: Secondary | ICD-10-CM

## 2021-11-15 DIAGNOSIS — E1122 Type 2 diabetes mellitus with diabetic chronic kidney disease: Secondary | ICD-10-CM | POA: Diagnosis not present

## 2021-11-15 DIAGNOSIS — E6609 Other obesity due to excess calories: Secondary | ICD-10-CM | POA: Diagnosis not present

## 2021-11-15 DIAGNOSIS — E039 Hypothyroidism, unspecified: Secondary | ICD-10-CM

## 2021-11-15 LAB — POCT URINALYSIS DIPSTICK
Blood, UA: NEGATIVE
Glucose, UA: POSITIVE — AB
Ketones, UA: NEGATIVE
Leukocytes, UA: NEGATIVE
Nitrite, UA: NEGATIVE
Protein, UA: NEGATIVE
Spec Grav, UA: 1.02 (ref 1.010–1.025)
Urobilinogen, UA: 0.2 E.U./dL
pH, UA: 5 (ref 5.0–8.0)

## 2021-11-15 NOTE — Progress Notes (Signed)
[name],acting as a Education administrator for [name], MD.,have documented all relevant documentation on the behalf of [name], MD,as directed by  [name], MD while in the presence of [name], MD.    Subjective:     Patient ID: Franklin Thornton , male    DOB: 04-Nov-1945 , 76 y.o.   MRN: [id number]   Chief Complaint  Patient presents with   lack of balance    HPI  Patient presents today for his balance. He reports he recently went to the ENT thinking he was having difficulty with his balance due to his inner ear, but the ENT said everything was fine.  He was told he is off balance due to all the meds he is on.      Past Medical History:  Diagnosis Date   Atrial fibrillation ([place])    Diabetes mellitus without complication ([place])    Peripheral vascular disease ([place])    Prostate cancer ([place])    Uvular edema 07/17/2015     Family History  Problem Relation Age of Onset   Lung cancer Mother    Diabetes Mother    Esophageal cancer Father    Hypertension Sister    Breast cancer Sister    Healthy Son    Healthy Daughter      Current Outpatient Medications:    albuterol (PROVENTIL HFA;VENTOLIN HFA) 108 (90 Base) MCG/ACT inhaler, Inhale 2 puffs into the lungs every 6 (six) hours as needed for wheezing or shortness of breath., Disp: , Rfl:    albuterol (PROVENTIL) (2.5 MG/3ML) 0.083% nebulizer solution, Take 3 mLs (2.5 mg total) by nebulization every 6 (six) hours as needed for wheezing or shortness of breath., Disp: 75 mL, Rfl: 3   amiodarone (PACERONE) 100 MG tablet, Take 100 mg by mouth daily. , Disp: , Rfl:    amoxicillin-clavulanate (AUGMENTIN) 875-125 MG tablet, Take 1 tablet by mouth 2 (two) times daily., Disp: 20 tablet, Rfl: 0   amphetamine-dextroamphetamine (ADDERALL) 10 MG tablet, Take 1 tablet by mouth daily. Take 2 tablets in the morning and one tablet in the evenings., Disp: , Rfl:    Armodafinil 250 MG tablet, Take 125-250 mg by mouth every  morning., Disp: , Rfl:    aspirin 81 MG tablet, Take 81 mg by mouth daily., Disp: , Rfl:    atorvastatin (LIPITOR) 20 MG tablet, TAKE 1 TABLET DAILY, Disp: 90 tablet, Rfl: 3   azelastine (ASTELIN) 0.1 % nasal spray, Place 1 spray into both nostrils 2 (two) times daily. Use in each nostril as directed, Disp: 30 mL, Rfl: 12   b complex vitamins capsule, Take 1 capsule by mouth daily., Disp: , Rfl:    carvedilol (COREG) 6.25 MG tablet, Take 6.25 mg by mouth 2 (two) times daily with a meal., Disp: , Rfl:    Cholecalciferol (VITAMIN D) 2000 units tablet, Take 2,000 Units by mouth daily., Disp: , Rfl:    diphenhydrAMINE (BENADRYL) 25 MG tablet, Take 25 mg by mouth every 6 (six) hours as needed for itching. , Disp: , Rfl:    donepezil (ARICEPT) 10 MG tablet, Take 1 tablet (10 mg total) by mouth at bedtime., Disp: 90 tablet, Rfl: 3   DULoxetine (CYMBALTA) 30 MG capsule, TAKE 1 CAPSULE DAILY IN THE EVENING, Disp: 90 capsule, Rfl: 3   furosemide (LASIX) 20 MG tablet, Take 20 mg by mouth daily as needed. , Disp: , Rfl:    glucose blood (FREESTYLE LITE) test strip,  Use as instructed to check blood sugars  3 times per day dx: e11.65, Disp: 300 each, Rfl: 1   glucose monitoring kit (FREESTYLE) monitoring kit, 1 each by Does not apply route as needed for other., Disp: , Rfl:    HYDROcodone-acetaminophen (NORCO/VICODIN) 5-325 MG tablet, Take 1 tablet by mouth every 6 (six) hours as needed for moderate pain., Disp: , Rfl:    insulin glargine, 2 Unit Dial, (TOUJEO MAX SOLOSTAR) 300 UNIT/ML Solostar Pen, Inject 13 Units into the skin at bedtime., Disp: , Rfl:    Insulin Pen Needle (NOVOFINE PEN NEEDLE) 32G X 6 MM MISC, Use with insulin pens, Disp: 300 each, Rfl: 3   ipratropium (ATROVENT) 0.03 % nasal spray, Place 2 sprays into both nostrils every 12 (twelve) hours., Disp: , Rfl:    JARDIANCE 10 MG TABS tablet, TAKE 1 TABLET DAILY, Disp: 90 tablet, Rfl: 3   levocetirizine (XYZAL) 5 MG tablet, Take 5 mg by mouth every  evening. , Disp: , Rfl:    lidocaine (LIDODERM) 5 %, Place 3 patches onto the skin daily. May use 1-3 patches at one time, Remove & Discard patch within 12 hours or as directed by MD, Disp: 90 patch, Rfl: 0   losartan (COZAAR) 50 MG tablet, Take 50 mg by mouth 2 (two) times daily. , Disp: , Rfl:    Magnesium 250 MG TABS, Take 1 tablet by mouth daily., Disp: , Rfl:    meloxicam (MOBIC) 15 MG tablet, Take 15 mg by mouth daily., Disp: , Rfl:    methocarbamol (ROBAXIN) 750 MG tablet, Take 750 mg by mouth 3 (three) times daily as needed for muscle spasms. , Disp: , Rfl:    mirabegron ER (MYRBETRIQ) 50 MG TB24 tablet, Take 50 mg by mouth daily., Disp: , Rfl:    montelukast (SINGULAIR) 10 MG tablet, Take 10 mg by mouth at bedtime., Disp: , Rfl:    Multiple Vitamin (MULTIVITAMIN) tablet, Take 1 tablet by mouth daily., Disp: , Rfl:    nitroGLYCERIN (NITROSTAT) 0.4 MG SL tablet, Place 0.4 mg under the tongue every 5 (five) minutes as needed for chest pain. , Disp: , Rfl:    Omega-3 Fatty Acids (FISH OIL) 1200 MG CAPS, Take 1,200 mg by mouth. , Disp: , Rfl:    pantoprazole (PROTONIX) 40 MG tablet, TAKE 1 TABLET DAILY, Disp: 90 tablet, Rfl: 3   pramipexole (MIRAPEX) 0.5 MG tablet, Take 2 tablets (1 mg total) by mouth in the morning and at bedtime. (Patient taking differently: Take 1 mg by mouth in the morning and at bedtime. One in the morning one in the afternoon and 2 at night.), Disp: 90 tablet, Rfl: 1   rivaroxaban (XARELTO) 10 MG TABS tablet, Take 10 mg by mouth daily., Disp: , Rfl:    Semaglutide, 1 MG/DOSE, (OZEMPIC, 1 MG/DOSE,) 4 MG/3ML SOPN, Inject 1 mg into the skin once a week., Disp: 9 mL, Rfl: 1   solifenacin (VESICARE) 5 MG tablet, Take 1 tablet (5 mg total) by mouth daily., Disp: 90 tablet, Rfl: 1   SYNTHROID 50 MCG tablet, TAKE 1 TABLET DAILY, Disp: 90 tablet, Rfl: 3   vitamin B-12 (CYANOCOBALAMIN) 1000 MCG tablet, Take 1,000 mcg by mouth daily., Disp: , Rfl:    WIXELA INHUB 100-50 MCG/ACT  AEPB, Inhale 1 puff into the lungs 2 (two) times daily., Disp: 60 each, Rfl: 1  Current Facility-Administered Medications:    cyanocobalamin ((VITAMIN B-12)) injection 1,000 mcg, 1,000 mcg, Intramuscular, Once, [name], MD  Allergies  Allergen Reactions   No Known Allergies      Review of Systems  Constitutional: Negative.   Respiratory: Negative.    Cardiovascular: Negative.   Gastrointestinal: Negative.   Neurological:  Positive for dizziness.  Psychiatric/Behavioral: Negative.       Today's Vitals   11/15/21 1057  BP: 140/90  Pulse: 64  Temp: 98.1 F (36.7 C)  Weight: 205 lb (93 kg)  Height: _0  (1.676 m)  PainSc: 0-No pain   Body mass index is 33.09 kg/m.  Wt Readings from Last 3 Encounters:  11/15/21 205 lb (93 kg)  09/10/21 202 lb 12.8 oz (92 kg)  05/23/21 204 lb 9.6 oz (92.8 kg)     Objective:  Physical Exam Vitals and nursing note reviewed.  Constitutional:      Appearance: Normal appearance. He is obese.  HENT:     Head: Normocephalic and atraumatic.  Eyes:     Extraocular Movements: Extraocular movements intact.  Cardiovascular:     Rate and Rhythm: Normal rate and regular rhythm.     Heart sounds: Normal heart sounds.  Pulmonary:     Effort: Pulmonary effort is normal.     Breath sounds: Normal breath sounds.  Abdominal:     Comments: ROUNDED, NABS  Musculoskeletal:     Cervical back: Normal range of motion.  Skin:    General: Skin is warm.  Neurological:     General: No focal deficit present.     Mental Status: He is alert.  Psychiatric:        Mood and Affect: Mood normal.      Assessment And Plan:     1. 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 Otherwise normal MRI. Feb 2023 Neuro note reviewed. NCV results significant for 1. 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  Pt advised this is likely contributing to his sx.   2. Type 2 diabetes mellitus with stage 2 chronic kidney disease, without long-term current use of insulin (HCC) Comments: Chronic, imbalance likely partly due to diabetic neuropathy.  I will check labs as below. I will adjust meds as needed. He will c/w Jardiance 108m daily and Ozempic 127mweekly. He will rto in 3-4 months for re-evaluation.  - Urine microalbumin-creatinine with uACR - Hemoglobin A1c - BMP8+eGFR - POCT Urinalysis Dipstick (81002)  3. Acquired hypothyroidism Comments: Chronic, I will check thyroid panel and adjust meds as needed.  - TSH + free T4  4. Class 1 obesity due to excess calories with serious comorbidity and body mass index (BMI) of 33.0 to 33.9 in adult Comments: He is encouraged to aim for at least 150 minutes of exercise per week.    Patient was given opportunity to ask questions. Patient verbalized understanding of the plan and was able to repeat key elements of the plan. All questions were answered to their satisfaction.   I, RoMaximino GreenlandMD, have reviewed all documentation for this visit. The documentation on 11/15/21 for the exam, diagnosis, procedures, and orders are all accurate and complete.   IF YOU HAVE BEEN REFERRED TO A SPECIALIST, IT MAY TAKE 1-2 WEEKS TO SCHEDULE/PROCESS THE REFERRAL. IF YOU HAVE NOT HEARD FROM US/SPECIALIST IN TWO WEEKS, PLEASE GIVE [place] CALL AT [phone].   THE PATIENT IS ENCOURAGED TO PRACTICE SOCIAL DISTANCING DUE TO THE COVID-19 PANDEMIC.

## 2021-11-15 NOTE — Patient Instructions (Addendum)
STOP THESE MEDS  STOP BANOPHEN, XYZAL STOP MELOXICAM AND ADVIL    Dizziness Dizziness is a common problem. It makes you feel unsteady or light-headed. You may feel like you are about to pass out (faint). Dizziness can lead to getting hurt if you stumble or fall. Dizziness can be caused by many things, including: Medicines. Not having enough water in your body (dehydration). Illness. Follow these instructions at home: Eating and drinking  Drink enough fluid to keep your pee (urine) pale yellow. This helps to keep you from getting dehydrated. Try to drink more clear fluids, such as water. Do not drink alcohol. Limit how much caffeine you drink or eat, if your doctor tells you to do that. Limit how much salt (sodium) you drink or eat, if your doctor tells you to do that. Activity pibName

## 2021-11-16 LAB — BMP8+EGFR
BUN/Creatinine Ratio: 38 — ABNORMAL HIGH (ref 10–24)
BUN: 30 mg/dL — ABNORMAL HIGH (ref 8–27)
CO2: 23 mmol/L (ref 20–29)
Calcium: 9.4 mg/dL (ref 8.6–10.2)
Chloride: 103 mmol/L (ref 96–106)
Creatinine, Ser: 0.8 mg/dL (ref 0.76–1.27)
Glucose: 149 mg/dL — ABNORMAL HIGH (ref 70–99)
Potassium: 4.8 mmol/L (ref 3.5–5.2)
Sodium: 143 mmol/L (ref 134–144)
eGFR: 92 mL/min/{1.73_m2} (ref >59)

## 2021-11-16 LAB — MICROALBUMIN / CREATININE URINE RATIO
Creatinine, Urine: 76.8 mg/dL
Microalb/Creat Ratio: 36 mg/g creat — ABNORMAL HIGH (ref 0–29)
Microalbumin, Urine: 27.6 ug/mL

## 2021-11-16 LAB — HEMOGLOBIN A1C
Est. average glucose Bld gHb Est-mCnc: 151 mg/dL
Hgb A1c MFr Bld: 6.9 % — ABNORMAL HIGH (ref 4.8–5.6)

## 2021-11-16 LAB — TSH+FREE T4
Free T4: 1.62 ng/dL (ref 0.82–1.77)
TSH: 3.33 u[IU]/mL (ref 0.450–4.500)

## 2021-11-22 DIAGNOSIS — G4736 Sleep related hypoventilation in conditions classified elsewhere: Secondary | ICD-10-CM | POA: Diagnosis not present

## 2021-11-22 DIAGNOSIS — N393 Stress incontinence (female) (male): Secondary | ICD-10-CM | POA: Diagnosis not present

## 2021-11-29 ENCOUNTER — Telehealth: Payer: Self-pay

## 2021-11-29 NOTE — Chronic Care Management (AMB) (Signed)
Chronic Care Management Pharmacy Assistant   Name: Franklin Thornton  MRN: [id number] DOB: [birth year]   Reason for Encounter: Disease State/ Diabetes  Recent office visits:  11-15-2021 [name], MD. Glucose= 149, BUN= 30, BUN/Creatinine= 38. A1C= 6.9. Positive Glucose, UA. Microalb/Creat Ratio= 36.   Recent consult visits:  11-07-2021 [name], MD (ENT). STOP Astelin, Banophen, and Xyzal.   Hospital visits:  Medication Reconciliation was completed by comparing discharge summary, patient's EMR and Pharmacy list, and upon discussion with patient.   Admitted to the hospital on 09-08-2021 due to right leg swelling.  Discharge date was 09-08-2021. Discharged from [facility].     New?Medications Started at [facility] Discharge:?? Lasix 20 mg daily for 5 days   Medication Changes at Hospital Discharge: None   Medications Discontinued at Hospital Discharge: None   Medications that remain the same after Hospital Discharge:??  -All other medications will remain the same.      Medications: Outpatient Encounter Medications as of 11/29/2021  Medication Sig   albuterol (PROVENTIL HFA;VENTOLIN HFA) 108 (90 Base) MCG/ACT inhaler Inhale 2 puffs into the lungs every 6 (six) hours as needed for wheezing or shortness of breath.   albuterol (PROVENTIL) (2.5 MG/3ML) 0.083% nebulizer solution Take 3 mLs (2.5 mg total) by nebulization every 6 (six) hours as needed for wheezing or shortness of breath.   amiodarone (PACERONE) 100 MG tablet Take 100 mg by mouth daily.    amoxicillin-clavulanate (AUGMENTIN) 875-125 MG tablet Take 1 tablet by mouth 2 (two) times daily.   amphetamine-dextroamphetamine (ADDERALL) 10 MG tablet Take 1 tablet by mouth daily. Take 2 tablets in the morning and one tablet in the evenings.   Armodafinil 250 MG tablet Take 125-250 mg by mouth every morning.   aspirin 81 MG tablet Take 81 mg by mouth daily.   atorvastatin (LIPITOR) 20 MG tablet TAKE 1 TABLET  DAILY   azelastine (ASTELIN) 0.1 % nasal spray Place 1 spray into both nostrils 2 (two) times daily. Use in each nostril as directed   b complex vitamins capsule Take 1 capsule by mouth daily.   carvedilol (COREG) 6.25 MG tablet Take 6.25 mg by mouth 2 (two) times daily with a meal.   Cholecalciferol (VITAMIN D) 2000 units tablet Take 2,000 Units by mouth daily.   diphenhydrAMINE (BENADRYL) 25 MG tablet Take 25 mg by mouth every 6 (six) hours as needed for itching.    donepezil (ARICEPT) 10 MG tablet Take 1 tablet (10 mg total) by mouth at bedtime.   DULoxetine (CYMBALTA) 30 MG capsule TAKE 1 CAPSULE DAILY IN THE EVENING   furosemide (LASIX) 20 MG tablet Take 20 mg by mouth daily as needed.    glucose blood (FREESTYLE LITE) test strip Use as instructed to check blood sugars  3 times per day dx: e11.65   glucose monitoring kit (FREESTYLE) monitoring kit 1 each by Does not apply route as needed for other.   HYDROcodone-acetaminophen (NORCO/VICODIN) 5-325 MG tablet Take 1 tablet by mouth every 6 (six) hours as needed for moderate pain.   insulin glargine, 2 Unit Dial, (TOUJEO MAX SOLOSTAR) 300 UNIT/ML Solostar Pen Inject 13 Units into the skin at bedtime.   Insulin Pen Needle (NOVOFINE PEN NEEDLE) 32G X 6 MM MISC Use with insulin pens   ipratropium (ATROVENT) 0.03 % nasal spray Place 2 sprays into both nostrils every 12 (twelve) hours.   JARDIANCE 10 MG TABS tablet TAKE 1 TABLET DAILY   levocetirizine (XYZAL) 5 MG  tablet Take 5 mg by mouth every evening.    lidocaine (LIDODERM) 5 % Place 3 patches onto the skin daily. May use 1-3 patches at one time, Remove & Discard patch within 12 hours or as directed by MD   losartan (COZAAR) 50 MG tablet Take 50 mg by mouth 2 (two) times daily.    Magnesium 250 MG TABS Take 1 tablet by mouth daily.   meloxicam (MOBIC) 15 MG tablet Take 15 mg by mouth daily.   methocarbamol (ROBAXIN) 750 MG tablet Take 750 mg by mouth 3 (three) times daily as needed for muscle  spasms.    mirabegron ER (MYRBETRIQ) 50 MG TB24 tablet Take 50 mg by mouth daily.   montelukast (SINGULAIR) 10 MG tablet Take 10 mg by mouth at bedtime.   Multiple Vitamin (MULTIVITAMIN) tablet Take 1 tablet by mouth daily.   nitroGLYCERIN (NITROSTAT) 0.4 MG SL tablet Place 0.4 mg under the tongue every 5 (five) minutes as needed for chest pain.    Omega-3 Fatty Acids (FISH OIL) 1200 MG CAPS Take 1,200 mg by mouth.    pantoprazole (PROTONIX) 40 MG tablet TAKE 1 TABLET DAILY   pramipexole (MIRAPEX) 0.5 MG tablet Take 2 tablets (1 mg total) by mouth in the morning and at bedtime. (Patient taking differently: Take 1 mg by mouth in the morning and at bedtime. One in the morning one in the afternoon and 2 at night.)   rivaroxaban (XARELTO) 10 MG TABS tablet Take 10 mg by mouth daily.   Semaglutide, 1 MG/DOSE, (OZEMPIC, 1 MG/DOSE,) 4 MG/3ML SOPN Inject 1 mg into the skin once a week.   solifenacin (VESICARE) 5 MG tablet Take 1 tablet (5 mg total) by mouth daily.   SYNTHROID 50 MCG tablet TAKE 1 TABLET DAILY   vitamin B-12 (CYANOCOBALAMIN) 1000 MCG tablet Take 1,000 mcg by mouth daily.   WIXELA INHUB 100-50 MCG/ACT AEPB Inhale 1 puff into the lungs 2 (two) times daily.   Facility-Administered Encounter Medications as of 11/29/2021  Medication   cyanocobalamin ((VITAMIN B-12)) injection 1,000 mcg  Recent Relevant Labs: Lab Results  Component Value Date/Time   HGBA1C 6.9 (H) 11/15/2021 11:49 AM   HGBA1C 7.7 (H) 05/23/2021 04:14 PM   MICROALBUR 30 07/30/2018 12:32 PM    Kidney Function Lab Results  Component Value Date/Time   CREATININE 0.80 11/15/2021 11:49 AM   CREATININE 0.72 (L) 05/23/2021 04:14 PM   GFRNONAA 68 03/14/2020 02:46 PM   GFRAA 79 03/14/2020 02:46 PM    Current antihyperglycemic regimen:  Jardiance 10 mg daily Lantus 24 units nightly Ozempic 1 mg weekly  What recent interventions/DTPs have been made to improve glycemic control:  Educated on A1c and blood sugar  goals; Complications of diabetes including kidney damage, retinal damage, and cardiovascular disease; Prevention and management of hypoglycemic episodes; Benefits of routine self-monitoring of blood sugar; -Counseled to check feet daily and get yearly eye exams -Recommended to continue current medication  Have there been any recent hospitalizations or ED visits since last visit with CPP? Yes  Patient denies hypoglycemic symptoms  Patient denies hyperglycemic symptoms  How often are you checking your blood sugar? Patient uses dexcom  What are your blood sugars ranging?  Fasting: 08-10 102, 08-09 170 Before meals: None After meals: 08-10 118, 08-09 317 Bedtime: None  During the week, how often does your blood glucose drop below 70? Never  Are you checking your feet daily/regularly? Daily  Adherence Review: Is the patient currently on a STATIN medication?  Yes Is the patient currently on ACE/ARB medication? Yes Does the patient have >5 day gap between last estimated fill dates? No  NOTES: Patient stated he was switched from toujeo to Lantus 24 units nightly due to insurance coverage.   Care Gaps: Covid booster overdue Yearly foot exam overdue Flu vaccine overdue AWV 01-10-2022  Star Rating Drugs: Losartan 50 mg- Last filled 10-31-2021 [facility]  Jardiance 10 mg- Last filled 10-24-2021 [facility] Atorvastatin 20 mg- Last filled 10-02-2021 [facility] Ozempic 2 mg- Last filled 10-24-2021 [facility]   [place] Pharmacist Assistant [phone]

## 2021-12-03 DIAGNOSIS — L03115 Cellulitis of right lower limb: Secondary | ICD-10-CM | POA: Diagnosis not present

## 2021-12-07 DIAGNOSIS — I152 Hypertension secondary to endocrine disorders: Secondary | ICD-10-CM | POA: Diagnosis not present

## 2021-12-07 DIAGNOSIS — E785 Hyperlipidemia, unspecified: Secondary | ICD-10-CM | POA: Diagnosis not present

## 2021-12-07 DIAGNOSIS — Z794 Long term (current) use of insulin: Secondary | ICD-10-CM | POA: Diagnosis not present

## 2021-12-07 DIAGNOSIS — E1169 Type 2 diabetes mellitus with other specified complication: Secondary | ICD-10-CM | POA: Diagnosis not present

## 2021-12-07 DIAGNOSIS — E1140 Type 2 diabetes mellitus with diabetic neuropathy, unspecified: Secondary | ICD-10-CM | POA: Diagnosis not present

## 2021-12-07 DIAGNOSIS — I2510 Atherosclerotic heart disease of native coronary artery without angina pectoris: Secondary | ICD-10-CM | POA: Diagnosis not present

## 2021-12-07 DIAGNOSIS — E1165 Type 2 diabetes mellitus with hyperglycemia: Secondary | ICD-10-CM | POA: Diagnosis not present

## 2021-12-07 DIAGNOSIS — E1142 Type 2 diabetes mellitus with diabetic polyneuropathy: Secondary | ICD-10-CM | POA: Diagnosis not present

## 2021-12-07 DIAGNOSIS — E1159 Type 2 diabetes mellitus with other circulatory complications: Secondary | ICD-10-CM | POA: Diagnosis not present

## 2021-12-18 DIAGNOSIS — N5231 Erectile dysfunction following radical prostatectomy: Secondary | ICD-10-CM | POA: Diagnosis not present

## 2021-12-18 DIAGNOSIS — R82998 Other abnormal findings in urine: Secondary | ICD-10-CM | POA: Diagnosis not present

## 2021-12-18 DIAGNOSIS — N393 Stress incontinence (female) (male): Secondary | ICD-10-CM | POA: Diagnosis not present

## 2021-12-18 DIAGNOSIS — Z8546 Personal history of malignant neoplasm of prostate: Secondary | ICD-10-CM | POA: Diagnosis not present

## 2021-12-18 DIAGNOSIS — R338 Other retention of urine: Secondary | ICD-10-CM | POA: Diagnosis not present

## 2021-12-18 DIAGNOSIS — R800 Isolated proteinuria: Secondary | ICD-10-CM | POA: Diagnosis not present

## 2021-12-18 DIAGNOSIS — N4883 Acquired buried penis: Secondary | ICD-10-CM | POA: Diagnosis not present

## 2021-12-18 DIAGNOSIS — R339 Retention of urine, unspecified: Secondary | ICD-10-CM | POA: Diagnosis not present

## 2022-01-04 ENCOUNTER — Other Ambulatory Visit: Payer: Self-pay | Admitting: Internal Medicine

## 2022-01-10 ENCOUNTER — Ambulatory Visit (INDEPENDENT_AMBULATORY_CARE_PROVIDER_SITE_OTHER): Payer: Medicare Other

## 2022-01-10 ENCOUNTER — Ambulatory Visit: Payer: Medicare Other | Admitting: Internal Medicine

## 2022-01-10 VITALS — BP 126/68 | HR 83 | Temp 97.9°F | Ht 71.0 in | Wt 194.0 lb

## 2022-01-10 DIAGNOSIS — G2581 Restless legs syndrome: Secondary | ICD-10-CM | POA: Diagnosis not present

## 2022-01-10 DIAGNOSIS — Z0000 Encounter for general adult medical examination without abnormal findings: Secondary | ICD-10-CM

## 2022-01-10 DIAGNOSIS — Z23 Encounter for immunization: Secondary | ICD-10-CM

## 2022-01-10 DIAGNOSIS — G4736 Sleep related hypoventilation in conditions classified elsewhere: Secondary | ICD-10-CM | POA: Diagnosis not present

## 2022-01-10 DIAGNOSIS — J45909 Unspecified asthma, uncomplicated: Secondary | ICD-10-CM | POA: Diagnosis not present

## 2022-01-10 DIAGNOSIS — R918 Other nonspecific abnormal finding of lung field: Secondary | ICD-10-CM | POA: Diagnosis not present

## 2022-01-10 DIAGNOSIS — J4520 Mild intermittent asthma, uncomplicated: Secondary | ICD-10-CM | POA: Diagnosis not present

## 2022-01-10 DIAGNOSIS — J301 Allergic rhinitis due to pollen: Secondary | ICD-10-CM | POA: Diagnosis not present

## 2022-01-10 NOTE — Progress Notes (Signed)
Subjective:   EMAAD NANNA is a 76 y.o. male who presents for Medicare Annual/Subsequent preventive examination.  Review of Systems     Cardiac Risk Factors include: advanced age (>3mn, >>41women);diabetes mellitus;dyslipidemia;hypertension;male gender     Objective:    Today's Vitals   01/10/22 1401  BP: 126/68  Pulse: 83  Temp: 97.9 F (36.6 C)  TempSrc: Oral  SpO2: 97%  Weight: 194 lb (88 kg)  Height: _0  (1.803 m)   Body mass index is 27.06 kg/m.     01/10/2022    2:12 PM 12/07/2020    9:20 AM 11/24/2019    9:04 AM 11/18/2018    9:21 AM 08/28/2017    9:26 AM 07/28/2017    2:11 PM 08/14/2015   12:57 PM  Advanced Directives  Does Patient Have a Medical Advance Directive? Yes No Yes Yes No No No  Type of [place]  [place] Chart? No - copy requested  No - copy requested No - copy requested     Would patient like information on creating a medical advance directive?      Yes (MAU/Ambulatory/Procedural Areas - Information given) No - patient declined information    Current Medications (verified) Outpatient Encounter Medications as of 01/10/2022  Medication Sig   albuterol (PROVENTIL HFA;VENTOLIN HFA) 108 (90 Base) MCG/ACT inhaler Inhale 2 puffs into the lungs every 6 (six) hours as needed for wheezing or shortness of breath.   albuterol (PROVENTIL) (2.5 MG/3ML) 0.083% nebulizer solution Take 3 mLs (2.5 mg total) by nebulization every 6 (six) hours as needed for wheezing or shortness of breath.   amiodarone (PACERONE) 100 MG tablet Take 100 mg by mouth daily.    amphetamine-dextroamphetamine (ADDERALL) 10 MG tablet Take 1 tablet by mouth daily. Take 2 tablets in the morning and one tablet in the evenings.   Armodafinil 250 MG tablet Take 125-250 mg by mouth every morning.   aspirin 81 MG tablet Take 81 mg by mouth  daily.   atorvastatin (LIPITOR) 20 MG tablet TAKE 1 TABLET DAILY   azelastine (ASTELIN) 0.1 % nasal spray Place 1 spray into both nostrils 2 (two) times daily. Use in each nostril as directed   b complex vitamins capsule Take 1 capsule by mouth daily.   carvedilol (COREG) 6.25 MG tablet Take 6.25 mg by mouth 2 (two) times daily with a meal.   Cholecalciferol (VITAMIN D) 2000 units tablet Take 2,000 Units by mouth daily.   diphenhydrAMINE (BENADRYL) 25 MG tablet Take 25 mg by mouth every 6 (six) hours as needed for itching.    donepezil (ARICEPT) 10 MG tablet Take 1 tablet (10 mg total) by mouth at bedtime.   DULoxetine (CYMBALTA) 30 MG capsule TAKE 1 CAPSULE DAILY IN THE EVENING   furosemide (LASIX) 20 MG tablet Take 20 mg by mouth daily as needed.    glucose blood (FREESTYLE LITE) test strip Use as instructed to check blood sugars  3 times per day dx: e11.65   glucose monitoring kit (FREESTYLE) monitoring kit 1 each by Does not apply route as needed for other.   HYDROcodone-acetaminophen (NORCO/VICODIN) 5-325 MG tablet Take 1 tablet by mouth every 6 (six) hours as needed for moderate pain.   insulin glargine, 2 Unit Dial, (TOUJEO MAX SOLOSTAR) 300 UNIT/ML Solostar Pen Inject 13 Units into the skin at bedtime.  Insulin Pen Needle (NOVOFINE PEN NEEDLE) 32G X 6 MM MISC Use with insulin pens   ipratropium (ATROVENT) 0.03 % nasal spray Place 2 sprays into both nostrils every 12 (twelve) hours.   JARDIANCE 10 MG TABS tablet TAKE 1 TABLET DAILY   levocetirizine (XYZAL) 5 MG tablet Take 5 mg by mouth every evening.    lidocaine (LIDODERM) 5 % Place 3 patches onto the skin daily. May use 1-3 patches at one time, Remove & Discard patch within 12 hours or as directed by MD   losartan (COZAAR) 50 MG tablet Take 50 mg by mouth 2 (two) times daily.    Magnesium 250 MG TABS Take 1 tablet by mouth daily.   meloxicam (MOBIC) 15 MG tablet Take 15 mg by mouth daily.   methocarbamol (ROBAXIN) 750 MG tablet Take  750 mg by mouth 3 (three) times daily as needed for muscle spasms.    mirabegron ER (MYRBETRIQ) 50 MG TB24 tablet Take 50 mg by mouth daily.   montelukast (SINGULAIR) 10 MG tablet Take 10 mg by mouth at bedtime.   Multiple Vitamin (MULTIVITAMIN) tablet Take 1 tablet by mouth daily.   nitroGLYCERIN (NITROSTAT) 0.4 MG SL tablet Place 0.4 mg under the tongue every 5 (five) minutes as needed for chest pain.    Omega-3 Fatty Acids (FISH OIL) 1200 MG CAPS Take 1,200 mg by mouth.    pantoprazole (PROTONIX) 40 MG tablet TAKE 1 TABLET DAILY   pramipexole (MIRAPEX) 0.5 MG tablet Take 2 tablets (1 mg total) by mouth in the morning and at bedtime. (Patient taking differently: Take 1 mg by mouth in the morning and at bedtime. One in the morning one in the afternoon and 2 at night.)   rivaroxaban (XARELTO) 10 MG TABS tablet Take 10 mg by mouth daily.   Semaglutide, 1 MG/DOSE, (OZEMPIC, 1 MG/DOSE,) 4 MG/3ML SOPN Inject 1 mg into the skin once a week.   solifenacin (VESICARE) 5 MG tablet Take 1 tablet (5 mg total) by mouth daily.   SYNTHROID 50 MCG tablet TAKE 1 TABLET DAILY   vitamin B-12 (CYANOCOBALAMIN) 1000 MCG tablet Take 1,000 mcg by mouth daily.   WIXELA INHUB 100-50 MCG/ACT AEPB Inhale 1 puff into the lungs 2 (two) times daily.   amoxicillin-clavulanate (AUGMENTIN) 875-125 MG tablet Take 1 tablet by mouth 2 (two) times daily. (Patient not taking: Reported on 01/10/2022)   Facility-Administered Encounter Medications as of 01/10/2022  Medication   cyanocobalamin ((VITAMIN B-12)) injection 1,000 mcg    Allergies (verified) No known allergies   History: Past Medical History:  Diagnosis Date   Atrial fibrillation (HCC)    Diabetes mellitus without complication ([place])    Peripheral vascular disease ([place])    Prostate cancer ([place])    Uvular edema 07/17/2015   Past Surgical History:  Procedure Laterality Date   CORONARY ANGIOPLASTY WITH STENT PLACEMENT     x2   ENDOVENOUS ABLATION SAPHENOUS VEIN W/  LASER Left 08/19/2017   endovenous laser ablation left greater saphenous vein by Tinnie Gens MD    EXTERNAL EAR SURGERY  2020   right ear    UVULECTOMY  07/31/15   Family History  Problem Relation Age of Onset   Lung cancer Mother    Diabetes Mother    Esophageal cancer Father    Hypertension Sister    Breast cancer Sister    Healthy Son    Healthy Daughter    Social History   Socioeconomic History   Marital status: Married  Spouse name: Not on file   Number of children: Not on file   Years of education: Not on file   Highest education level: Not on file  Occupational History   Occupation: retired  Tobacco Use   Smoking status: Former    Packs/day: 2.50    Years: 12.00    Total pack years: 30.00    Types: Cigarettes    Start date: 04/23/1959    Quit date: 08/14/1971    Years since quitting: 50.4   Smokeless tobacco: Never   Tobacco comments:    he has quit  Vaping Use   Vaping Use: Never used  Substance and Sexual Activity   Alcohol use: Not Currently    Alcohol/week: 1.0 standard drink of alcohol    Types: 1 Standard drinks or equivalent per week    Comment: once a month   Drug use: Yes    Types: Hydrocodone   Sexual activity: Not Currently  Other Topics Concern   Not on file  Social History Narrative   Not on file   Social Determinants of Health   Financial Resource Strain: Low Risk  (01/10/2022)   Overall Financial Resource Strain (CARDIA)    Difficulty of Paying Living Expenses: Not hard at all  Food Insecurity: No Food Insecurity (01/10/2022)   Hunger Vital Sign    Worried About Running Out of Food in the Last Year: Never true    Ran Out of Food in the Last Year: Never true  Transportation Needs: No Transportation Needs (01/10/2022)   PRAPARE - Hydrologist (Medical): No    Lack of Transportation (Non-Medical): No  Physical Activity: Inactive (01/10/2022)   Exercise Vital Sign    Days of Exercise per Week: 0 days     Minutes of Exercise per Session: 0 min  Stress: No Stress Concern Present (01/10/2022)   [place]    Feeling of Stress : Not at all  Social Connections: Not on file    Tobacco Counseling Counseling given: Not Answered Tobacco comments: he has quit   Clinical Intake:  Pre-visit preparation completed: Yes  Pain : No/denies pain     Nutritional Status: BMI 25 -29 Overweight Nutritional Risks: None Diabetes: Yes  How often do you need to have someone help you when you read instructions, pamphlets, or other written materials from your doctor or pharmacy?: 1 - Never  Diabetic? Yes Nutrition Risk Assessment:  Has the patient had any N/V/D within the last 2 months?  No  Does the patient have any non-healing wounds?  No  Has the patient had any unintentional weight loss or weight gain?  No   Diabetes:  Is the patient diabetic?  Yes  If diabetic, was a CBG obtained today?  No  Did the patient bring in their glucometer from home?  No  How often do you monitor your CBG's? frequently.   Financial Strains and Diabetes Management:  Are you having any financial strains with the device, your supplies or your medication? No .  Does the patient want to be seen by Chronic Care Management for management of their diabetes?  No  Would the patient like to be referred to a Nutritionist or for Diabetic Management?  No   Diabetic Exams:  Diabetic Eye Exam: Completed 08/21/2021 Diabetic Foot Exam: Overdue, Pt has been advised about the importance in completing this exam. Pt is scheduled for diabetic foot exam on next appointment.  Interpreter Needed?: No  Information entered by :: NAllen LPN   Activities of Daily Living    01/10/2022    2:17 PM  In your present state of health, do you have any difficulty performing the following activities:  Hearing? 1  Comment wears hearing aids  Vision? 1  Comment blurriness at  times  Difficulty concentrating or making decisions? 1  Walking or climbing stairs? 1  Dressing or bathing? 0  Doing errands, shopping? 0  Preparing Food and eating ? N  Using the Toilet? N  In the past six months, have you accidently leaked urine? Y  Do you have problems with loss of bowel control? N  Managing your Medications? N  Managing your Finances? N  Housekeeping or managing your Housekeeping? N    Patient Care Team: [name], MD as PCP - General (Internal Medicine) Gardiner Rhyme, MD as Referring Physician (Specialist) Clifton Custard, MD as Referring Physician (Internal Medicine) Warden Fillers, MD as Consulting Physician (Ophthalmology) Adrian Prows, MD as Consulting Physician (Cardiology) Caud[place], Kennieth Francois, [facility] (Inactive) (Pharmacist)  Indicate any recent Medical Services you may have received from other than [facility] providers in the past year (date may be approximate).     Assessment:   This is a routine wellness examination for [place].  Hearing/Vision screen Vision Screening - Comments:: Regular eye exams, Dr. Katy Fitch  Dietary issues and exercise activities discussed: Current Exercise Habits: The patient does not participate in regular exercise at present   Goals Addressed             This Visit's Progress    Patient Stated       01/10/2022, no goals       Depression Screen    01/10/2022    2:13 PM 12/07/2020    9:21 AM 12/06/2020    2:17 PM 11/24/2019    9:05 AM 12/30/2018    4:29 PM 11/18/2018    9:21 AM 06/17/2018    2:21 PM  PHQ 2/9 Scores  PHQ - 2 Score 2 0 0 0 0 0 0  PHQ- 9 Score 6  0 6  7     Fall Risk    01/10/2022    2:12 PM 04/04/2021   11:24 AM 12/07/2020    9:21 AM 12/06/2020    2:17 PM 11/24/2019    9:05 AM  Fall Risk   Falls in the past year? 1 1 0 0 0  Comment trip over feet, loses balance      Number falls in past yr: 1 1  0   Injury with Fall? 0 1  0   Risk for fall due to : Impaired balance/gait;History of fall(s);Medication  side effect  Medication side effect  Medication side effect  Follow up Falls evaluation completed;Education provided;Falls prevention discussed  Falls evaluation completed;Education provided;Falls prevention discussed  Falls evaluation completed;Education provided;Falls prevention discussed    FALL RISK PREVENTION PERTAINING TO THE HOME:  Any stairs in or around the home? No  If so, are there any without handrails? N/a Home free of loose throw rugs in walkways, pet beds, electrical cords, etc? Yes  Adequate lighting in your home to reduce risk of falls? Yes   ASSISTIVE DEVICES UTILIZED TO PREVENT FALLS:  Life alert? No  Use of a cane, walker or w/c? No  Grab bars in the bathroom? Yes  Shower chair or bench in shower? Yes  Elevated toilet seat or a handicapped toilet? Yes   TIMED UP AND GO:  Was the test performed? Yes .  Length of time to ambulate 10 feet: 5 sec.   Gait steady and fast without use of assistive device  Cognitive Function:        01/10/2022    2:19 PM 12/07/2020    9:23 AM 11/24/2019    9:09 AM 11/18/2018    9:27 AM  6CIT Screen  What Year? 0 points 0 points 0 points 0 points  What month? 0 points 0 points 0 points 0 points  What time? 0 points 0 points 0 points 0 points  Count back from 20 0 points 0 points 0 points 0 points  Months in reverse 0 points 0 points 0 points 0 points  Repeat phrase 2 points 2 points 0 points 0 points  Total Score 2 points 2 points 0 points 0 points    Immunizations Immunization History  Administered Date(s) Administered   Fluad Quad(high Dose 65+) 03/14/2020, 02/08/2021, 01/10/2022   H1N1 07/04/2008   Influenza Split 02/20/2014   Influenza, High Dose Seasonal PF 12/30/2018   Influenza, Seasonal, Injecte, Preservative Fre 04/28/2015   Influenza,inj,Quad PF,6+ Mos 05/04/2018   Moderna SARS-COV2 Booster Vaccination 02/18/2020   Moderna Sars-Covid-2 Vaccination 06/30/2019, 07/21/2019, 07/28/2019, 03/20/2020, 03/27/2021    Pneumococcal Conjugate-13 03/10/2014   Pneumococcal Polysaccharide-23 08/31/2014   Pneumococcal-Unspecified 04/10/2010, 08/31/2014   Tdap 03/10/2014, 11/03/2015   Zoster Recombinat (Shingrix) 05/16/2020, 07/17/2020    TDAP status: Up to date  Flu Vaccine status: Completed at today's visit  Pneumococcal vaccine status: Up to date  Covid-19 vaccine status: Completed vaccines  Qualifies for Shingles Vaccine? Yes   Zostavax completed Yes   Shingrix Completed?: Yes  Screening Tests Health Maintenance  Topic Date Due   FOOT EXAM  12/13/2020   COVID-19 Vaccine (6 - Moderna risk series) 05/22/2021   HEMOGLOBIN A1C  05/18/2022   OPHTHALMOLOGY EXAM  08/22/2022   Diabetic kidney evaluation - GFR measurement  11/16/2022   Diabetic kidney evaluation - Urine ACR  12/08/2022   TETANUS/TDAP  11/02/2025   Pneumonia Vaccine 31+ Years old  Completed   INFLUENZA VACCINE  Completed   Hepatitis C Screening  Completed   Zoster Vaccines- Shingrix  Completed   HPV VACCINES  Aged Out   COLONOSCOPY (Pts 45-48yr Insurance coverage will need to be confirmed)  Discontinued    Health Maintenance  Health Maintenance Due  Topic Date Due   FOOT EXAM  12/13/2020   COVID-19 Vaccine (6 - Moderna risk series) 05/22/2021    Colorectal cancer screening: No longer required.   Lung Cancer Screening: (Low Dose CT Chest recommended if Age 77-80years, 30 pack-year currently smoking OR have quit w/in 15years.) does not qualify.   Lung Cancer Screening Referral: no  Additional Screening:  Hepatitis C Screening: does qualify; Completed 07/22/2012  Vision Screening: Recommended annual ophthalmology exams for early detection of glaucoma and other disorders of the eye. Is the patient up to date with their annual eye exam?  Yes  Who is the provider or what is the name of the office in which the patient attends annual eye exams? Dr. GKaty FitchIf pt is not established with a provider, would they like to be referred  to a provider to establish care? No .   Dental Screening: Recommended annual dental exams for proper oral hygiene  Community Resource Referral / Chronic Care Management: CRR required this visit?  No   CCM required this visit?  No      Plan:     I  have personally reviewed and noted the following in the patient's chart:   Medical and social history Use of alcohol, tobacco or illicit drugs  Current medications and supplements including opioid prescriptions. Patient is currently taking opioid prescriptions. Information provided to patient regarding non-opioid alternatives. Patient advised to discuss non-opioid treatment plan with their provider. Functional ability and status Nutritional status Physical activity Advanced directives List of other physicians Hospitalizations, surgeries, and ER visits in previous 12 months Vitals Screenings to include cognitive, depression, and falls Referrals and appointments  In addition, I have reviewed and discussed with patient certain preventive protocols, quality metrics, and best practice recommendations. A written personalized care plan for preventive services as well as general preventive health recommendations were provided to patient.     Kellie Simmering, LPN   [id number]   Nurse Notes: none

## 2022-01-10 NOTE — Patient Instructions (Signed)
[name] , Thank you for taking time to come for your Medicare Wellness Visit. I appreciate your ongoing commitment to your health goals. Please review the following plan we discussed and let me know if I can assist you in the future.   Screening recommendations/referrals: Colonoscopy: not required Recommended yearly ophthalmology/optometry visit for glaucoma screening and checkup Recommended yearly dental visit for hygiene and checkup  Vaccinations: Influenza vaccine: today Pneumococcal vaccine: completed 08/31/2014 Tdap vaccine: completed 11/03/2015, due 11/02/2025 Shingles vaccine: completed   Covid-19:  03/27/2021, 03/20/2020, 02/18/2020, 07/28/2019, 07/21/2019, 06/30/2019  Advanced directives: Advance directive discussed with you today. Even though you declined this today please call our office should you change your mind and we can give you the proper paperwork for you to fill out.   Conditions/risks identified: none  Next appointment: Follow up in one year for your annual wellness visit.   Preventive Care 101 Years and Older, Male Preventive care refers to lifestyle choices and visits with your health care provider that can promote health and wellness. What does preventive care include? A yearly physical exam. This is also called an annual well check. Dental exams once or twice a year. Routine eye exams. Ask your health care provider how often you should have your eyes checked. Personal lifestyle choices, including: Daily care of your teeth and gums. Regular physical activity. Eating a healthy diet. Avoiding tobacco and drug use. Limiting alcohol use. Practicing safe sex. Taking low doses of aspirin every day. Taking vitamin and mineral supplements as recommended by your health care provider. What happens during an annual well check? The services and screenings done by your health care provider during your annual well check will depend on your age, overall health, lifestyle  risk factors, and family history of disease. Counseling  Your health care provider may ask you questions about your: Alcohol use. Tobacco use. Drug use. Emotional well-being. Home and relationship well-being. Sexual activity. Eating habits. History of falls. Memory and ability to understand (cognition). Work and work Statistician. Screening  You may have the following tests or measurements: Height, weight, and BMI. Blood pressure. Lipid and cholesterol levels. These may be checked every 5 years, or more frequently if you are over 7 years old. Skin check. Lung cancer screening. You may have this screening every year starting at age 83 if you have a 30-pack-year history of smoking and currently smoke or have quit within the past 15 years. Fecal occult blood test (FOBT) of the stool. You may have this test every year starting at age 66. Flexible sigmoidoscopy or colonoscopy. You may have a sigmoidoscopy every 5 years or a colonoscopy every 10 years starting at age 58. Prostate cancer screening. Recommendations will vary depending on your family history and other risks. Hepatitis C blood test. Hepatitis B blood test. Sexually transmitted disease (STD) testing. Diabetes screening. This is done by checking your blood sugar (glucose) after you have not eaten for a while (fasting). You may have this done every 1-3 years. Abdominal aortic aneurysm (AAA) screening. You may need this if you are a current or former smoker. Osteoporosis. You may be screened starting at age 86 if you are at high risk. Talk with your health care provider about your test results, treatment options, and if necessary, the need for more tests. Vaccines  Your health care provider may recommend certain vaccines, such as: Influenza vaccine. This is recommended every year. Tetanus, diphtheria, and acellular pertussis (Tdap, Td) vaccine. You may need a Td booster every 10  years. Zoster vaccine. You may need this after age  15. Pneumococcal 13-valent conjugate (PCV13) vaccine. One dose is recommended after age 63. Pneumococcal polysaccharide (PPSV23) vaccine. One dose is recommended after age 49. Talk to your health care provider about which screenings and vaccines you need and how often you need them. This information is not intended to replace advice given to you by your health care provider. Make sure you discuss any questions you have with your health care provider. Document Released: 05/05/2015 Document Revised: 12/27/2015 Document Reviewed: 02/07/2015 Elsevier Interactive Patient Education  2017 [place] Prevention in the Home Falls can cause injuries. They can happen to people of all ages. There are many things you can do to make your home safe and to help prevent falls. What can I do on the outside of my home? Regularly fix the edges of walkways and driveways and fix any cracks. Remove anything that might make you trip as you walk through a door, such as a raised step or threshold. Trim any bushes or trees on the path to your home. Use bright outdoor lighting. Clear any walking paths of anything that might make someone trip, such as rocks or tools. Regularly check to see if handrails are loose or broken. Make sure that both sides of any steps have handrails. Any raised decks and porches should have guardrails on the edges. Have any leaves, snow, or ice cleared regularly. Use sand or salt on walking paths during winter. Clean up any spills in your garage right away. This includes oil or grease spills. What can I do in the bathroom? Use night lights. Install grab bars by the toilet and in the tub and shower. Do not use towel bars as grab bars. Use non-skid mats or decals in the tub or shower. If you need to sit down in the shower, use a plastic, non-slip stool. Keep the floor dry. Clean up any water that spills on the floor as soon as it happens. Remove soap buildup in the tub or shower  regularly. Attach bath mats securely with double-sided non-slip rug tape. Do not have throw rugs and other things on the floor that can make you trip. What can I do in the bedroom? Use night lights. Make sure that you have a light by your bed that is easy to reach. Do not use any sheets or blankets that are too big for your bed. They should not hang down onto the floor. Have a firm chair that has side arms. You can use this for support while you get dressed. Do not have throw rugs and other things on the floor that can make you trip. What can I do in the kitchen? Clean up any spills right away. Avoid walking on wet floors. Keep items that you use a lot in easy-to-reach places. If you need to reach something above you, use a strong step stool that has a grab bar. Keep electrical cords out of the way. Do not use floor polish or wax that makes floors slippery. If you must use wax, use non-skid floor wax. Do not have throw rugs and other things on the floor that can make you trip. What can I do with my stairs? Do not leave any items on the stairs. Make sure that there are handrails on both sides of the stairs and use them. Fix handrails that are broken or loose. Make sure that handrails are as long as the stairways. Check any carpeting to make sure  that it is firmly attached to the stairs. Fix any carpet that is loose or worn. Avoid having throw rugs at the top or bottom of the stairs. If you do have throw rugs, attach them to the floor with carpet tape. Make sure that you have a light switch at the top of the stairs and the bottom of the stairs. If you do not have them, ask someone to add them for you. What else can I do to help prevent falls? Wear shoes that: Do not have high heels. Have rubber bottoms. Are comfortable and fit you well. Are closed at the toe. Do not wear sandals. If you use a stepladder: Make sure that it is fully opened. Do not climb a closed stepladder. Make sure that  both sides of the stepladder are locked into place. Ask someone to hold it for you, if possible. Clearly mark and make sure that you can see: Any grab bars or handrails. First and last steps. Where the edge of each step is. Use tools that help you move around (mobility aids) if they are needed. These include: Canes. Walkers. Scooters. Crutches. Turn on the lights when you go into a dark area. Replace any light bulbs as soon as they burn out. Set up your furniture so you have a clear path. Avoid moving your furniture around. If any of your floors are uneven, fix them. If there are any pets around you, be aware of where they are. Review your medicines with your doctor. Some medicines can make you feel dizzy. This can increase your chance of falling. Ask your doctor what other things that you can do to help prevent falls. This information is not intended to replace advice given to you by your health care provider. Make sure you discuss any questions you have with your health care provider. Document Released: 02/02/2009 Document Revised: 09/14/2015 Document Reviewed: 05/13/2014 Elsevier Interactive Patient Education  2017 [employer].

## 2022-01-12 DIAGNOSIS — J45909 Unspecified asthma, uncomplicated: Secondary | ICD-10-CM | POA: Diagnosis not present

## 2022-01-12 DIAGNOSIS — I2510 Atherosclerotic heart disease of native coronary artery without angina pectoris: Secondary | ICD-10-CM | POA: Diagnosis not present

## 2022-01-12 DIAGNOSIS — R918 Other nonspecific abnormal finding of lung field: Secondary | ICD-10-CM | POA: Diagnosis not present

## 2022-01-17 DIAGNOSIS — M542 Cervicalgia: Secondary | ICD-10-CM | POA: Diagnosis not present

## 2022-01-17 DIAGNOSIS — G2581 Restless legs syndrome: Secondary | ICD-10-CM | POA: Diagnosis not present

## 2022-01-17 DIAGNOSIS — M5459 Other low back pain: Secondary | ICD-10-CM | POA: Diagnosis not present

## 2022-01-17 DIAGNOSIS — G4719 Other hypersomnia: Secondary | ICD-10-CM | POA: Diagnosis not present

## 2022-01-17 DIAGNOSIS — M5412 Radiculopathy, cervical region: Secondary | ICD-10-CM | POA: Diagnosis not present

## 2022-01-21 ENCOUNTER — Other Ambulatory Visit: Payer: Self-pay | Admitting: Internal Medicine

## 2022-02-12 ENCOUNTER — Telehealth: Payer: Self-pay

## 2022-02-12 ENCOUNTER — Ambulatory Visit (INDEPENDENT_AMBULATORY_CARE_PROVIDER_SITE_OTHER): Payer: Medicare Other | Admitting: Internal Medicine

## 2022-02-12 ENCOUNTER — Encounter: Payer: Self-pay | Admitting: Internal Medicine

## 2022-02-12 VITALS — BP 134/70 | HR 87 | Temp 98.1°F | Ht 71.0 in | Wt 199.4 lb

## 2022-02-12 DIAGNOSIS — N182 Chronic kidney disease, stage 2 (mild): Secondary | ICD-10-CM

## 2022-02-12 DIAGNOSIS — R0989 Other specified symptoms and signs involving the circulatory and respiratory systems: Secondary | ICD-10-CM

## 2022-02-12 DIAGNOSIS — R918 Other nonspecific abnormal finding of lung field: Secondary | ICD-10-CM

## 2022-02-12 DIAGNOSIS — I1310 Hypertensive heart and chronic kidney disease without heart failure, with stage 1 through stage 4 chronic kidney disease, or unspecified chronic kidney disease: Secondary | ICD-10-CM

## 2022-02-12 DIAGNOSIS — E1122 Type 2 diabetes mellitus with diabetic chronic kidney disease: Secondary | ICD-10-CM

## 2022-02-12 DIAGNOSIS — R296 Repeated falls: Secondary | ICD-10-CM

## 2022-02-12 DIAGNOSIS — L03115 Cellulitis of right lower limb: Secondary | ICD-10-CM

## 2022-02-12 DIAGNOSIS — E785 Hyperlipidemia, unspecified: Secondary | ICD-10-CM | POA: Diagnosis not present

## 2022-02-12 MED ORDER — OXYCODONE-ACETAMINOPHEN 5-325 MG PO TABS: 1.0000 | ORAL_TABLET | Freq: Four times a day (QID) | ORAL | 0 refills | Status: DC | PRN

## 2022-02-12 MED ORDER — AMOXICILLIN-POT CLAVULANATE 875-125 MG PO TABS: 1.0000 | ORAL_TABLET | Freq: Two times a day (BID) | ORAL | 0 refills | Status: DC

## 2022-02-12 MED ORDER — CEFTRIAXONE SODIUM 1 G IJ SOLR
1.0000 g | Freq: Once | INTRAMUSCULAR | Status: AC
  Administered 2022-02-12: 1 g via INTRAMUSCULAR

## 2022-02-12 NOTE — Patient Instructions (Signed)

## 2022-02-12 NOTE — Chronic Care Management (AMB) (Addendum)
    Franklin Thornton was reminded to have all medications, supplements and any blood glucose and blood pressure readings available for review with Orlando Penner, Pharm. D, at his telephone visit on 02-13-2022 at 12:00.   Patient couldn't talk due to being at a doctor's appointment   Care Gaps: Foot exam overdue Covid booster overdue  Star Rating Drug: Ozempic 1 mg- Last filled 02-15-2022 [facility] Atorvastatin 20 mg- Last filled 12-13-2021 [facility] Losartan 50 mg- Last filled 01-19-2022 [facility] Jardiance 10 mg- Last filled 12-07-2021 [facility]   Any gaps in medications fill history? No  [place] Pharmacist Assistant [phone]

## 2022-02-12 NOTE — Progress Notes (Signed)
Franklin Thornton,acting as a Education administrator for Franklin Greenland, MD.,have documented all relevant documentation on the behalf of Franklin Greenland, MD,as directed by  Franklin Greenland, MD while in the presence of Franklin Greenland, MD.  Subjective:     Patient ID: Franklin Thornton , male    DOB: 1945-08-22 , 76 y.o.   MRN: [id number]   Chief Complaint  Patient presents with   Diabetes   Hypertension    HPI  Patient presents today for a DM/HTN check.  He reports compliance with meds. He denies headaches, chest pain and shortness of breath.   He states last night he started having bad right leg pain. He states his leg pain has gotten worse since last night. His right leg is swollen, red and he states his foot burns really bad. He rates his leg pain a 12/10. Patient has had several falls, one of his falls have led to a abrasion on his left arm.   BP Readings from Last 3 Encounters: 02/12/22 : 134/70 01/10/22 : 126/68 11/15/21 : 140/90    Diabetes He presents for his follow-up diabetic visit. He has type 2 diabetes mellitus. His disease course has been improving. Pertinent negatives for hypoglycemia include no dizziness or headaches. Pertinent negatives for diabetes include no blurred vision, no chest pain, no fatigue, no polydipsia, no polyphagia and no polyuria. There are no hypoglycemic complications. Risk factors for coronary artery disease include diabetes mellitus, dyslipidemia, hypertension, male sex, obesity and sedentary lifestyle. His breakfast blood glucose is taken between 9-10 am. His breakfast blood glucose range is generally 90-110 mg/dl. An ACE inhibitor/angiotensin II receptor blocker is being taken. Eye exam is current.  Hypertension This is a chronic problem. The current episode started more than 1 year ago. Pertinent negatives include no blurred vision, chest pain, headaches, palpitations or shortness of breath.     Past Medical History:  Diagnosis Date   Atrial fibrillation ([place])     Diabetes mellitus without complication ([place])    Peripheral vascular disease ([place])    Prostate cancer ([place])    Uvular edema 07/17/2015     Family History  Problem Relation Age of Onset   Lung cancer Mother    Diabetes Mother    Esophageal cancer Father    Hypertension Sister    Breast cancer Sister    Healthy Son    Healthy Daughter      Current Outpatient Medications:    albuterol (PROVENTIL HFA;VENTOLIN HFA) 108 (90 Base) MCG/ACT inhaler, Inhale 2 puffs into the lungs every 6 (six) hours as needed for wheezing or shortness of breath., Disp: , Rfl:    albuterol (PROVENTIL) (2.5 MG/3ML) 0.083% nebulizer solution, Take 3 mLs (2.5 mg total) by nebulization every 6 (six) hours as needed for wheezing or shortness of breath., Disp: 75 mL, Rfl: 3   amiodarone (PACERONE) 100 MG tablet, Take 100 mg by mouth daily. , Disp: , Rfl:    amoxicillin-clavulanate (AUGMENTIN) 875-125 MG tablet, Take 1 tablet by mouth 2 (two) times daily., Disp: 20 tablet, Rfl: 0   amphetamine-dextroamphetamine (ADDERALL) 10 MG tablet, Take 1 tablet by mouth daily. Take 2 tablets in the morning and one tablet in the evenings., Disp: , Rfl:    Armodafinil 250 MG tablet, Take 125-250 mg by mouth every morning., Disp: , Rfl:    aspirin 81 MG tablet, Take 81 mg by mouth daily., Disp: , Rfl:    atorvastatin (LIPITOR) 20 MG tablet, TAKE 1 TABLET DAILY,  Disp: 90 tablet, Rfl: 3   azelastine (ASTELIN) 0.1 % nasal spray, Place 1 spray into both nostrils 2 (two) times daily. Use in each nostril as directed, Disp: 30 mL, Rfl: 12   b complex vitamins capsule, Take 1 capsule by mouth daily., Disp: , Rfl:    carvedilol (COREG) 6.25 MG tablet, Take 6.25 mg by mouth 2 (two) times daily with a meal., Disp: , Rfl:    Cholecalciferol (VITAMIN D) 2000 units tablet, Take 2,000 Units by mouth daily., Disp: , Rfl:    diphenhydrAMINE (BENADRYL) 25 MG tablet, Take 25 mg by mouth every 6 (six) hours as needed for itching. , Disp: , Rfl:     donepezil (ARICEPT) 10 MG tablet, TAKE 1 TABLET AT BEDTIME, Disp: 90 tablet, Rfl: 3   DULoxetine (CYMBALTA) 30 MG capsule, TAKE 1 CAPSULE DAILY IN THE EVENING, Disp: 90 capsule, Rfl: 3   furosemide (LASIX) 20 MG tablet, Take 20 mg by mouth daily as needed. , Disp: , Rfl:    glucose blood (FREESTYLE LITE) test strip, Use as instructed to check blood sugars  3 times per day dx: e11.65, Disp: 300 each, Rfl: 1   glucose monitoring kit (FREESTYLE) monitoring kit, 1 each by Does not apply route as needed for other., Disp: , Rfl:    Insulin Pen Needle (NOVOFINE PEN NEEDLE) 32G X 6 MM MISC, Use with insulin pens, Disp: 300 each, Rfl: 3   ipratropium (ATROVENT) 0.03 % nasal spray, Place 2 sprays into both nostrils every 12 (twelve) hours., Disp: , Rfl:    JARDIANCE 10 MG TABS tablet, TAKE 1 TABLET DAILY, Disp: 90 tablet, Rfl: 3   levocetirizine (XYZAL) 5 MG tablet, Take 5 mg by mouth every evening. , Disp: , Rfl:    lidocaine (LIDODERM) 5 %, Place 3 patches onto the skin daily. May use 1-3 patches at one time, Remove & Discard patch within 12 hours or as directed by MD, Disp: 90 patch, Rfl: 0   losartan (COZAAR) 50 MG tablet, Take 50 mg by mouth 2 (two) times daily. , Disp: , Rfl:    Magnesium 250 MG TABS, Take 1 tablet by mouth daily., Disp: , Rfl:    meloxicam (MOBIC) 15 MG tablet, Take 15 mg by mouth daily., Disp: , Rfl:    methocarbamol (ROBAXIN) 750 MG tablet, Take 750 mg by mouth 3 (three) times daily as needed for muscle spasms. , Disp: , Rfl:    mirabegron ER (MYRBETRIQ) 50 MG TB24 tablet, Take 50 mg by mouth daily., Disp: , Rfl:    montelukast (SINGULAIR) 10 MG tablet, Take 10 mg by mouth at bedtime., Disp: , Rfl:    Multiple Vitamin (MULTIVITAMIN) tablet, Take 1 tablet by mouth daily., Disp: , Rfl:    nitroGLYCERIN (NITROSTAT) 0.4 MG SL tablet, Place 0.4 mg under the tongue every 5 (five) minutes as needed for chest pain. , Disp: , Rfl:    Omega-3 Fatty Acids (FISH OIL) 1200 MG CAPS, Take 1,200 mg  by mouth. , Disp: , Rfl:    oxyCODONE-acetaminophen (PERCOCET) 5-325 MG tablet, Take 1 tablet by mouth every 6 (six) hours as needed for severe pain., Disp: 30 tablet, Rfl: 0   pantoprazole (PROTONIX) 40 MG tablet, TAKE 1 TABLET DAILY, Disp: 90 tablet, Rfl: 3   pramipexole (MIRAPEX) 0.5 MG tablet, Take 2 tablets (1 mg total) by mouth in the morning and at bedtime. (Patient taking differently: Take 1 mg by mouth in the morning and at bedtime. One in  the morning one in the afternoon and 2 at night.), Disp: 90 tablet, Rfl: 1   rivaroxaban (XARELTO) 10 MG TABS tablet, Take 10 mg by mouth daily., Disp: , Rfl:    solifenacin (VESICARE) 5 MG tablet, Take 1 tablet (5 mg total) by mouth daily., Disp: 90 tablet, Rfl: 1   SYNTHROID 50 MCG tablet, TAKE 1 TABLET DAILY, Disp: 90 tablet, Rfl: 3   vitamin B-12 (CYANOCOBALAMIN) 1000 MCG tablet, Take 1,000 mcg by mouth daily., Disp: , Rfl:    WIXELA INHUB 100-50 MCG/ACT AEPB, Inhale 1 puff into the lungs 2 (two) times daily., Disp: 60 each, Rfl: 1   amoxicillin-clavulanate (AUGMENTIN) 875-125 MG tablet, Take 1 tablet by mouth 2 (two) times daily. (Patient not taking: Reported on 01/10/2022), Disp: 20 tablet, Rfl: 0   LANTUS SOLOSTAR 100 UNIT/ML Solostar Pen, Inject 26 Units into the skin daily., Disp: , Rfl:    Semaglutide, 1 MG/DOSE, 4 MG/3ML SOPN, Inject 1 mg into the skin once a week., Disp: 9 mL, Rfl: 1  Current Facility-Administered Medications:    cyanocobalamin ((VITAMIN B-12)) injection 1,000 mcg, 1,000 mcg, Intramuscular, Once, [name], MD   Allergies  Allergen Reactions   No Known Allergies      Review of Systems  Constitutional:  Negative for fatigue.  Eyes:  Negative for blurred vision.  Respiratory:  Negative for shortness of breath.   Cardiovascular:  Negative for chest pain and palpitations.  Endocrine: Negative for polydipsia, polyphagia and polyuria.  Neurological:  Negative for dizziness and headaches.     Today's Vitals    02/12/22 1119  BP: 134/70  Pulse: 87  Temp: 98.1 F (36.7 C)  TempSrc: Oral  Weight: 199 lb 6.4 oz (90.4 kg)  Height: $Remove'5\' 11"'pqIgESe$  (1.803 m)  PainSc: 10-Worst pain ever  PainLoc: Leg   Body mass index is 27.81 kg/m.  Wt Readings from Last 3 Encounters:  02/12/22 199 lb 6.4 oz (90.4 kg)  01/10/22 194 lb (88 kg)  11/15/21 205 lb (93 kg)    Objective:  Physical Exam Vitals and nursing note reviewed.  Constitutional:      Appearance: Normal appearance.  HENT:     Head: Normocephalic and atraumatic.     Nose:     Comments: Masked     Mouth/Throat:     Comments: Masked  Eyes:     Extraocular Movements: Extraocular movements intact.  Cardiovascular:     Rate and Rhythm: Normal rate and regular rhythm.     Heart sounds: Normal heart sounds.  Pulmonary:     Effort: Pulmonary effort is normal.     Breath sounds: Normal breath sounds.  Musculoskeletal:     Cervical back: Normal range of motion.     Right lower leg: Edema present.     Comments: Right foot swelling.   Feet:     Comments: Unable to palpate right DP pulse, right foot, LE swelling Skin:    General: Skin is warm.     Comments: Multiple bruises b/l UE, abrasion left UE  Right LLE swelling/erythema, warm to touch Right 4th toe discoloration   Neurological:     General: No focal deficit present.     Mental Status: He is alert.  Psychiatric:        Mood and Affect: Mood normal.      Assessment And Plan:     1. Type 2 diabetes mellitus with stage 2 chronic kidney disease, without long-term current use of insulin (HCC) Comments: Chronic, I  will check labs as below. He will rto in 3 months for re-evaluation.  - Hemoglobin A1c - CMP14+EGFR  2. Hypertensive heart and renal disease with renal failure, stage 1 through stage 4 or unspecified chronic kidney disease, without heart failure Comments: Chronic, fair control. Goal BP<130/80. Elevation today likely related to pain, will not make any med changes today. -  CMP14+EGFR  3. Cellulitis of right lower extremity Comments: He was given Rocephin, 1 gm IM x 1, rx Augmentin $RemoveBefo'875mg'[id number]$  po q12 x 10 days and rx Percocet sent to pharmacy, Franklin reviewed. Will check x-ray as well.  - CBC with Diff - cefTRIAXone (ROCEPHIN) injection 1 g - DG Tibia/Fibula Right; Future  4. Multiple pulmonary nodules Comments: Feb 2023 chest CT results reviewed. Will repeat in 6-12 months as recommended by Radiology.  - CT Chest Wo Contrast; Future  5. Frequent falls Comments: Due to imbalance. He has been evaluated by Neuro, normal pressure hydrocephalus has been ruled out.   6. Decreased dorsalis pedis pulse - Ambulatory referral to Vascular Surgery    Patient was given opportunity to ask questions. Patient verbalized understanding of the plan and was able to repeat key elements of the plan. All questions were answered to their satisfaction.   I, Franklin Greenland, MD, have reviewed all documentation for this visit. The documentation on 02/12/22 for the exam, diagnosis, procedures, and orders are all accurate and complete.   IF YOU HAVE BEEN REFERRED TO A SPECIALIST, IT MAY TAKE 1-2 WEEKS TO SCHEDULE/PROCESS THE REFERRAL. IF YOU HAVE NOT HEARD FROM US/SPECIALIST IN TWO WEEKS, PLEASE GIVE [place] A CALL AT [phone] X [phone].   THE PATIENT IS ENCOURAGED TO PRACTICE SOCIAL DISTANCING DUE TO THE COVID-19 PANDEMIC.

## 2022-02-13 ENCOUNTER — Other Ambulatory Visit (HOSPITAL_COMMUNITY): Payer: Self-pay

## 2022-02-13 ENCOUNTER — Other Ambulatory Visit (HOSPITAL_BASED_OUTPATIENT_CLINIC_OR_DEPARTMENT_OTHER): Payer: Self-pay

## 2022-02-13 ENCOUNTER — Ambulatory Visit (INDEPENDENT_AMBULATORY_CARE_PROVIDER_SITE_OTHER): Payer: Medicare Other

## 2022-02-13 DIAGNOSIS — I700 Atherosclerosis of aorta: Secondary | ICD-10-CM

## 2022-02-13 DIAGNOSIS — E1122 Type 2 diabetes mellitus with diabetic chronic kidney disease: Secondary | ICD-10-CM

## 2022-02-13 LAB — CBC WITH DIFFERENTIAL/PLATELET
Basophils Absolute: 0 10*3/uL (ref 0.0–0.2)
Basos: 0 %
EOS (ABSOLUTE): 0.1 10*3/uL (ref 0.0–0.4)
Eos: 1 %
Hematocrit: 45.4 % (ref 37.5–51.0)
Hemoglobin: 15.7 g/dL (ref 13.0–17.7)
Immature Grans (Abs): 0.1 10*3/uL (ref 0.0–0.1)
Immature Granulocytes: 1 %
Lymphocytes Absolute: 0.9 10*3/uL (ref 0.7–3.1)
Lymphs: 11 %
MCH: 30.8 pg (ref 26.6–33.0)
MCHC: 34.6 g/dL (ref 31.5–35.7)
MCV: 89 fL (ref 79–97)
Monocytes Absolute: 0.5 10*3/uL (ref 0.1–0.9)
Monocytes: 6 %
Neutrophils Absolute: 6.8 10*3/uL (ref 1.4–7.0)
Neutrophils: 81 %
Platelets: 208 10*3/uL (ref 150–450)
RBC: 5.09 x10E6/uL (ref 4.14–5.80)
RDW: 12.8 % (ref 11.6–15.4)
WBC: 8.4 10*3/uL (ref 3.4–10.8)

## 2022-02-13 LAB — CMP14+EGFR
ALT: 28 IU/L (ref 0–44)
AST: 19 IU/L (ref 0–40)
Albumin/Globulin Ratio: 1.6 (ref 1.2–2.2)
Albumin: 4 g/dL (ref 3.8–4.8)
Alkaline Phosphatase: 77 IU/L (ref 44–121)
BUN/Creatinine Ratio: 23 (ref 10–24)
BUN: 18 mg/dL (ref 8–27)
Bilirubin Total: 0.9 mg/dL (ref 0.0–1.2)
CO2: 26 mmol/L (ref 20–29)
Calcium: 9.6 mg/dL (ref 8.6–10.2)
Chloride: 96 mmol/L (ref 96–106)
Creatinine, Ser: 0.78 mg/dL (ref 0.76–1.27)
Globulin, Total: 2.5 g/dL (ref 1.5–4.5)
Glucose: 123 mg/dL — ABNORMAL HIGH (ref 70–99)
Potassium: 4.8 mmol/L (ref 3.5–5.2)
Sodium: 139 mmol/L (ref 134–144)
Total Protein: 6.5 g/dL (ref 6.0–8.5)
eGFR: 92 mL/min/{1.73_m2} (ref >59)

## 2022-02-13 LAB — HEMOGLOBIN A1C
Est. average glucose Bld gHb Est-mCnc: 166 mg/dL
Hgb A1c MFr Bld: 7.4 % — ABNORMAL HIGH (ref 4.8–5.6)

## 2022-02-13 MED ORDER — SEMAGLUTIDE (1 MG/DOSE) 4 MG/3ML ~~LOC~~ SOPN
1.0000 mg | PEN_INJECTOR | SUBCUTANEOUS | 1 refills | Status: AC
  Filled 2022-02-13: qty 9, 84d supply, fill #0
  Filled 2022-02-13: qty 3, 28d supply, fill #0

## 2022-02-13 NOTE — Progress Notes (Signed)
Chronic Care Management Pharmacy Note  02/19/2022 Name:  Franklin Thornton MRN:  [id number] DOB:  1945-05-18  Summary: Patient reports that he has been doing okay. He is checking his BS and taking his medications daily.   Recommendations/Changes made from today's visit: Recommend he continue current medication regimen  Sent in a refill request for patients current medication regimen.   Plan: Follow up with patient about his ability to afford medication and questions he might have.  Collaborated with PCP and sent in prescription for Ozempic to pharmacy    Subjective: Franklin Thornton is an 76 y.o. year old male who is a primary patient of [name], MD.  The CCM team was consulted for assistance with disease management and care coordination needs.    Engaged with patient by telephone for follow up visit in response to provider referral for pharmacy case management and/or care coordination services.   Consent to Services:  The patient was given information about Chronic Care Management services, agreed to services, and gave verbal consent prior to initiation of services.  Please see initial visit note for detailed documentation.   Patient Care Team: [name], MD as PCP - General (Internal Medicine) Gardiner Rhyme, MD as Referring Physician (Specialist) Clifton Custard, MD as Referring Physician (Internal Medicine) Warden Fillers, MD as Consulting Physician (Ophthalmology) Adrian Prows, MD as Consulting Physician (Cardiology)  Recent office visits: 02/12/2022 PCP OV  Recent consult visits: 12/18/2021 Urology OV 12/07/2021 Endocrinology [facility] visits: None in previous 6 months   Objective:  Lab Results  Component Value Date   CREATININE 0.78 02/12/2022   BUN 18 02/12/2022   EGFR 92 02/12/2022   GFRNONAA 68 03/14/2020   GFRAA 79 03/14/2020   NA 139 02/12/2022   K 4.8 02/12/2022   CALCIUM 9.6 02/12/2022   CO2 26 02/12/2022   GLUCOSE 123 (H) 02/12/2022     Lab Results  Component Value Date/Time   HGBA1C 7.4 (H) 02/12/2022 12:21 PM   HGBA1C 6.9 (H) 11/15/2021 11:49 AM   MICROALBUR 30 07/30/2018 12:32 PM    Last diabetic Eye exam:  Lab Results  Component Value Date/Time   HMDIABEYEEXA No Retinopathy 08/21/2021 12:00 AM    Last diabetic Foot exam: No results found for: "HMDIABFOOTEX"   Lab Results  Component Value Date   CHOL 220 (H) 02/12/2022   HDL 87 02/12/2022   LDLCALC 110 (H) 02/12/2022   TRIG 133 02/12/2022   CHOLHDL 3.9 07/31/2020       Latest Ref Rng & Units 02/12/2022   12:21 PM 05/23/2021    4:14 PM 03/08/2021    4:14 PM  Hepatic Function  Total Protein 6.0 - 8.5 g/dL 6.5  6.2  6.3   Albumin 3.8 - 4.8 g/dL 4.0  3.7  3.7   AST 0 - 40 IU/L $Remov'19  20  19   'TFWjhv$ ALT 0 - 44 IU/L 28  29  34   Alk Phosphatase 44 - 121 IU/L 77  81  78   Total Bilirubin 0.0 - 1.2 mg/dL 0.9  0.4  0.5     Lab Results  Component Value Date/Time   TSH 3.330 11/15/2021 11:49 AM   TSH 1.700 04/04/2021 12:01 PM   FREET4 1.62 11/15/2021 11:49 AM   FREET4 1.72 04/04/2021 12:01 PM       Latest Ref Rng & Units 02/12/2022   12:21 PM 05/15/2021   12:00 AM 08/22/2020    4:14 PM  CBC  WBC 3.4 -  10.8 x10E3/uL 8.4   7.3   Hemoglobin 13.0 - 17.7 g/dL 15.7  7.9     16.3   Hematocrit 37.5 - 51.0 % 45.4   47.7   Platelets 150 - 450 x10E3/uL 208   239      This result is from an external source.    No results found for: "VD25OH"  Clinical ASCVD: Yes  The 10-year ASCVD risk score (Arnett DK, et al., 2019) is: 50.1%   Values used to calculate the score:     Age: 18 years     Sex: Male     Is Non-Hispanic African American: No     Diabetic: Yes     Tobacco smoker: No     Systolic Blood Pressure: 211 mmHg     Is BP treated: Yes     HDL Cholesterol: 87 mg/dL     Total Cholesterol: 220 mg/dL       02/12/2022   11:16 AM 01/10/2022    2:13 PM 12/07/2020    9:21 AM  Depression screen PHQ 2/9  Decreased Interest 0 1 0  Down, Depressed, Hopeless 1 1  0  PHQ - 2 Score 1 2 0  Altered sleeping 3 0   Tired, decreased energy 0 3   Change in appetite 3 0   Feeling bad or failure about yourself  0 0   Trouble concentrating 1 1   Moving slowly or fidgety/restless 0 0   Suicidal thoughts 0 0   PHQ-9 Score 8 6   Difficult doing work/chores Somewhat difficult Not difficult at all      Social History   Tobacco Use  Smoking Status Former   Packs/day: 2.50   Years: 12.00   Total pack years: 30.00   Types: Cigarettes   Start date: 04/23/1959   Quit date: 08/14/1971   Years since quitting: 50.5  Smokeless Tobacco Never  Tobacco Comments   he has quit   BP Readings from Last 3 Encounters:  02/12/22 134/70  01/10/22 126/68  11/15/21 140/90   Pulse Readings from Last 3 Encounters:  02/12/22 87  01/10/22 83  11/15/21 64   Wt Readings from Last 3 Encounters:  02/12/22 199 lb 6.4 oz (90.4 kg)  01/10/22 194 lb (88 kg)  11/15/21 205 lb (93 kg)   BMI Readings from Last 3 Encounters:  02/12/22 27.81 kg/m  01/10/22 27.06 kg/m  11/15/21 33.09 kg/m    Assessment/Interventions: Review of patient past medical history, allergies, medications, health status, including review of consultants reports, laboratory and other test data, was performed as part of comprehensive evaluation and provision of chronic care management services.   SDOH:  (Social Determinants of Health) assessments and interventions performed: No SDOH Interventions    Flowsheet Row Clinical Support from 01/10/2022 in [facility] Most recent reading at 01/10/2022  2:10 PM Chronic Care Management from 11/18/2019 in [facility] Most recent reading at 12/20/2019  9:08 PM Clinical Support from 11/24/2019 in [facility] Most recent reading at 11/24/2019  9:05 AM  SDOH Interventions     Food Insecurity Interventions Intervention Not Indicated -- --  Transportation Interventions Intervention Not Indicated -- --   Depression Interventions/Treatment  -- -- Currently on Treatment  Financial Strain Interventions Intervention Not Indicated Intervention Not Indicated --  Physical Activity Interventions Patient Refused, Other (Comments) -- --  Stress Interventions Intervention Not Indicated -- --      SDOH Screenings   Food Insecurity: No  Food Insecurity (01/10/2022)  Transportation Needs: No Transportation Needs (01/10/2022)  Depression (PHQ2-9): Medium Risk (02/12/2022)  Financial Resource Strain: Low Risk  (01/10/2022)  Physical Activity: Inactive (01/10/2022)  Stress: No Stress Concern Present (01/10/2022)  Tobacco Use: Medium Risk (02/12/2022)    CCM Care Plan  Allergies  Allergen Reactions   No Known Allergies     Medications Reviewed Today     Reviewed by [name], MD (Physician) on 02/12/22 at 1128  Med List Status: <None>   Medication Order Taking? Sig Documenting Provider Last Dose Status Informant  albuterol (PROVENTIL HFA;VENTOLIN HFA) 108 (90 Base) MCG/ACT inhaler [id number] Yes Inhale 2 puffs into the lungs every 6 (six) hours as needed for wheezing or shortness of breath. [provider] Taking Active   albuterol (PROVENTIL) (2.5 MG/3ML) 0.083% nebulizer solution [id number] Yes Take 3 mLs (2.5 mg total) by nebulization every 6 (six) hours as needed for wheezing or shortness of breath. Arnette Felts, FNP Taking Active   amiodarone (PACERONE) 100 MG tablet [id number] Yes Take 100 mg by mouth daily.  [provider] Taking Active   amoxicillin-clavulanate (AUGMENTIN) 875-125 MG tablet [id number] No Take 1 tablet by mouth 2 (two) times daily.  Patient not taking: Reported on 01/10/2022   [name], MD Not Taking Active   amphetamine-dextroamphetamine (ADDERALL) 10 MG tablet [id number] Yes Take 1 tablet by mouth daily. Take 2 tablets in the morning and one tablet in the evenings. [provider] Taking Active   Armodafinil 250 MG tablet [id number] Yes Take  125-250 mg by mouth every morning. [provider] Taking Active   aspirin 81 MG tablet [id number] Yes Take 81 mg by mouth daily. [provider] Taking Active   atorvastatin (LIPITOR) 20 MG tablet [id number] Yes TAKE 1 TABLET DAILY [name], MD Taking Active   azelastine (ASTELIN) 0.1 % nasal spray [id number] Yes Place 1 spray into both nostrils 2 (two) times daily. Use in each nostril as directed [name], MD Taking Active   b complex vitamins capsule [id number] Yes Take 1 capsule by mouth daily. [provider] Taking Active   carvedilol (COREG) 6.25 MG tablet [id number] Yes Take 6.25 mg by mouth 2 (two) times daily with a meal. [provider] Taking Active   Cholecalciferol (VITAMIN D) 2000 units tablet [id number] Yes Take 2,000 Units by mouth daily. [provider] Taking Active   cyanocobalamin ((VITAMIN B-12)) injection 1,000 mcg [id number]   [name], MD  Active   diphenhydrAMINE (BENADRYL) 25 MG tablet [id number] Yes Take 25 mg by mouth every 6 (six) hours as needed for itching.  [provider] Taking Active   donepezil (ARICEPT) 10 MG tablet [id number] Yes TAKE 1 TABLET AT BEDTIME [name], MD Taking Active   DULoxetine (CYMBALTA) 30 MG capsule [id number] Yes TAKE 1 CAPSULE DAILY IN THE Josephine Igo, MD Taking Active   furosemide (LASIX) 20 MG tablet [id number] Yes Take 20 mg by mouth daily as needed.  [provider] Taking Active   glucose blood (FREESTYLE LITE) test strip [id number] Yes Use as instructed to check blood sugars  3 times per day dx: e11.65 [name], MD Taking Active   glucose monitoring kit (FREESTYLE) monitoring kit [id number] Yes 1 each by Does not apply route as needed for other. [provider] Taking Active   HYDROcodone-acetaminophen (NORCO/VICODIN) 5-325 MG tablet [id number] Yes Take 1 tablet by mouth every 6 (six) hours as needed for moderate pain. [provider]  Taking Active  insulin glargine, 2 Unit Dial, (TOUJEO MAX SOLOSTAR) 300 UNIT/ML Solostar Pen [id number] Yes Inject 13 Units into the skin at bedtime. [provider] Taking Active   Insulin Pen Needle (NOVOFINE PEN NEEDLE) 32G X 6 MM MISC [id number] Yes Use with insulin pens [name], MD Taking Active   ipratropium (ATROVENT) 0.03 % nasal spray [id number] Yes Place 2 sprays into both nostrils every 12 (twelve) hours. [provider] Taking Active   JARDIANCE 10 MG TABS tablet [id number] Yes TAKE 1 TABLET DAILY [name], MD Taking Active   levocetirizine (XYZAL) 5 MG tablet [id number] Yes Take 5 mg by mouth every evening.  [provider] Taking Active   lidocaine (LIDODERM) 5 % [id number] Yes Place 3 patches onto the skin daily. May use 1-3 patches at one time, Remove & Discard patch within 12 hours or as directed by MD [name], MD Taking Active   losartan (COZAAR) 50 MG tablet [id number] Yes Take 50 mg by mouth 2 (two) times daily.  [provider] Taking Active   Magnesium 250 MG TABS [id number] Yes Take 1 tablet by mouth daily. [provider] Taking Active   meloxicam (MOBIC) 15 MG tablet [id number] Yes Take 15 mg by mouth daily. [provider] Taking Active   methocarbamol (ROBAXIN) 750 MG tablet [id number] Yes Take 750 mg by mouth 3 (three) times daily as needed for muscle spasms.  [provider] Taking Active   mirabegron ER (MYRBETRIQ) 50 MG TB24 tablet [id number] Yes Take 50 mg by mouth daily. [provider] Taking Active   montelukast (SINGULAIR) 10 MG tablet [id number] Yes Take 10 mg by mouth at bedtime. [provider] Taking Active   Multiple Vitamin (MULTIVITAMIN) tablet [id number] Yes Take 1 tablet by mouth daily. [provider] Taking Active   nitroGLYCERIN (NITROSTAT) 0.4 MG SL tablet [id number] Yes Place 0.4 mg under the tongue every 5 (five) minutes as needed for chest pain.  [provider] Taking Active   Omega-3 Fatty Acids (FISH OIL) 1200 MG CAPS [id number] Yes Take 1,200 mg by mouth.  [provider] Taking Active   pantoprazole (PROTONIX) 40 MG tablet [id number] Yes TAKE 1 TABLET DAILY [name], MD Taking Active   pramipexole (MIRAPEX) 0.5 MG tablet [id number] Yes Take 2 tablets (1 mg total) by mouth in the morning and at bedtime.  Patient taking differently: Take 1 mg by mouth in the morning and at bedtime. One in the morning one in the afternoon and 2 at night.   [name], MD Taking Active   rivaroxaban (XARELTO) 10 MG TABS tablet [id number] Yes Take 10 mg by mouth daily. [provider] Taking Active   Semaglutide, 1 MG/DOSE, (OZEMPIC, 1 MG/DOSE,) 4 MG/3ML SOPN [id number] Yes Inject 1 mg into the skin once a week. Arnette Felts, FNP Taking Active   solifenacin (VESICARE) 5 MG tablet [id number] Yes Take 1 tablet (5 mg total) by mouth daily. [name], MD Taking Active   SYNTHROID 50 MCG tablet [id number] Yes TAKE 1 TABLET DAILY [name], MD Taking Active   vitamin B-12 (CYANOCOBALAMIN) 1000 MCG tablet [id number] Yes Take 1,000 mcg by mouth daily. [provider] Taking Active            Med Note Trina Ao Mar 20, 2021  2:43 PM)    Monte Fantasia INHUB 100-50 MCG/ACT AEPB [id number] Yes Inhale 1 puff into the lungs 2 (two) times daily. Arnette Felts, FNP Taking Active  Med List Note [facility], Bailey Mech, MD 12/06/20 1448): NOrco prescribed by the [facility]            Patient Active Problem List   Diagnosis Date Noted   Multiple pulmonary nodules 02/12/2022   Frequent falls 02/12/2022   Decreased dorsalis pedis pulse 02/12/2022   Mild cognitive impairment 12/08/2020   Cellulitis of right lower extremity 03/26/2020   Atherosclerosis of aorta (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   Atherosclerosis of native coronary artery of native heart without angina pectoris 06/17/2018   Arrhythmia 11/18/2017   Hyperlipidemia 11/18/2017   Hypertension 11/18/2017   Varicose veins of left lower extremity with complications 66/29/4765   Pain in both lower extremities 08/14/2015   Globus pharyngeus 07/17/2015   Abdominal pain 12/12/2013   History of carcinoma in situ of prostate 12/12/2013   Male erectile dysfunction 12/12/2013   Personal history of prostate cancer 12/12/2013   OAB (overactive bladder) 11/03/2013   Nocturia 11/03/2013   Stress incontinence 11/03/2013   Urinary incontinence 11/03/2013   Headache associated with sexual activity 12/14/2012   Memory loss 12/14/2012   Myalgia and myositis 12/14/2012   Obstructive sleep apnea 12/14/2012   Insomnia 12/14/2012   Partial epilepsy with impairment of consciousness, with intractable epilepsy ([place]) 12/14/2012   Restless legs syndrome 12/14/2012   Tension type headache 12/14/2012   Dehydration, severe 46/50/3546   Metabolic acidosis 56/81/2751   Noninfective gastroenteritis and colitis 07/11/2012    Immunization History  Administered Date(s) Administered   Fluad Quad(high Dose 65+) 03/14/2020, 02/08/2021, 01/10/2022   H1N1 07/04/2008   Influenza Split 02/20/2014   Influenza, High Dose Seasonal PF 12/30/2018   Influenza, Seasonal, Injecte, Preservative Fre 04/28/2015   Influenza,inj,Quad PF,6+ Mos 05/04/2018   Moderna SARS-COV2 Booster Vaccination 02/18/2020   Moderna Sars-Covid-2 Vaccination 06/30/2019, 07/21/2019, 07/28/2019, 03/20/2020, 03/27/2021   Pneumococcal Conjugate-13 03/10/2014   Pneumococcal Polysaccharide-23 08/31/2014   Pneumococcal-Unspecified 04/10/2010, 08/31/2014   Tdap 03/10/2014, 11/03/2015   Zoster Recombinat (Shingrix) 05/16/2020, 07/17/2020    Conditions to be addressed/monitored:  Hyperlipidemia and Diabetes  Care Plan :  [place]  Updates made by Mayford Knife, [place] since 02/19/2022 12:00 AM     Problem: HLD, DM II      Long-Range Goal: Disease Management   Start Date: 08/24/2020  Recent Progress: On track  Priority: High  Note:   Current Barriers:  Unable to independently monitor therapeutic efficacy  Pharmacist Clinical Goal(s):  Patient will achieve adherence to monitoring guidelines and medication adherence to achieve therapeutic efficacy through collaboration with PharmD and provider.   Interventions: 1:1 collaboration with [name], MD regarding development and update of comprehensive plan of care as evidenced by provider attestation and co-signature Inter-disciplinary care team collaboration (see longitudinal plan of care) Comprehensive medication review performed; medication list updated in electronic medical record  Hyperlipidemia: (LDL goal < 55) -Not ideally controlled -Current treatment: Atorvastatin 20 mg tablet once per day Appropriate, Effective, Safe, Accessible -Current dietary patterns: he is eating more vegetables  -Educated on Cholesterol goals;  Importance of limiting foods high in cholesterol; -Recommend Mr. Suleiman have a lipid panel completed during his next visit.  -Recommended to continue current medication  Diabetes (A1c goal <8%) -Controlled -Current medications: Lantus Solostar - inject 26 units into the skin daily Appropriate, Effective, Safe, Accessible  Humalog injecting on a sliding scale Appropriate, Effective, Safe, Accessible Ozempic- taking 1 mg once per week Appropriate, Effective, Safe, Query accessible Patient has been out of medication for one month  Jardiance 10 mg -taking 1 tablet by mouth daily Appropriate, Effective, Safe, Accessible -Current home glucose readings: 99-150 -Denies hypoglycemic/hyperglycemic symptoms -He has lost a lot of weight being on Ozempic from 247-193, he reports that he had not seen under 200 in over  thirty years.  -Current meal patterns:  he has changed his eating habits tremendously  -Educated on Complications of diabetes including kidney damage, retinal damage, and cardiovascular disease; Exercise goal of 150 minutes per week;  -Counseled to check feet daily and get yearly eye exams -Recommended to continue current medication  Patient Goals/Self-Care Activities Patient will:  - take medications as prescribed as evidenced by patient report and record review  Follow Up Plan: The patient has been provided with contact information for the care management team and has been advised to call with any health related questions or concerns.       Medication Assistance: None required.  Patient affirms current coverage meets needs.  Compliance/Adherence/Medication fill history: Care Gaps: COVID-19 Vaccine  Star-Rating Drugs: Ozempic 1 mg once per week  Atorvastatin 20 mg tablet  Jardiance 10 mg tablet  Losartan 50 mg tablet   Patient's preferred pharmacy is:  [place], [place] [address] [place] [address] Phone: [phone] Fax: [place] [address], [place] AT [facility] OF [place] [address] Phone: [phone] Fax: [phone]  [facility] [address] [place], [place] [address] Phone: [phone] Fax: [phone]  [place] [address] Phone: [phone] Fax: [phone]  Uses pill box? Yes Pt endorses 90% compliance  We discussed: Benefits of medication synchronization, packaging and delivery as well as enhanced pharmacist oversight with [facility]. Patient decided to: Continue current medication management strategy  Care Plan and Follow Up  Patient Decision:  Patient agrees to Care Plan and Follow-up.  Plan: The patient has been provided with contact information for the care management team and has been advised to call with any health related questions or concerns.   Orlando Penner, CPP, PharmD Clinical Pharmacist Practitioner [facility] [phone]

## 2022-02-14 ENCOUNTER — Other Ambulatory Visit (HOSPITAL_COMMUNITY): Payer: Self-pay

## 2022-02-16 LAB — SPECIMEN STATUS REPORT

## 2022-02-16 LAB — LIPID PANEL W/O CHOL/HDL RATIO
Cholesterol, Total: 220 mg/dL — ABNORMAL HIGH (ref 100–199)
HDL: 87 mg/dL (ref >39)
LDL Chol Calc (NIH): 110 mg/dL — ABNORMAL HIGH (ref 0–99)
Triglycerides: 133 mg/dL (ref 0–149)
VLDL Cholesterol Cal: 23 mg/dL (ref 5–40)

## 2022-02-19 DIAGNOSIS — E785 Hyperlipidemia, unspecified: Secondary | ICD-10-CM

## 2022-02-19 DIAGNOSIS — E1169 Type 2 diabetes mellitus with other specified complication: Secondary | ICD-10-CM | POA: Diagnosis not present

## 2022-02-19 DIAGNOSIS — Z794 Long term (current) use of insulin: Secondary | ICD-10-CM | POA: Diagnosis not present

## 2022-02-19 DIAGNOSIS — Z7984 Long term (current) use of oral hypoglycemic drugs: Secondary | ICD-10-CM | POA: Diagnosis not present

## 2022-02-19 DIAGNOSIS — Z7985 Long-term (current) use of injectable non-insulin antidiabetic drugs: Secondary | ICD-10-CM

## 2022-02-19 NOTE — Patient Instructions (Signed)
Visit Information It was great speaking with you today!  Please let me know if you have any questions about our visit.   Goals Addressed             This Visit's Progress    Manage My Medicine       Timeframe:  Long-Range Goal Priority:  High Start Date:                             Expected End Date:                       Follow Up Date 05/21/2021  In Progress:    - call for medicine refill 2 or 3 days before it runs out - call if I am sick and can't take my medicine - keep a list of all the medicines I take; vitamins and herbals too - use a pillbox to sort medicine - use an alarm clock or phone to remind me to take my medicine    Why is this important?   These steps will help you keep on track with your medicines.         Patient Care Plan: CCM Pharmacy Care Plan     Problem Identified: HLD, DM II      Long-Range Goal: Disease Management   Start Date: 08/24/2020  Recent Progress: On track  Priority: High  Note:   Current Barriers:  Unable to independently monitor therapeutic efficacy  Pharmacist Clinical Goal(s):  Patient will achieve adherence to monitoring guidelines and medication adherence to achieve therapeutic efficacy through collaboration with PharmD and provider.   Interventions: 1:1 collaboration with [name], MD regarding development and update of comprehensive plan of care as evidenced by provider attestation and co-signature Inter-disciplinary care team collaboration (see longitudinal plan of care) Comprehensive medication review performed; medication list updated in electronic medical record  Hyperlipidemia: (LDL goal < 55) -Not ideally controlled -Current treatment: Atorvastatin 20 mg tablet once per day Appropriate, Effective, Safe, Accessible -Current dietary patterns: he is eating more vegetables  -Educated on Cholesterol goals;  Importance of limiting foods high in cholesterol; -Recommend Mr. Balis have a lipid panel completed  during his next visit.  -Recommended to continue current medication  Diabetes (A1c goal <8%) -Controlled -Current medications: Lantus Solostar - inject 26 units into the skin daily Appropriate, Effective, Safe, Accessible Humalog injecting on a sliding scale Appropriate, Effective, Safe, Accessible Ozempic- taking 1 mg once per week Appropriate, Effective, Safe, Query accessible Patient has been out of medication for one month  Jardiance 10 mg -taking 1 tablet by mouth daily Appropriate, Effective, Safe, Accessible -Current home glucose readings: 99-150 -Denies hypoglycemic/hyperglycemic symptoms -He has lost a lot of weight being on Ozempic from 247-193, he reports that he had not seen under 200 in over thirty years.  -Current meal patterns:  he has changed his eating habits tremendously  -Educated on Complications of diabetes including kidney damage, retinal damage, and cardiovascular disease; Exercise goal of 150 minutes per week;  -Counseled to check feet daily and get yearly eye exams -Recommended to continue current medication  Patient Goals/Self-Care Activities Patient will:  - take medications as prescribed as evidenced by patient report and record review  Follow Up Plan: The patient has been provided with contact information for the care management team and has been advised to call with any health related questions or concerns.  Patient agreed to services and verbal consent obtained.   The patient verbalized understanding of instructions, educational materials, and care plan provided today and agreed to receive a mailed copy of patient instructions, educational materials, and care plan.   Orlando Penner, PharmD Clinical Pharmacist [facility] [phone]

## 2022-02-20 DIAGNOSIS — L0390 Cellulitis, unspecified: Secondary | ICD-10-CM | POA: Diagnosis not present

## 2022-02-20 DIAGNOSIS — M7731 Calcaneal spur, right foot: Secondary | ICD-10-CM | POA: Diagnosis not present

## 2022-02-22 DIAGNOSIS — E1165 Type 2 diabetes mellitus with hyperglycemia: Secondary | ICD-10-CM | POA: Diagnosis not present

## 2022-02-22 DIAGNOSIS — E1169 Type 2 diabetes mellitus with other specified complication: Secondary | ICD-10-CM | POA: Diagnosis not present

## 2022-02-22 DIAGNOSIS — I152 Hypertension secondary to endocrine disorders: Secondary | ICD-10-CM | POA: Diagnosis not present

## 2022-02-22 DIAGNOSIS — Z794 Long term (current) use of insulin: Secondary | ICD-10-CM | POA: Diagnosis not present

## 2022-02-22 DIAGNOSIS — I2510 Atherosclerotic heart disease of native coronary artery without angina pectoris: Secondary | ICD-10-CM | POA: Diagnosis not present

## 2022-02-22 DIAGNOSIS — E1159 Type 2 diabetes mellitus with other circulatory complications: Secondary | ICD-10-CM | POA: Diagnosis not present

## 2022-02-22 DIAGNOSIS — E1142 Type 2 diabetes mellitus with diabetic polyneuropathy: Secondary | ICD-10-CM | POA: Diagnosis not present

## 2022-02-22 DIAGNOSIS — E1140 Type 2 diabetes mellitus with diabetic neuropathy, unspecified: Secondary | ICD-10-CM | POA: Diagnosis not present

## 2022-02-22 DIAGNOSIS — Z5181 Encounter for therapeutic drug level monitoring: Secondary | ICD-10-CM | POA: Diagnosis not present

## 2022-02-22 DIAGNOSIS — E785 Hyperlipidemia, unspecified: Secondary | ICD-10-CM | POA: Diagnosis not present

## 2022-02-28 DIAGNOSIS — J301 Allergic rhinitis due to pollen: Secondary | ICD-10-CM | POA: Diagnosis not present

## 2022-02-28 DIAGNOSIS — G4736 Sleep related hypoventilation in conditions classified elsewhere: Secondary | ICD-10-CM | POA: Diagnosis not present

## 2022-02-28 DIAGNOSIS — G2581 Restless legs syndrome: Secondary | ICD-10-CM | POA: Diagnosis not present

## 2022-02-28 DIAGNOSIS — J4520 Mild intermittent asthma, uncomplicated: Secondary | ICD-10-CM | POA: Diagnosis not present

## 2022-02-28 DIAGNOSIS — R918 Other nonspecific abnormal finding of lung field: Secondary | ICD-10-CM | POA: Diagnosis not present

## 2022-03-04 DIAGNOSIS — M79604 Pain in right leg: Secondary | ICD-10-CM | POA: Diagnosis not present

## 2022-03-04 DIAGNOSIS — M79605 Pain in left leg: Secondary | ICD-10-CM | POA: Diagnosis not present

## 2022-03-05 ENCOUNTER — Telehealth: Payer: Self-pay

## 2022-03-05 ENCOUNTER — Other Ambulatory Visit: Payer: Self-pay

## 2022-03-05 DIAGNOSIS — L089 Local infection of the skin and subcutaneous tissue, unspecified: Secondary | ICD-10-CM | POA: Diagnosis not present

## 2022-03-05 DIAGNOSIS — E11622 Type 2 diabetes mellitus with other skin ulcer: Secondary | ICD-10-CM | POA: Diagnosis not present

## 2022-03-05 DIAGNOSIS — I10 Essential (primary) hypertension: Secondary | ICD-10-CM | POA: Diagnosis not present

## 2022-03-05 DIAGNOSIS — Z79899 Other long term (current) drug therapy: Secondary | ICD-10-CM | POA: Diagnosis not present

## 2022-03-05 DIAGNOSIS — E785 Hyperlipidemia, unspecified: Secondary | ICD-10-CM | POA: Diagnosis not present

## 2022-03-05 DIAGNOSIS — Z79890 Hormone replacement therapy (postmenopausal): Secondary | ICD-10-CM | POA: Diagnosis not present

## 2022-03-05 DIAGNOSIS — S81801A Unspecified open wound, right lower leg, initial encounter: Secondary | ICD-10-CM | POA: Diagnosis not present

## 2022-03-05 DIAGNOSIS — I4891 Unspecified atrial fibrillation: Secondary | ICD-10-CM | POA: Diagnosis not present

## 2022-03-05 DIAGNOSIS — L03115 Cellulitis of right lower limb: Secondary | ICD-10-CM | POA: Diagnosis not present

## 2022-03-05 DIAGNOSIS — L03116 Cellulitis of left lower limb: Secondary | ICD-10-CM | POA: Diagnosis not present

## 2022-03-05 DIAGNOSIS — Z7982 Long term (current) use of aspirin: Secondary | ICD-10-CM | POA: Diagnosis not present

## 2022-03-05 DIAGNOSIS — L97919 Non-pressure chronic ulcer of unspecified part of right lower leg with unspecified severity: Secondary | ICD-10-CM | POA: Diagnosis not present

## 2022-03-05 DIAGNOSIS — L97929 Non-pressure chronic ulcer of unspecified part of left lower leg with unspecified severity: Secondary | ICD-10-CM | POA: Diagnosis not present

## 2022-03-05 DIAGNOSIS — Z8546 Personal history of malignant neoplasm of prostate: Secondary | ICD-10-CM | POA: Diagnosis not present

## 2022-03-05 DIAGNOSIS — Z794 Long term (current) use of insulin: Secondary | ICD-10-CM | POA: Diagnosis not present

## 2022-03-05 DIAGNOSIS — E079 Disorder of thyroid, unspecified: Secondary | ICD-10-CM | POA: Diagnosis not present

## 2022-03-05 DIAGNOSIS — Z87891 Personal history of nicotine dependence: Secondary | ICD-10-CM | POA: Diagnosis not present

## 2022-03-05 MED ORDER — ATORVASTATIN CALCIUM 40 MG PO TABS: 40.0000 mg | ORAL_TABLET | Freq: Every day | ORAL | 11 refills | Status: DC

## 2022-03-06 ENCOUNTER — Telehealth: Payer: Self-pay

## 2022-03-06 DIAGNOSIS — M5459 Other low back pain: Secondary | ICD-10-CM | POA: Diagnosis not present

## 2022-03-06 DIAGNOSIS — G2581 Restless legs syndrome: Secondary | ICD-10-CM | POA: Diagnosis not present

## 2022-03-06 DIAGNOSIS — G4733 Obstructive sleep apnea (adult) (pediatric): Secondary | ICD-10-CM | POA: Diagnosis not present

## 2022-03-06 DIAGNOSIS — Z79899 Other long term (current) drug therapy: Secondary | ICD-10-CM | POA: Diagnosis not present

## 2022-03-06 DIAGNOSIS — M542 Cervicalgia: Secondary | ICD-10-CM | POA: Diagnosis not present

## 2022-03-06 DIAGNOSIS — G6289 Other specified polyneuropathies: Secondary | ICD-10-CM | POA: Diagnosis not present

## 2022-03-06 NOTE — Telephone Encounter (Addendum)
Transition Care Management Follow-up Telephone Call Date of discharge and from where: 03/05/2022 How have you been since you were released from the hospital? Pt states he is doing good, his legs look better today. Appointment offered, patient declined, he states he thinks he is fine.  Any questions or concerns? No  Items Reviewed: Did the pt receive and understand the discharge instructions provided? Yes  Medications obtained and verified? Yes  Other? Yes  Any new allergies since your discharge? No  Dietary orders reviewed? Yes Do you have support at home? Yes   Home Care and Equipment/Supplies: Were home health services ordered? no If so, what is the name of the agency? N/a  Has the agency set up a time to come to the patient's home? no Were any new equipment or medical supplies ordered?  No What is the name of the medical supply agency? N/a Were you able to get the supplies/equipment? no Do you have any questions related to the use of the equipment or supplies? No  Functional Questionnaire: (I = Independent and D = Dependent) ADLs: i  Bathing/Dressing- i  Meal Prep- i  Eating- i  Maintaining continence- i  Transferring/Ambulation- i  Managing Meds- i  Follow up appointments reviewed:  PCP Hospital f/u appt confirmed? No  Scheduled to see robyn sanders on n/a @ n/a. [facility] f/u appt confirmed? Yes  Scheduled to see wound clinic on 03/19/2022 @ [facility]. Are transportation arrangements needed? No  If their condition worsens, is the pt aware to call PCP or go to the Emergency Dept.? Yes Was the patient provided with contact information for the PCP's office or ED? Yes Was to pt encouraged to call back with questions or concerns? Yes

## 2022-03-11 DIAGNOSIS — Z87891 Personal history of nicotine dependence: Secondary | ICD-10-CM | POA: Diagnosis not present

## 2022-03-11 DIAGNOSIS — E785 Hyperlipidemia, unspecified: Secondary | ICD-10-CM | POA: Diagnosis not present

## 2022-03-11 DIAGNOSIS — Z7901 Long term (current) use of anticoagulants: Secondary | ICD-10-CM | POA: Diagnosis not present

## 2022-03-11 DIAGNOSIS — M19072 Primary osteoarthritis, left ankle and foot: Secondary | ICD-10-CM | POA: Diagnosis not present

## 2022-03-11 DIAGNOSIS — M1990 Unspecified osteoarthritis, unspecified site: Secondary | ICD-10-CM | POA: Diagnosis not present

## 2022-03-11 DIAGNOSIS — Z791 Long term (current) use of non-steroidal anti-inflammatories (NSAID): Secondary | ICD-10-CM | POA: Diagnosis not present

## 2022-03-11 DIAGNOSIS — E079 Disorder of thyroid, unspecified: Secondary | ICD-10-CM | POA: Diagnosis not present

## 2022-03-11 DIAGNOSIS — Z85828 Personal history of other malignant neoplasm of skin: Secondary | ICD-10-CM | POA: Diagnosis not present

## 2022-03-11 DIAGNOSIS — E119 Type 2 diabetes mellitus without complications: Secondary | ICD-10-CM | POA: Diagnosis not present

## 2022-03-11 DIAGNOSIS — Z794 Long term (current) use of insulin: Secondary | ICD-10-CM | POA: Diagnosis not present

## 2022-03-11 DIAGNOSIS — M7989 Other specified soft tissue disorders: Secondary | ICD-10-CM | POA: Diagnosis not present

## 2022-03-11 DIAGNOSIS — I10 Essential (primary) hypertension: Secondary | ICD-10-CM | POA: Diagnosis not present

## 2022-03-11 DIAGNOSIS — Z79899 Other long term (current) drug therapy: Secondary | ICD-10-CM | POA: Diagnosis not present

## 2022-03-11 DIAGNOSIS — S9032XA Contusion of left foot, initial encounter: Secondary | ICD-10-CM | POA: Diagnosis not present

## 2022-03-11 DIAGNOSIS — Z8546 Personal history of malignant neoplasm of prostate: Secondary | ICD-10-CM | POA: Diagnosis not present

## 2022-03-11 DIAGNOSIS — J45909 Unspecified asthma, uncomplicated: Secondary | ICD-10-CM | POA: Diagnosis not present

## 2022-03-11 DIAGNOSIS — I4891 Unspecified atrial fibrillation: Secondary | ICD-10-CM | POA: Diagnosis not present

## 2022-03-11 DIAGNOSIS — Z79890 Hormone replacement therapy (postmenopausal): Secondary | ICD-10-CM | POA: Diagnosis not present

## 2022-03-13 ENCOUNTER — Telehealth: Payer: Self-pay

## 2022-03-13 NOTE — Telephone Encounter (Signed)
Transition Care Management Follow-up Telephone Call Date of discharge and from where: 03/11/2022 [facility]  How have you been since you were released from the hospital? Pt states he is doing well, he states his foot is lightening up, getting better.  Any questions or concerns? No  Items Reviewed: Did the pt receive and understand the discharge instructions provided? Yes  Medications obtained and verified? Yes  Other? No  Any new allergies since your discharge? No  Dietary orders reviewed? Yes Do you have support at home? Yes   Home Care and Equipment/Supplies: Were home health services ordered? no If so, what is the name of the agency? N/a  Has the agency set up a time to come to the patient's home? no Were any new equipment or medical supplies ordered?  No What is the name of the medical supply agency? N/a Were you able to get the supplies/equipment? no Do you have any questions related to the use of the equipment or supplies? No  Functional Questionnaire: (I = Independent and D = Dependent) ADLs: i  Bathing/Dressing- i  Meal Prep- i  Eating- i  Maintaining continence- i  Transferring/Ambulation- i  Managing Meds- i  Follow up appointments reviewed:  PCP Hospital f/u appt confirmed? No  Scheduled to see n/a on n/a @ n/a. [facility] f/u appt confirmed? Yes  Scheduled to see wound specialist  on 03/19/2022 @ [facility] . Are transportation arrangements needed? No  If their condition worsens, is the pt aware to call PCP or go to the Emergency Dept.? Yes Was the patient provided with contact information for the PCP's office or ED? Yes Was to pt encouraged to call back with questions or concerns? Yes

## 2022-03-16 DIAGNOSIS — E119 Type 2 diabetes mellitus without complications: Secondary | ICD-10-CM | POA: Diagnosis not present

## 2022-03-16 DIAGNOSIS — Z79899 Other long term (current) drug therapy: Secondary | ICD-10-CM | POA: Diagnosis not present

## 2022-03-16 DIAGNOSIS — Z8679 Personal history of other diseases of the circulatory system: Secondary | ICD-10-CM | POA: Diagnosis not present

## 2022-03-16 DIAGNOSIS — I739 Peripheral vascular disease, unspecified: Secondary | ICD-10-CM | POA: Diagnosis not present

## 2022-03-16 DIAGNOSIS — Z8546 Personal history of malignant neoplasm of prostate: Secondary | ICD-10-CM | POA: Diagnosis not present

## 2022-03-16 DIAGNOSIS — L97929 Non-pressure chronic ulcer of unspecified part of left lower leg with unspecified severity: Secondary | ICD-10-CM | POA: Diagnosis not present

## 2022-03-16 DIAGNOSIS — Z7982 Long term (current) use of aspirin: Secondary | ICD-10-CM | POA: Diagnosis not present

## 2022-03-16 DIAGNOSIS — Z7984 Long term (current) use of oral hypoglycemic drugs: Secondary | ICD-10-CM | POA: Diagnosis not present

## 2022-03-16 DIAGNOSIS — M79662 Pain in left lower leg: Secondary | ICD-10-CM | POA: Diagnosis not present

## 2022-03-16 DIAGNOSIS — E785 Hyperlipidemia, unspecified: Secondary | ICD-10-CM | POA: Diagnosis not present

## 2022-03-16 DIAGNOSIS — G4733 Obstructive sleep apnea (adult) (pediatric): Secondary | ICD-10-CM | POA: Diagnosis not present

## 2022-03-16 DIAGNOSIS — Z87891 Personal history of nicotine dependence: Secondary | ICD-10-CM | POA: Diagnosis not present

## 2022-03-16 DIAGNOSIS — Z7901 Long term (current) use of anticoagulants: Secondary | ICD-10-CM | POA: Diagnosis not present

## 2022-03-16 DIAGNOSIS — L97919 Non-pressure chronic ulcer of unspecified part of right lower leg with unspecified severity: Secondary | ICD-10-CM | POA: Diagnosis not present

## 2022-03-16 DIAGNOSIS — M79605 Pain in left leg: Secondary | ICD-10-CM | POA: Diagnosis not present

## 2022-03-16 DIAGNOSIS — I10 Essential (primary) hypertension: Secondary | ICD-10-CM | POA: Diagnosis not present

## 2022-03-16 DIAGNOSIS — L97529 Non-pressure chronic ulcer of other part of left foot with unspecified severity: Secondary | ICD-10-CM | POA: Diagnosis not present

## 2022-03-18 ENCOUNTER — Telehealth: Payer: Self-pay

## 2022-03-18 NOTE — Telephone Encounter (Signed)
Transition Care Management Follow-up Telephone Call Date of discharge and from where: 03/13/2022 How have you been since you were released from the hospital? Pt states he is doing way better.  Any questions or concerns? No  Items Reviewed: Did the pt receive and understand the discharge instructions provided? Yes  Medications obtained and verified? Yes  Other? No  Any new allergies since your discharge? No  Dietary orders reviewed? Yes Do you have support at home? Yes   Home Care and Equipment/Supplies: Were home health services ordered? no If so, what is the name of the agency? N/a  Has the agency set up a time to come to the patient's home? no Were any new equipment or medical supplies ordered?  No What is the name of the medical supply agency? N/a Were you able to get the supplies/equipment? no Do you have any questions related to the use of the equipment or supplies? No  Functional Questionnaire: (I = Independent and D = Dependent) ADLs: i  Bathing/Dressing- i  Meal Prep- i  Eating- i  Maintaining continence- i  Transferring/Ambulation- i  Managing Meds- I   Follow up appointments reviewed:  PCP Hospital f/u appt confirmed? No  Scheduled to see n/a on n/a @ n/a. [facility] f/u appt confirmed? Yes  Scheduled to see wound clinic  on 03/19/2022  @ [facility]. Are transportation arrangements needed? No  If their condition worsens, is the pt aware to call PCP or go to the Emergency Dept.? Yes Was the patient provided with contact information for the PCP's office or ED? Yes Was to pt encouraged to call back with questions or concerns? Yes

## 2022-03-19 DIAGNOSIS — I872 Venous insufficiency (chronic) (peripheral): Secondary | ICD-10-CM | POA: Diagnosis not present

## 2022-03-19 DIAGNOSIS — L97822 Non-pressure chronic ulcer of other part of left lower leg with fat layer exposed: Secondary | ICD-10-CM | POA: Diagnosis not present

## 2022-03-19 DIAGNOSIS — L97812 Non-pressure chronic ulcer of other part of right lower leg with fat layer exposed: Secondary | ICD-10-CM | POA: Diagnosis not present

## 2022-03-27 DIAGNOSIS — I2510 Atherosclerotic heart disease of native coronary artery without angina pectoris: Secondary | ICD-10-CM | POA: Diagnosis not present

## 2022-03-27 DIAGNOSIS — I10 Essential (primary) hypertension: Secondary | ICD-10-CM | POA: Diagnosis not present

## 2022-03-27 DIAGNOSIS — I480 Paroxysmal atrial fibrillation: Secondary | ICD-10-CM | POA: Diagnosis not present

## 2022-03-27 DIAGNOSIS — E782 Mixed hyperlipidemia: Secondary | ICD-10-CM | POA: Diagnosis not present

## 2022-04-02 DIAGNOSIS — Z8249 Family history of ischemic heart disease and other diseases of the circulatory system: Secondary | ICD-10-CM | POA: Diagnosis not present

## 2022-04-02 DIAGNOSIS — I10 Essential (primary) hypertension: Secondary | ICD-10-CM | POA: Diagnosis not present

## 2022-04-02 DIAGNOSIS — I4891 Unspecified atrial fibrillation: Secondary | ICD-10-CM | POA: Diagnosis not present

## 2022-04-02 DIAGNOSIS — R002 Palpitations: Secondary | ICD-10-CM | POA: Diagnosis not present

## 2022-04-02 DIAGNOSIS — I480 Paroxysmal atrial fibrillation: Secondary | ICD-10-CM | POA: Diagnosis not present

## 2022-04-02 DIAGNOSIS — Z955 Presence of coronary angioplasty implant and graft: Secondary | ICD-10-CM | POA: Diagnosis not present

## 2022-04-02 DIAGNOSIS — G2581 Restless legs syndrome: Secondary | ICD-10-CM | POA: Diagnosis not present

## 2022-04-02 DIAGNOSIS — Z791 Long term (current) use of non-steroidal anti-inflammatories (NSAID): Secondary | ICD-10-CM | POA: Diagnosis not present

## 2022-04-02 DIAGNOSIS — Z79899 Other long term (current) drug therapy: Secondary | ICD-10-CM | POA: Diagnosis not present

## 2022-04-02 DIAGNOSIS — Z7984 Long term (current) use of oral hypoglycemic drugs: Secondary | ICD-10-CM | POA: Diagnosis not present

## 2022-04-02 DIAGNOSIS — Z7901 Long term (current) use of anticoagulants: Secondary | ICD-10-CM | POA: Diagnosis not present

## 2022-04-02 DIAGNOSIS — Z87891 Personal history of nicotine dependence: Secondary | ICD-10-CM | POA: Diagnosis not present

## 2022-04-02 DIAGNOSIS — Z79890 Hormone replacement therapy (postmenopausal): Secondary | ICD-10-CM | POA: Diagnosis not present

## 2022-04-02 DIAGNOSIS — E785 Hyperlipidemia, unspecified: Secondary | ICD-10-CM | POA: Diagnosis not present

## 2022-04-02 DIAGNOSIS — G4733 Obstructive sleep apnea (adult) (pediatric): Secondary | ICD-10-CM | POA: Diagnosis not present

## 2022-04-02 DIAGNOSIS — Z794 Long term (current) use of insulin: Secondary | ICD-10-CM | POA: Diagnosis not present

## 2022-04-02 DIAGNOSIS — I2510 Atherosclerotic heart disease of native coronary artery without angina pectoris: Secondary | ICD-10-CM | POA: Diagnosis not present

## 2022-04-02 DIAGNOSIS — E119 Type 2 diabetes mellitus without complications: Secondary | ICD-10-CM | POA: Diagnosis not present

## 2022-04-02 DIAGNOSIS — Z7982 Long term (current) use of aspirin: Secondary | ICD-10-CM | POA: Diagnosis not present

## 2022-04-04 DIAGNOSIS — L97212 Non-pressure chronic ulcer of right calf with fat layer exposed: Secondary | ICD-10-CM | POA: Diagnosis not present

## 2022-04-04 DIAGNOSIS — L97812 Non-pressure chronic ulcer of other part of right lower leg with fat layer exposed: Secondary | ICD-10-CM | POA: Diagnosis not present

## 2022-04-04 DIAGNOSIS — L97222 Non-pressure chronic ulcer of left calf with fat layer exposed: Secondary | ICD-10-CM | POA: Diagnosis not present

## 2022-04-04 DIAGNOSIS — L97822 Non-pressure chronic ulcer of other part of left lower leg with fat layer exposed: Secondary | ICD-10-CM | POA: Diagnosis not present

## 2022-04-04 DIAGNOSIS — E11622 Type 2 diabetes mellitus with other skin ulcer: Secondary | ICD-10-CM | POA: Diagnosis not present

## 2022-04-04 DIAGNOSIS — I872 Venous insufficiency (chronic) (peripheral): Secondary | ICD-10-CM | POA: Diagnosis not present

## 2022-04-09 DIAGNOSIS — I2510 Atherosclerotic heart disease of native coronary artery without angina pectoris: Secondary | ICD-10-CM | POA: Diagnosis not present

## 2022-04-09 DIAGNOSIS — E1169 Type 2 diabetes mellitus with other specified complication: Secondary | ICD-10-CM | POA: Diagnosis not present

## 2022-04-09 DIAGNOSIS — E785 Hyperlipidemia, unspecified: Secondary | ICD-10-CM | POA: Diagnosis not present

## 2022-04-09 DIAGNOSIS — I152 Hypertension secondary to endocrine disorders: Secondary | ICD-10-CM | POA: Diagnosis not present

## 2022-04-09 DIAGNOSIS — Z794 Long term (current) use of insulin: Secondary | ICD-10-CM | POA: Diagnosis not present

## 2022-04-09 DIAGNOSIS — E1165 Type 2 diabetes mellitus with hyperglycemia: Secondary | ICD-10-CM | POA: Diagnosis not present

## 2022-04-09 DIAGNOSIS — E1159 Type 2 diabetes mellitus with other circulatory complications: Secondary | ICD-10-CM | POA: Diagnosis not present

## 2022-04-09 DIAGNOSIS — E1140 Type 2 diabetes mellitus with diabetic neuropathy, unspecified: Secondary | ICD-10-CM | POA: Diagnosis not present

## 2022-04-17 DIAGNOSIS — I4891 Unspecified atrial fibrillation: Secondary | ICD-10-CM | POA: Diagnosis not present

## 2022-04-29 DIAGNOSIS — R001 Bradycardia, unspecified: Secondary | ICD-10-CM | POA: Diagnosis not present

## 2022-04-29 DIAGNOSIS — I10 Essential (primary) hypertension: Secondary | ICD-10-CM | POA: Diagnosis not present

## 2022-04-29 DIAGNOSIS — Z7901 Long term (current) use of anticoagulants: Secondary | ICD-10-CM | POA: Diagnosis not present

## 2022-04-29 DIAGNOSIS — I480 Paroxysmal atrial fibrillation: Secondary | ICD-10-CM | POA: Diagnosis not present

## 2022-04-29 DIAGNOSIS — E782 Mixed hyperlipidemia: Secondary | ICD-10-CM | POA: Diagnosis not present

## 2022-04-30 ENCOUNTER — Other Ambulatory Visit: Payer: Medicare Other

## 2022-04-30 DIAGNOSIS — G4733 Obstructive sleep apnea (adult) (pediatric): Secondary | ICD-10-CM | POA: Diagnosis not present

## 2022-04-30 DIAGNOSIS — M5459 Other low back pain: Secondary | ICD-10-CM | POA: Diagnosis not present

## 2022-04-30 DIAGNOSIS — G2581 Restless legs syndrome: Secondary | ICD-10-CM | POA: Diagnosis not present

## 2022-04-30 DIAGNOSIS — G603 Idiopathic progressive neuropathy: Secondary | ICD-10-CM | POA: Diagnosis not present

## 2022-04-30 NOTE — Telephone Encounter (Signed)
Chmg-error.  

## 2022-05-01 ENCOUNTER — Other Ambulatory Visit: Payer: Medicare Other

## 2022-05-01 DIAGNOSIS — I700 Atherosclerosis of aorta: Secondary | ICD-10-CM | POA: Diagnosis not present

## 2022-05-02 ENCOUNTER — Other Ambulatory Visit: Payer: Self-pay | Admitting: Internal Medicine

## 2022-05-02 LAB — LIPID PANEL
Chol/HDL Ratio: 2.5 ratio (ref 0.0–5.0)
Cholesterol, Total: 162 mg/dL (ref 100–199)
HDL: 66 mg/dL (ref >39)
LDL Chol Calc (NIH): 82 mg/dL (ref 0–99)
Triglycerides: 72 mg/dL (ref 0–149)
VLDL Cholesterol Cal: 14 mg/dL (ref 5–40)

## 2022-05-02 MED ORDER — ATORVASTATIN CALCIUM 40 MG PO TABS: 40.0000 mg | ORAL_TABLET | Freq: Every day | ORAL | 2 refills | Status: DC

## 2022-05-17 ENCOUNTER — Telehealth: Payer: Self-pay

## 2022-05-17 NOTE — Progress Notes (Signed)
     Franklin Thornton was reminded to have all medications, supplements and any blood glucose and blood pressure readings available for review with Orlando Penner, Pharm. D, at his telephone visit on 05-21-2022 at 9:00.    [place] Pharmacist Assistant [phone]

## 2022-05-21 ENCOUNTER — Ambulatory Visit: Payer: Medicare Other

## 2022-05-21 MED ORDER — OZEMPIC (1 MG/DOSE) 4 MG/3ML ~~LOC~~ SOPN: 1.0000 mg | PEN_INJECTOR | SUBCUTANEOUS | 1 refills | Status: AC

## 2022-05-21 NOTE — Progress Notes (Signed)
Care Management & Coordination Services Pharmacy Note  05/21/2022 Name:  Franklin Thornton MRN:  [id number] DOB:  08/30/1945  Summary: Franklin Thornton and his wife are concerned about his mental health changes including being angry all the time, and outbursts of frustration.    Recommendations/Changes made from today's visit: Recommend patient use medication adherence tool Recommend changing current Diabetes regimen due to A1c increase Recommend being seen by PCP PHQ-9 completed today, score 17, recommend patient be seen by PCP  Follow up plan: At this time Franklin Thornton is not interested in changing the way he is currently taking his medication or using any other strategies  Collaborated with PCP patient to be seen in office on 05/24/2022 at 1 PM, patient to discuss interest in a counselor/therapy sessions and help with his concerns about depression and anxiety  2.   HC to call patient once per week to review eating habits including the following: PLATE method, non-starchy vegetable intake, CGM  3.  Collaborate with PCP to add Ozempic 1 mg back to patients chart, medication end date of 05/15/2022, per PCP okay to fill    Subjective: Franklin Thornton is an 77 y.o. year old male who is a primary patient of [name], MD.  The care coordination team was consulted for assistance with disease management and care coordination needs.  Franklin Thornton reports that his mental health has been stressed lately. He is having issues with his vehicle, and things are overwhelming to take care of. He is also frustrated with things going bad with him and he is getting angry really easily. Franklin Thornton his wife is on the phone as well. She reports that he is angry almost everyday and that is not like him. He had three cars have issues in three days and he is very upset. He reports that even when he is playing pool, which used to be fun, he gets very upset and angry and he can't control it.   Franklin Thornton also reports that her  husband has ADHD and is always overwhelmed and she thinks that he might need a Social worker. Franklin Thornton also reports the he is also very irresponsible. They have been married for 85 years and she is 77 years old.   Engaged with patient by telephone for follow up visit.  Recent office visits: 02/12/2022 PCP [facility]   Recent consult visits: 04/09/2022 Specialist [facility] - Endocrinology [facility]  02/22/2022 Specialist [facility] - Endocrinology [facility] visits: None in previous 6 months   Objective:  Lab Results  Component Value Date   CREATININE 0.78 02/12/2022   BUN 18 02/12/2022   EGFR 92 02/12/2022   GFRNONAA 68 03/14/2020   GFRAA 79 03/14/2020   NA 139 02/12/2022   K 4.8 02/12/2022   CALCIUM 9.6 02/12/2022   CO2 26 02/12/2022   GLUCOSE 123 (H) 02/12/2022    Lab Results  Component Value Date/Time   HGBA1C 7.4 (H) 02/12/2022 12:21 PM   HGBA1C 6.9 (H) 11/15/2021 11:49 AM   MICROALBUR 30 07/30/2018 12:32 PM    Last diabetic Eye exam:  Lab Results  Component Value Date/Time   HMDIABEYEEXA No Retinopathy 08/21/2021 12:00 AM    Last diabetic Foot exam: No results found for: "HMDIABFOOTEX"   Lab Results  Component Value Date   CHOL 162 05/01/2022   HDL 66 05/01/2022   LDLCALC 82 05/01/2022   TRIG 72 05/01/2022   CHOLHDL 2.5 05/01/2022       Latest Ref Rng &  Units 02/12/2022   12:21 PM 05/23/2021    4:14 PM 03/08/2021    4:14 PM  Hepatic Function  Total Protein 6.0 - 8.5 g/dL 6.5  6.2  6.3   Albumin 3.8 - 4.8 g/dL 4.0  3.7  3.7   AST 0 - 40 IU/L '19  20  19   '$ ALT 0 - 44 IU/L 28  29  34   Alk Phosphatase 44 - 121 IU/L 77  81  78   Total Bilirubin 0.0 - 1.2 mg/dL 0.9  0.4  0.5     Lab Results  Component Value Date/Time   TSH 3.330 11/15/2021 11:49 AM   TSH 1.700 04/04/2021 12:01 PM   FREET4 1.62 11/15/2021 11:49 AM   FREET4 1.72 04/04/2021 12:01 PM       Latest Ref Rng & Units 02/12/2022   12:21 PM 05/15/2021   12:00 AM 08/22/2020    4:14 PM  CBC  WBC 3.4 - 10.8 x10E3/uL  8.4   7.3   Hemoglobin 13.0 - 17.7 g/dL 15.7  7.9     16.3   Hematocrit 37.5 - 51.0 % 45.4   47.7   Platelets 150 - 450 x10E3/uL 208   239      This result is from an external source.    Lab Results  Component Value Date/Time   VITAMINB12 1,032 05/23/2021 04:14 PM   VITAMINB12 795 08/22/2020 04:14 PM    Clinical ASCVD: Yes       02/12/2022   11:16 AM 01/10/2022    2:13 PM 12/07/2020    9:21 AM  Depression screen PHQ 2/9  Decreased Interest 0 1 0  Down, Depressed, Hopeless 1 1 0  PHQ - 2 Score 1 2 0  Altered sleeping 3 0   Tired, decreased energy 0 3   Change in appetite 3 0   Feeling bad or failure about yourself  0 0   Trouble concentrating 1 1   Moving slowly or fidgety/restless 0 0   Suicidal thoughts 0 0   PHQ-9 Score 8 6   Difficult doing work/chores Somewhat difficult Not difficult at all      Social History   Tobacco Use  Smoking Status Former   Packs/day: 2.50   Years: 12.00   Total pack years: 30.00   Types: Cigarettes   Start date: 04/23/1959   Quit date: 08/14/1971   Years since quitting: 50.8  Smokeless Tobacco Never  Tobacco Comments   he has quit   BP Readings from Last 3 Encounters:  02/12/22 134/70  01/10/22 126/68  11/15/21 140/90   Pulse Readings from Last 3 Encounters:  02/12/22 87  01/10/22 83  11/15/21 64   Wt Readings from Last 3 Encounters:  02/12/22 199 lb 6.4 oz (90.4 kg)  01/10/22 194 lb (88 kg)  11/15/21 205 lb (93 kg)   BMI Readings from Last 3 Encounters:  02/12/22 27.81 kg/m  01/10/22 27.06 kg/m  11/15/21 33.09 kg/m    Allergies  Allergen Reactions   No Known Allergies     Medications Reviewed Today     Reviewed by Debbora Dus, CMA (Certified Medical Assistant) on 03/18/22 at [place] List Status: <None>   Medication Order Taking? Sig Documenting Provider Last Dose Status Informant  albuterol (PROVENTIL HFA;VENTOLIN HFA) 108 (90 Base) MCG/ACT inhaler [id number] Yes Inhale 2 puffs into the lungs every  6 (six) hours as needed for wheezing or shortness of breath. [provider] Taking Active  albuterol (PROVENTIL) (2.5 MG/3ML) 0.083% nebulizer solution [id number] Yes Take 3 mLs (2.5 mg total) by nebulization every 6 (six) hours as needed for wheezing or shortness of breath. [name], FNP Taking Active   amiodarone (PACERONE) 100 MG tablet [id number] Yes Take 100 mg by mouth daily.  [provider] Taking Active   amoxicillin-clavulanate (AUGMENTIN) 875-125 MG tablet [id number] No Take 1 tablet by mouth 2 (two) times daily.  Patient not taking: Reported on 01/10/2022   [name], MD Not Taking Active   amoxicillin-clavulanate (AUGMENTIN) 875-125 MG tablet [id number] Yes Take 1 tablet by mouth 2 (two) times daily. [name], MD Taking Active   amphetamine-dextroamphetamine (ADDERALL) 10 MG tablet [id number] Yes Take 1 tablet by mouth daily. Take 2 tablets in the morning and one tablet in the evenings. [provider] Taking Active   Armodafinil 250 MG tablet [id number] Yes Take 125-250 mg by mouth every morning. [provider] Taking Active   aspirin 81 MG tablet [id number] Yes Take 81 mg by mouth daily. [provider] Taking Active   atorvastatin (LIPITOR) 40 MG tablet [id number] Yes Take 1 tablet (40 mg total) by mouth daily. [name], MD Taking Active   azelastine (ASTELIN) 0.1 % nasal spray [id number] Yes Place 1 spray into both nostrils 2 (two) times daily. Use in each nostril as directed [name], MD Taking Active   b complex vitamins capsule [id number] Yes Take 1 capsule by mouth daily. [provider] Taking Active   carvedilol (COREG) 6.25 MG tablet [id number] Yes Take 6.25 mg by mouth 2 (two) times daily with a meal. [provider] Taking Active   Cholecalciferol (VITAMIN D) 2000 units tablet [id number] Yes Take 2,000 Units by mouth daily. [provider] Taking Active   cyanocobalamin ((VITAMIN B-12))  injection 1,000 mcg [id number]   [name], MD  Active   diphenhydrAMINE (BENADRYL) 25 MG tablet [id number] Yes Take 25 mg by mouth every 6 (six) hours as needed for itching.  [provider] Taking Active   donepezil (ARICEPT) 10 MG tablet [id number] Yes TAKE 1 TABLET AT BEDTIME [name], MD Taking Active   DULoxetine (CYMBALTA) 30 MG capsule [id number] Yes TAKE 1 CAPSULE DAILY IN THE Fredric Mare, MD Taking Active   furosemide (LASIX) 20 MG tablet [id number] Yes Take 20 mg by mouth daily as needed.  [provider] Taking Active   glucose blood (FREESTYLE LITE) test strip [id number] Yes Use as instructed to check blood sugars  3 times per day dx: e11.65 [name], MD Taking Active   glucose monitoring kit (FREESTYLE) monitoring kit [id number] Yes 1 each by Does not apply route as needed for other. [provider] Taking Active   Insulin Pen Needle (NOVOFINE PEN NEEDLE) 32G X 6 MM MISC [id number] Yes Use with insulin pens [name], MD Taking Active   ipratropium (ATROVENT) 0.03 % nasal spray [id number] Yes Place 2 sprays into both nostrils every 12 (twelve) hours. [provider] Taking Active   JARDIANCE 10 MG TABS tablet [id number] Yes TAKE 1 TABLET DAILY [name], MD Taking Active   LANTUS SOLOSTAR 100 UNIT/ML Solostar Pen [id number] Yes Inject 26 Units into the skin daily. [provider] Taking Active   levocetirizine (XYZAL) 5 MG tablet [id number] Yes Take 5 mg by mouth every evening.  [provider] Taking Active   lidocaine (LIDODERM) 5 % [id number] Yes Place 3 patches onto the skin daily. May use 1-3 patches at one time, Remove &  Discard patch within 12 hours or as directed by MD [name], MD Taking Active   losartan (COZAAR) 50 MG tablet [id number] Yes Take 50 mg by mouth 2 (two) times daily.  [provider] Taking Active   Magnesium 250 MG TABS [id number] Yes Take 1 tablet by mouth daily. [provider] Taking Active   meloxicam (MOBIC) 15 MG tablet [id number] Yes Take 15 mg by mouth daily. [provider] Taking Active   methocarbamol (ROBAXIN) 750 MG tablet [id number] Yes Take 750 mg by mouth 3 (three) times daily as needed for muscle spasms.  [provider] Taking Active   mirabegron ER (MYRBETRIQ) 50 MG TB24 tablet [id number] Yes Take 50 mg by mouth daily. [provider] Taking Active   montelukast (SINGULAIR) 10 MG tablet [id number] Yes Take 10 mg by mouth at bedtime. [provider] Taking Active   Multiple Vitamin (MULTIVITAMIN) tablet [id number] Yes Take 1 tablet by mouth daily. [provider] Taking Active   nitroGLYCERIN (NITROSTAT) 0.4 MG SL tablet [id number] Yes Place 0.4 mg under the tongue every 5 (five) minutes as needed for chest pain.  [provider] Taking Active   Omega-3 Fatty Acids (FISH OIL) 1200 MG CAPS [id number] Yes Take 1,200 mg by mouth.  [provider] Taking Active   oxyCODONE-acetaminophen (PERCOCET) 5-325 MG tablet [id number] Yes Take 1 tablet by mouth every 6 (six) hours as needed for severe pain. [name], MD Taking Active   pantoprazole (PROTONIX) 40 MG tablet [id number] Yes TAKE 1 TABLET DAILY [name], MD Taking Active   pramipexole (MIRAPEX) 0.5 MG tablet [id number] Yes Take 2 tablets (1 mg total) by mouth in the morning and at bedtime.  Patient taking differently: Take 1 mg by mouth in the morning and at bedtime. One in the morning one in the afternoon and 2 at night.   [name], MD Taking Active   rivaroxaban (XARELTO) 10 MG TABS tablet [id number] Yes Take 10 mg by mouth daily. [provider] Taking Active   Semaglutide, 1 MG/DOSE, 4 MG/3ML SOPN [id number] Yes Inject 1 mg into the skin once a week. [name], MD Taking Active   solifenacin (VESICARE) 5 MG tablet [id number] Yes Take 1 tablet (5 mg total) by mouth daily. [name], MD Taking Active   SYNTHROID 50  MCG tablet [id number] Yes TAKE 1 TABLET DAILY [name], MD Taking Active   vitamin B-12 (CYANOCOBALAMIN) 1000 MCG tablet [id number] Yes Take 1,000 mcg by mouth daily. [provider] Taking Active            Med Note Simona Huh Mar 20, 2021  2:43 PM)    Grant Ruts INHUB 100-50 MCG/ACT AEPB [id number] Yes Inhale 1 puff into the lungs 2 (two) times daily. [name], [place] Taking Active   Med List Note [facility], Bailey Mech, MD 12/06/20 1448): NOrco [facility]            SDOH:  (Social Determinants of Health) assessments and interventions performed: Yes SDOH Interventions    Flowsheet Row Clinical Support from 01/10/2022 in [facility] Most recent reading at 01/10/2022  2:10 PM Chronic Care Management from 11/18/2019 in [facility] Most recent reading at 12/20/2019  9:08 PM Clinical Support from 11/24/2019 in [facility] Most recent reading at 11/24/2019  9:05 AM  SDOH Interventions     Food Insecurity Interventions Intervention Not Indicated -- --  Transportation Interventions Intervention Not Indicated -- --  Depression Interventions/Treatment  -- -- Currently on Treatment  Financial Strain Interventions Intervention Not Indicated Intervention Not Indicated --  Physical Activity Interventions Patient Refused, Other (Comments) -- --  Stress Interventions Intervention Not Indicated -- --       Medication Assistance: None required.  Patient affirms current coverage meets needs.  Medication Access: Within the past 30 days, how often has patient missed a dose of medication? He has missed his medications often  Is a pillbox or other method used to improve adherence?  Yes, he has two pill boxes that he uses Factors that may affect medication adherence? nonadherence to medications, lack of understanding of disease management, and "forgetting to take his  medications" Are meds synced by current pharmacy? No  Are meds delivered by current pharmacy? Yes  Does patient experience delays in picking up medications due to transportation concerns? No   [facility] Reviewed: Is patient disadvantaged to use [facility]?:  Uncertain, he is concerned that he will end up paying more because it is not mail order.  Current Rx insurance plan: Medicare Part A & B, Tricare for Life  Name and location of Current pharmacy:  [place] [address] [place] [address] Phone: [phone] Fax: [place], [place] AT [facility] [place] [address] Phone: [phone] Fax: [phone]  [facility] [phone] - [place], [place] [address] Phone: [phone] Fax: [phone]  [place] [address] Phone: [phone] Fax: [phone]  [facility] services reviewed with patient today?: Yes  Patient requests to transfer care to [facility]?: No  Reason patient declined to change pharmacies: Disadvantaged due to insurance/mail order and Resistance to change  Compliance/Adherence/Medication fill history: Care Gaps: COVID-19 booster   Star-Rating Drugs: Atorvastatin 40 mg tablet  Losartan 100 mg tablet  Jardiance 10 mg tablet    Assessment/Plan   Diabetes (A1c goal <8%) -Controlled -Current medications: Jardiance 10 mg tablet once per day Appropriate, Effective, Safe, Accessible Ozempic 1 mg once per week Appropriate, Effective, Safe, Query accessible Franklin Thornton has been out of this for 6 months due to shortages  Lantus Solostar 100 unit/ml inject 26 units into the skin daily Appropriate,  Effective, Safe, Accessible Humalog- 3 units for every 50 over 200, anything over 250 he uses another 3 units,  Per patient Dr. Donney Rankins told him he could take 8 units for anything over 250  -Current home glucose readings: he reports that his BS has been erratic, whenever he eats his BS goes up to 300 - Dexcom through Dr. Donney Rankins     Target Date(s)  Number of days worn ? 14 days 14 days   % of time active ? 70%   Mean Glucose (mg/dL)  201  GMI (2-week A1c estimate)   8.1%  Glycemic Variability (%CV) ?36%   Time above >250 mg/dL <5% 21%  Time above 70-180 mg/dL <25% 37%  Time in range: 70-180 mg/dL >70% 42%  Time below 70 mg/dL <4% 0  Time below 54 mg/dL <1% 0    -Denies hypoglycemic/hyperglycemic symptoms -Current meal patterns:  breakfast: biscuits and gravy   dinner: Quesadilla soup and crackers  Evening snacks: he is eating a lot at night he had a peanut butter and banana sandwich, and a glass milk  drinks: 32-48 ounces of whole milk,  -Current exercise: will discuss during next visit  -Educated on Benefits of routine self-monitoring of blood sugar; Continuous glucose monitoring; Carbohydrate counting and/or plate method  -We discussed the method in detail  -Counseled to check feet daily and get yearly eye exams -We also reviewed a label for a canned good that he enjoys eating, including a pack of waverly crackers   -He reports that when he makes a meal he was dumping the whole can and eating it.  Franklin Thornton has a goal of increasing his non-starchy vegetables for all of his meals  Upon reviewing Mr. Desa current medication noted that he was prescribed Trulicity by [facility] in December 2023 but he did not take the medication and reports not having it on hand.  -Patient to restart Ozempic at 0.25 mg for 30 days, and then increase his dose to 0.5 mg for at least 4 weeks before restarting his regular 1 mg dose from 6 months ago.  -Recommended to continue current medication Educated on  reading canned good labels and portion sizes Collaborate with PCP to determine next steps for Franklin Thornton restart.    Orlando Penner, CPP, PharmD Clinical Pharmacist Practitioner [facility] [phone]

## 2022-05-24 ENCOUNTER — Ambulatory Visit (INDEPENDENT_AMBULATORY_CARE_PROVIDER_SITE_OTHER): Payer: Medicare Other | Admitting: Internal Medicine

## 2022-05-24 ENCOUNTER — Encounter: Payer: Self-pay | Admitting: Internal Medicine

## 2022-05-24 VITALS — BP 118/70 | HR 67 | Temp 97.9°F | Ht 71.0 in | Wt 207.2 lb

## 2022-05-24 DIAGNOSIS — I700 Atherosclerosis of aorta: Secondary | ICD-10-CM | POA: Diagnosis not present

## 2022-05-24 DIAGNOSIS — M549 Dorsalgia, unspecified: Secondary | ICD-10-CM | POA: Diagnosis not present

## 2022-05-24 DIAGNOSIS — M5416 Radiculopathy, lumbar region: Secondary | ICD-10-CM | POA: Diagnosis not present

## 2022-05-24 DIAGNOSIS — E1122 Type 2 diabetes mellitus with diabetic chronic kidney disease: Secondary | ICD-10-CM

## 2022-05-24 DIAGNOSIS — N182 Chronic kidney disease, stage 2 (mild): Secondary | ICD-10-CM | POA: Diagnosis not present

## 2022-05-24 DIAGNOSIS — F4323 Adjustment disorder with mixed anxiety and depressed mood: Secondary | ICD-10-CM | POA: Diagnosis not present

## 2022-05-24 DIAGNOSIS — L03115 Cellulitis of right lower limb: Secondary | ICD-10-CM

## 2022-05-24 DIAGNOSIS — R454 Irritability and anger: Secondary | ICD-10-CM | POA: Diagnosis not present

## 2022-05-24 DIAGNOSIS — R2689 Other abnormalities of gait and mobility: Secondary | ICD-10-CM | POA: Diagnosis not present

## 2022-05-24 DIAGNOSIS — I1310 Hypertensive heart and chronic kidney disease without heart failure, with stage 1 through stage 4 chronic kidney disease, or unspecified chronic kidney disease: Secondary | ICD-10-CM

## 2022-05-24 NOTE — Progress Notes (Signed)
I,[name],acting as a scribe for [name], MD.,have documented all relevant documentation on the behalf of [name], MD,as directed by  [name], MD while in the presence of [name], MD.    Subjective:     Patient ID: Franklin Thornton , male    DOB: 1945-08-30 , 77 y.o.   MRN: [id number]   Chief Complaint  Patient presents with   Back Pain    HPI  He presents today, accompanied by his wife, for further evaluation of mid/low back pain. Pain is 10/10.  He does have radiation to LE, along with intermittent paresthesias.      He adds that he had recent appt with [facility] Pharmacist.  He reports feeling more anxious/depressed during the appt.  Wife also reports he has anger issues.  He is often frustrated if he can't accomplish something he has been able to do in the past.     He reports compliance with meds. He denies headaches, chest pain and shortness of breath.    Back Pain This is a chronic problem. The current episode started more than 1 month ago. The problem occurs constantly. The problem has been gradually worsening since onset. The pain is present in the lumbar spine and thoracic spine. The quality of the pain is described as shooting. The pain is at a severity of 10/10. The pain is severe.     Past Medical History:  Diagnosis Date   Atrial fibrillation ([place])    Diabetes mellitus without complication ([place])    Peripheral vascular disease ([place])    Prostate cancer ([place])    Uvular edema 07/17/2015     Family History  Problem Relation Age of Onset   Lung cancer Mother    Diabetes Mother    Esophageal cancer Father    Hypertension Sister    Breast cancer Sister    Healthy Son    Healthy Daughter      Current Outpatient Medications:    albuterol (PROVENTIL HFA;VENTOLIN HFA) 108 (90 Base) MCG/ACT inhaler, Inhale 2 puffs into the lungs every 6 (six) hours as needed for wheezing or shortness of breath., Disp: , Rfl:    albuterol (PROVENTIL)  (2.5 MG/3ML) 0.083% nebulizer solution, Take 3 mLs (2.5 mg total) by nebulization every 6 (six) hours as needed for wheezing or shortness of breath., Disp: 75 mL, Rfl: 3   amiodarone (PACERONE) 100 MG tablet, Take 100 mg by mouth daily. , Disp: , Rfl:    amphetamine-dextroamphetamine (ADDERALL) 10 MG tablet, Take 1 tablet by mouth daily. Take 2 tablets in the morning and one tablet in the evenings., Disp: , Rfl:    Armodafinil 250 MG tablet, Take 125-250 mg by mouth every morning., Disp: , Rfl:    aspirin 81 MG tablet, Take 81 mg by mouth daily., Disp: , Rfl:    atorvastatin (LIPITOR) 40 MG tablet, Take 1 tablet (40 mg total) by mouth daily., Disp: 90 tablet, Rfl: 2   b complex vitamins capsule, Take 1 capsule by mouth daily., Disp: , Rfl:    carvedilol (COREG) 6.25 MG tablet, Take 6.25 mg by mouth 2 (two) times daily with a meal., Disp: , Rfl:    Cholecalciferol (VITAMIN D) 2000 units tablet, Take 2,000 Units by mouth daily., Disp: , Rfl:    diphenhydrAMINE (BENADRYL) 25 MG tablet, Take 25 mg by mouth every 6 (six) hours as needed for itching. , Disp: , Rfl:    donepezil (ARICEPT) 10 MG  tablet, TAKE 1 TABLET AT BEDTIME, Disp: 90 tablet, Rfl: 3   DULoxetine (CYMBALTA) 30 MG capsule, TAKE 1 CAPSULE DAILY IN THE EVENING, Disp: 90 capsule, Rfl: 3   furosemide (LASIX) 20 MG tablet, Take 20 mg by mouth daily as needed. , Disp: , Rfl:    glucose blood (FREESTYLE LITE) test strip, Use as instructed to check blood sugars  3 times per day dx: e11.65, Disp: 300 each, Rfl: 1   glucose monitoring kit (FREESTYLE) monitoring kit, 1 each by Does not apply route as needed for other., Disp: , Rfl:    Insulin Pen Needle (NOVOFINE PEN NEEDLE) 32G X 6 MM MISC, Use with insulin pens, Disp: 300 each, Rfl: 3   ipratropium (ATROVENT) 0.03 % nasal spray, Place 2 sprays into both nostrils every 12 (twelve) hours., Disp: , Rfl:    JARDIANCE 10 MG TABS tablet, TAKE 1 TABLET DAILY, Disp: 90 tablet, Rfl: 3   LANTUS SOLOSTAR 100  UNIT/ML Solostar Pen, Inject 26 Units into the skin daily., Disp: , Rfl:    levocetirizine (XYZAL) 5 MG tablet, Take 5 mg by mouth every evening. , Disp: , Rfl:    lidocaine (LIDODERM) 5 %, Place 3 patches onto the skin daily. May use 1-3 patches at one time, Remove & Discard patch within 12 hours or as directed by MD, Disp: 90 patch, Rfl: 0   losartan (COZAAR) 50 MG tablet, Take 50 mg by mouth 2 (two) times daily. , Disp: , Rfl:    Magnesium 250 MG TABS, Take 1 tablet by mouth daily., Disp: , Rfl:    meloxicam (MOBIC) 15 MG tablet, Take 15 mg by mouth daily., Disp: , Rfl:    methocarbamol (ROBAXIN) 750 MG tablet, Take 750 mg by mouth 3 (three) times daily as needed for muscle spasms. , Disp: , Rfl:    mirabegron ER (MYRBETRIQ) 50 MG TB24 tablet, Take 50 mg by mouth daily., Disp: , Rfl:    montelukast (SINGULAIR) 10 MG tablet, Take 10 mg by mouth at bedtime., Disp: , Rfl:    Multiple Vitamin (MULTIVITAMIN) tablet, Take 1 tablet by mouth daily., Disp: , Rfl:    Omega-3 Fatty Acids (FISH OIL) 1200 MG CAPS, Take 1,200 mg by mouth. , Disp: , Rfl:    pramipexole (MIRAPEX) 0.5 MG tablet, Take 2 tablets (1 mg total) by mouth in the morning and at bedtime. (Patient taking differently: Take 1 mg by mouth in the morning and at bedtime. One in the morning one in the afternoon and 2 at night.), Disp: 90 tablet, Rfl: 1   rivaroxaban (XARELTO) 10 MG TABS tablet, Take 10 mg by mouth daily., Disp: , Rfl:    Semaglutide, 1 MG/DOSE, (OZEMPIC, 1 MG/DOSE,) 4 MG/3ML SOPN, Inject 1 mg into the skin once a week., Disp: 9 mL, Rfl: 1   solifenacin (VESICARE) 5 MG tablet, Take 1 tablet (5 mg total) by mouth daily., Disp: 90 tablet, Rfl: 1   SYNTHROID 50 MCG tablet, TAKE 1 TABLET DAILY, Disp: 90 tablet, Rfl: 3   vitamin B-12 (CYANOCOBALAMIN) 1000 MCG tablet, Take 1,000 mcg by mouth daily., Disp: , Rfl:    WIXELA INHUB 100-50 MCG/ACT AEPB, Inhale 1 puff into the lungs 2 (two) times daily., Disp: 60 each, Rfl: 1   azelastine  (ASTELIN) 0.1 % nasal spray, Place 1 spray into both nostrils 2 (two) times daily. Use in each nostril as directed (Patient not taking: Reported on 05/24/2022), Disp: 30 mL, Rfl: 12   nitroGLYCERIN (NITROSTAT)  0.4 MG SL tablet, Place 0.4 mg under the tongue every 5 (five) minutes as needed for chest pain.  (Patient not taking: Reported on 05/24/2022), Disp: , Rfl:    oxyCODONE-acetaminophen (PERCOCET) 5-325 MG tablet, Take 1 tablet by mouth every 6 (six) hours as needed for severe pain. (Patient not taking: Reported on 05/24/2022), Disp: 30 tablet, Rfl: 0   pantoprazole (PROTONIX) 40 MG tablet, TAKE 1 TABLET DAILY, Disp: 90 tablet, Rfl: 3  Current Facility-Administered Medications:    cyanocobalamin ((VITAMIN B-12)) injection 1,000 mcg, 1,000 mcg, Intramuscular, Once, [name], MD   Allergies  Allergen Reactions   No Known Allergies      Review of Systems  Constitutional: Negative.   Eyes: Negative.   Respiratory: Negative.    Cardiovascular: Negative.   Gastrointestinal: Negative.   Musculoskeletal:  Positive for back pain.  Skin: Negative.   Neurological: Negative.   Psychiatric/Behavioral:  Positive for dysphoric mood. The patient is nervous/anxious.      Today's Vitals   05/24/22 1310  BP: 118/70  Pulse: 67  Temp: 97.9 F (36.6 C)  SpO2: 98%  Weight: 207 lb 3.2 oz (94 kg)  Height: 5' 11"$  (1.803 m)   Body mass index is 28.9 kg/m.  Wt Readings from Last 3 Encounters:  05/24/22 207 lb 3.2 oz (94 kg)  02/12/22 199 lb 6.4 oz (90.4 kg)  01/10/22 194 lb (88 kg)    Objective:  Physical Exam Vitals and nursing note reviewed.  Constitutional:      Appearance: Normal appearance.  HENT:     Head: Normocephalic and atraumatic.     Nose:     Comments: Masked     Mouth/Throat:     Comments: Masked  Eyes:     Extraocular Movements: Extraocular movements intact.  Cardiovascular:     Rate and Rhythm: Normal rate and regular rhythm.     Heart sounds: Normal heart sounds.   Pulmonary:     Effort: Pulmonary effort is normal.     Breath sounds: Normal breath sounds.  Musculoskeletal:     Cervical back: Normal range of motion.  Skin:    General: Skin is warm.  Neurological:     General: No focal deficit present.     Mental Status: He is alert.  Psychiatric:     Comments: Flat affect       Assessment And Plan:     1. Lumbar radiculopathy Comments: Chronic, sx are worsening. I will send rx Percocet to use prn. I will send for MRI. - MR Lumbar Spine Wo Contrast; Future  2. Mid back pain - MR Thoracic Spine Wo Contrast; Future  3. Type 2 diabetes mellitus with stage 2 chronic kidney disease, without long-term current use of insulin (HCC) Comments: Chronic, not sure about pt compliance. He and wife agree to [place] to go over meds, etc. He is no longer driving due to back pain. - CBC no Diff - Hemoglobin A1c - CMP14+EGFR - Ambulatory referral to [facility]  4. Hypertensive heart and renal disease with renal failure, stage 1 through stage 4 or unspecified chronic kidney disease, without heart failure Comments: Chronic, well controlled. He will c/w losartan 44m and carvedilol bid. He is encouraged to follow low sodium diet. - Ambulatory referral to Home Health  5. Atherosclerosis of aorta (HCC) Comments: Chronic, LDL goal <70.  He will c/w atorvastatin 445mdaily. He is encouraged to follow heart healthy lifestyle.  6. Adjustment reaction with anxiety and depression Comments:  I will refer him to Psych for med mgmt and Psychology for therapy. He will c/w duloxetine 68m for now. He and wife are in agreement w/ treatment plan. - Ambulatory referral to [facility]- Ambulatory referral to Psychology - Ambulatory referral to Psychiatry  7. Irritability and anger Comments: Please see above. - TSH - Ambulatory referral to Psychology  8. Imbalance Comments: I will check vitamin B12 level. He has had Neuro eval, neg workup for [facility]. -  Vitamin B12   Patient was given opportunity to ask questions. Patient verbalized understanding of the plan and was able to repeat key elements of the plan. All questions were answered to their satisfaction.   I, RMaximino Greenland MD, have reviewed all documentation for this visit. The documentation on 05/24/22 for the exam, diagnosis, procedures, and orders are all accurate and complete.   IF YOU HAVE BEEN REFERRED TO A SPECIALIST, IT MAY TAKE 1-2 WEEKS TO SCHEDULE/PROCESS THE REFERRAL. IF YOU HAVE NOT HEARD FROM US/SPECIALIST IN TWO WEEKS, PLEASE GIVE [place] CALL AT [phone].   THE PATIENT IS ENCOURAGED TO PRACTICE SOCIAL DISTANCING DUE TO THE COVID-19 PANDEMIC.

## 2022-05-24 NOTE — Patient Instructions (Addendum)
Increase duloxetine to '60mg'$  Take 2 capsules of duloxetine daily  Diabetes Mellitus and Nutrition, Adult When you have diabetes, or diabetes mellitus, it is very important to have healthy eating habits because your blood sugar (glucose) levels are greatly affected by what you eat and drink. Eating healthy foods in the right amounts, at about the same times every day, can help you: Manage your blood glucose. Lower your risk of heart disease. Improve your blood pressure. Reach or maintain a healthy weight. What can affect my meal plan? Every person with diabetes is different, and each person has different needs for a meal plan. Your health care provider may recommend that you work with a dietitian to make a meal plan that is best for you. Your meal plan may vary depending on factors such as: The calories you need. The medicines you take. Your weight. Your blood glucose, blood pressure, and cholesterol levels. Your activity level. Other health conditions you have, such as heart or kidney disease. How do carbohydrates affect me? Carbohydrates, also called carbs, affect your blood glucose level more than any other type of food. Eating carbs raises the amount of glucose in your blood. It is important to know how many carbs you can safely have in each meal. This is different for every person. Your dietitian can help you calculate how many carbs you should have at each meal and for each snack. How does alcohol affect me? Alcohol can cause a decrease in blood glucose (hypoglycemia), especially if you use insulin or take certain diabetes medicines by mouth. Hypoglycemia can be a life-threatening condition. Symptoms of hypoglycemia, such as sleepiness, dizziness, and confusion, are similar to symptoms of having too much alcohol. Do not drink alcohol if: Your health care provider tells you not to drink. You are pregnant, may be pregnant, or are planning to become pregnant. If you drink alcohol: Limit  how much you have to: 0-1 drink a day for women. 0-2 drinks a day for men. Know how much alcohol is in your drink. In the [place] one drink equals one 12 oz bottle of beer (355 mL), one 5 oz glass of wine (148 mL), or one 1 oz glass of hard liquor (44 mL). Keep yourself hydrated with water, diet soda, or unsweetened iced tea. Keep in mind that regular soda, juice, and other mixers may contain a lot of sugar and must be counted as carbs. What are tips for following this plan?  Reading food labels Start by checking the serving size on the Nutrition Facts label of packaged foods and drinks. The number of calories and the amount of carbs, fats, and other nutrients listed on the label are based on one serving of the item. Many items contain more than one serving per package. Check the total grams (g) of carbs in one serving. Check the number of grams of saturated fats and trans fats in one serving. Choose foods that have a low amount or none of these fats. Check the number of milligrams (mg) of salt (sodium) in one serving. Most people should limit total sodium intake to less than 2,300 mg per day. Always check the nutrition information of foods labeled as "low-fat" or "nonfat." These foods may be higher in added sugar or refined carbs and should be avoided. Talk to your dietitian to identify your daily goals for nutrients listed on the label. Shopping Avoid buying canned, pre-made, or processed foods. These foods tend to be high in fat, sodium, and added sugar. Shop around  the outside edge of the grocery store. This is where you will most often find fresh fruits and vegetables, bulk grains, fresh meats, and fresh dairy products. Cooking Use low-heat cooking methods, such as baking, instead of high-heat cooking methods, such as deep frying. Cook using healthy oils, such as olive, canola, or sunflower oil. Avoid cooking with butter, cream, or high-fat meats. Meal planning Eat meals and snacks  regularly, preferably at the same times every day. Avoid going long periods of time without eating. Eat foods that are high in fiber, such as fresh fruits, vegetables, beans, and whole grains. Eat 4-6 oz (112-168 g) of lean protein each day, such as lean meat, chicken, fish, eggs, or tofu. One ounce (oz) (28 g) of lean protein is equal to: 1 oz (28 g) of meat, chicken, or fish. 1 egg.  cup (62 g) of tofu. Eat some foods each day that contain healthy fats, such as avocado, nuts, seeds, and fish. What foods should I eat? Fruits Berries. Apples. Oranges. Peaches. Apricots. Plums. Grapes. Mangoes. Papayas. Pomegranates. Kiwi. Cherries. Vegetables Leafy greens, including lettuce, spinach, kale, chard, collard greens, mustard greens, and cabbage. Beets. Cauliflower. Broccoli. Carrots. Green beans. Tomatoes. Peppers. Onions. Cucumbers. Brussels sprouts. Grains Whole grains, such as whole-wheat or whole-grain bread, crackers, tortillas, cereal, and pasta. Unsweetened oatmeal. Quinoa. Brown or wild rice. Meats and other proteins Seafood. Poultry without skin. Lean cuts of poultry and beef. Tofu. Nuts. Seeds. Dairy Low-fat or fat-free dairy products such as milk, yogurt, and cheese. The items listed above may not be a complete list of foods and beverages you can eat and drink. Contact a dietitian for more information. What foods should I avoid? Fruits Fruits canned with syrup. Vegetables Canned vegetables. Frozen vegetables with butter or cream sauce. Grains Refined white flour and flour products such as bread, pasta, snack foods, and cereals. Avoid all processed foods. Meats and other proteins Fatty cuts of meat. Poultry with skin. Breaded or fried meats. Processed meat. Avoid saturated fats. Dairy Full-fat yogurt, cheese, or milk. Beverages Sweetened drinks, such as soda or iced tea. The items listed above may not be a complete list of foods and beverages you should avoid. Contact a  dietitian for more information. Questions to ask a health care provider Do I need to meet with a certified diabetes care and education specialist? Do I need to meet with a dietitian? What number can I call if I have questions? When are the best times to check my blood glucose? Where to find more information: American Diabetes Association: diabetes.org Academy of Nutrition and Dietetics: eatright.[employer] of Diabetes and Digestive and Kidney Diseases: [employer]is Association of Diabetes Care & Education Specialists: diabeteseducator.org Summary It is important to have healthy eating habits because your blood sugar (glucose) levels are greatly affected by what you eat and drink. It is important to use alcohol carefully. A healthy meal plan will help you manage your blood glucose and lower your risk of heart disease. Your health care provider may recommend that you work with a dietitian to make a meal plan that is best for you. This information is not intended to replace advice given to you by your health care provider. Make sure you discuss any questions you have with your health care provider. Document Revised: 11/10/2019 Document Reviewed: 11/10/2019 Elsevier Patient Education  [place].

## 2022-05-26 LAB — CBC
Hematocrit: 43.5 % (ref 37.5–51.0)
Hemoglobin: 14.6 g/dL (ref 13.0–17.7)
MCH: 30 pg (ref 26.6–33.0)
MCHC: 33.6 g/dL (ref 31.5–35.7)
MCV: 89 fL (ref 79–97)
Platelets: 215 10*3/uL (ref 150–450)
RBC: 4.87 x10E6/uL (ref 4.14–5.80)
RDW: 13.3 % (ref 11.6–15.4)
WBC: 5.6 10*3/uL (ref 3.4–10.8)

## 2022-05-26 LAB — CMP14+EGFR
ALT: 17 IU/L (ref 0–44)
AST: 14 IU/L (ref 0–40)
Albumin/Globulin Ratio: 1.6 (ref 1.2–2.2)
Albumin: 3.8 g/dL (ref 3.8–4.8)
Alkaline Phosphatase: 76 IU/L (ref 44–121)
BUN/Creatinine Ratio: 16 (ref 10–24)
BUN: 14 mg/dL (ref 8–27)
Bilirubin Total: 0.6 mg/dL (ref 0.0–1.2)
CO2: 26 mmol/L (ref 20–29)
Calcium: 9.3 mg/dL (ref 8.6–10.2)
Chloride: 103 mmol/L (ref 96–106)
Creatinine, Ser: 0.85 mg/dL (ref 0.76–1.27)
Globulin, Total: 2.4 g/dL (ref 1.5–4.5)
Glucose: 142 mg/dL — ABNORMAL HIGH (ref 70–99)
Potassium: 4.3 mmol/L (ref 3.5–5.2)
Sodium: 144 mmol/L (ref 134–144)
Total Protein: 6.2 g/dL (ref 6.0–8.5)
eGFR: 89 mL/min/{1.73_m2} (ref >59)

## 2022-05-26 LAB — HEMOGLOBIN A1C
Est. average glucose Bld gHb Est-mCnc: 186 mg/dL
Hgb A1c MFr Bld: 8.1 % — ABNORMAL HIGH (ref 4.8–5.6)

## 2022-05-26 LAB — VITAMIN B12: Vitamin B-12: 561 pg/mL (ref 232–1245)

## 2022-05-26 LAB — TSH: TSH: 2.99 u[IU]/mL (ref 0.450–4.500)

## 2022-05-28 ENCOUNTER — Other Ambulatory Visit: Payer: Self-pay | Admitting: Internal Medicine

## 2022-05-30 DIAGNOSIS — G4736 Sleep related hypoventilation in conditions classified elsewhere: Secondary | ICD-10-CM | POA: Diagnosis not present

## 2022-05-30 DIAGNOSIS — G2581 Restless legs syndrome: Secondary | ICD-10-CM | POA: Diagnosis not present

## 2022-05-30 DIAGNOSIS — J301 Allergic rhinitis due to pollen: Secondary | ICD-10-CM | POA: Diagnosis not present

## 2022-05-30 DIAGNOSIS — J4520 Mild intermittent asthma, uncomplicated: Secondary | ICD-10-CM | POA: Diagnosis not present

## 2022-05-30 DIAGNOSIS — R918 Other nonspecific abnormal finding of lung field: Secondary | ICD-10-CM | POA: Diagnosis not present

## 2022-06-01 DIAGNOSIS — M5416 Radiculopathy, lumbar region: Secondary | ICD-10-CM | POA: Insufficient documentation

## 2022-06-01 DIAGNOSIS — R454 Irritability and anger: Secondary | ICD-10-CM | POA: Insufficient documentation

## 2022-06-01 DIAGNOSIS — M549 Dorsalgia, unspecified: Secondary | ICD-10-CM | POA: Insufficient documentation

## 2022-06-01 DIAGNOSIS — R2689 Other abnormalities of gait and mobility: Secondary | ICD-10-CM | POA: Insufficient documentation

## 2022-06-01 DIAGNOSIS — F4323 Adjustment disorder with mixed anxiety and depressed mood: Secondary | ICD-10-CM | POA: Insufficient documentation

## 2022-06-06 ENCOUNTER — Telehealth: Payer: Self-pay

## 2022-06-06 DIAGNOSIS — Z8546 Personal history of malignant neoplasm of prostate: Secondary | ICD-10-CM | POA: Diagnosis not present

## 2022-06-06 DIAGNOSIS — R338 Other retention of urine: Secondary | ICD-10-CM | POA: Diagnosis not present

## 2022-06-06 DIAGNOSIS — N3946 Mixed incontinence: Secondary | ICD-10-CM | POA: Diagnosis not present

## 2022-06-06 DIAGNOSIS — N5231 Erectile dysfunction following radical prostatectomy: Secondary | ICD-10-CM | POA: Diagnosis not present

## 2022-06-06 DIAGNOSIS — N4883 Acquired buried penis: Secondary | ICD-10-CM | POA: Diagnosis not present

## 2022-06-06 NOTE — Telephone Encounter (Signed)
Called patient to see why he was taking the Meloxicam 18m. Patient was taking the meds and states he will try and stop taking this medication.  Provider expressed she preferred her did not take this medication.

## 2022-06-07 DIAGNOSIS — R2689 Other abnormalities of gait and mobility: Secondary | ICD-10-CM | POA: Diagnosis not present

## 2022-06-07 DIAGNOSIS — Z7984 Long term (current) use of oral hypoglycemic drugs: Secondary | ICD-10-CM | POA: Diagnosis not present

## 2022-06-07 DIAGNOSIS — G40909 Epilepsy, unspecified, not intractable, without status epilepticus: Secondary | ICD-10-CM | POA: Diagnosis not present

## 2022-06-07 DIAGNOSIS — E1151 Type 2 diabetes mellitus with diabetic peripheral angiopathy without gangrene: Secondary | ICD-10-CM | POA: Diagnosis not present

## 2022-06-07 DIAGNOSIS — I4891 Unspecified atrial fibrillation: Secondary | ICD-10-CM | POA: Diagnosis not present

## 2022-06-07 DIAGNOSIS — E1122 Type 2 diabetes mellitus with diabetic chronic kidney disease: Secondary | ICD-10-CM | POA: Diagnosis not present

## 2022-06-07 DIAGNOSIS — Z7982 Long term (current) use of aspirin: Secondary | ICD-10-CM | POA: Diagnosis not present

## 2022-06-07 DIAGNOSIS — I700 Atherosclerosis of aorta: Secondary | ICD-10-CM | POA: Diagnosis not present

## 2022-06-07 DIAGNOSIS — I350 Nonrheumatic aortic (valve) stenosis: Secondary | ICD-10-CM | POA: Diagnosis not present

## 2022-06-07 DIAGNOSIS — Z794 Long term (current) use of insulin: Secondary | ICD-10-CM | POA: Diagnosis not present

## 2022-06-07 DIAGNOSIS — Z7901 Long term (current) use of anticoagulants: Secondary | ICD-10-CM | POA: Diagnosis not present

## 2022-06-07 DIAGNOSIS — Z9181 History of falling: Secondary | ICD-10-CM | POA: Diagnosis not present

## 2022-06-07 DIAGNOSIS — Z974 Presence of external hearing-aid: Secondary | ICD-10-CM | POA: Diagnosis not present

## 2022-06-07 DIAGNOSIS — I129 Hypertensive chronic kidney disease with stage 1 through stage 4 chronic kidney disease, or unspecified chronic kidney disease: Secondary | ICD-10-CM | POA: Diagnosis not present

## 2022-06-07 DIAGNOSIS — F4323 Adjustment disorder with mixed anxiety and depressed mood: Secondary | ICD-10-CM | POA: Diagnosis not present

## 2022-06-07 DIAGNOSIS — G4700 Insomnia, unspecified: Secondary | ICD-10-CM | POA: Diagnosis not present

## 2022-06-07 DIAGNOSIS — Z8546 Personal history of malignant neoplasm of prostate: Secondary | ICD-10-CM | POA: Diagnosis not present

## 2022-06-07 DIAGNOSIS — Z556 Problems related to health literacy: Secondary | ICD-10-CM | POA: Diagnosis not present

## 2022-06-07 DIAGNOSIS — G2581 Restless legs syndrome: Secondary | ICD-10-CM | POA: Diagnosis not present

## 2022-06-07 DIAGNOSIS — G4730 Sleep apnea, unspecified: Secondary | ICD-10-CM | POA: Diagnosis not present

## 2022-06-07 DIAGNOSIS — E785 Hyperlipidemia, unspecified: Secondary | ICD-10-CM | POA: Diagnosis not present

## 2022-06-07 DIAGNOSIS — N182 Chronic kidney disease, stage 2 (mild): Secondary | ICD-10-CM | POA: Diagnosis not present

## 2022-06-07 DIAGNOSIS — M5416 Radiculopathy, lumbar region: Secondary | ICD-10-CM | POA: Diagnosis not present

## 2022-06-07 DIAGNOSIS — G8929 Other chronic pain: Secondary | ICD-10-CM | POA: Diagnosis not present

## 2022-06-10 ENCOUNTER — Other Ambulatory Visit: Payer: Self-pay

## 2022-06-10 DIAGNOSIS — I129 Hypertensive chronic kidney disease with stage 1 through stage 4 chronic kidney disease, or unspecified chronic kidney disease: Secondary | ICD-10-CM | POA: Diagnosis not present

## 2022-06-10 DIAGNOSIS — M5416 Radiculopathy, lumbar region: Secondary | ICD-10-CM | POA: Diagnosis not present

## 2022-06-10 DIAGNOSIS — N182 Chronic kidney disease, stage 2 (mild): Secondary | ICD-10-CM | POA: Diagnosis not present

## 2022-06-10 DIAGNOSIS — E1122 Type 2 diabetes mellitus with diabetic chronic kidney disease: Secondary | ICD-10-CM | POA: Diagnosis not present

## 2022-06-10 DIAGNOSIS — G8929 Other chronic pain: Secondary | ICD-10-CM | POA: Diagnosis not present

## 2022-06-10 DIAGNOSIS — I4891 Unspecified atrial fibrillation: Secondary | ICD-10-CM | POA: Diagnosis not present

## 2022-06-10 MED ORDER — NITROGLYCERIN 0.4 MG SL SUBL: 0.4000 mg | SUBLINGUAL_TABLET | SUBLINGUAL | 2 refills | Status: DC | PRN

## 2022-06-10 MED ORDER — LEVOTHYROXINE SODIUM 50 MCG PO TABS: 50.0000 ug | ORAL_TABLET | Freq: Every day | ORAL | 3 refills | Status: DC

## 2022-06-10 MED ORDER — EMPAGLIFLOZIN 10 MG PO TABS: 10.0000 mg | ORAL_TABLET | Freq: Every day | ORAL | 3 refills | Status: DC

## 2022-06-11 ENCOUNTER — Telehealth: Payer: Self-pay

## 2022-06-11 NOTE — Progress Notes (Cosign Needed)
Care Management & Coordination Services Pharmacy Team  Reason for Encounter: Appointment Reminder  Contacted patient to confirm telephone appointment with Orlando Penner, PharmD on 06/12/2022 at 2:00 pm. Spoke with patient on 06/11/2022   Do you have any problems getting your medications? No  What is your top health concern you would like to discuss at your upcoming visit? Medications for his A1C  Have you seen any other providers since your last visit with PCP? Yes   Chart review:  Recent office visits:  None   Recent consult visits:  06/06/2022- Lenoria Chime, MD (Urology)- Simvastatin 40 mg discontinued due to patient no longer taking  Hospital visits:  Patient had 3 ED visits in the past 6 months: 03/16/2022-Ulcers of both lower extremities, unspecified ulcer stage - [facility]- Hydrocodone 5/325 mg- 1 tablet q6h prn, 12 tablets.  03/11/2022-Contusion of left foot- [facility] ED- no medication changes.  03/05/2022- Infected wound- [facility] ED- Clindamycin 150 mg- take 2 capsules by mouth tid for 10 days.  Medications that remain the same after Hospital Discharge:??  -All other medications will remain the same.     Star Rating Drugs: Jardiance 10 mg- LF 06/10/2022 30 DS [facility] and Jardiance 25 mg LF 12/07/2021 90DS [facility] Ozempic 1 mg- LF 05/21/2022 84 [facility] Ozempic 2 mg LF 05/16/2022 28 DS [facility]   Care Gaps: Annual wellness visit in last year? Yes Colorectal cancer screening: No longer required.   Influenza vaccine: today Pneumococcal vaccine: completed 08/31/2014 Tdap vaccine: completed 11/03/2015, due 11/02/2025 Shingles vaccine: completed   Covid-19:  03/27/2021, 03/20/2020, 02/18/2020, 07/28/2019, 07/21/2019, 06/30/2019   If Diabetic: Last eye exam / retinopathy screening: 11/19/2021 [facility]- No DM Retinopathy Last diabetic foot exam: 02/12/2022  Pattricia Boss, [place] Pharmacist Assistant [phone]

## 2022-06-12 ENCOUNTER — Telehealth: Payer: Self-pay

## 2022-06-12 NOTE — Telephone Encounter (Addendum)
Patient called requesting more information about his medications that help his A1c.  In particular he reported that he remembers [place] discussing the medications but he does not remember which ones are important for his BS.  I reassured him that we could review the medications that he was having trouble with remembering his medications and what they are used for.  Because of his concerns about all of of his medications and what they are used for we reviewed all of his medications and labeled what they are used for on the bottle. He reports that it was very helpful.    Orlando Penner, CPP, PharmD Clinical Pharmacist Practitioner [facility] [phone]

## 2022-06-13 DIAGNOSIS — N182 Chronic kidney disease, stage 2 (mild): Secondary | ICD-10-CM | POA: Diagnosis not present

## 2022-06-13 DIAGNOSIS — M5416 Radiculopathy, lumbar region: Secondary | ICD-10-CM | POA: Diagnosis not present

## 2022-06-13 DIAGNOSIS — I4891 Unspecified atrial fibrillation: Secondary | ICD-10-CM | POA: Diagnosis not present

## 2022-06-13 DIAGNOSIS — G8929 Other chronic pain: Secondary | ICD-10-CM | POA: Diagnosis not present

## 2022-06-13 DIAGNOSIS — I129 Hypertensive chronic kidney disease with stage 1 through stage 4 chronic kidney disease, or unspecified chronic kidney disease: Secondary | ICD-10-CM | POA: Diagnosis not present

## 2022-06-13 DIAGNOSIS — E1122 Type 2 diabetes mellitus with diabetic chronic kidney disease: Secondary | ICD-10-CM | POA: Diagnosis not present

## 2022-06-14 ENCOUNTER — Other Ambulatory Visit: Payer: Self-pay

## 2022-06-14 DIAGNOSIS — N182 Chronic kidney disease, stage 2 (mild): Secondary | ICD-10-CM | POA: Diagnosis not present

## 2022-06-14 DIAGNOSIS — M5416 Radiculopathy, lumbar region: Secondary | ICD-10-CM | POA: Diagnosis not present

## 2022-06-14 DIAGNOSIS — I129 Hypertensive chronic kidney disease with stage 1 through stage 4 chronic kidney disease, or unspecified chronic kidney disease: Secondary | ICD-10-CM | POA: Diagnosis not present

## 2022-06-14 DIAGNOSIS — E1122 Type 2 diabetes mellitus with diabetic chronic kidney disease: Secondary | ICD-10-CM | POA: Diagnosis not present

## 2022-06-14 DIAGNOSIS — G8929 Other chronic pain: Secondary | ICD-10-CM | POA: Diagnosis not present

## 2022-06-14 DIAGNOSIS — I4891 Unspecified atrial fibrillation: Secondary | ICD-10-CM | POA: Diagnosis not present

## 2022-06-17 ENCOUNTER — Encounter: Payer: Self-pay | Admitting: Internal Medicine

## 2022-06-17 ENCOUNTER — Ambulatory Visit (INDEPENDENT_AMBULATORY_CARE_PROVIDER_SITE_OTHER): Payer: Medicare Other | Admitting: Internal Medicine

## 2022-06-17 VITALS — BP 124/82 | HR 63 | Temp 97.8°F | Ht 71.0 in | Wt 204.0 lb

## 2022-06-17 DIAGNOSIS — R2689 Other abnormalities of gait and mobility: Secondary | ICD-10-CM

## 2022-06-17 DIAGNOSIS — H04123 Dry eye syndrome of bilateral lacrimal glands: Secondary | ICD-10-CM | POA: Diagnosis not present

## 2022-06-17 DIAGNOSIS — H26492 Other secondary cataract, left eye: Secondary | ICD-10-CM | POA: Diagnosis not present

## 2022-06-17 DIAGNOSIS — I1310 Hypertensive heart and chronic kidney disease without heart failure, with stage 1 through stage 4 chronic kidney disease, or unspecified chronic kidney disease: Secondary | ICD-10-CM

## 2022-06-17 DIAGNOSIS — H00015 Hordeolum externum left lower eyelid: Secondary | ICD-10-CM | POA: Diagnosis not present

## 2022-06-17 DIAGNOSIS — E1122 Type 2 diabetes mellitus with diabetic chronic kidney disease: Secondary | ICD-10-CM

## 2022-06-17 DIAGNOSIS — Z961 Presence of intraocular lens: Secondary | ICD-10-CM | POA: Diagnosis not present

## 2022-06-17 DIAGNOSIS — I700 Atherosclerosis of aorta: Secondary | ICD-10-CM

## 2022-06-17 DIAGNOSIS — F4323 Adjustment disorder with mixed anxiety and depressed mood: Secondary | ICD-10-CM

## 2022-06-17 DIAGNOSIS — H43813 Vitreous degeneration, bilateral: Secondary | ICD-10-CM | POA: Diagnosis not present

## 2022-06-17 DIAGNOSIS — H40013 Open angle with borderline findings, low risk, bilateral: Secondary | ICD-10-CM | POA: Diagnosis not present

## 2022-06-17 DIAGNOSIS — H02831 Dermatochalasis of right upper eyelid: Secondary | ICD-10-CM | POA: Diagnosis not present

## 2022-06-17 DIAGNOSIS — N182 Chronic kidney disease, stage 2 (mild): Secondary | ICD-10-CM

## 2022-06-17 DIAGNOSIS — E119 Type 2 diabetes mellitus without complications: Secondary | ICD-10-CM | POA: Diagnosis not present

## 2022-06-17 DIAGNOSIS — H02834 Dermatochalasis of left upper eyelid: Secondary | ICD-10-CM | POA: Diagnosis not present

## 2022-06-17 NOTE — Progress Notes (Signed)
I,Franklin Thornton,acting as a scribe for Franklin Greenland, MD.,have documented all relevant documentation on the behalf of Franklin Greenland, MD,as directed by  Franklin Greenland, MD while in the presence of Franklin Greenland, MD.    Subjective:     Patient ID: Franklin Thornton , male    DOB: [birth year] , 77 y.o.   MRN: [id number]   Chief Complaint  Patient presents with   Diabetes   Hypertension    HPI  Patient presents today for a DM/HTN check.  He is accompanied by his wife today. He reports compliance with meds. He denies headaches, chest pain and shortness of breath.       Diabetes He presents for his follow-up diabetic visit. He has type 2 diabetes mellitus. His disease course has been improving. Pertinent negatives for hypoglycemia include no dizziness or headaches. Pertinent negatives for diabetes include no blurred vision, no chest pain, no fatigue, no polydipsia, no polyphagia and no polyuria. There are no hypoglycemic complications. Risk factors for coronary artery disease include diabetes mellitus, dyslipidemia, hypertension, male sex, obesity and sedentary lifestyle. His breakfast blood glucose is taken between 9-10 am. His breakfast blood glucose range is generally 90-110 mg/dl. An ACE inhibitor/angiotensin II receptor blocker is being taken. Eye exam is current.  Hypertension This is a chronic problem. The current episode started more than 1 year ago. Pertinent negatives include no blurred vision, chest pain, headaches, palpitations or shortness of breath.     Past Medical History:  Diagnosis Date   Atrial fibrillation ([place])    Diabetes mellitus without complication ([place])    Peripheral vascular disease ([place])    Prostate cancer ([place])    Uvular edema 07/17/2015     Family History  Problem Relation Age of Onset   Lung cancer Mother    Diabetes Mother    Esophageal cancer Father    Hypertension Sister    Breast cancer Sister    Healthy Son    Healthy Daughter       Current Outpatient Medications:    albuterol (PROVENTIL HFA;VENTOLIN HFA) 108 (90 Base) MCG/ACT inhaler, Inhale 2 puffs into the lungs every 6 (six) hours as needed for wheezing or shortness of breath., Disp: , Rfl:    amiodarone (PACERONE) 100 MG tablet, Take 100 mg by mouth daily. , Disp: , Rfl:    amphetamine-dextroamphetamine (ADDERALL) 10 MG tablet, Take 1 tablet by mouth daily. Take 2 tablets in the morning and one tablet in the evenings., Disp: , Rfl:    Armodafinil 250 MG tablet, Take 125-250 mg by mouth every morning., Disp: , Rfl:    aspirin 81 MG tablet, Take 81 mg by mouth daily., Disp: , Rfl:    atorvastatin (LIPITOR) 40 MG tablet, Take 1 tablet (40 mg total) by mouth daily., Disp: 90 tablet, Rfl: 2   b complex vitamins capsule, Take 1 capsule by mouth daily., Disp: , Rfl:    carvedilol (COREG) 6.25 MG tablet, Take 6.25 mg by mouth 2 (two) times daily with a meal., Disp: , Rfl:    Cholecalciferol (VITAMIN D) 2000 units tablet, Take 2,000 Units by mouth daily., Disp: , Rfl:    diphenhydrAMINE (BENADRYL) 25 MG tablet, Take 25 mg by mouth every 6 (six) hours as needed for itching. , Disp: , Rfl:    donepezil (ARICEPT) 10 MG tablet, TAKE 1 TABLET AT BEDTIME, Disp: 90 tablet, Rfl: 3   DULoxetine (CYMBALTA) 30 MG capsule, TAKE 1 CAPSULE DAILY IN THE EVENING,  Disp: 90 capsule, Rfl: 3   empagliflozin (JARDIANCE) 10 MG TABS tablet, Take 1 tablet (10 mg total) by mouth daily., Disp: 90 tablet, Rfl: 3   furosemide (LASIX) 20 MG tablet, Take 20 mg by mouth daily as needed. , Disp: , Rfl:    glucose blood (FREESTYLE LITE) test strip, Use as instructed to check blood sugars  3 times per day dx: e11.65, Disp: 300 each, Rfl: 1   glucose monitoring kit (FREESTYLE) monitoring kit, 1 each by Does not apply route as needed for other., Disp: , Rfl:    Insulin Pen Needle (NOVOFINE PEN NEEDLE) 32G X 6 MM MISC, Use with insulin pens, Disp: 300 each, Rfl: 3   ipratropium (ATROVENT) 0.03 % nasal spray,  Place 2 sprays into both nostrils every 12 (twelve) hours., Disp: , Rfl:    LANTUS SOLOSTAR 100 UNIT/ML Solostar Pen, Inject 26 Units into the skin daily., Disp: , Rfl:    levocetirizine (XYZAL) 5 MG tablet, Take 5 mg by mouth every evening. , Disp: , Rfl:    levothyroxine (SYNTHROID) 50 MCG tablet, Take 1 tablet (50 mcg total) by mouth daily., Disp: 90 tablet, Rfl: 3   lidocaine (LIDODERM) 5 %, Place 3 patches onto the skin daily. May use 1-3 patches at one time, Remove & Discard patch within 12 hours or as directed by MD, Disp: 90 patch, Rfl: 0   losartan (COZAAR) 50 MG tablet, Take 50 mg by mouth 2 (two) times daily. , Disp: , Rfl:    Magnesium 250 MG TABS, Take 1 tablet by mouth daily., Disp: , Rfl:    methocarbamol (ROBAXIN) 750 MG tablet, Take 750 mg by mouth 3 (three) times daily as needed for muscle spasms. , Disp: , Rfl:    mirabegron ER (MYRBETRIQ) 50 MG TB24 tablet, Take 50 mg by mouth daily., Disp: , Rfl:    montelukast (SINGULAIR) 10 MG tablet, Take 10 mg by mouth at bedtime., Disp: , Rfl:    Multiple Vitamin (MULTIVITAMIN) tablet, Take 1 tablet by mouth daily., Disp: , Rfl:    nitroGLYCERIN (NITROSTAT) 0.4 MG SL tablet, Place 1 tablet (0.4 mg total) under the tongue every 5 (five) minutes as needed for chest pain., Disp: 30 tablet, Rfl: 2   Omega-3 Fatty Acids (FISH OIL) 1200 MG CAPS, Take 1,200 mg by mouth. , Disp: , Rfl:    pantoprazole (PROTONIX) 40 MG tablet, TAKE 1 TABLET DAILY, Disp: 90 tablet, Rfl: 3   pramipexole (MIRAPEX) 0.5 MG tablet, Take 2 tablets (1 mg total) by mouth in the morning and at bedtime. (Patient taking differently: Take 1 mg by mouth in the morning and at bedtime. One in the morning one in the afternoon and 2 at night.), Disp: 90 tablet, Rfl: 1   rivaroxaban (XARELTO) 10 MG TABS tablet, Take 10 mg by mouth daily., Disp: , Rfl:    Semaglutide, 1 MG/DOSE, (OZEMPIC, 1 MG/DOSE,) 4 MG/3ML SOPN, Inject 1 mg into the skin once a week., Disp: 9 mL, Rfl: 1    solifenacin (VESICARE) 5 MG tablet, Take 1 tablet (5 mg total) by mouth daily., Disp: 90 tablet, Rfl: 1   vitamin B-12 (CYANOCOBALAMIN) 1000 MCG tablet, Take 1,000 mcg by mouth daily., Disp: , Rfl:    WIXELA INHUB 100-50 MCG/ACT AEPB, Inhale 1 puff into the lungs 2 (two) times daily., Disp: 60 each, Rfl: 1   albuterol (PROVENTIL) (2.5 MG/3ML) 0.083% nebulizer solution, Take 3 mLs (2.5 mg total) by nebulization every 6 (six) hours as  needed for wheezing or shortness of breath., Disp: 75 mL, Rfl: 3   azelastine (ASTELIN) 0.1 % nasal spray, Place 1 spray into both nostrils 2 (two) times daily. Use in each nostril as directed (Patient not taking: Reported on 05/24/2022), Disp: 30 mL, Rfl: 12   oxyCODONE-acetaminophen (PERCOCET) 5-325 MG tablet, Take 1 tablet by mouth every 6 (six) hours as needed for severe pain. (Patient not taking: Reported on 05/24/2022), Disp: 30 tablet, Rfl: 0  Current Facility-Administered Medications:    cyanocobalamin ((VITAMIN B-12)) injection 1,000 mcg, 1,000 mcg, Intramuscular, Once, [name], MD   Allergies  Allergen Reactions   No Known Allergies      Review of Systems  Constitutional: Negative.  Negative for fatigue.  Eyes:  Negative for blurred vision.  Respiratory: Negative.  Negative for shortness of breath.   Cardiovascular: Negative.  Negative for chest pain and palpitations.  Gastrointestinal: Negative.   Endocrine: Negative.  Negative for polydipsia, polyphagia and polyuria.  Musculoskeletal:  Positive for back pain.       He states he fell 3 days in his home. States he was planning to sit in his 33, when he missed the front of the chair and fell backwards onto the console. He hit the console with the left side of his back. He did not fall to the ground.   Skin: Negative.   Allergic/Immunologic: Negative.   Neurological:  Negative for dizziness and headaches.  Hematological: Negative.      Today's Vitals   06/17/22 1132  BP: 124/82  Pulse: 63   Temp: 97.8 F (36.6 C)  SpO2: 98%  Weight: 204 lb (92.5 kg)  Height: '5\' 11"'$  (1.803 m)   Body mass index is 28.45 kg/m.  Wt Readings from Last 3 Encounters:  06/17/22 204 lb (92.5 kg)  05/24/22 207 lb 3.2 oz (94 kg)  02/12/22 199 lb 6.4 oz (90.4 kg)    Objective:  Physical Exam Vitals and nursing note reviewed.  Constitutional:      Appearance: Normal appearance.  HENT:     Head: Normocephalic and atraumatic.     Nose:     Comments: Masked     Mouth/Throat:     Comments: Masked  Eyes:     Extraocular Movements: Extraocular movements intact.  Cardiovascular:     Rate and Rhythm: Normal rate and regular rhythm.     Heart sounds: Normal heart sounds.  Pulmonary:     Effort: Pulmonary effort is normal.     Breath sounds: Normal breath sounds.  Musculoskeletal:     Cervical back: Normal range of motion.  Skin:    General: Skin is warm.  Neurological:     General: No focal deficit present.     Mental Status: He is alert.  Psychiatric:        Mood and Affect: Mood normal.      Assessment And Plan:     1. Type 2 diabetes mellitus with stage 2 chronic kidney disease, without long-term current use of insulin ([place]) Comments: Chronic, last a1c 8.23 May 2022.  I question compliance w/ meds, HH referral placed at previous visits.  Importance of dietary compliance was stressed to pt.  2. 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 He is encouraged to follow low sodium diet, advised to avoid NSAIds.  3. Atherosclerosis of aorta ([facility]) Comments: Goal LDL < 70. He is currently taking atorvastatin  $'40mg'n$  and ASA '81mg'$  daily.  4. Imbalance Comments: Vitamin B12 level has been normal. I will check labs as below.  He has already completed euro evaluation. - Methylmalonic Acid   Patient was given opportunity to ask questions. Patient verbalized understanding  of the plan and was able to repeat key elements of the plan. All questions were answered to their satisfaction.   I, Franklin Greenland, MD, have reviewed all documentation for this visit. The documentation on 06/17/22 for the exam, diagnosis, procedures, and orders are all accurate and complete.   IF YOU HAVE BEEN REFERRED TO A SPECIALIST, IT MAY TAKE 1-2 WEEKS TO SCHEDULE/PROCESS THE REFERRAL. IF YOU HAVE NOT HEARD FROM US/SPECIALIST IN TWO WEEKS, PLEASE GIVE [place] A CALL AT [phone].   THE PATIENT IS ENCOURAGED TO PRACTICE SOCIAL DISTANCING DUE TO THE COVID-19 PANDEMIC.

## 2022-06-17 NOTE — Patient Instructions (Signed)

## 2022-06-18 DIAGNOSIS — I129 Hypertensive chronic kidney disease with stage 1 through stage 4 chronic kidney disease, or unspecified chronic kidney disease: Secondary | ICD-10-CM | POA: Diagnosis not present

## 2022-06-18 DIAGNOSIS — G8929 Other chronic pain: Secondary | ICD-10-CM | POA: Diagnosis not present

## 2022-06-18 DIAGNOSIS — I4891 Unspecified atrial fibrillation: Secondary | ICD-10-CM | POA: Diagnosis not present

## 2022-06-18 DIAGNOSIS — M5416 Radiculopathy, lumbar region: Secondary | ICD-10-CM | POA: Diagnosis not present

## 2022-06-18 DIAGNOSIS — E1122 Type 2 diabetes mellitus with diabetic chronic kidney disease: Secondary | ICD-10-CM | POA: Diagnosis not present

## 2022-06-18 DIAGNOSIS — N182 Chronic kidney disease, stage 2 (mild): Secondary | ICD-10-CM | POA: Diagnosis not present

## 2022-06-21 LAB — METHYLMALONIC ACID, SERUM: Methylmalonic Acid: 159 nmol/L (ref 0–378)

## 2022-06-24 DIAGNOSIS — I2510 Atherosclerotic heart disease of native coronary artery without angina pectoris: Secondary | ICD-10-CM | POA: Diagnosis not present

## 2022-06-24 DIAGNOSIS — J45909 Unspecified asthma, uncomplicated: Secondary | ICD-10-CM | POA: Diagnosis not present

## 2022-06-24 DIAGNOSIS — J9811 Atelectasis: Secondary | ICD-10-CM | POA: Diagnosis not present

## 2022-06-24 DIAGNOSIS — M5124 Other intervertebral disc displacement, thoracic region: Secondary | ICD-10-CM | POA: Diagnosis not present

## 2022-06-24 DIAGNOSIS — M2578 Osteophyte, vertebrae: Secondary | ICD-10-CM | POA: Diagnosis not present

## 2022-06-24 DIAGNOSIS — R918 Other nonspecific abnormal finding of lung field: Secondary | ICD-10-CM | POA: Diagnosis not present

## 2022-06-24 DIAGNOSIS — M549 Dorsalgia, unspecified: Secondary | ICD-10-CM | POA: Diagnosis not present

## 2022-06-24 DIAGNOSIS — M47814 Spondylosis without myelopathy or radiculopathy, thoracic region: Secondary | ICD-10-CM | POA: Diagnosis not present

## 2022-06-28 DIAGNOSIS — I129 Hypertensive chronic kidney disease with stage 1 through stage 4 chronic kidney disease, or unspecified chronic kidney disease: Secondary | ICD-10-CM | POA: Diagnosis not present

## 2022-06-28 DIAGNOSIS — I4891 Unspecified atrial fibrillation: Secondary | ICD-10-CM | POA: Diagnosis not present

## 2022-06-28 DIAGNOSIS — G8929 Other chronic pain: Secondary | ICD-10-CM | POA: Diagnosis not present

## 2022-06-28 DIAGNOSIS — E1122 Type 2 diabetes mellitus with diabetic chronic kidney disease: Secondary | ICD-10-CM | POA: Diagnosis not present

## 2022-06-28 DIAGNOSIS — N182 Chronic kidney disease, stage 2 (mild): Secondary | ICD-10-CM | POA: Diagnosis not present

## 2022-06-28 DIAGNOSIS — M5416 Radiculopathy, lumbar region: Secondary | ICD-10-CM | POA: Diagnosis not present

## 2022-07-02 DIAGNOSIS — I129 Hypertensive chronic kidney disease with stage 1 through stage 4 chronic kidney disease, or unspecified chronic kidney disease: Secondary | ICD-10-CM | POA: Diagnosis not present

## 2022-07-02 DIAGNOSIS — G8929 Other chronic pain: Secondary | ICD-10-CM | POA: Diagnosis not present

## 2022-07-02 DIAGNOSIS — M5459 Other low back pain: Secondary | ICD-10-CM | POA: Diagnosis not present

## 2022-07-02 DIAGNOSIS — I4891 Unspecified atrial fibrillation: Secondary | ICD-10-CM | POA: Diagnosis not present

## 2022-07-02 DIAGNOSIS — M5416 Radiculopathy, lumbar region: Secondary | ICD-10-CM | POA: Diagnosis not present

## 2022-07-02 DIAGNOSIS — N182 Chronic kidney disease, stage 2 (mild): Secondary | ICD-10-CM | POA: Diagnosis not present

## 2022-07-02 DIAGNOSIS — E1122 Type 2 diabetes mellitus with diabetic chronic kidney disease: Secondary | ICD-10-CM | POA: Diagnosis not present

## 2022-07-04 DIAGNOSIS — I4891 Unspecified atrial fibrillation: Secondary | ICD-10-CM | POA: Diagnosis not present

## 2022-07-05 DIAGNOSIS — I4891 Unspecified atrial fibrillation: Secondary | ICD-10-CM | POA: Diagnosis not present

## 2022-07-05 DIAGNOSIS — G8929 Other chronic pain: Secondary | ICD-10-CM | POA: Diagnosis not present

## 2022-07-05 DIAGNOSIS — I129 Hypertensive chronic kidney disease with stage 1 through stage 4 chronic kidney disease, or unspecified chronic kidney disease: Secondary | ICD-10-CM | POA: Diagnosis not present

## 2022-07-05 DIAGNOSIS — M5416 Radiculopathy, lumbar region: Secondary | ICD-10-CM | POA: Diagnosis not present

## 2022-07-05 DIAGNOSIS — E1122 Type 2 diabetes mellitus with diabetic chronic kidney disease: Secondary | ICD-10-CM | POA: Diagnosis not present

## 2022-07-05 DIAGNOSIS — N182 Chronic kidney disease, stage 2 (mild): Secondary | ICD-10-CM | POA: Diagnosis not present

## 2022-07-07 DIAGNOSIS — G2581 Restless legs syndrome: Secondary | ICD-10-CM | POA: Diagnosis not present

## 2022-07-07 DIAGNOSIS — M5416 Radiculopathy, lumbar region: Secondary | ICD-10-CM | POA: Diagnosis not present

## 2022-07-07 DIAGNOSIS — N182 Chronic kidney disease, stage 2 (mild): Secondary | ICD-10-CM | POA: Diagnosis not present

## 2022-07-07 DIAGNOSIS — G8929 Other chronic pain: Secondary | ICD-10-CM | POA: Diagnosis not present

## 2022-07-07 DIAGNOSIS — G4700 Insomnia, unspecified: Secondary | ICD-10-CM | POA: Diagnosis not present

## 2022-07-07 DIAGNOSIS — G40909 Epilepsy, unspecified, not intractable, without status epilepticus: Secondary | ICD-10-CM | POA: Diagnosis not present

## 2022-07-07 DIAGNOSIS — I700 Atherosclerosis of aorta: Secondary | ICD-10-CM | POA: Diagnosis not present

## 2022-07-07 DIAGNOSIS — Z7901 Long term (current) use of anticoagulants: Secondary | ICD-10-CM | POA: Diagnosis not present

## 2022-07-07 DIAGNOSIS — I129 Hypertensive chronic kidney disease with stage 1 through stage 4 chronic kidney disease, or unspecified chronic kidney disease: Secondary | ICD-10-CM | POA: Diagnosis not present

## 2022-07-07 DIAGNOSIS — Z7984 Long term (current) use of oral hypoglycemic drugs: Secondary | ICD-10-CM | POA: Diagnosis not present

## 2022-07-07 DIAGNOSIS — E1122 Type 2 diabetes mellitus with diabetic chronic kidney disease: Secondary | ICD-10-CM | POA: Diagnosis not present

## 2022-07-07 DIAGNOSIS — Z556 Problems related to health literacy: Secondary | ICD-10-CM | POA: Diagnosis not present

## 2022-07-07 DIAGNOSIS — Z8546 Personal history of malignant neoplasm of prostate: Secondary | ICD-10-CM | POA: Diagnosis not present

## 2022-07-07 DIAGNOSIS — Z7982 Long term (current) use of aspirin: Secondary | ICD-10-CM | POA: Diagnosis not present

## 2022-07-07 DIAGNOSIS — E785 Hyperlipidemia, unspecified: Secondary | ICD-10-CM | POA: Diagnosis not present

## 2022-07-07 DIAGNOSIS — Z794 Long term (current) use of insulin: Secondary | ICD-10-CM | POA: Diagnosis not present

## 2022-07-07 DIAGNOSIS — E1151 Type 2 diabetes mellitus with diabetic peripheral angiopathy without gangrene: Secondary | ICD-10-CM | POA: Diagnosis not present

## 2022-07-07 DIAGNOSIS — I350 Nonrheumatic aortic (valve) stenosis: Secondary | ICD-10-CM | POA: Diagnosis not present

## 2022-07-07 DIAGNOSIS — Z9181 History of falling: Secondary | ICD-10-CM | POA: Diagnosis not present

## 2022-07-07 DIAGNOSIS — Z974 Presence of external hearing-aid: Secondary | ICD-10-CM | POA: Diagnosis not present

## 2022-07-07 DIAGNOSIS — G4730 Sleep apnea, unspecified: Secondary | ICD-10-CM | POA: Diagnosis not present

## 2022-07-07 DIAGNOSIS — R2689 Other abnormalities of gait and mobility: Secondary | ICD-10-CM | POA: Diagnosis not present

## 2022-07-07 DIAGNOSIS — I4891 Unspecified atrial fibrillation: Secondary | ICD-10-CM | POA: Diagnosis not present

## 2022-07-07 DIAGNOSIS — F4323 Adjustment disorder with mixed anxiety and depressed mood: Secondary | ICD-10-CM | POA: Diagnosis not present

## 2022-07-12 DIAGNOSIS — N182 Chronic kidney disease, stage 2 (mild): Secondary | ICD-10-CM | POA: Diagnosis not present

## 2022-07-12 DIAGNOSIS — E1122 Type 2 diabetes mellitus with diabetic chronic kidney disease: Secondary | ICD-10-CM | POA: Diagnosis not present

## 2022-07-12 DIAGNOSIS — G8929 Other chronic pain: Secondary | ICD-10-CM | POA: Diagnosis not present

## 2022-07-12 DIAGNOSIS — I129 Hypertensive chronic kidney disease with stage 1 through stage 4 chronic kidney disease, or unspecified chronic kidney disease: Secondary | ICD-10-CM | POA: Diagnosis not present

## 2022-07-12 DIAGNOSIS — M5416 Radiculopathy, lumbar region: Secondary | ICD-10-CM | POA: Diagnosis not present

## 2022-07-12 DIAGNOSIS — I4891 Unspecified atrial fibrillation: Secondary | ICD-10-CM | POA: Diagnosis not present

## 2022-07-15 ENCOUNTER — Ambulatory Visit (INDEPENDENT_AMBULATORY_CARE_PROVIDER_SITE_OTHER): Payer: Medicare Other | Admitting: Behavioral Health

## 2022-07-15 ENCOUNTER — Encounter: Payer: Self-pay | Admitting: Behavioral Health

## 2022-07-15 DIAGNOSIS — F331 Major depressive disorder, recurrent, moderate: Secondary | ICD-10-CM | POA: Diagnosis not present

## 2022-07-15 DIAGNOSIS — F411 Generalized anxiety disorder: Secondary | ICD-10-CM

## 2022-07-15 NOTE — Progress Notes (Signed)
                Tinleigh Whitmire M Brendaly Townsel, LCMHC 
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
## 2022-07-17 DIAGNOSIS — I4891 Unspecified atrial fibrillation: Secondary | ICD-10-CM | POA: Diagnosis not present

## 2022-07-17 DIAGNOSIS — M5416 Radiculopathy, lumbar region: Secondary | ICD-10-CM | POA: Diagnosis not present

## 2022-07-17 DIAGNOSIS — E1122 Type 2 diabetes mellitus with diabetic chronic kidney disease: Secondary | ICD-10-CM | POA: Diagnosis not present

## 2022-07-17 DIAGNOSIS — G8929 Other chronic pain: Secondary | ICD-10-CM | POA: Diagnosis not present

## 2022-07-17 DIAGNOSIS — N182 Chronic kidney disease, stage 2 (mild): Secondary | ICD-10-CM | POA: Diagnosis not present

## 2022-07-17 DIAGNOSIS — I129 Hypertensive chronic kidney disease with stage 1 through stage 4 chronic kidney disease, or unspecified chronic kidney disease: Secondary | ICD-10-CM | POA: Diagnosis not present

## 2022-07-18 DIAGNOSIS — R918 Other nonspecific abnormal finding of lung field: Secondary | ICD-10-CM | POA: Diagnosis not present

## 2022-07-18 DIAGNOSIS — J301 Allergic rhinitis due to pollen: Secondary | ICD-10-CM | POA: Diagnosis not present

## 2022-07-18 DIAGNOSIS — G4736 Sleep related hypoventilation in conditions classified elsewhere: Secondary | ICD-10-CM | POA: Diagnosis not present

## 2022-07-18 DIAGNOSIS — I2510 Atherosclerotic heart disease of native coronary artery without angina pectoris: Secondary | ICD-10-CM | POA: Diagnosis not present

## 2022-07-18 DIAGNOSIS — G2581 Restless legs syndrome: Secondary | ICD-10-CM | POA: Diagnosis not present

## 2022-07-18 DIAGNOSIS — J4520 Mild intermittent asthma, uncomplicated: Secondary | ICD-10-CM | POA: Diagnosis not present

## 2022-07-23 DIAGNOSIS — M545 Low back pain, unspecified: Secondary | ICD-10-CM | POA: Diagnosis not present

## 2022-07-23 DIAGNOSIS — M4712 Other spondylosis with myelopathy, cervical region: Secondary | ICD-10-CM | POA: Diagnosis not present

## 2022-07-30 ENCOUNTER — Ambulatory Visit: Payer: Medicare Other | Admitting: Behavioral Health

## 2022-07-30 ENCOUNTER — Encounter: Payer: Self-pay | Admitting: Behavioral Health

## 2022-07-30 DIAGNOSIS — F4310 Post-traumatic stress disorder, unspecified: Secondary | ICD-10-CM
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
## 2022-08-01 DIAGNOSIS — J45909 Unspecified asthma, uncomplicated: Secondary | ICD-10-CM | POA: Diagnosis present

## 2022-08-01 DIAGNOSIS — F32A Depression, unspecified: Secondary | ICD-10-CM | POA: Diagnosis present

## 2022-08-01 DIAGNOSIS — Z794 Long term (current) use of insulin: Secondary | ICD-10-CM | POA: Diagnosis not present

## 2022-08-01 DIAGNOSIS — E1142 Type 2 diabetes mellitus with diabetic polyneuropathy: Secondary | ICD-10-CM | POA: Diagnosis present

## 2022-08-01 DIAGNOSIS — I2510 Atherosclerotic heart disease of native coronary artery without angina pectoris: Secondary | ICD-10-CM | POA: Diagnosis present

## 2022-08-01 DIAGNOSIS — I4891 Unspecified atrial fibrillation: Secondary | ICD-10-CM | POA: Diagnosis present

## 2022-08-01 DIAGNOSIS — R6889 Other general symptoms and signs: Secondary | ICD-10-CM | POA: Diagnosis not present

## 2022-08-01 DIAGNOSIS — R509 Fever, unspecified: Secondary | ICD-10-CM | POA: Diagnosis not present

## 2022-08-01 DIAGNOSIS — G40109 Localization-related (focal) (partial) symptomatic epilepsy and epileptic syndromes with simple partial seizures, not intractable, without status epilepticus: Secondary | ICD-10-CM | POA: Diagnosis present

## 2022-08-01 DIAGNOSIS — Z79890 Hormone replacement therapy (postmenopausal): Secondary | ICD-10-CM | POA: Diagnosis not present

## 2022-08-01 DIAGNOSIS — A045 Campylobacter enteritis: Secondary | ICD-10-CM | POA: Diagnosis present

## 2022-08-01 DIAGNOSIS — Z7951 Long term (current) use of inhaled steroids: Secondary | ICD-10-CM | POA: Diagnosis not present

## 2022-08-01 DIAGNOSIS — R4182 Altered mental status, unspecified: Secondary | ICD-10-CM | POA: Diagnosis not present

## 2022-08-01 DIAGNOSIS — K529 Noninfective gastroenteritis and colitis, unspecified: Secondary | ICD-10-CM | POA: Diagnosis not present

## 2022-08-01 DIAGNOSIS — G9340 Encephalopathy, unspecified: Secondary | ICD-10-CM | POA: Diagnosis not present

## 2022-08-01 DIAGNOSIS — Z79899 Other long term (current) drug therapy: Secondary | ICD-10-CM | POA: Diagnosis not present

## 2022-08-01 DIAGNOSIS — E039 Hypothyroidism, unspecified: Secondary | ICD-10-CM | POA: Diagnosis present

## 2022-08-01 DIAGNOSIS — Z7985 Long-term (current) use of injectable non-insulin antidiabetic drugs: Secondary | ICD-10-CM | POA: Diagnosis not present

## 2022-08-01 DIAGNOSIS — Z7982 Long term (current) use of aspirin: Secondary | ICD-10-CM | POA: Diagnosis not present

## 2022-08-01 DIAGNOSIS — R197 Diarrhea, unspecified: Secondary | ICD-10-CM | POA: Diagnosis not present

## 2022-08-01 DIAGNOSIS — E7800 Pure hypercholesterolemia, unspecified: Secondary | ICD-10-CM | POA: Diagnosis present

## 2022-08-01 DIAGNOSIS — Z20822 Contact with and (suspected) exposure to covid-19: Secondary | ICD-10-CM | POA: Diagnosis present

## 2022-08-01 DIAGNOSIS — Z7984 Long term (current) use of oral hypoglycemic drugs: Secondary | ICD-10-CM | POA: Diagnosis not present

## 2022-08-01 DIAGNOSIS — Z7901 Long term (current) use of anticoagulants: Secondary | ICD-10-CM | POA: Diagnosis not present

## 2022-08-01 DIAGNOSIS — I10 Essential (primary) hypertension: Secondary | ICD-10-CM | POA: Diagnosis not present

## 2022-08-01 DIAGNOSIS — Z955 Presence of coronary angioplasty implant and graft: Secondary | ICD-10-CM | POA: Diagnosis not present

## 2022-08-02 ENCOUNTER — Encounter: Payer: Self-pay | Admitting: Internal Medicine

## 2022-08-02 DIAGNOSIS — K529 Noninfective gastroenteritis and colitis, unspecified: Secondary | ICD-10-CM | POA: Diagnosis not present

## 2022-08-02 DIAGNOSIS — Z79890 Hormone replacement therapy (postmenopausal): Secondary | ICD-10-CM | POA: Diagnosis not present

## 2022-08-02 DIAGNOSIS — Z79899 Other long term (current) drug therapy: Secondary | ICD-10-CM | POA: Diagnosis not present

## 2022-08-02 DIAGNOSIS — E039 Hypothyroidism, unspecified: Secondary | ICD-10-CM | POA: Diagnosis present

## 2022-08-02 DIAGNOSIS — G40109 Localization-related (focal) (partial) symptomatic epilepsy and epileptic syndromes with simple partial seizures, not intractable, without status epilepticus: Secondary | ICD-10-CM | POA: Diagnosis present

## 2022-08-02 DIAGNOSIS — Z7901 Long term (current) use of anticoagulants: Secondary | ICD-10-CM | POA: Diagnosis not present

## 2022-08-02 DIAGNOSIS — A045 Campylobacter enteritis: Secondary | ICD-10-CM | POA: Diagnosis present

## 2022-08-02 DIAGNOSIS — Z794 Long term (current) use of insulin: Secondary | ICD-10-CM | POA: Diagnosis not present

## 2022-08-02 DIAGNOSIS — Z7982 Long term (current) use of aspirin: Secondary | ICD-10-CM | POA: Diagnosis not present

## 2022-08-02 DIAGNOSIS — F32A Depression, unspecified: Secondary | ICD-10-CM | POA: Diagnosis present

## 2022-08-02 DIAGNOSIS — I10 Essential (primary) hypertension: Secondary | ICD-10-CM | POA: Diagnosis present

## 2022-08-02 DIAGNOSIS — Z955 Presence of coronary angioplasty implant and graft: Secondary | ICD-10-CM | POA: Diagnosis not present

## 2022-08-02 DIAGNOSIS — Z7951 Long term (current) use of inhaled steroids: Secondary | ICD-10-CM | POA: Diagnosis not present

## 2022-08-02 DIAGNOSIS — Z7985 Long-term (current) use of injectable non-insulin antidiabetic drugs: Secondary | ICD-10-CM | POA: Diagnosis not present

## 2022-08-02 DIAGNOSIS — E1142 Type 2 diabetes mellitus with diabetic polyneuropathy: Secondary | ICD-10-CM | POA: Diagnosis present

## 2022-08-02 DIAGNOSIS — E7800 Pure hypercholesterolemia, unspecified: Secondary | ICD-10-CM | POA: Diagnosis present

## 2022-08-02 DIAGNOSIS — Z20822 Contact with and (suspected) exposure to covid-19: Secondary | ICD-10-CM | POA: Diagnosis present

## 2022-08-02 DIAGNOSIS — J45909 Unspecified asthma, uncomplicated: Secondary | ICD-10-CM | POA: Diagnosis present

## 2022-08-02 DIAGNOSIS — G9340 Encephalopathy, unspecified: Secondary | ICD-10-CM | POA: Diagnosis present

## 2022-08-02 DIAGNOSIS — I4891 Unspecified atrial fibrillation: Secondary | ICD-10-CM | POA: Diagnosis present

## 2022-08-02 DIAGNOSIS — R4182 Altered mental status, unspecified: Secondary | ICD-10-CM | POA: Diagnosis not present

## 2022-08-02 DIAGNOSIS — Z7984 Long term (current) use of oral hypoglycemic drugs: Secondary | ICD-10-CM | POA: Diagnosis not present

## 2022-08-02 DIAGNOSIS — I2510 Atherosclerotic heart disease of native coronary artery without angina pectoris: Secondary | ICD-10-CM | POA: Diagnosis present

## 2022-08-05 ENCOUNTER — Telehealth: Payer: Self-pay

## 2022-08-05 DIAGNOSIS — I10 Essential (primary) hypertension: Secondary | ICD-10-CM | POA: Diagnosis not present

## 2022-08-05 DIAGNOSIS — E119 Type 2 diabetes mellitus without complications: Secondary | ICD-10-CM | POA: Diagnosis not present

## 2022-08-05 DIAGNOSIS — Z79899 Other long term (current) drug therapy: Secondary | ICD-10-CM | POA: Diagnosis not present

## 2022-08-05 DIAGNOSIS — I679 Cerebrovascular disease, unspecified: Secondary | ICD-10-CM | POA: Diagnosis not present

## 2022-08-05 DIAGNOSIS — R4182 Altered mental status, unspecified: Secondary | ICD-10-CM | POA: Diagnosis not present

## 2022-08-05 DIAGNOSIS — R0602 Shortness of breath: Secondary | ICD-10-CM | POA: Diagnosis not present

## 2022-08-05 DIAGNOSIS — Z7982 Long term (current) use of aspirin: Secondary | ICD-10-CM | POA: Diagnosis not present

## 2022-08-05 DIAGNOSIS — D688 Other specified coagulation defects: Secondary | ICD-10-CM | POA: Diagnosis not present

## 2022-08-05 DIAGNOSIS — I639 Cerebral infarction, unspecified: Secondary | ICD-10-CM | POA: Diagnosis not present

## 2022-08-05 DIAGNOSIS — D689 Coagulation defect, unspecified: Secondary | ICD-10-CM | POA: Diagnosis not present

## 2022-08-05 DIAGNOSIS — Z7901 Long term (current) use of anticoagulants: Secondary | ICD-10-CM | POA: Diagnosis not present

## 2022-08-05 DIAGNOSIS — G4733 Obstructive sleep apnea (adult) (pediatric): Secondary | ICD-10-CM | POA: Diagnosis not present

## 2022-08-05 DIAGNOSIS — Z87891 Personal history of nicotine dependence: Secondary | ICD-10-CM | POA: Diagnosis not present

## 2022-08-05 DIAGNOSIS — Z794 Long term (current) use of insulin: Secondary | ICD-10-CM | POA: Diagnosis not present

## 2022-08-05 DIAGNOSIS — R4701 Aphasia: Secondary | ICD-10-CM | POA: Diagnosis not present

## 2022-08-05 NOTE — Transitions of Care (Post Inpatient/ED Visit) (Signed)
   08/05/2022  Name: Franklin Thornton MRN: [id number] DOB: 1945/11/26  Today's TOC FU Call Status: Today's TOC FU Call Status:: Successful TOC FU Call Competed TOC FU Call Complete Date: 08/05/22  Transition Care Management Follow-up Telephone Call Date of Discharge: 08/04/22 Discharge Facility: Other (Non-[facility] Facility) Type of Discharge: Inpatient Admission How have you been since you were released from the hospital?: Better Any questions or concerns?: Yes Patient Questions/Concerns:: Patient is concerned about brain damage. Patient Questions/Concerns Addressed: Notified Provider of Patient Questions/Concerns  Items Reviewed: Did you receive and understand the discharge instructions provided?: Yes Medications obtained and verified?: Yes (Medications Reviewed) Any new allergies since your discharge?: No Dietary orders reviewed?: No Do you have support at home?: Yes People in Home: spouse, child(ren), adult Name of Support/Comfort Primary Source: campylobacter  Home Care and Equipment/Supplies: Were Home Health Services Ordered?: No Any new equipment or medical supplies ordered?: No  Functional Questionnaire: Do you need assistance with bathing/showering or dressing?: No Do you need assistance with meal preparation?: No Do you need assistance with eating?: No Do you have difficulty maintaining continence: No Do you need assistance with getting out of bed/getting out of a chair/moving?: No Do you have difficulty managing or taking your medications?: No  Follow up appointments reviewed: PCP Follow-up appointment confirmed?: Yes Date of PCP follow-up appointment?: 08/07/22 Follow-up Provider: [facility] Follow-up appointment confirmed?: No Reason Specialist Follow-Up Not Confirmed: Patient has Specialist Provider Number and will Call for Appointment Do you need transportation to your follow-up appointment?: No Do you understand care options if your condition(s)  worsen?: Yes-patient verbalized understanding    SIGNATURE Lisabeth Devoid, CMA

## 2022-08-07 ENCOUNTER — Encounter: Payer: Self-pay | Admitting: Internal Medicine

## 2022-08-07 ENCOUNTER — Ambulatory Visit (INDEPENDENT_AMBULATORY_CARE_PROVIDER_SITE_OTHER): Payer: Medicare Other | Admitting: Internal Medicine

## 2022-08-07 VITALS — BP 110/74 | HR 67 | Temp 97.4°F | Ht 71.0 in | Wt 191.4 lb

## 2022-08-07 DIAGNOSIS — K529 Noninfective gastroenteritis and colitis, unspecified: Secondary | ICD-10-CM

## 2022-08-07 DIAGNOSIS — R404 Transient alteration of awareness: Secondary | ICD-10-CM

## 2022-08-07 DIAGNOSIS — N182 Chronic kidney disease, stage 2 (mild): Secondary | ICD-10-CM

## 2022-08-07 DIAGNOSIS — E1122 Type 2 diabetes mellitus with diabetic chronic kidney disease: Secondary | ICD-10-CM

## 2022-08-07 DIAGNOSIS — A045 Campylobacter enteritis: Secondary | ICD-10-CM

## 2022-08-07 DIAGNOSIS — F4323 Adjustment disorder with mixed anxiety and depressed mood: Secondary | ICD-10-CM

## 2022-08-07 LAB — CBC
Hematocrit: 43 % (ref 37.5–51.0)
Hemoglobin: 14.5 g/dL (ref 13.0–17.7)
MCH: 29.8 pg (ref 26.6–33.0)
MCHC: 33.7 g/dL (ref 31.5–35.7)
MCV: 88 fL (ref 79–97)
Platelets: 431 10*3/uL (ref 150–450)
RBC: 4.87 x10E6/uL (ref 4.14–5.80)
RDW: 13.5 % (ref 11.6–15.4)
WBC: 9.8 10*3/uL (ref 3.4–10.8)

## 2022-08-07 NOTE — Patient Instructions (Signed)
Confusion Confusion is the inability to think with your usual speed or clarity. Confusion can be caused by many things. People who are confused often describe their thinking as cloudy or unclear. Confusion can also include feeling disoriented. This means you are unaware of where you are or who you are. You may also not know the date or time. When confused, you may have trouble remembering, paying attention, or making decisions. Some people also act aggressively when they are confused. In some cases, confusion may come on quickly. In other cases, it may develop slowly over time. Confusion may be caused by medical conditions such as: Infections, such as a urinary tract infection (UTI). Low levels of oxygen, which can develop from conditions such as long-term lung disorders. Decrease in brain function due to dementia and other conditions that affect the brain, such as seizures, strokes, brain tumors, or head injuries. Mental health conditions, like panic attacks, anxiety, depression, and hallucinations. Confusion may also be caused by physical factors such as: Loss of fluid (dehydration) or an imbalance of salts and minerals in the body (electrolytes). Lack of certain nutrients like niacin, thiamine, or other B vitamins. Fever or hypothermia, which is a sudden drop in body temperature. Low or high blood sugar. Low or high blood pressure. Other causes include: Lack of sleep or changes in routine or surroundings, such as when traveling or staying in a hospital. Using too much alcohol, drugs, or medicine. Side effects of medicines, or taking medicines that affect other medicines (drug interactions). Follow these instructions at home: Pay attention to your symptoms. Tell your health care provider about any changes or if you develop new symptoms. Follow these instructions to control or treat symptoms. Ask a family member or friend for help if needed. Medicines  Take over-the-counter and prescription  medicines only as told by your health care provider. Ask your health care provider about changing or stopping any medicines that may be causing your confusion. Avoid pain medicines or sleep medicines until you have fully recovered. Use a pillbox or an alarm to help you take the right medicines at the right time. Lifestyle  Eat a balanced diet that includes fruits and vegetables. Get enough sleep. For most adults, this is 7-9 hours each night. Do not drink alcohol. Do not become isolated. Spend time with other people and make plans for your days. Do not drive until your health care provider says that it is safe to do so. Do not use any products that contain nicotine or tobacco, such as cigarettes, e-cigarettes, and chewing tobacco. If you need help quitting, ask your health care provider. Stop other activities that may increase your chances of getting hurt. These may include some work duties, sports activities, swimming, or bike riding. Ask your health care provider what activities are safe for you. Tips for caregivers Find out if the person is confused. Ask the person to state his or her name, age, and the date. If the person is unsure or answers incorrectly, he or she may be confused and need assistance. Always introduce yourself, no matter how well the person knows you. Remind the person of his or her location. Place a calendar and clock near the person who is confused. Keep a regular schedule. Make sure the person has plenty of light during the day and sleep at night. Talk about current events and plans for the day. Keep the environment calm, quiet, and peaceful. Help the person do the things that he or she is unable to   do. These include: Taking medicines. Keeping medical appointments. Helping with household duties, including meal preparation. Running errands. Get help if you need it. There are several support groups for caregivers. If the person you are helping needs more support,  consider day care, extended-care programs, or a skilled nursing facility. The person's health care provider may be able to help evaluate these options. General instructions Monitor yourself for any conditions you may have. These can include: Checking your blood glucose levels if you have diabetes. Maintaining a healthy weight. Monitoring your blood pressure if you have hypertension. Monitoring your body temperature if you have a fever. Keep all follow-up visits. This is important. Contact a health care provider if: You have new symptoms or your symptoms get worse. Get help right away if you: Feel that you are not able to care for yourself. Develop severe headaches, repeated vomiting, seizures, blackouts, or slurred speech. Have increasing confusion, weakness, numbness, restlessness, or personality changes. Develop a loss of balance, have marked dizziness, feel uncoordinated, or fall. Develop severe anxiety, or you have delusions or hallucinations. These symptoms may represent a serious problem that is an emergency. Do not wait to see if the symptoms will go away. Get medical help right away. Call your local emergency services (911 in the [place] Do not drive yourself to the hospital. Summary Confusion is the inability to think with your usual speed or clarity. People who are confused often describe their thinking as cloudy or unclear. Confusion can also include having trouble remembering, paying attention, or making decisions. Confusion may come on quickly or develop slowly over time, depending on the cause. There are many different causes of confusion. Ask for help from family members or friends if you are unable to take care of yourself. This information is not intended to replace advice given to you by your health care provider. Make sure you discuss any questions you have with your health care provider. Document Revised: 08/03/2019 Document Reviewed: 08/03/2019 Elsevier Patient Education   2023 Elsevier Inc.  

## 2022-08-07 NOTE — Progress Notes (Signed)
I,[name],acting as a scribe for Gwynneth Aliment, MD.,have documented all relevant documentation on the behalf of Gwynneth Aliment, MD,as directed by  Gwynneth Aliment, MD while in the presence of Gwynneth Aliment, MD.    Subjective:     Patient ID: Franklin Thornton , male    DOB: November 22, 1945 , 77 y.o.   MRN: [id number]   Chief Complaint  Patient presents with   Hospitalization Follow-up    HPI  Patient presents today for HPFU. He is accompanied by his wife today. He was admitted at [facility] from 08/02/2022-08/04/2022 for altered mental status and diarrhea. She states he left home on 4/10, went with Buddy to [facility] for pool tournament. The next day, he had an episode of explosive diarrhea. He later became disoriented. He was diagnosed with colitis and treated with antibiotics.   Wife also took him to ED on 08/05/2022 because she observed he was having difficulty with word finding and hew as experiencing left hand/leg numbness. He was seen in ED, CT neg for acute findings.        Past Medical History:  Diagnosis Date   Atrial fibrillation (HCC)    Diabetes mellitus without complication (HCC)    Peripheral vascular disease (HCC)    Prostate cancer (HCC)    Uvular edema 07/17/2015     Family History  Problem Relation Age of Onset   Lung cancer Mother    Diabetes Mother    Esophageal cancer Father    Hypertension Sister    Breast cancer Sister    Healthy Son    Healthy Daughter      Current Outpatient Medications:    albuterol (PROVENTIL HFA;VENTOLIN HFA) 108 (90 Base) MCG/ACT inhaler, Inhale 2 puffs into the lungs every 6 (six) hours as needed for wheezing or shortness of breath., Disp: , Rfl:    albuterol (PROVENTIL) (2.5 MG/3ML) 0.083% nebulizer solution, Take 3 mLs (2.5 mg total) by nebulization every 6 (six) hours as needed for wheezing or shortness of breath., Disp: 75 mL, Rfl: 3   amiodarone (PACERONE) 100 MG tablet, Take 100 mg by mouth daily. , Disp: , Rfl:     amphetamine-dextroamphetamine (ADDERALL) 10 MG tablet, Take 1 tablet by mouth daily. Take 2 tablets in the morning and one tablet in the evenings., Disp: , Rfl:    Armodafinil 250 MG tablet, Take 125-250 mg by mouth every morning., Disp: , Rfl:    aspirin 81 MG tablet, Take 81 mg by mouth daily., Disp: , Rfl:    atorvastatin (LIPITOR) 40 MG tablet, Take 1 tablet (40 mg total) by mouth daily., Disp: 90 tablet, Rfl: 2   b complex vitamins capsule, Take 1 capsule by mouth daily., Disp: , Rfl:    carvedilol (COREG) 6.25 MG tablet, Take 6.25 mg by mouth 2 (two) times daily with a meal., Disp: , Rfl:    Cholecalciferol (VITAMIN D) 2000 units tablet, Take 2,000 Units by mouth daily., Disp: , Rfl:    diphenhydrAMINE (BENADRYL) 25 MG tablet, Take 25 mg by mouth every 6 (six) hours as needed for itching. , Disp: , Rfl:    donepezil (ARICEPT) 10 MG tablet, TAKE 1 TABLET AT BEDTIME, Disp: 90 tablet, Rfl: 3   DULoxetine (CYMBALTA) 30 MG capsule, TAKE 1 CAPSULE DAILY IN THE EVENING, Disp: 90 capsule, Rfl: 3   furosemide (LASIX) 20 MG tablet, Take 20 mg by mouth daily as needed. , Disp: , Rfl:    glucose blood (FREESTYLE LITE) test  strip, Use as instructed to check blood sugars  3 times per day dx: e11.65, Disp: 300 each, Rfl: 1   glucose monitoring kit (FREESTYLE) monitoring kit, 1 each by Does not apply route as needed for other., Disp: , Rfl:    Insulin Pen Needle (NOVOFINE PEN NEEDLE) 32G X 6 MM MISC, Use with insulin pens, Disp: 300 each, Rfl: 3   ipratropium (ATROVENT) 0.03 % nasal spray, Place 2 sprays into both nostrils every 12 (twelve) hours., Disp: , Rfl:    LANTUS SOLOSTAR 100 UNIT/ML Solostar Pen, Inject 26 Units into the skin daily., Disp: , Rfl:    levocetirizine (XYZAL) 5 MG tablet, Take 5 mg by mouth every evening. , Disp: , Rfl:    levothyroxine (SYNTHROID) 50 MCG tablet, Take 1 tablet (50 mcg total) by mouth daily., Disp: 90 tablet, Rfl: 3   lidocaine (LIDODERM) 5 %, Place 3 patches onto the  skin daily. May use 1-3 patches at one time, Remove & Discard patch within 12 hours or as directed by MD, Disp: 90 patch, Rfl: 0   losartan (COZAAR) 50 MG tablet, Take 50 mg by mouth 2 (two) times daily. , Disp: , Rfl:    Magnesium 250 MG TABS, Take 1 tablet by mouth daily., Disp: , Rfl:    methocarbamol (ROBAXIN) 750 MG tablet, Take 750 mg by mouth 3 (three) times daily as needed for muscle spasms. , Disp: , Rfl:    mirabegron ER (MYRBETRIQ) 50 MG TB24 tablet, Take 50 mg by mouth daily., Disp: , Rfl:    montelukast (SINGULAIR) 10 MG tablet, Take 10 mg by mouth at bedtime., Disp: , Rfl:    Multiple Vitamin (MULTIVITAMIN) tablet, Take 1 tablet by mouth daily., Disp: , Rfl:    nitroGLYCERIN (NITROSTAT) 0.4 MG SL tablet, Place 1 tablet (0.4 mg total) under the tongue every 5 (five) minutes as needed for chest pain., Disp: 30 tablet, Rfl: 2   Omega-3 Fatty Acids (FISH OIL) 1200 MG CAPS, Take 1,200 mg by mouth. , Disp: , Rfl:    pantoprazole (PROTONIX) 40 MG tablet, TAKE 1 TABLET DAILY, Disp: 90 tablet, Rfl: 3   pramipexole (MIRAPEX) 0.5 MG tablet, Take 2 tablets (1 mg total) by mouth in the morning and at bedtime. (Patient taking differently: Take 1 mg by mouth in the morning and at bedtime. One in the morning one in the afternoon and 2 at night.), Disp: 90 tablet, Rfl: 1   rivaroxaban (XARELTO) 10 MG TABS tablet, Take 10 mg by mouth daily., Disp: , Rfl:    Semaglutide, 1 MG/DOSE, (OZEMPIC, 1 MG/DOSE,) 4 MG/3ML SOPN, Inject 1 mg into the skin once a week., Disp: 9 mL, Rfl: 1   solifenacin (VESICARE) 5 MG tablet, Take 1 tablet (5 mg total) by mouth daily., Disp: 90 tablet, Rfl: 1   vitamin B-12 (CYANOCOBALAMIN) 1000 MCG tablet, Take 1,000 mcg by mouth daily., Disp: , Rfl:    WIXELA INHUB 100-50 MCG/ACT AEPB, Inhale 1 puff into the lungs 2 (two) times daily., Disp: 60 each, Rfl: 1   azelastine (ASTELIN) 0.1 % nasal spray, Place 1 spray into both nostrils 2 (two) times daily. Use in each nostril as directed  (Patient not taking: Reported on 05/24/2022), Disp: 30 mL, Rfl: 12   empagliflozin (JARDIANCE) 10 MG TABS tablet, Take 1 tablet (10 mg total) by mouth daily. (Patient not taking: Reported on 08/07/2022), Disp: 90 tablet, Rfl: 3   oxyCODONE-acetaminophen (PERCOCET) 5-325 MG tablet, Take 1 tablet by mouth every  6 (six) hours as needed for severe pain. (Patient not taking: Reported on 05/24/2022), Disp: 30 tablet, Rfl: 0  Current Facility-Administered Medications:    cyanocobalamin ((VITAMIN B-12)) injection 1,000 mcg, 1,000 mcg, Intramuscular, Once, [name], MD   Allergies  Allergen Reactions   No Known Allergies      Review of Systems  Constitutional: Negative.   HENT: Negative.    Cardiovascular: Negative.   Genitourinary: Negative.   Musculoskeletal: Negative.   Skin: Negative.   Allergic/Immunologic: Negative.   Neurological: Negative.   Hematological: Negative.      Today's Vitals   08/07/22 1635  BP: 110/74  Pulse: 67  Temp: (!) 97.4 F (36.3 C)  SpO2: 94%  Weight: 191 lb 6.4 oz (86.8 kg)  Height: 5\' 11"  (1.803 m)   Body mass index is 26.69 kg/m.  Wt Readings from Last 3 Encounters:  08/07/22 191 lb 6.4 oz (86.8 kg)  06/17/22 204 lb (92.5 kg)  05/24/22 207 lb 3.2 oz (94 kg)    Objective:  Physical Exam Vitals and nursing note reviewed.  Constitutional:      Appearance: Normal appearance.  HENT:     Head: Normocephalic and atraumatic.  Eyes:     Extraocular Movements: Extraocular movements intact.  Cardiovascular:     Rate and Rhythm: Normal rate and regular rhythm.     Heart sounds: Normal heart sounds.  Pulmonary:     Effort: Pulmonary effort is normal.     Breath sounds: Normal breath sounds.  Musculoskeletal:     Cervical back: Normal range of motion.  Skin:    General: Skin is warm.  Neurological:     General: No focal deficit present.     Mental Status: He is alert.  Psychiatric:        Mood and Affect: Mood normal.      Assessment And  Plan:     1. Colitis due to Campylobacter species Comments: Resolved. TCM performed. Treated with ceftriaxone, azithromycin 1000mg  x 1 and Flagyl. His sx have since improved. - CBC no Diff  2. Transient alteration of awareness Comments: Resolved after fluid rescuscitation. Also with 4/15 ED evaluation for similar sx. - Ambulatory referral to Neurology  3. Type 2 diabetes mellitus with stage 2 chronic kidney disease, without long-term current use of insulin (HCC) Comments: Chronic, he will c/w Ozempic and Jardiance. Importance of dietary compliance was d/w patient.  He states hospital d/c'd Janumet, but it was already d/c'd.  4. Adjustment reaction with anxiety and depression Comments: Unfortunately, [facility] not take referrals from outside providers. I will refer him to Psych in [place].     Patient was given opportunity to ask questions. Patient verbalized understanding of the plan and was able to repeat key elements of the plan. All questions were answered to their satisfaction.  .   IF YOU HAVE BEEN REFERRED TO A SPECIALIST, IT MAY TAKE 1-2 WEEKS TO SCHEDULE/PROCESS THE REFERRAL. IF YOU HAVE NOT HEARD FROM US/SPECIALIST IN TWO WEEKS, PLEASE GIVE [place] A CALL AT [phone].   THE PATIENT IS ENCOURAGED TO PRACTICE SOCIAL DISTANCING DUE TO THE COVID-19 PANDEMIC.

## 2022-08-08 ENCOUNTER — Other Ambulatory Visit: Payer: Self-pay | Admitting: Internal Medicine

## 2022-08-08 DIAGNOSIS — E860 Dehydration: Secondary | ICD-10-CM

## 2022-08-13 ENCOUNTER — Other Ambulatory Visit (INDEPENDENT_AMBULATORY_CARE_PROVIDER_SITE_OTHER): Payer: Medicare Other | Admitting: Internal Medicine

## 2022-08-13 ENCOUNTER — Encounter: Payer: Self-pay | Admitting: Behavioral Health

## 2022-08-13 ENCOUNTER — Ambulatory Visit (INDEPENDENT_AMBULATORY_CARE_PROVIDER_SITE_OTHER): Payer: Medicare Other | Admitting: Behavioral Health

## 2022-08-13 DIAGNOSIS — I2510 Atherosclerotic heart disease of native coronary artery without angina pectoris: Secondary | ICD-10-CM

## 2022-08-13 DIAGNOSIS — N182 Chronic kidney disease, stage 2 (mild): Secondary | ICD-10-CM

## 2022-08-13 DIAGNOSIS — I739 Peripheral vascular disease, unspecified: Secondary | ICD-10-CM

## 2022-08-13 DIAGNOSIS — R296 Repeated falls: Secondary | ICD-10-CM | POA: Diagnosis not present

## 2022-08-13 DIAGNOSIS — E1122 Type 2 diabetes mellitus with diabetic chronic kidney disease: Secondary | ICD-10-CM | POA: Diagnosis not present

## 2022-08-13 DIAGNOSIS — G4733 Obstructive sleep apnea (adult) (pediatric): Secondary | ICD-10-CM

## 2022-08-13 DIAGNOSIS — F422 Mixed obsessional thoughts and acts: Secondary | ICD-10-CM

## 2022-08-13 DIAGNOSIS — I700 Atherosclerosis of aorta: Secondary | ICD-10-CM | POA: Diagnosis not present

## 2022-08-13 DIAGNOSIS — I1310 Hypertensive heart and chronic kidney disease without heart failure, with stage 1 through stage 4 chronic kidney disease, or unspecified chronic kidney disease: Secondary | ICD-10-CM | POA: Diagnosis not present

## 2022-08-13 DIAGNOSIS — R2689 Other abnormalities of gait and mobility: Secondary | ICD-10-CM | POA: Diagnosis not present

## 2022-08-13 DIAGNOSIS — M5416 Radiculopathy, lumbar region: Secondary | ICD-10-CM | POA: Diagnosis not present

## 2022-08-13 NOTE — Progress Notes (Signed)
[facility] Counselor/Therapist Progress Note  Patient ID: WEAVER TWEED, MRN: [id number],    Date: 08/13/2022  Time Spent: 45 minutes spent in person with the patient.  Treatment Type: Individual Therapy  Reported Symptoms: Anxiety/stress  Mental Status Exam: Appearance:  Fairly Groomed     Behavior: Appropriate  Motor: Normal  Speech/Language:  Clear and Coherent  Affect: Appropriate  Mood: normal  Thought process: normal  Thought content:   WNL  Sensory/Perceptual disturbances:   WNL  Orientation: oriented to person, place, time/date, situation, day of week, and month of year  Attention: Good  Concentration: Good  Memory: fair  Fund of knowledge:  Fair  Insight:   Fair  Judgment:  Poor  Impulse Control: Poor   Risk Assessment: Danger to Self:  No Self-injurious Behavior: No Danger to Others: No Duty to Warn:no Physical Aggression / Violence:No  Access to Firearms a concern: No  Gang Involvement:No   Subjective: The patient reported that he went to visit his daughter and is not feeling well and had to be taken to the hospital.  He said that for a couple of days he did not recognize his family or where he was.  They found that he had food poisoning.  He suspects that he came from somebait that he uses for fishing.  The freezer that he had it and got unplugged.  He would like to back him but when he went to use it he thinks it was bad and he ate some crackers after using the bait for fishing.  It has just been in the last couple of days that he feels that his mind has been completely clear and his energy level has gone better.  He jokingly said that he knows why he has a problem with gambling.  With some of the machines he has figured out a system and has won a fair amount of money the last 4 times he has tried.  He is saving for a pool tournament that is going to in the summer and putting it aside without touching it.  He says he is limiting himself to a  maximum of $100 on those machines.  That led to a conversation about accountability with his wife, with a friend or one of his children when he feels himself to be tempted.  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  He says he could be good well and sees something that he already has and still by it even if it does not cause too much.  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  He reports that he wants to sell it so his homework was to speak to his daughter and have her go through it with him listing it on [employer].  He knows that will help his financial situation as well as make his life feel better in terms of how much clutter there is.  He has taken some positive steps in that his wife asked him to be home and in bed by 10:00 going to shoot pool only 1 night per week.  He physically feels better is resting better and enjoying being home more. I encouraged him to continue this practice.  He does contract for safety. Interventions: Cognitive Behavioral Therapy  Diagnosis: PTSD  Plan: I will meet with the patient every 2 weeks and person. Treatment plan: To use  cognitive behavioral principles as well as elements of dialectical behavior therapy to help the patient work through his trauma as well as break the habits of things that he has used historically to help deal with his trauma.  Goals will be to help him recall the trauma without feeling overwhelmed or consumed with negative emotions the ultimate goal of helping him return to a pretty trauma level of functioning.  Interventions will be to provide education on PTSD to the patient, help him identify the traumatic events affected all aspects of his life, processed feelings associated with the trauma such as guilt and shame or sadness.  We will explore it as much detail as he is comfortable feelings regarding the trauma allowing for gradual  reduction of the emotional response by repeated storytelling.  We also identified negative self-defeating thinking and replace it with self enhancing talk as well as teach coping skills and relaxation techniques to help manage the anxiety/stress/guilt associated with the trauma.  Progress: 25 % French Ana, [facility]                 French Ana, [facility]

## 2022-08-13 NOTE — Progress Notes (Signed)
Received home health orders orders from [facility]. Start of care 06/07/22.   Certification and orders from 06/07/22 through 08/05/22 are reviewed, signed and faxed back to home health company.  Need of intermittent skilled services at home: SN, PT, OT  The home health care plan has been established by me and will be reviewed and updated as needed to maximize patient recovery.  I certify that all home health services have been and will be furnished to the patient while under my care.  Face-to-face encounter in which the need for home health services was established: 05/24/22.  Patient is receiving home health services for the following diagnoses: Problem List Items Addressed This Visit       Cardiovascular and Mediastinum   Hypertensive heart and renal disease   Atherosclerosis of native coronary artery of native heart without angina pectoris   PVD (peripheral vascular disease)   Atherosclerosis of aorta     Respiratory   Obstructive sleep apnea     Endocrine   Type 2 diabetes mellitus with stage 2 chronic kidney disease, without long-term current use of insulin     Nervous and Auditory   Lumbar radiculopathy     Other   Frequent falls   Imbalance - Primary     Gwynneth Aliment, MD

## 2022-08-26 DIAGNOSIS — Z79899 Other long term (current) drug therapy: Secondary | ICD-10-CM | POA: Diagnosis not present

## 2022-08-27 DIAGNOSIS — R202 Paresthesia of skin: Secondary | ICD-10-CM | POA: Diagnosis not present

## 2022-08-27 DIAGNOSIS — M5417 Radiculopathy, lumbosacral region: Secondary | ICD-10-CM | POA: Diagnosis not present

## 2022-08-27 DIAGNOSIS — M5412 Radiculopathy, cervical region: Secondary | ICD-10-CM | POA: Diagnosis not present

## 2022-08-27 DIAGNOSIS — R413 Other amnesia: Secondary | ICD-10-CM | POA: Diagnosis not present

## 2022-08-27 DIAGNOSIS — G603 Idiopathic progressive neuropathy: Secondary | ICD-10-CM | POA: Diagnosis not present

## 2022-08-28 LAB — HM DIABETES EYE EXAM

## 2022-09-03 ENCOUNTER — Encounter: Payer: Self-pay | Admitting: Behavioral Health

## 2022-09-03 ENCOUNTER — Ambulatory Visit: Payer: Medicare Other | Admitting: Behavioral Health

## 2022-09-03 DIAGNOSIS — F4310 Post-traumatic stress disorder, unspecified: Secondary | ICD-10-CM | POA: Diagnosis not present

## 2022-09-03 DIAGNOSIS — F411 Generalized anxiety disorder: Secondary | ICD-10-CM

## 2022-09-03 NOTE — Progress Notes (Signed)
[facility] Counselor/Therapist Progress Note  Patient ID: Franklin Thornton, MRN: [id number],    Date: 09/03/2022  Time Spent: 45 minutes spent in person with the patient.  Treatment Type: Individual Therapy  Reported Symptoms: Anxiety/stress  Mental Status Exam: Appearance:  Fairly Groomed     Behavior: Appropriate  Motor: Normal  Speech/Language:  Clear and Coherent  Affect: Appropriate  Mood: normal  Thought process: normal  Thought content:   WNL  Sensory/Perceptual disturbances:   WNL  Orientation: oriented to person, place, time/date, situation, day of week, and month of year  Attention: Good  Concentration: Good  Memory: fair  Fund of knowledge:  Fair  Insight:   Fair  Judgment:  Poor  Impulse Control: Poor   Risk Assessment: Danger to Self:  No Self-injurious Behavior: No Danger to Others: No Duty to Warn:no Physical Aggression / Violence:No  Access to Firearms a concern: No  Gang Involvement:No   Subjective: The patient says there have been some positives.  He has been fairly consistent about being home by 10:00 like he promises wife.  There are still some issues with him getting to sleep saying he has a hard time getting comfortable physically between restless leg syndrome and back issues.  He has not done any gambling on machines said that he promised his wife she could go with him for accountability.  He knows that if she is there if he when his she will pocket some of the money and not letting him spend it.  He knows that is a better alternative to going by himself.  He feels that he is shooting well so he has signed up for pool tournaments but feels that he is doing that responsibly.  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  We talked about him doing that 1 day a week for a couple of hours to gradually pare down all that he has.  He has not spoken to his daughter about  selling things because she is busy doing taxes right now but I encouraged him to please speak with her about that and start that while in motion. He does contract for safety. Interventions: Cognitive Behavioral Therapy  Diagnosis: PTSD  Plan: I will meet with the patient every 2 weeks and person. Treatment plan: To use cognitive behavioral principles as well as elements of dialectical behavior therapy to help the patient work through his trauma as well as break the habits of things that he has used historically to help deal with his trauma.  Goals will be to help him recall the trauma without feeling overwhelmed or consumed with negative emotions the ultimate goal of helping him return to a pretty trauma level of functioning.  Interventions will be to provide education on PTSD to the patient, help him identify the traumatic events affected all aspects of his life, processed feelings associated with the trauma such as guilt and shame or sadness.  We will explore it as much detail as he is comfortable feelings regarding the trauma allowing for gradual reduction of the emotional response by repeated storytelling.  We also identified negative self-defeating thinking and replace it with self enhancing talk as well as teach coping skills and relaxation techniques to help manage the anxiety/stress/guilt associated with the trauma.  Progress: 25 % [place], [facility]  Sabas Sous, [place] Yaakov S[place], [facility]

## 2022-09-06 DIAGNOSIS — E785 Hyperlipidemia, unspecified: Secondary | ICD-10-CM | POA: Diagnosis not present

## 2022-09-06 DIAGNOSIS — I152 Hypertension secondary to endocrine disorders: Secondary | ICD-10-CM | POA: Diagnosis not present

## 2022-09-06 DIAGNOSIS — E1169 Type 2 diabetes mellitus with other specified complication: Secondary | ICD-10-CM | POA: Diagnosis not present

## 2022-09-06 DIAGNOSIS — Z794 Long term (current) use of insulin: Secondary | ICD-10-CM | POA: Diagnosis not present

## 2022-09-06 DIAGNOSIS — E1142 Type 2 diabetes mellitus with diabetic polyneuropathy: Secondary | ICD-10-CM | POA: Diagnosis not present

## 2022-09-06 DIAGNOSIS — E1159 Type 2 diabetes mellitus with other circulatory complications: Secondary | ICD-10-CM | POA: Diagnosis not present

## 2022-09-06 DIAGNOSIS — E663 Overweight: Secondary | ICD-10-CM | POA: Diagnosis not present

## 2022-09-13 DIAGNOSIS — Z4682 Encounter for fitting and adjustment of non-vascular catheter: Secondary | ICD-10-CM | POA: Diagnosis not present

## 2022-09-13 DIAGNOSIS — R918 Other nonspecific abnormal finding of lung field: Secondary | ICD-10-CM | POA: Diagnosis not present

## 2022-09-13 DIAGNOSIS — E785 Hyperlipidemia, unspecified: Secondary | ICD-10-CM | POA: Diagnosis not present

## 2022-09-13 DIAGNOSIS — T50902A Poisoning by unspecified drugs, medicaments and biological substances, intentional self-harm, initial encounter: Secondary | ICD-10-CM | POA: Diagnosis not present

## 2022-09-13 DIAGNOSIS — J690 Pneumonitis due to inhalation of food and vomit: Secondary | ICD-10-CM | POA: Diagnosis not present

## 2022-09-13 DIAGNOSIS — Z794 Long term (current) use of insulin: Secondary | ICD-10-CM | POA: Diagnosis not present

## 2022-09-13 DIAGNOSIS — J90 Pleural effusion, not elsewhere classified: Secondary | ICD-10-CM | POA: Diagnosis not present

## 2022-09-13 DIAGNOSIS — J9601 Acute respiratory failure with hypoxia: Secondary | ICD-10-CM | POA: Diagnosis not present

## 2022-09-13 DIAGNOSIS — I4891 Unspecified atrial fibrillation: Secondary | ICD-10-CM | POA: Diagnosis not present

## 2022-09-13 DIAGNOSIS — E039 Hypothyroidism, unspecified: Secondary | ICD-10-CM | POA: Diagnosis not present

## 2022-09-13 DIAGNOSIS — E1140 Type 2 diabetes mellitus with diabetic neuropathy, unspecified: Secondary | ICD-10-CM | POA: Diagnosis not present

## 2022-09-13 DIAGNOSIS — R4189 Other symptoms and signs involving cognitive functions and awareness: Secondary | ICD-10-CM | POA: Diagnosis not present

## 2022-09-13 DIAGNOSIS — I2510 Atherosclerotic heart disease of native coronary artery without angina pectoris: Secondary | ICD-10-CM | POA: Diagnosis not present

## 2022-09-13 DIAGNOSIS — F339 Major depressive disorder, recurrent, unspecified: Secondary | ICD-10-CM | POA: Diagnosis not present

## 2022-09-13 DIAGNOSIS — Z7982 Long term (current) use of aspirin: Secondary | ICD-10-CM | POA: Diagnosis not present

## 2022-09-13 DIAGNOSIS — Z87891 Personal history of nicotine dependence: Secondary | ICD-10-CM | POA: Diagnosis not present

## 2022-09-13 DIAGNOSIS — R4182 Altered mental status, unspecified: Secondary | ICD-10-CM | POA: Diagnosis not present

## 2022-09-13 DIAGNOSIS — G4733 Obstructive sleep apnea (adult) (pediatric): Secondary | ICD-10-CM | POA: Diagnosis not present

## 2022-09-13 DIAGNOSIS — Z951 Presence of aortocoronary bypass graft: Secondary | ICD-10-CM | POA: Diagnosis not present

## 2022-09-13 DIAGNOSIS — G40119 Localization-related (focal) (partial) symptomatic epilepsy and epileptic syndromes with simple partial seizures, intractable, without status epilepticus: Secondary | ICD-10-CM | POA: Diagnosis not present

## 2022-09-13 DIAGNOSIS — T50904D Poisoning by unspecified drugs, medicaments and biological substances, undetermined, subsequent encounter: Secondary | ICD-10-CM | POA: Diagnosis not present

## 2022-09-13 DIAGNOSIS — R45851 Suicidal ideations: Secondary | ICD-10-CM | POA: Diagnosis not present

## 2022-09-13 DIAGNOSIS — Z978 Presence of other specified devices: Secondary | ICD-10-CM | POA: Diagnosis not present

## 2022-09-13 DIAGNOSIS — T1491XA Suicide attempt, initial encounter: Secondary | ICD-10-CM | POA: Diagnosis not present

## 2022-09-13 DIAGNOSIS — Z8546 Personal history of malignant neoplasm of prostate: Secondary | ICD-10-CM | POA: Diagnosis not present

## 2022-09-13 DIAGNOSIS — E119 Type 2 diabetes mellitus without complications: Secondary | ICD-10-CM | POA: Diagnosis not present

## 2022-09-13 DIAGNOSIS — E872 Acidosis, unspecified: Secondary | ICD-10-CM | POA: Diagnosis not present

## 2022-09-13 DIAGNOSIS — Z7985 Long-term (current) use of injectable non-insulin antidiabetic drugs: Secondary | ICD-10-CM | POA: Diagnosis not present

## 2022-09-13 DIAGNOSIS — E1122 Type 2 diabetes mellitus with diabetic chronic kidney disease: Secondary | ICD-10-CM | POA: Diagnosis not present

## 2022-09-13 DIAGNOSIS — C61 Malignant neoplasm of prostate: Secondary | ICD-10-CM | POA: Diagnosis not present

## 2022-09-13 DIAGNOSIS — I129 Hypertensive chronic kidney disease with stage 1 through stage 4 chronic kidney disease, or unspecified chronic kidney disease: Secondary | ICD-10-CM | POA: Diagnosis not present

## 2022-09-13 DIAGNOSIS — G40219 Localization-related (focal) (partial) symptomatic epilepsy and epileptic syndromes with complex partial seizures, intractable, without status epilepticus: Secondary | ICD-10-CM | POA: Diagnosis not present

## 2022-09-13 DIAGNOSIS — F32A Depression, unspecified: Secondary | ICD-10-CM | POA: Diagnosis not present

## 2022-09-13 DIAGNOSIS — R0602 Shortness of breath: Secondary | ICD-10-CM | POA: Diagnosis not present

## 2022-09-13 DIAGNOSIS — Z79899 Other long term (current) drug therapy: Secondary | ICD-10-CM | POA: Diagnosis not present

## 2022-09-13 DIAGNOSIS — T50901A Poisoning by unspecified drugs, medicaments and biological substances, accidental (unintentional), initial encounter: Secondary | ICD-10-CM | POA: Diagnosis not present

## 2022-09-13 DIAGNOSIS — R0989 Other specified symptoms and signs involving the circulatory and respiratory systems: Secondary | ICD-10-CM | POA: Diagnosis not present

## 2022-09-13 DIAGNOSIS — R680 Hypothermia, not associated with low environmental temperature: Secondary | ICD-10-CM | POA: Diagnosis not present

## 2022-09-13 DIAGNOSIS — I10 Essential (primary) hypertension: Secondary | ICD-10-CM | POA: Diagnosis not present

## 2022-09-13 DIAGNOSIS — R0603 Acute respiratory distress: Secondary | ICD-10-CM | POA: Diagnosis not present

## 2022-09-13 DIAGNOSIS — F411 Generalized anxiety disorder: Secondary | ICD-10-CM | POA: Diagnosis not present

## 2022-09-13 DIAGNOSIS — F4310 Post-traumatic stress disorder, unspecified: Secondary | ICD-10-CM | POA: Diagnosis not present

## 2022-09-13 DIAGNOSIS — R402433 Glasgow coma scale score 3-8, at hospital admission: Secondary | ICD-10-CM | POA: Diagnosis not present

## 2022-09-13 DIAGNOSIS — Z7901 Long term (current) use of anticoagulants: Secondary | ICD-10-CM | POA: Diagnosis not present

## 2022-09-17 ENCOUNTER — Ambulatory Visit: Payer: TRICARE For Life (TFL) | Admitting: Behavioral Health

## 2022-09-17 DIAGNOSIS — E119 Type 2 diabetes mellitus without complications: Secondary | ICD-10-CM | POA: Diagnosis not present

## 2022-09-17 DIAGNOSIS — H00025 Hordeolum internum left lower eyelid: Secondary | ICD-10-CM | POA: Diagnosis not present

## 2022-09-17 DIAGNOSIS — H04123 Dry eye syndrome of bilateral lacrimal glands: Secondary | ICD-10-CM | POA: Diagnosis not present

## 2022-09-17 DIAGNOSIS — H02831 Dermatochalasis of right upper eyelid: Secondary | ICD-10-CM | POA: Diagnosis not present

## 2022-09-17 DIAGNOSIS — H26492 Other secondary cataract, left eye: Secondary | ICD-10-CM | POA: Diagnosis not present

## 2022-09-17 DIAGNOSIS — H43813 Vitreous degeneration, bilateral: Secondary | ICD-10-CM | POA: Diagnosis not present

## 2022-09-17 DIAGNOSIS — I4891 Unspecified atrial fibrillation: Secondary | ICD-10-CM | POA: Diagnosis not present

## 2022-09-17 DIAGNOSIS — Z961 Presence of intraocular lens: Secondary | ICD-10-CM | POA: Diagnosis not present

## 2022-09-17 DIAGNOSIS — H40013 Open angle with borderline findings, low risk, bilateral: Secondary | ICD-10-CM | POA: Diagnosis not present

## 2022-09-17 DIAGNOSIS — H02834 Dermatochalasis of left upper eyelid: Secondary | ICD-10-CM | POA: Diagnosis not present

## 2022-09-18 ENCOUNTER — Telehealth: Payer: Self-pay

## 2022-09-18 DIAGNOSIS — I83892 Varicose veins of left lower extremities with other complications: Secondary | ICD-10-CM | POA: Diagnosis not present

## 2022-09-18 DIAGNOSIS — T43622D Poisoning by amphetamines, intentional self-harm, subsequent encounter: Secondary | ICD-10-CM | POA: Diagnosis not present

## 2022-09-18 DIAGNOSIS — G894 Chronic pain syndrome: Secondary | ICD-10-CM | POA: Diagnosis not present

## 2022-09-18 DIAGNOSIS — T5192XD Toxic effect of unspecified alcohol, intentional self-harm, subsequent encounter: Secondary | ICD-10-CM | POA: Diagnosis not present

## 2022-09-18 DIAGNOSIS — G40109 Localization-related (focal) (partial) symptomatic epilepsy and epileptic syndromes with simple partial seizures, not intractable, without status epilepticus: Secondary | ICD-10-CM | POA: Diagnosis not present

## 2022-09-18 DIAGNOSIS — J45909 Unspecified asthma, uncomplicated: Secondary | ICD-10-CM | POA: Diagnosis not present

## 2022-09-18 DIAGNOSIS — M47816 Spondylosis without myelopathy or radiculopathy, lumbar region: Secondary | ICD-10-CM | POA: Diagnosis not present

## 2022-09-18 DIAGNOSIS — M5116 Intervertebral disc disorders with radiculopathy, lumbar region: Secondary | ICD-10-CM | POA: Diagnosis not present

## 2022-09-18 DIAGNOSIS — G2581 Restless legs syndrome: Secondary | ICD-10-CM | POA: Diagnosis not present

## 2022-09-18 DIAGNOSIS — I4891 Unspecified atrial fibrillation: Secondary | ICD-10-CM | POA: Diagnosis not present

## 2022-09-18 DIAGNOSIS — I1310 Hypertensive heart and chronic kidney disease without heart failure, with stage 1 through stage 4 chronic kidney disease, or unspecified chronic kidney disease: Secondary | ICD-10-CM | POA: Diagnosis not present

## 2022-09-18 DIAGNOSIS — G4733 Obstructive sleep apnea (adult) (pediatric): Secondary | ICD-10-CM | POA: Diagnosis not present

## 2022-09-18 DIAGNOSIS — N393 Stress incontinence (female) (male): Secondary | ICD-10-CM | POA: Diagnosis not present

## 2022-09-18 DIAGNOSIS — J9601 Acute respiratory failure with hypoxia: Secondary | ICD-10-CM | POA: Diagnosis not present

## 2022-09-18 DIAGNOSIS — E039 Hypothyroidism, unspecified: Secondary | ICD-10-CM | POA: Diagnosis not present

## 2022-09-18 DIAGNOSIS — N189 Chronic kidney disease, unspecified: Secondary | ICD-10-CM | POA: Diagnosis not present

## 2022-09-18 DIAGNOSIS — T40712D Poisoning by cannabis, intentional self-harm, subsequent encounter: Secondary | ICD-10-CM | POA: Diagnosis not present

## 2022-09-18 DIAGNOSIS — E1142 Type 2 diabetes mellitus with diabetic polyneuropathy: Secondary | ICD-10-CM | POA: Diagnosis not present

## 2022-09-18 DIAGNOSIS — F339 Major depressive disorder, recurrent, unspecified: Secondary | ICD-10-CM | POA: Diagnosis not present

## 2022-09-18 DIAGNOSIS — E1122 Type 2 diabetes mellitus with diabetic chronic kidney disease: Secondary | ICD-10-CM | POA: Diagnosis not present

## 2022-09-18 DIAGNOSIS — E1151 Type 2 diabetes mellitus with diabetic peripheral angiopathy without gangrene: Secondary | ICD-10-CM | POA: Diagnosis not present

## 2022-09-18 DIAGNOSIS — F4310 Post-traumatic stress disorder, unspecified: Secondary | ICD-10-CM | POA: Diagnosis not present

## 2022-09-18 DIAGNOSIS — F411 Generalized anxiety disorder: Secondary | ICD-10-CM | POA: Diagnosis not present

## 2022-09-18 DIAGNOSIS — R413 Other amnesia: Secondary | ICD-10-CM | POA: Diagnosis not present

## 2022-09-18 DIAGNOSIS — I2510 Atherosclerotic heart disease of native coronary artery without angina pectoris: Secondary | ICD-10-CM | POA: Diagnosis not present

## 2022-09-18 NOTE — Transitions of Care (Post Inpatient/ED Visit) (Signed)
09/18/2022  Name: Franklin Thornton MRN: [id number] DOB: Sep 12, 1945  Today's TOC FU Call Status: Today's TOC FU Call Status:: Successful TOC FU Call Competed TOC FU Call Complete Date: 09/18/22  Transition Care Management Follow-up Telephone Call Date of Discharge: 09/16/22 Discharge Facility: Other ([facility]) Name of Other ([facility]) [facility]charge Facility: [facility] Type of Discharge: Inpatient Admission Primary Inpatient Discharge Diagnosis:: "depression,unspecified" How have you been since you were released from the hospital?: Better (Patient states he is 'feeling better overall." Appetite is good. He is sleeping fairly well. Denies any acute issues or concerns) Any questions or concerns?: No  Items Reviewed: Did you receive and understand the discharge instructions provided?: Yes Medications obtained,verified, and reconciled?: Yes (Medications Reviewed) Any new allergies since your discharge?: No Dietary orders reviewed?: Yes Type of Diet Ordered:: low salt/heart healthy/carb modified Do you have support at home?: Yes People in Home: spouse Name of Support/Comfort Primary Source: Dorothy  Medications Reviewed Today: Medications Reviewed Today     Reviewed by Charlyn Minerva, RN (Registered Nurse) on 09/18/22 at 1122  Med List Status: <None>   Medication Order Taking? Sig Documenting Provider Last Dose Status Informant  albuterol (PROVENTIL HFA;VENTOLIN HFA) 108 (90 Base) MCG/ACT inhaler [id number] Yes Inhale 2 puffs into the lungs every 6 (six) hours as needed for wheezing or shortness of breath. [provider] Taking Active   albuterol (PROVENTIL) (2.5 MG/3ML) 0.083% nebulizer solution [id number] Yes Take 3 mLs (2.5 mg total) by nebulization every 6 (six) hours as needed for wheezing or shortness of breath. Arnette Felts, FNP Taking Active   amiodarone (PACERONE) 100 MG tablet [id number] Yes Take 100 mg by mouth daily.  [provider] Taking Active    amoxicillin-clavulanate (AUGMENTIN) 875-125 MG tablet [id number] Yes Take 1 tablet by mouth 2 (two) times daily. Take one tablet by mouth 2 (two) times daily for 4 days. Start date: 09/16/2022, End date: 09/20/2022 [provider] Taking Active Self  amphetamine-dextroamphetamine (ADDERALL) 10 MG tablet [id number] Yes Take 1 tablet by mouth daily. Take 2 tablets in the morning and two tablets in the evenings. [provider] Taking Active Self  Armodafinil 250 MG tablet [id number]  Take 125-250 mg by mouth every morning. [provider]  Active   aspirin 81 MG tablet [id number] Yes Take 81 mg by mouth daily. [provider] Taking Active   atorvastatin (LIPITOR) 40 MG tablet [id number] Yes Take 1 tablet (40 mg total) by mouth daily. [name], MD Taking Active   azelastine (ASTELIN) 0.1 % nasal spray [id number] No Place 1 spray into both nostrils 2 (two) times daily. Use in each nostril as directed  Patient not taking: Reported on 05/24/2022   [name], MD Not Taking Active   b complex vitamins capsule [id number] Yes Take 1 capsule by mouth daily. [provider] Taking Active   carvedilol (COREG) 6.25 MG tablet [id number] Yes Take 6.25 mg by mouth 2 (two) times daily with a meal. [provider] Taking Active   Cholecalciferol (VITAMIN D) 2000 units tablet [id number] Yes Take 2,000 Units by mouth daily. [provider] Taking Active   cyanocobalamin ((VITAMIN B-12)) injection 1,000 mcg [id number]   [name], MD  Active   diphenhydrAMINE (BENADRYL) 25 MG tablet [id number] Yes Take 25 mg by mouth every 6 (six) hours as needed for itching.  [provider] Taking Active   donepezil (ARICEPT) 10 MG tablet [id number] Yes TAKE 1 TABLET AT BEDTIME [name], MD Taking Active  DULoxetine (CYMBALTA) 30 MG capsule [id number] Yes TAKE 1 CAPSULE DAILY IN THE Josephine Igo, MD Taking Active   empagliflozin (JARDIANCE) 10 MG TABS  tablet [id number] Yes Take 1 tablet (10 mg total) by mouth daily.  Patient taking differently: Take 10 mg by mouth daily. Pt states taking 25mg    [name], MD Taking Active Self  furosemide (LASIX) 20 MG tablet [id number] Yes Take 20 mg by mouth daily as needed.  [provider] Taking Active   glucose blood (FREESTYLE LITE) test strip [id number] Yes Use as instructed to check blood sugars  3 times per day dx: e11.65 [name], MD Taking Active   glucose monitoring kit (FREESTYLE) monitoring kit [id number] Yes 1 each by Does not apply route as needed for other. [provider] Taking Active   insulin lispro (HUMALOG KWIKPEN) 100 UNIT/ML KwikPen [id number] Yes 3 Units daily as needed. Inject 3 units for every 50 points over 200 if BG > 200 before meals. Max daily dose: 20 units. [provider] Taking Active Self  Insulin Pen Needle (NOVOFINE PEN NEEDLE) 32G X 6 MM MISC [id number] Yes Use with insulin pens [name], MD Taking Active   ipratropium (ATROVENT) 0.03 % nasal spray [id number] Yes Place 2 sprays into both nostrils every 12 (twelve) hours. [provider] Taking Active   LANTUS SOLOSTAR 100 UNIT/ML Solostar Pen [id number] Yes Inject 24 Units into the skin daily. Pt taking 24units-reported on 09/18/22 [provider] Taking Active Self  levocetirizine (XYZAL) 5 MG tablet [id number] Yes Take 5 mg by mouth every evening.  [provider] Taking Active   levothyroxine (SYNTHROID) 50 MCG tablet [id number] Yes Take 1 tablet (50 mcg total) by mouth daily. [name], MD Taking Active   lidocaine (LIDODERM) 5 % [id number] No Place 3 patches onto the skin daily. May use 1-3 patches at one time, Remove & Discard patch within 12 hours or as directed by MD  Patient not taking: Reported on 09/18/2022   [name], MD Not Taking Active   losartan (COZAAR) 50 MG tablet [id number] Yes Take 50 mg by mouth 2 (two) times daily.  [provider] Taking Active   Magnesium 250 MG TABS [id number] Yes Take 1 tablet by mouth daily. [provider] Taking Active   methocarbamol (ROBAXIN) 750 MG tablet [id number] No Take 750 mg by mouth 3 (three) times daily as needed for muscle spasms.   Patient not taking: Reported on 09/18/2022   [provider] Not Taking Active   mirabegron ER (MYRBETRIQ) 50 MG TB24 tablet [id number] Yes Take 50 mg by mouth daily. [provider] Taking Active   montelukast (SINGULAIR) 10 MG tablet [id number] Yes Take 10 mg by mouth at bedtime. [provider] Taking Active   Multiple Vitamin (MULTIVITAMIN) tablet [id number] Yes Take 1 tablet by mouth daily. [provider] Taking Active   nitroGLYCERIN (NITROSTAT) 0.4 MG SL tablet [id number] Yes Place 1 tablet (0.4 mg total) under the tongue every 5 (five) minutes as needed for chest pain. [name], MD Taking Active   Omega-3 Fatty Acids (FISH OIL) 1200 MG CAPS [id number] Yes Take 1,200 mg by mouth.  [provider] Taking Active   oxyCODONE-acetaminophen (PERCOCET) 5-325 MG tablet [id number] No Take 1 tablet by mouth every 6 (six) hours as needed for severe pain.  Patient not taking: Reported on 05/24/2022   [name], MD Not Taking Active   pantoprazole (PROTONIX) 40 MG tablet [id number] Yes TAKE 1 TABLET  DAILY [name], MD Taking Active   pramipexole (MIRAPEX) 0.5 MG tablet [id number] Yes Take 2 tablets (1 mg total) by mouth in the morning and at bedtime.  Patient taking differently: Take 1 mg by mouth in the morning and at bedtime. One in the morning one in the afternoon and 2 at night.   [name], MD Taking Active   rivaroxaban (XARELTO) 10 MG TABS tablet [id number] Yes Take 10 mg by mouth daily. Take one tablet (15 mg dose) by mouth daily. Take with Dinner. [provider] Taking Active Self  Semaglutide, 1 MG/DOSE, (OZEMPIC, 1 MG/DOSE,) 4 MG/3ML SOPN [id number] Yes Inject 1 mg into the skin  once a week. [name], MD Taking Active   solifenacin (VESICARE) 5 MG tablet [id number] Yes Take 1 tablet (5 mg total) by mouth daily. [name], MD Taking Active   vitamin B-12 (CYANOCOBALAMIN) 1000 MCG tablet [id number] Yes Take 1,000 mcg by mouth daily. [provider] Taking Active            Med Note Harlan Stains   Tue Mar 20, 2021  2:43 PM)    Monte Fantasia INHUB 100-50 MCG/ACT AEPB [id number] No Inhale 1 puff into the lungs 2 (two) times daily.  Patient not taking: Reported on 09/18/2022   Arnette Felts, FNP Not Taking Active   Med List Note Allyne Gee, Melina Schools, MD 12/06/20 1448): NOrco prescribed by the [facility]            Home Care and Equipment/Supplies: Were Home Health Services Ordered?: No (pt states he was getting HHPT prior to admission from [facility]-therapist has called and coming out today for visit-pt states he thinks he will be discharged from services as he is doing good) Any new equipment or medical supplies ordered?: NA  Functional Questionnaire: Do you need assistance with bathing/showering or dressing?: No Do you need assistance with meal preparation?: No Do you need assistance with eating?: No Do you have difficulty maintaining continence: No Do you need assistance with getting out of bed/getting out of a chair/moving?: No Do you have difficulty managing or taking your medications?: No  Follow up appointments reviewed: PCP Follow-up appointment confirmed?: Yes Date of PCP follow-up appointment?: 09/26/22 (pt had appt already scheduled prio to admission-care guide changed visit type to hosp f/u) Follow-up Provider: Dr. Allyne Gee Specialist [facility] Follow-up appointment confirmed?: No (patient states he hs been referred to psych MD and is awaiting call regarding appt-he is unsure of MD name) Reason Specialist Follow-Up Not Confirmed: Patient has Specialist Provider Number and will Call for Appointment Do you need transportation to your follow-up  appointment?: No (pt confirms he is able to drive himself to appts) Do you understand care options if your condition(s) worsen?: Yes-patient verbalized understanding  SDOH Interventions Today    Flowsheet Row Most Recent Value  SDOH Interventions   Food Insecurity Interventions Intervention Not Indicated  Transportation Interventions Intervention Not Indicated      TOC Interventions Today    Flowsheet Row Most Recent Value  TOC Interventions   TOC Interventions Discussed/Reviewed TOC Interventions Discussed, Arranged PCP follow up within 7 days/Care Guide scheduled      Interventions Today    Flowsheet Row Most Recent Value  Chronic Disease   Chronic disease during today's visit Diabetes  General Interventions   General Interventions Discussed/Reviewed General Interventions Discussed, Doctor Visits  Doctor Visits Discussed/Reviewed Doctor Visits Discussed, PCP, Specialist  PCP/Specialist Visits Compliance with follow-up visit  Education Interventions   Education Provided Provided Education  Provided Verbal Education On Nutrition, When to see the doctor, Blood Sugar Monitoring, Mental Health/Coping with Illness  Mental Health Interventions   Mental Health Discussed/Reviewed Mental Health Discussed, Substance Abuse, Refer to Social Work for counseling, Depression, Coping Strategies  [pt with reent hosp admission related to depression and possible drug overdose]  Refer to Social Work for counseling regarding Substance Abuse, Other  [gambling issues]  Nutrition Interventions   Nutrition Discussed/Reviewed Nutrition Discussed, Adding fruits and vegetables, Decreasing salt, Decreasing sugar intake  Pharmacy Interventions   Pharmacy Dicussed/Reviewed Pharmacy Topics Discussed, Medications and their functions  Safety Interventions   Safety Discussed/Reviewed Safety Discussed       Alessandra Grout [facility] Care Management Care Management Community  Coordinator Direct Phone: [phone] Toll Free: [phone] Fax: [phone]

## 2022-09-24 ENCOUNTER — Ambulatory Visit: Payer: Self-pay | Admitting: Licensed Clinical Social Worker

## 2022-09-25 ENCOUNTER — Telehealth: Payer: Self-pay

## 2022-09-25 ENCOUNTER — Encounter: Payer: Self-pay | Admitting: Internal Medicine

## 2022-09-25 NOTE — Progress Notes (Unsigned)
09-25-2022: Left VM with patient and patient's wife to schedule appointment with Cherylin Mylar 09-27-2022:  Left VM with patient and patient's wife to schedule appointment with Cherylin Mylar

## 2022-09-25 NOTE — Patient Outreach (Signed)
  Care Coordination   Initial Visit Note   09/24/2022 Name: Franklin Thornton MRN: [id number] DOB: Nov 01, 1945  Franklin Thornton is a 77 y.o. year old male who sees [name], MD for primary care. I spoke with  Franklin Thornton by phone today.  What matters to the patients health and wellness today?  Symptom Management    Goals Addressed             This Visit's Progress    LCSW Plan of Care-Supportive Resources       Activities and task to complete in order to accomplish goals.   Keep all upcoming appointments discussed today Continue with compliance of taking medication prescribed by Doctor Implement healthy coping skills discussed to assist with management of symptoms Continue working with [facility] care team to assist with goals identified         SDOH assessments and interventions completed:  Yes  SDOH Interventions Today    Flowsheet Row Most Recent Value  SDOH Interventions   Food Insecurity Interventions Intervention Not Indicated  Housing Interventions Intervention Not Indicated  Transportation Interventions Intervention Not Indicated        Care Coordination Interventions:  Yes, provided  Interventions Today    Flowsheet Row Most Recent Value  Chronic Disease   Chronic disease during today's visit Diabetes, Hypertension (HTN)  General Interventions   General Interventions Discussed/Reviewed General Interventions Discussed, Doctor Visits, Community Resources  [LCSW introduced self and explained role within care coordination services. Supportive resources for [facility] discussed]  Doctor Visits Discussed/Reviewed Doctor Visits Discussed, Doctor Visits Reviewed  [Pt discussed recent hospitalization. F/up appt with PCP scheduled]  Mental Health Interventions   Mental Health Discussed/Reviewed Mental Health Discussed, Coping Strategies, Substance Abuse, Depression, Anxiety  [Pt identified stressors and hx of gambling addiction, in addition, to substances used to assist with  pain management. Pt receives strong support system. Daughter is in process of scheduling pt with psychiatrist and outpt counselor]  Nutrition Interventions   Nutrition Discussed/Reviewed Nutrition Discussed  Pharmacy Interventions   Pharmacy Dicussed/Reviewed Pharmacy Topics Discussed  Safety Interventions   Safety Discussed/Reviewed Safety Discussed  [Pt denies SI/HI. Crisis intervention resources discussed]       Follow up plan: Follow up call scheduled for 2-4 weeks    Encounter Outcome:  Pt. Visit Completed   Jenel Lucks, MSW, LCSW [facility] Care Management [facility]  [facility] [email or web address] Phone [phone] 6:07 PM

## 2022-09-25 NOTE — Patient Instructions (Signed)
Visit Information  Thank you for taking time to visit with me today. Please don't hesitate to contact me if I can be of assistance to you.   Following are the goals we discussed today:   Goals Addressed             This Visit's Progress    LCSW Plan of Care-Supportive Resources       Activities and task to complete in order to accomplish goals.   Keep all upcoming appointments discussed today Continue with compliance of taking medication prescribed by Doctor Implement healthy coping skills discussed to assist with management of symptoms Continue working with [facility] care team to assist with goals identified         Our next appointment is by telephone on 10/04/22 at 11 AM  Please call the care guide team at [phone] if you need to cancel or reschedule your appointment.   If you are experiencing a Mental Health or Behavioral Health Crisis or need someone to talk to, please call the Suicide and Crisis Lifeline: 988 call 911   Patient verbalizes understanding of instructions and care plan provided today and agrees to view in MyChart. Active MyChart status and patient understanding of how to access instructions and care plan via MyChart confirmed with patient.     Jenel Lucks, MSW, LCSW [facility] Care Management [place]  [facility] [email or web address] Phone [phone] 6:08 PM

## 2022-09-26 ENCOUNTER — Inpatient Hospital Stay: Payer: Medicare Other | Admitting: Internal Medicine

## 2022-09-27 ENCOUNTER — Ambulatory Visit: Payer: Medicare Other | Admitting: Internal Medicine

## 2022-09-27 ENCOUNTER — Encounter: Payer: Self-pay | Admitting: Internal Medicine

## 2022-09-27 VITALS — BP 90/68 | HR 60 | Temp 98.2°F | Ht 71.0 in | Wt 187.6 lb

## 2022-09-27 DIAGNOSIS — R4182 Altered mental status, unspecified: Secondary | ICD-10-CM | POA: Diagnosis not present

## 2022-09-27 DIAGNOSIS — Z79899 Other long term (current) drug therapy: Secondary | ICD-10-CM

## 2022-09-27 DIAGNOSIS — M6208 Separation of muscle (nontraumatic), other site: Secondary | ICD-10-CM

## 2022-09-27 DIAGNOSIS — T50904D Poisoning by unspecified drugs, medicaments and biological substances, undetermined, subsequent encounter: Secondary | ICD-10-CM | POA: Diagnosis not present

## 2022-09-27 DIAGNOSIS — F331 Major depressive disorder, recurrent, moderate: Secondary | ICD-10-CM | POA: Diagnosis not present

## 2022-09-27 DIAGNOSIS — J690 Pneumonitis due to inhalation of food and vomit: Secondary | ICD-10-CM | POA: Diagnosis not present

## 2022-09-27 DIAGNOSIS — M5416 Radiculopathy, lumbar region: Secondary | ICD-10-CM

## 2022-09-27 DIAGNOSIS — T1491XA Suicide attempt, initial encounter: Secondary | ICD-10-CM | POA: Diagnosis not present

## 2022-09-27 DIAGNOSIS — G47419 Narcolepsy without cataplexy: Secondary | ICD-10-CM

## 2022-09-27 DIAGNOSIS — F630 Pathological gambling: Secondary | ICD-10-CM | POA: Diagnosis not present

## 2022-09-27 NOTE — Progress Notes (Signed)
Subjective:  Patient ID: Franklin Thornton , male    DOB: [birth year] , 77 y.o.   MRN: [id number]  Chief Complaint  Patient presents with   Depression    HPI  Patient presents today for hospital follow up. He is accompanied by his daughter Almira Coaster. He was admitted to [facility] on 09/13/22 & discharged on 09/16/2022.  He was admitted due to unresponsiveness and suicidal ideation.  Daughter states he did not try to kill himself. He had been gambling earlier that day, and was upset at himself because he lost all of his winnings. He made the comment to his friend "I am sick of this, I just want it to be over".  He smoked some marijuana and had a few drinks.   His family reached out to him, he didn't answer. His friend was contacted to check on him, he was found unresponsive, he called EMS to evaluate the patient. He was taken to [facility] and given Narcan. No response, he was intubated for airway protection. Toxicology was POS for amphetamines and THC. Alcohol level was 13.  He was extubated on 5/26 w/o complications. Started on Unasyn and transitioned to Augmentin for possible aspiration pneumonia. He was then discharged in stable condition on 09/16/22.   Since discharge, he has felt well. No new issues. He admits to feeling pretty well today.   DM eye exam letter sent.        Past Medical History:  Diagnosis Date   Atrial fibrillation (HCC)    Diabetes mellitus without complication (HCC)    Peripheral vascular disease (HCC)    Prostate cancer (HCC)    Uvular edema 07/17/2015     Family History  Problem Relation Age of Onset   Lung cancer Mother    Diabetes Mother    Esophageal cancer Father    Hypertension Sister    Breast cancer Sister    Healthy Son    Healthy Daughter      Current Outpatient Medications:    albuterol (PROVENTIL HFA;VENTOLIN HFA) 108 (90 Base) MCG/ACT inhaler, Inhale 2 puffs into the lungs every 6 (six) hours as needed for  wheezing or shortness of breath., Disp: , Rfl:    albuterol (PROVENTIL) (2.5 MG/3ML) 0.083% nebulizer solution, Take 3 mLs (2.5 mg total) by nebulization every 6 (six) hours as needed for wheezing or shortness of breath., Disp: 75 mL, Rfl: 3   amiodarone (PACERONE) 100 MG tablet, Take 100 mg by mouth daily. , Disp: , Rfl:    amphetamine-dextroamphetamine (ADDERALL) 10 MG tablet, Take 1 tablet by mouth daily. Take 2 tablets in the morning and two tablets in the evenings., Disp: , Rfl:    Armodafinil 250 MG tablet, Take 125-250 mg by mouth every morning., Disp: , Rfl:    aspirin 81 MG tablet, Take 81 mg by mouth daily., Disp: , Rfl:    atorvastatin (LIPITOR) 40 MG tablet, Take 1 tablet (40 mg total) by mouth daily., Disp: 90 tablet, Rfl: 2   b complex vitamins capsule, Take 1 capsule by mouth daily., Disp: , Rfl:    carvedilol (COREG) 6.25 MG tablet, Take 6.25 mg by mouth 2 (two) times daily with a meal., Disp: , Rfl:    Cholecalciferol (VITAMIN D) 2000 units tablet, Take 2,000 Units by mouth daily., Disp: , Rfl:    diphenhydrAMINE (BENADRYL) 25 MG tablet, Take 25 mg by mouth every 6 (six) hours as needed for itching. , Disp: , Rfl:  donepezil (ARICEPT) 10 MG tablet, TAKE 1 TABLET AT BEDTIME, Disp: 90 tablet, Rfl: 3   DULoxetine (CYMBALTA) 30 MG capsule, TAKE 1 CAPSULE DAILY IN THE EVENING, Disp: 90 capsule, Rfl: 3   empagliflozin (JARDIANCE) 10 MG TABS tablet, Take 1 tablet (10 mg total) by mouth daily. (Patient taking differently: Take 10 mg by mouth daily. Pt states taking 25mg ), Disp: 90 tablet, Rfl: 3   furosemide (LASIX) 20 MG tablet, Take 20 mg by mouth daily as needed. , Disp: , Rfl:    glucose blood (FREESTYLE LITE) test strip, Use as instructed to check blood sugars  3 times per day dx: e11.65, Disp: 300 each, Rfl: 1   glucose monitoring kit (FREESTYLE) monitoring kit, 1 each by Does not apply route as needed for other., Disp: , Rfl:    insulin lispro (HUMALOG KWIKPEN) 100 UNIT/ML KwikPen,  3 Units daily as needed. Inject 3 units for every 50 points over 200 if BG > 200 before meals. Max daily dose: 20 units., Disp: , Rfl:    Insulin Pen Needle (NOVOFINE PEN NEEDLE) 32G X 6 MM MISC, Use with insulin pens, Disp: 300 each, Rfl: 3   ipratropium (ATROVENT) 0.03 % nasal spray, Place 2 sprays into both nostrils every 12 (twelve) hours., Disp: , Rfl:    LANTUS SOLOSTAR 100 UNIT/ML Solostar Pen, Inject 24 Units into the skin daily. Pt taking 24units-reported on 09/18/22, Disp: , Rfl:    levocetirizine (XYZAL) 5 MG tablet, Take 5 mg by mouth every evening. , Disp: , Rfl:    levothyroxine (SYNTHROID) 50 MCG tablet, Take 1 tablet (50 mcg total) by mouth daily., Disp: 90 tablet, Rfl: 3   losartan (COZAAR) 50 MG tablet, Take 50 mg by mouth 2 (two) times daily. , Disp: , Rfl:    Magnesium 250 MG TABS, Take 1 tablet by mouth daily., Disp: , Rfl:    mirabegron ER (MYRBETRIQ) 50 MG TB24 tablet, Take 50 mg by mouth daily., Disp: , Rfl:    montelukast (SINGULAIR) 10 MG tablet, Take 10 mg by mouth at bedtime., Disp: , Rfl:    Multiple Vitamin (MULTIVITAMIN) tablet, Take 1 tablet by mouth daily., Disp: , Rfl:    nitroGLYCERIN (NITROSTAT) 0.4 MG SL tablet, Place 1 tablet (0.4 mg total) under the tongue every 5 (five) minutes as needed for chest pain., Disp: 30 tablet, Rfl: 2   Omega-3 Fatty Acids (FISH OIL) 1200 MG CAPS, Take 1,200 mg by mouth. , Disp: , Rfl:    pantoprazole (PROTONIX) 40 MG tablet, TAKE 1 TABLET DAILY, Disp: 90 tablet, Rfl: 3   pramipexole (MIRAPEX) 0.5 MG tablet, Take 2 tablets (1 mg total) by mouth in the morning and at bedtime. (Patient taking differently: Take 1 mg by mouth in the morning and at bedtime. One in the morning one in the afternoon and 2 at night.), Disp: 90 tablet, Rfl: 1   rivaroxaban (XARELTO) 10 MG TABS tablet, Take 10 mg by mouth daily. Take one tablet (15 mg dose) by mouth daily. Take with Dinner., Disp: , Rfl:    Semaglutide, 1 MG/DOSE, (OZEMPIC, 1 MG/DOSE,) 4 MG/3ML  SOPN, Inject 1 mg into the skin once a week., Disp: 9 mL, Rfl: 1   solifenacin (VESICARE) 5 MG tablet, Take 1 tablet (5 mg total) by mouth daily., Disp: 90 tablet, Rfl: 1   vitamin B-12 (CYANOCOBALAMIN) 1000 MCG tablet, Take 1,000 mcg by mouth daily., Disp: , Rfl:    azelastine (ASTELIN) 0.1 % nasal spray, Place 1  spray into both nostrils 2 (two) times daily. Use in each nostril as directed (Patient not taking: Reported on 05/24/2022), Disp: 30 mL, Rfl: 12   lidocaine (LIDODERM) 5 %, Place 3 patches onto the skin daily. May use 1-3 patches at one time, Remove & Discard patch within 12 hours or as directed by MD (Patient not taking: Reported on 09/18/2022), Disp: 90 patch, Rfl: 0   methocarbamol (ROBAXIN) 750 MG tablet, Take 750 mg by mouth 3 (three) times daily as needed for muscle spasms.  (Patient not taking: Reported on 09/18/2022), Disp: , Rfl:    oxyCODONE-acetaminophen (PERCOCET) 5-325 MG tablet, Take 1 tablet by mouth every 6 (six) hours as needed for severe pain. (Patient not taking: Reported on 05/24/2022), Disp: 30 tablet, Rfl: 0   WIXELA INHUB 100-50 MCG/ACT AEPB, Inhale 1 puff into the lungs 2 (two) times daily. (Patient not taking: Reported on 09/18/2022), Disp: 60 each, Rfl: 1  Current Facility-Administered Medications:    cyanocobalamin ((VITAMIN B-12)) injection 1,000 mcg, 1,000 mcg, Intramuscular, Once, [name], MD   Allergies  Allergen Reactions   No Known Allergies      Review of Systems  Constitutional: Negative.   HENT: Negative.    Respiratory: Negative.    Cardiovascular: Negative.   Gastrointestinal: Negative.   Skin: Negative.   Hematological: Negative.   Psychiatric/Behavioral:  Positive for dysphoric mood.      Today's Vitals   09/27/22 1336  BP: 90/68  Pulse: 60  Temp: 98.2 F (36.8 C)  SpO2: 98%  Weight: 187 lb 9.6 oz (85.1 kg)  Height: 5\' 11"  (1.803 m)   Body mass index is 26.16 kg/m.  Wt Readings from Last 3 Encounters:  09/27/22 187 lb 9.6 oz  (85.1 kg)  08/07/22 191 lb 6.4 oz (86.8 kg)  06/17/22 204 lb (92.5 kg)    The 10-year ASCVD risk score (Arnett DK, et al., 2019) is: 29.8%   Values used to calculate the score:     Age: 52 years     Sex: Male     Is Non-Hispanic African American: No     Diabetic: Yes     Tobacco smoker: Yes     Systolic Blood Pressure: 90 mmHg     Is BP treated: Yes     HDL Cholesterol: 66 mg/dL     Total Cholesterol: 162 mg/dL  Objective:  Physical Exam Vitals and nursing note reviewed.  Constitutional:      Appearance: Normal appearance.  HENT:     Head: Normocephalic and atraumatic.  Eyes:     Extraocular Movements: Extraocular movements intact.  Cardiovascular:     Rate and Rhythm: Normal rate and regular rhythm.     Heart sounds: Normal heart sounds.  Pulmonary:     Effort: Pulmonary effort is normal.     Breath sounds: Normal breath sounds.  Abdominal:     General: Bowel sounds are normal.     Palpations: Abdomen is soft.     Comments: Diastasis recti  Musculoskeletal:     Cervical back: Normal range of motion.  Skin:    General: Skin is warm.  Neurological:     General: No focal deficit present.     Mental Status: He is alert.  Psychiatric:        Mood and Affect: Mood normal.         Assessment And Plan:  1. Suicide attempt [facility]) Comments: This is listed as discharge diagnosis. PT/daughter disagree w/ this diagnosis. They are  both aware why pt was given this diagnosis. He will be followed closely. TCM PERFORMED. A MEMBER OF THE CLINICAL TEAM SPOKE WITH THE PATIENT UPON DISCHARGE. DISCHARGE SUMMARY WAS REVIEWED IN FULL DETAIL DURING THE VISIT. MEDS RECONCILED AND COMPARED TO DISCHARGE MEDS. MEDICATION LIST WAS UPDATED AND REVIEWED WITH THE PATIENT. GREATER THAN 50% FACE TO FACE TIME WAS SPENT IN COUNSELING AND COORDINATION OF CARE. ALL QUESTIONS WERE ANSWERED TO THE SATISFACTION OF THE PATIENT.    2. Drug overdose of undetermined intent, subsequent encounter Comments:  Toxicology POS for amphetamines and THC.  ED records reviewed, he admits to [facility] use. He is prescribed Adderall.   3. Moderate episode of recurrent major depressive disorder (HCC) Comments: Chronic, he will c/w duloxetine 30mg  daily for now. He is aware Psych will likely adjust this regimen.  4. Gambling disorder, moderate Comments: He will establish care with psychiatrist in [place], [place] where his daughter lives. Dgtr asked to have MD send notes for review.  5. Lumbar radiculopathy Comments: Chronic.  He would like to be evaluated by Durwin Nora N/S. His daughter is interested in TOPS procedure. They both will forward name of provider once confirmed.  6. Diastasis recti Comments: He has had recent CT scan, no hernia noted. If he decides to pursue surgery, will need Plastics evaluation.  7. Aspiration pneumonia, unspecified aspiration pneumonia type, unspecified laterality, unspecified part of lung (HCC) Prophylactic treatment.  He has completed full course of abx, Augmentin.   8. Polypharmacy - BMP8+EGFR    Return if symptoms worsen or fail to improve.  Patient was given opportunity to ask questions. Patient verbalized understanding of the plan and was able to repeat key elements of the plan. All questions were answered to their satisfaction.   I, Gwynneth Aliment, MD, have reviewed all documentation for this visit. The documentation on 09/27/22 for the exam, diagnosis, procedures, and orders are all accurate and complete.  IF YOU HAVE BEEN REFERRED TO A SPECIALIST, IT MAY TAKE 1-2 WEEKS TO SCHEDULE/PROCESS THE REFERRAL. IF YOU HAVE NOT HEARD FROM US/SPECIALIST IN TWO WEEKS, PLEASE GIVE [place] A CALL AT [phone].

## 2022-09-27 NOTE — Patient Instructions (Signed)

## 2022-09-28 LAB — BMP8+EGFR
BUN/Creatinine Ratio: 21 (ref 10–24)
BUN: 17 mg/dL (ref 8–27)
CO2: 28 mmol/L (ref 20–29)
Calcium: 9.2 mg/dL (ref 8.6–10.2)
Chloride: 102 mmol/L (ref 96–106)
Creatinine, Ser: 0.8 mg/dL (ref 0.76–1.27)
Glucose: 86 mg/dL (ref 70–99)
Potassium: 4.7 mmol/L (ref 3.5–5.2)
Sodium: 142 mmol/L (ref 134–144)
eGFR: 91 mL/min/{1.73_m2} (ref >59)

## 2022-10-01 ENCOUNTER — Ambulatory Visit (INDEPENDENT_AMBULATORY_CARE_PROVIDER_SITE_OTHER): Payer: Medicare Other | Admitting: Behavioral Health

## 2022-10-01 ENCOUNTER — Encounter: Payer: Self-pay | Admitting: Behavioral Health

## 2022-10-01 DIAGNOSIS — F429 Obsessive-compulsive disorder, unspecified: Secondary | ICD-10-CM

## 2022-10-01 DIAGNOSIS — I1310 Hypertensive heart and chronic kidney disease without heart failure, with stage 1 through stage 4 chronic kidney disease, or unspecified chronic kidney disease: Secondary | ICD-10-CM | POA: Diagnosis not present

## 2022-10-01 DIAGNOSIS — E1122 Type 2 diabetes mellitus with diabetic chronic kidney disease: Secondary | ICD-10-CM | POA: Diagnosis not present

## 2022-10-01 DIAGNOSIS — T40712D Poisoning by cannabis, intentional self-harm, subsequent encounter: Secondary | ICD-10-CM | POA: Diagnosis not present

## 2022-10-01 DIAGNOSIS — J9601 Acute respiratory failure with hypoxia: Secondary | ICD-10-CM | POA: Diagnosis not present

## 2022-10-01 DIAGNOSIS — F411 Generalized anxiety disorder: Secondary | ICD-10-CM

## 2022-10-01 DIAGNOSIS — T43622D Poisoning by amphetamines, intentional self-harm, subsequent encounter: Secondary | ICD-10-CM | POA: Diagnosis not present

## 2022-10-01 DIAGNOSIS — T5192XD Toxic effect of unspecified alcohol, intentional self-harm, subsequent encounter: Secondary | ICD-10-CM | POA: Diagnosis not present

## 2022-10-01 NOTE — Progress Notes (Signed)
[facility] Counselor/Therapist Progress Note  Patient ID: Franklin Thornton, MRN: [id number],    Date: 10/01/2022  Time Spent: 56 minutes spent in person with the patient.  Treatment Type: Individual Therapy  Reported Symptoms: Anxiety/stress  Mental Status Exam: Appearance:  Fairly Groomed     Behavior: Appropriate  Motor: Normal  Speech/Language:  Clear and Coherent  Affect: Appropriate  Mood: normal  Thought process: normal  Thought content:   WNL  Sensory/Perceptual disturbances:   WNL  Orientation: oriented to person, place, time/date, situation, day of week, and month of year  Attention: Good  Concentration: Good  Memory: fair  Fund of knowledge:  Fair  Insight:   Fair  Judgment:  Poor  Impulse Control: Poor   Risk Assessment: Danger to Self:  No Self-injurious Behavior: No Danger to Others: No Duty to Warn:no Physical Aggression / Violence:No  Access to Firearms a concern: No  Gang Involvement:No   Subjective: Since our last session the patient says that the hospital unresponsive for about 3 days.  His wife was out of town at his daughter's house.  For someone repairing pool table and made about $750 which is what he needed for a trip he is taking for his pool tournament next month.  On the way home, he decided to stop and gamble about $100 but when he got there decided to gamble $300.  He turned the $300 in 2000 and his head knew he needed to stop but instead he gambled and lost everything he can earn including the $750 he got from working.  His wife had been calling him but he had been ignoring her phone calls.  He had tried in the past to take it with him for accountability but that did not help.  His friend and a son kept calling him and he finally answered the phone telling days that he was extremely angry with himself for the choices that he had made and that he was going all.  He said he went home and fixed a bloody Corrie Dandy drinking part of it and  smoking a joint and remembers nothing else beyond that point.  His friend and his son came to check on him and called 911 where he was taken to the hospital and even though it was checked out he was unresponsive for about 3 days.  He was on life support part of that time.  He seems to think there might be some medical basis for what happened because he says he had no other drugs or alcohol prior to that which he had right before becoming unresponsive.  He is scheduled to see a psychiatrist as well as an addiction specialist for therapy in [place] where his daughter lives.  The appointment with the doctor is next week and he thinks the appointment with the therapist soon after that.  He recognizes that his gambling is an addiction and that his poor playing is an addiction.  He acknowledges an addictive personality saying even growing up he had a problem with alcohol and for time with drugs.  He has gotten both of those under control per his report.  He did grow up with an alcoholic father and having alcohol issues while in the [employer] got treatment and also became an addiction counseling specialist.  We started looking at what might be lying underneath that including the time he was a child.  His father was an alcoholic.  He said starting in third grade he started to be  seen as a troublemaker and in some ways probably was per his report.  He dropped out of school at 15 work for a while and joined the [employer] at [employer].  He knows he made a lot of poor choices in that timeframe so we will look more at how that may contribute to his addictions.  I ask him to please call his insurance and see if they are paying for the addiction specialist.  I reminded him that I am not an addiction specialist what might be contributing to the addictions.  He even just before coming to the session the past week sticks twice and spent the last 7 dollarshe had knowing full well he probably would not make anything.  He did contract for going  straight home.  He said that his daughter and wife and son are all watching him more closely now and looking for ways for more accountability to help him move past this.  I am going to call him to see who the addiction specialist is and when that appointment is.  He does contract for safety. Interventions: Cognitive Behavioral Therapy  Diagnosis: PTSD  Plan: I will meet with the patient every 2 weeks and person. Treatment plan: To use cognitive behavioral principles as well as elements of dialectical behavior therapy to help the patient work through his trauma as well as break the habits of things that he has used historically to help deal with his trauma.  Goals will be to help him recall the trauma without feeling overwhelmed or consumed with negative emotions the ultimate goal of helping him return to a pretty trauma level of functioning.  Interventions will be to provide education on PTSD to the patient, help him identify the traumatic events affected all aspects of his life, processed feelings associated with the trauma such as guilt and shame or sadness.  We will explore it as much detail as he is comfortable feelings regarding the trauma allowing for gradual reduction of the emotional response by repeated storytelling.  We also identified negative self-defeating thinking and replace it with self enhancing talk as well as teach coping skills and relaxation techniques to help manage the anxiety/stress/guilt associated with the trauma.  Progress: 25 % [place], [facility]                 French Ana, [facility]               French Ana, [facility]               French Ana, [facility]

## 2022-10-03 ENCOUNTER — Other Ambulatory Visit: Payer: Self-pay

## 2022-10-03 DIAGNOSIS — E1122 Type 2 diabetes mellitus with diabetic chronic kidney disease: Secondary | ICD-10-CM

## 2022-10-04 ENCOUNTER — Encounter: Payer: Self-pay | Admitting: Licensed Clinical Social Worker

## 2022-10-04 DIAGNOSIS — M4712 Other spondylosis with myelopathy, cervical region: Secondary | ICD-10-CM | POA: Diagnosis not present

## 2022-10-04 DIAGNOSIS — M47817 Spondylosis without myelopathy or radiculopathy, lumbosacral region: Secondary | ICD-10-CM | POA: Diagnosis not present

## 2022-10-04 DIAGNOSIS — M5136 Other intervertebral disc degeneration, lumbar region: Secondary | ICD-10-CM | POA: Diagnosis not present

## 2022-10-04 DIAGNOSIS — M5137 Other intervertebral disc degeneration, lumbosacral region: Secondary | ICD-10-CM | POA: Diagnosis not present

## 2022-10-04 DIAGNOSIS — M545 Low back pain, unspecified: Secondary | ICD-10-CM | POA: Diagnosis not present

## 2022-10-04 DIAGNOSIS — M47816 Spondylosis without myelopathy or radiculopathy, lumbar region: Secondary | ICD-10-CM | POA: Diagnosis not present

## 2022-10-06 ENCOUNTER — Encounter: Payer: Self-pay | Admitting: Internal Medicine

## 2022-10-06 DIAGNOSIS — F331 Major depressive disorder, recurrent, moderate: Secondary | ICD-10-CM | POA: Insufficient documentation

## 2022-10-07 DIAGNOSIS — E1122 Type 2 diabetes mellitus with diabetic chronic kidney disease: Secondary | ICD-10-CM | POA: Diagnosis not present

## 2022-10-07 DIAGNOSIS — T40712D Poisoning by cannabis, intentional self-harm, subsequent encounter: Secondary | ICD-10-CM | POA: Diagnosis not present

## 2022-10-07 DIAGNOSIS — T43622D Poisoning by amphetamines, intentional self-harm, subsequent encounter: Secondary | ICD-10-CM | POA: Diagnosis not present

## 2022-10-07 DIAGNOSIS — I1310 Hypertensive heart and chronic kidney disease without heart failure, with stage 1 through stage 4 chronic kidney disease, or unspecified chronic kidney disease: Secondary | ICD-10-CM | POA: Diagnosis not present

## 2022-10-07 DIAGNOSIS — T5192XD Toxic effect of unspecified alcohol, intentional self-harm, subsequent encounter: Secondary | ICD-10-CM | POA: Diagnosis not present

## 2022-10-07 DIAGNOSIS — J9601 Acute respiratory failure with hypoxia: Secondary | ICD-10-CM | POA: Diagnosis not present

## 2022-10-08 ENCOUNTER — Other Ambulatory Visit (INDEPENDENT_AMBULATORY_CARE_PROVIDER_SITE_OTHER): Payer: Medicare Other | Admitting: Internal Medicine

## 2022-10-08 DIAGNOSIS — I739 Peripheral vascular disease, unspecified: Secondary | ICD-10-CM | POA: Diagnosis not present

## 2022-10-08 DIAGNOSIS — I2510 Atherosclerotic heart disease of native coronary artery without angina pectoris: Secondary | ICD-10-CM

## 2022-10-08 DIAGNOSIS — Z9181 History of falling: Secondary | ICD-10-CM | POA: Diagnosis not present

## 2022-10-08 DIAGNOSIS — I700 Atherosclerosis of aorta: Secondary | ICD-10-CM

## 2022-10-08 DIAGNOSIS — N182 Chronic kidney disease, stage 2 (mild): Secondary | ICD-10-CM

## 2022-10-08 DIAGNOSIS — G4733 Obstructive sleep apnea (adult) (pediatric): Secondary | ICD-10-CM | POA: Diagnosis not present

## 2022-10-08 DIAGNOSIS — E1122 Type 2 diabetes mellitus with diabetic chronic kidney disease: Secondary | ICD-10-CM | POA: Diagnosis not present

## 2022-10-08 DIAGNOSIS — C61 Malignant neoplasm of prostate: Secondary | ICD-10-CM | POA: Diagnosis not present

## 2022-10-08 DIAGNOSIS — R918 Other nonspecific abnormal finding of lung field: Secondary | ICD-10-CM

## 2022-10-08 DIAGNOSIS — N393 Stress incontinence (female) (male): Secondary | ICD-10-CM | POA: Diagnosis not present

## 2022-10-08 DIAGNOSIS — F4310 Post-traumatic stress disorder, unspecified: Secondary | ICD-10-CM | POA: Diagnosis not present

## 2022-10-08 DIAGNOSIS — N5231 Erectile dysfunction following radical prostatectomy: Secondary | ICD-10-CM | POA: Diagnosis not present

## 2022-10-08 DIAGNOSIS — M5416 Radiculopathy, lumbar region: Secondary | ICD-10-CM

## 2022-10-08 DIAGNOSIS — I1310 Hypertensive heart and chronic kidney disease without heart failure, with stage 1 through stage 4 chronic kidney disease, or unspecified chronic kidney disease: Secondary | ICD-10-CM

## 2022-10-08 DIAGNOSIS — F331 Major depressive disorder, recurrent, moderate: Secondary | ICD-10-CM

## 2022-10-08 DIAGNOSIS — N4883 Acquired buried penis: Secondary | ICD-10-CM | POA: Diagnosis not present

## 2022-10-08 NOTE — Progress Notes (Signed)
Chief complaint: Home health certification  Received home health orders orders from [facility]. Start of care 09/18/22.   Certification and orders from 09/18/22 through 11/16/22 are reviewed, signed and faxed back to home health company.  Need of intermittent skilled services at home: PT  The home health care plan has been established by me and will be reviewed and updated as needed to maximize patient recovery.  I certify that all home health services have been and will be furnished to the patient while under my care.  Face-to-face encounter in which the need for home health services was established: 09/16/22  Patient is receiving home health services for the following diagnoses: Problem List Items Addressed This Visit       Cardiovascular and Mediastinum   Hypertensive heart and renal disease   Atherosclerosis of native coronary artery of native heart without angina pectoris   PVD (peripheral vascular disease) (HCC)   Atherosclerosis of aorta (HCC)     Respiratory   Obstructive sleep apnea   Multiple pulmonary nodules     Endocrine   Type 2 diabetes mellitus with stage 2 chronic kidney disease, without long-term current use of insulin (HCC)     Nervous and Auditory   Lumbar radiculopathy     Other   Moderate episode of recurrent major depressive disorder (HCC) (Chronic)   Other Visit Diagnoses     History of fall [Z91.81]    -  Primary   PTSD (post-traumatic stress disorder) [F43.10]       OSA (obstructive sleep apnea) [G47.33]            Gwynneth Aliment, MD

## 2022-10-11 DIAGNOSIS — Z87891 Personal history of nicotine dependence: Secondary | ICD-10-CM | POA: Diagnosis not present

## 2022-10-11 DIAGNOSIS — I10 Essential (primary) hypertension: Secondary | ICD-10-CM | POA: Diagnosis not present

## 2022-10-11 DIAGNOSIS — E11622 Type 2 diabetes mellitus with other skin ulcer: Secondary | ICD-10-CM | POA: Diagnosis not present

## 2022-10-11 DIAGNOSIS — L0390 Cellulitis, unspecified: Secondary | ICD-10-CM | POA: Diagnosis not present

## 2022-10-11 DIAGNOSIS — Z7985 Long-term (current) use of injectable non-insulin antidiabetic drugs: Secondary | ICD-10-CM | POA: Diagnosis not present

## 2022-10-11 DIAGNOSIS — G4733 Obstructive sleep apnea (adult) (pediatric): Secondary | ICD-10-CM | POA: Diagnosis not present

## 2022-10-11 DIAGNOSIS — Z79899 Other long term (current) drug therapy: Secondary | ICD-10-CM | POA: Diagnosis not present

## 2022-10-11 DIAGNOSIS — I4891 Unspecified atrial fibrillation: Secondary | ICD-10-CM | POA: Diagnosis not present

## 2022-10-11 DIAGNOSIS — I82441 Acute embolism and thrombosis of right tibial vein: Secondary | ICD-10-CM | POA: Diagnosis not present

## 2022-10-11 DIAGNOSIS — L89152 Pressure ulcer of sacral region, stage 2: Secondary | ICD-10-CM | POA: Diagnosis not present

## 2022-10-11 DIAGNOSIS — L89899 Pressure ulcer of other site, unspecified stage: Secondary | ICD-10-CM | POA: Diagnosis not present

## 2022-10-11 DIAGNOSIS — I82443 Acute embolism and thrombosis of tibial vein, bilateral: Secondary | ICD-10-CM | POA: Diagnosis not present

## 2022-10-11 DIAGNOSIS — L03116 Cellulitis of left lower limb: Secondary | ICD-10-CM | POA: Diagnosis not present

## 2022-10-11 DIAGNOSIS — E785 Hyperlipidemia, unspecified: Secondary | ICD-10-CM | POA: Diagnosis not present

## 2022-10-11 DIAGNOSIS — Z794 Long term (current) use of insulin: Secondary | ICD-10-CM | POA: Diagnosis not present

## 2022-10-11 DIAGNOSIS — L03115 Cellulitis of right lower limb: Secondary | ICD-10-CM | POA: Diagnosis not present

## 2022-10-11 DIAGNOSIS — Z7901 Long term (current) use of anticoagulants: Secondary | ICD-10-CM | POA: Diagnosis not present

## 2022-10-15 ENCOUNTER — Ambulatory Visit (INDEPENDENT_AMBULATORY_CARE_PROVIDER_SITE_OTHER): Payer: Medicare Other | Admitting: Behavioral Health

## 2022-10-15 ENCOUNTER — Telehealth: Payer: Self-pay

## 2022-10-15 DIAGNOSIS — F411 Generalized anxiety disorder: Secondary | ICD-10-CM

## 2022-10-15 NOTE — Progress Notes (Signed)
[facility] Counselor/Therapist Progress Note  Patient ID: CARNEY SAXTON, MRN: [id number],    Date: 10/15/2022  Time Spent: 56 minutes spent in person with the patient.  Treatment Type: Individual Therapy  Reported Symptoms: Anxiety/stress  Mental Status Exam: Appearance:  Fairly Groomed     Behavior: Appropriate  Motor: Normal  Speech/Language:  Clear and Coherent  Affect: Appropriate  Mood: normal  Thought process: normal  Thought content:   WNL  Sensory/Perceptual disturbances:   WNL  Orientation: oriented to person, place, time/date, situation, day of week, and month of year  Attention: Good  Concentration: Good  Memory: fair  Fund of knowledge:  Fair  Insight:   Fair  Judgment:  Poor  Impulse Control: Poor   Risk Assessment: Danger to Self:  No Self-injurious Behavior: No Danger to Others: No Duty to Warn:no Physical Aggression / Violence:No  Access to Firearms a concern: No  Gang Involvement:No   Subjective:  He does Community education officer for safety. Interventions: Cognitive Behavioral Therapy  Diagnosis: PTSD  Plan: I will meet with the patient every 2 weeks and person. Treatment plan: To use cognitive behavioral principles as well as elements of dialectical behavior therapy to help the patient work through his trauma as well as break the habits of things that he has used historically to help deal with his trauma.  Goals will be to help him recall the trauma without feeling overwhelmed or consumed with negative emotions the ultimate goal of helping him return to a pretty trauma level of functioning.  Interventions will be to provide education on PTSD to the patient, help him identify the traumatic events affected all aspects of his life, processed feelings associated with the trauma such as guilt and shame or sadness.  We will explore it as much detail as he is comfortable feelings regarding the trauma allowing for gradual reduction of the emotional response by  repeated storytelling.  We also identified negative self-defeating thinking and replace it with self enhancing talk as well as teach coping skills and relaxation techniques to help manage the anxiety/stress/guilt associated with the trauma.  Progress: 25 % French Ana, [facility]                 French Ana, [facility]               French Ana, [facility]               French Ana, [facility]               French Ana, [facility]

## 2022-10-15 NOTE — Transitions of Care (Post Inpatient/ED Visit) (Signed)
10/15/2022  Name: Franklin Thornton MRN: [id number] DOB: June 23, 1945  Today's TOC FU Call Status:    Transition Care Management Follow-up Telephone Call Date of Discharge: 10/11/22 Discharge Facility: Other ([facility]) Name of Other ([facility]) Discharge Facility: [facility] Type of Discharge: Emergency Department Reason for ED Visit: Other: How have you been since you were released from the hospital?: Better Any questions or concerns?: No  Items Reviewed: Did you receive and understand the discharge instructions provided?: Yes Medications obtained,verified, and reconciled?: Yes (Medications Reviewed) Any new allergies since your discharge?: No Dietary orders reviewed?: NA Do you have support at home?: Yes People in Home: spouse Name of Support/Comfort Primary Source: dorothy Rayos  Medications Reviewed Today: Medications Reviewed Today     Reviewed by Coolidge Breeze, CMA (Certified Medical Assistant) on 10/15/22 at 0919  Med List Status: <None>   Medication Order Taking? Sig Documenting Provider Last Dose Status Informant  albuterol (PROVENTIL HFA;VENTOLIN HFA) 108 (90 Base) MCG/ACT inhaler [id number] Yes Inhale 2 puffs into the lungs every 6 (six) hours as needed for wheezing or shortness of breath. [provider] Taking Active   albuterol (PROVENTIL) (2.5 MG/3ML) 0.083% nebulizer solution [id number] Yes Take 3 mLs (2.5 mg total) by nebulization every 6 (six) hours as needed for wheezing or shortness of breath. Arnette Felts, FNP Taking Active   amiodarone (PACERONE) 100 MG tablet [id number] Yes Take 100 mg by mouth daily.  [provider] Taking Active   amphetamine-dextroamphetamine (ADDERALL) 10 MG tablet [id number] Yes Take 1 tablet by mouth daily. Take 2 tablets in the morning and two tablets in the evenings. [provider] Taking Active Self  Armodafinil 250 MG tablet [id number] Yes Take 125-250 mg by mouth every morning. [provider] Taking Active   aspirin 81 MG tablet [id number] Yes Take 81 mg by mouth daily. [provider] Taking Active   atorvastatin (LIPITOR) 40 MG tablet [id number] Yes Take 1 tablet (40 mg total) by mouth daily. [name], MD Taking Active   azelastine (ASTELIN) 0.1 % nasal spray [id number] No Place 1 spray into both nostrils 2 (two) times daily. Use in each nostril as directed  Patient not taking: Reported on 05/24/2022   [name], MD Not Taking Active   b complex vitamins capsule [id number] Yes Take 1 capsule by mouth daily. [provider] Taking Active   carvedilol (COREG) 6.25 MG tablet [id number] Yes Take 6.25 mg by mouth 2 (two) times daily with a meal. [provider] Taking Active   Cholecalciferol (VITAMIN D) 2000 units tablet [id number] Yes Take 2,000 Units by mouth daily. [provider] Taking Active   cyanocobalamin ((VITAMIN B-12)) injection 1,000 mcg [id number]   [name], MD  Active   diphenhydrAMINE (BENADRYL) 25 MG tablet [id number] Yes Take 25 mg by mouth every 6 (six) hours as needed for itching.  [provider] Taking Active   donepezil (ARICEPT) 10 MG tablet [id number] Yes TAKE 1 TABLET AT BEDTIME [name], MD Taking Active   DULoxetine (CYMBALTA) 30 MG capsule [id number] Yes TAKE 1 CAPSULE DAILY IN THE Josephine Igo, MD Taking Active   empagliflozin (JARDIANCE) 10 MG TABS tablet [id number] Yes Take 1 tablet (10 mg total) by mouth daily.  Patient taking differently: Take 10 mg by mouth daily. Pt states taking 25mg    [name], MD Taking Active Self  furosemide (LASIX) 20 MG tablet [id number] Yes Take 20 mg by mouth daily as needed.  [provider] Taking  Active   glucose blood (FREESTYLE LITE) test strip [id number] Yes Use as instructed to check blood sugars  3 times per day dx: e11.65 [name], MD Taking Active   glucose monitoring kit (FREESTYLE) monitoring kit [id number] Yes 1 each by  Does not apply route as needed for other. [provider] Taking Active   insulin lispro (HUMALOG KWIKPEN) 100 UNIT/ML KwikPen [id number] Yes 3 Units daily as needed. Inject 3 units for every 50 points over 200 if BG > 200 before meals. Max daily dose: 20 units. [provider] Taking Active Self  Insulin Pen Needle (NOVOFINE PEN NEEDLE) 32G X 6 MM MISC [id number] Yes Use with insulin pens [name], MD Taking Active   ipratropium (ATROVENT) 0.03 % nasal spray [id number] Yes Place 2 sprays into both nostrils every 12 (twelve) hours. [provider] Taking Active   LANTUS SOLOSTAR 100 UNIT/ML Solostar Pen [id number] Yes Inject 24 Units into the skin daily. Pt taking 24units-reported on 09/18/22 [provider] Taking Active Self  levocetirizine (XYZAL) 5 MG tablet [id number] Yes Take 5 mg by mouth every evening.  [provider] Taking Active   levothyroxine (SYNTHROID) 50 MCG tablet [id number] Yes Take 1 tablet (50 mcg total) by mouth daily. [name], MD Taking Active   lidocaine (LIDODERM) 5 % [id number] No Place 3 patches onto the skin daily. May use 1-3 patches at one time, Remove & Discard patch within 12 hours or as directed by MD  Patient not taking: Reported on 09/18/2022   [name], MD Not Taking Active   losartan (COZAAR) 50 MG tablet [id number] Yes Take 50 mg by mouth 2 (two) times daily.  [provider] Taking Active   Magnesium 250 MG TABS [id number] Yes Take 1 tablet by mouth daily. [provider] Taking Active   methocarbamol (ROBAXIN) 750 MG tablet [id number] No Take 750 mg by mouth 3 (three) times daily as needed for muscle spasms.   Patient not taking: Reported on 09/18/2022   [provider] Not Taking Active   mirabegron ER (MYRBETRIQ) 50 MG TB24 tablet [id number] Yes Take 50 mg by mouth daily. [provider] Taking Active   montelukast (SINGULAIR) 10 MG tablet [id number] Yes Take 10 mg by mouth at  bedtime. [provider] Taking Active   Multiple Vitamin (MULTIVITAMIN) tablet [id number] Yes Take 1 tablet by mouth daily. [provider] Taking Active   nitroGLYCERIN (NITROSTAT) 0.4 MG SL tablet [id number] Yes Place 1 tablet (0.4 mg total) under the tongue every 5 (five) minutes as needed for chest pain. [name], MD Taking Active   Omega-3 Fatty Acids (FISH OIL) 1200 MG CAPS [id number] Yes Take 1,200 mg by mouth.  [provider] Taking Active   oxyCODONE-acetaminophen (PERCOCET) 5-325 MG tablet [id number] No Take 1 tablet by mouth every 6 (six) hours as needed for severe pain.  Patient not taking: Reported on 05/24/2022   [name], MD Not Taking Active   pantoprazole (PROTONIX) 40 MG tablet [id number] Yes TAKE 1 TABLET DAILY [name], MD Taking Active   pramipexole (MIRAPEX) 0.5 MG tablet [id number] Yes Take 2 tablets (1 mg total) by mouth in the morning and at bedtime.  Patient taking differently: Take 1 mg by mouth in the morning and at bedtime. One in the morning one in the afternoon and 2 at night.   [name], MD Taking Active   rivaroxaban (XARELTO) 10 MG TABS tablet [id number] Yes Take 10 mg by mouth daily. Take one  tablet (15 mg dose) by mouth daily. Take with Dinner. [provider] Taking Active Self  Semaglutide, 1 MG/DOSE, (OZEMPIC, 1 MG/DOSE,) 4 MG/3ML SOPN [id number] Yes Inject 1 mg into the skin once a week. [name], MD Taking Active   solifenacin (VESICARE) 5 MG tablet [id number] Yes Take 1 tablet (5 mg total) by mouth daily. [name], MD Taking Active   vitamin B-12 (CYANOCOBALAMIN) 1000 MCG tablet [id number] Yes Take 1,000 mcg by mouth daily. [provider] Taking Active            Med Note Harlan Stains   Tue Mar 20, 2021  2:43 PM)    Monte Fantasia INHUB 100-50 MCG/ACT AEPB [id number] No Inhale 1 puff into the lungs 2 (two) times daily.  Patient not taking: Reported on 09/18/2022   Arnette Felts, FNP Not Taking  Active   Med List Note Allyne Gee, Melina Schools, MD 12/06/20 1448): NOrco prescribed by the [facility]            Home Care and Equipment/Supplies: Were Home Health Services Ordered?: No Any new equipment or medical supplies ordered?: No  Functional Questionnaire: Do you need assistance with bathing/showering or dressing?: No Do you need assistance with meal preparation?: No Do you need assistance with eating?: No Do you have difficulty maintaining continence: No Do you need assistance with getting out of bed/getting out of a chair/moving?: No Do you have difficulty managing or taking your medications?: No  Follow up appointments reviewed: PCP Follow-up appointment confirmed?: Yes Date of PCP follow-up appointment?: 10/21/22 Follow-up Provider: robyn  sanders Specialist [facility] Follow-up appointment confirmed?: NA Do you need transportation to your follow-up appointment?: No Do you understand care options if your condition(s) worsen?: Yes-patient verbalized understanding    SIGNATURE Randa Lynn, CMA

## 2022-10-16 ENCOUNTER — Encounter: Payer: Self-pay | Admitting: Behavioral Health

## 2022-10-16 DIAGNOSIS — J9601 Acute respiratory failure with hypoxia: Secondary | ICD-10-CM | POA: Diagnosis not present

## 2022-10-16 DIAGNOSIS — T40712D Poisoning by cannabis, intentional self-harm, subsequent encounter: Secondary | ICD-10-CM | POA: Diagnosis not present

## 2022-10-16 DIAGNOSIS — E1122 Type 2 diabetes mellitus with diabetic chronic kidney disease: Secondary | ICD-10-CM | POA: Diagnosis not present

## 2022-10-16 DIAGNOSIS — T43622D Poisoning by amphetamines, intentional self-harm, subsequent encounter: Secondary | ICD-10-CM | POA: Diagnosis not present

## 2022-10-16 DIAGNOSIS — I1310 Hypertensive heart and chronic kidney disease without heart failure, with stage 1 through stage 4 chronic kidney disease, or unspecified chronic kidney disease: Secondary | ICD-10-CM | POA: Diagnosis not present

## 2022-10-16 DIAGNOSIS — T5192XD Toxic effect of unspecified alcohol, intentional self-harm, subsequent encounter: Secondary | ICD-10-CM | POA: Diagnosis not present

## 2022-10-17 DIAGNOSIS — G2581 Restless legs syndrome: Secondary | ICD-10-CM | POA: Diagnosis not present

## 2022-10-17 DIAGNOSIS — J301 Allergic rhinitis due to pollen: Secondary | ICD-10-CM | POA: Diagnosis not present

## 2022-10-17 DIAGNOSIS — R0600 Dyspnea, unspecified: Secondary | ICD-10-CM | POA: Diagnosis not present

## 2022-10-17 DIAGNOSIS — R918 Other nonspecific abnormal finding of lung field: Secondary | ICD-10-CM | POA: Diagnosis not present

## 2022-10-17 DIAGNOSIS — J986 Disorders of diaphragm: Secondary | ICD-10-CM | POA: Diagnosis not present

## 2022-10-17 DIAGNOSIS — G4736 Sleep related hypoventilation in conditions classified elsewhere: Secondary | ICD-10-CM | POA: Diagnosis not present

## 2022-10-17 DIAGNOSIS — J4520 Mild intermittent asthma, uncomplicated: Secondary | ICD-10-CM | POA: Diagnosis not present

## 2022-10-18 DIAGNOSIS — G40109 Localization-related (focal) (partial) symptomatic epilepsy and epileptic syndromes with simple partial seizures, not intractable, without status epilepticus: Secondary | ICD-10-CM | POA: Diagnosis not present

## 2022-10-18 DIAGNOSIS — T5192XD Toxic effect of unspecified alcohol, intentional self-harm, subsequent encounter: Secondary | ICD-10-CM | POA: Diagnosis not present

## 2022-10-18 DIAGNOSIS — E039 Hypothyroidism, unspecified: Secondary | ICD-10-CM | POA: Diagnosis not present

## 2022-10-18 DIAGNOSIS — E1151 Type 2 diabetes mellitus with diabetic peripheral angiopathy without gangrene: Secondary | ICD-10-CM | POA: Diagnosis not present

## 2022-10-18 DIAGNOSIS — E1122 Type 2 diabetes mellitus with diabetic chronic kidney disease: Secondary | ICD-10-CM | POA: Diagnosis not present

## 2022-10-18 DIAGNOSIS — N189 Chronic kidney disease, unspecified: Secondary | ICD-10-CM | POA: Diagnosis not present

## 2022-10-18 DIAGNOSIS — G4733 Obstructive sleep apnea (adult) (pediatric): Secondary | ICD-10-CM | POA: Diagnosis not present

## 2022-10-18 DIAGNOSIS — R413 Other amnesia: Secondary | ICD-10-CM | POA: Diagnosis not present

## 2022-10-18 DIAGNOSIS — M5116 Intervertebral disc disorders with radiculopathy, lumbar region: Secondary | ICD-10-CM | POA: Diagnosis not present

## 2022-10-18 DIAGNOSIS — I1310 Hypertensive heart and chronic kidney disease without heart failure, with stage 1 through stage 4 chronic kidney disease, or unspecified chronic kidney disease: Secondary | ICD-10-CM | POA: Diagnosis not present

## 2022-10-18 DIAGNOSIS — N393 Stress incontinence (female) (male): Secondary | ICD-10-CM | POA: Diagnosis not present

## 2022-10-18 DIAGNOSIS — G894 Chronic pain syndrome: Secondary | ICD-10-CM | POA: Diagnosis not present

## 2022-10-18 DIAGNOSIS — I2510 Atherosclerotic heart disease of native coronary artery without angina pectoris: Secondary | ICD-10-CM | POA: Diagnosis not present

## 2022-10-18 DIAGNOSIS — G2581 Restless legs syndrome: Secondary | ICD-10-CM | POA: Diagnosis not present

## 2022-10-18 DIAGNOSIS — I4891 Unspecified atrial fibrillation: Secondary | ICD-10-CM | POA: Diagnosis not present

## 2022-10-18 DIAGNOSIS — J9601 Acute respiratory failure with hypoxia: Secondary | ICD-10-CM | POA: Diagnosis not present

## 2022-10-18 DIAGNOSIS — M47816 Spondylosis without myelopathy or radiculopathy, lumbar region: Secondary | ICD-10-CM | POA: Diagnosis not present

## 2022-10-18 DIAGNOSIS — E1142 Type 2 diabetes mellitus with diabetic polyneuropathy: Secondary | ICD-10-CM | POA: Diagnosis not present

## 2022-10-18 DIAGNOSIS — F4310 Post-traumatic stress disorder, unspecified: Secondary | ICD-10-CM | POA: Diagnosis not present

## 2022-10-18 DIAGNOSIS — T43622D Poisoning by amphetamines, intentional self-harm, subsequent encounter: Secondary | ICD-10-CM | POA: Diagnosis not present

## 2022-10-18 DIAGNOSIS — T40712D Poisoning by cannabis, intentional self-harm, subsequent encounter: Secondary | ICD-10-CM | POA: Diagnosis not present

## 2022-10-18 DIAGNOSIS — I83892 Varicose veins of left lower extremities with other complications: Secondary | ICD-10-CM | POA: Diagnosis not present

## 2022-10-18 DIAGNOSIS — J45909 Unspecified asthma, uncomplicated: Secondary | ICD-10-CM | POA: Diagnosis not present

## 2022-10-18 DIAGNOSIS — F411 Generalized anxiety disorder: Secondary | ICD-10-CM | POA: Diagnosis not present

## 2022-10-18 DIAGNOSIS — F339 Major depressive disorder, recurrent, unspecified: Secondary | ICD-10-CM | POA: Diagnosis not present

## 2022-10-20 DIAGNOSIS — M50023 Cervical disc disorder at C6-C7 level with myelopathy: Secondary | ICD-10-CM | POA: Diagnosis not present

## 2022-10-20 DIAGNOSIS — M545 Low back pain, unspecified: Secondary | ICD-10-CM | POA: Diagnosis not present

## 2022-10-20 DIAGNOSIS — M4712 Other spondylosis with myelopathy, cervical region: Secondary | ICD-10-CM | POA: Diagnosis not present

## 2022-10-20 DIAGNOSIS — M4312 Spondylolisthesis, cervical region: Secondary | ICD-10-CM | POA: Diagnosis not present

## 2022-10-20 DIAGNOSIS — M50022 Cervical disc disorder at C5-C6 level with myelopathy: Secondary | ICD-10-CM | POA: Diagnosis not present

## 2022-10-22 DIAGNOSIS — M5116 Intervertebral disc disorders with radiculopathy, lumbar region: Secondary | ICD-10-CM | POA: Diagnosis not present

## 2022-10-22 DIAGNOSIS — M4712 Other spondylosis with myelopathy, cervical region: Secondary | ICD-10-CM | POA: Diagnosis not present

## 2022-10-22 DIAGNOSIS — M4150 Other secondary scoliosis, site unspecified: Secondary | ICD-10-CM | POA: Diagnosis not present

## 2022-10-23 DIAGNOSIS — R001 Bradycardia, unspecified: Secondary | ICD-10-CM | POA: Diagnosis not present

## 2022-10-23 DIAGNOSIS — M7912 Myalgia of auxiliary muscles, head and neck: Secondary | ICD-10-CM | POA: Diagnosis not present

## 2022-10-23 DIAGNOSIS — M7918 Myalgia, other site: Secondary | ICD-10-CM | POA: Diagnosis not present

## 2022-10-23 DIAGNOSIS — G894 Chronic pain syndrome: Secondary | ICD-10-CM | POA: Diagnosis not present

## 2022-10-23 DIAGNOSIS — G301 Alzheimer's disease with late onset: Secondary | ICD-10-CM | POA: Diagnosis not present

## 2022-10-23 DIAGNOSIS — Z79899 Other long term (current) drug therapy: Secondary | ICD-10-CM | POA: Diagnosis not present

## 2022-10-23 DIAGNOSIS — I10 Essential (primary) hypertension: Secondary | ICD-10-CM | POA: Diagnosis not present

## 2022-10-23 DIAGNOSIS — I480 Paroxysmal atrial fibrillation: Secondary | ICD-10-CM | POA: Diagnosis not present

## 2022-10-23 DIAGNOSIS — E782 Mixed hyperlipidemia: Secondary | ICD-10-CM | POA: Diagnosis not present

## 2022-10-23 DIAGNOSIS — G2581 Restless legs syndrome: Secondary | ICD-10-CM | POA: Diagnosis not present

## 2022-11-04 DIAGNOSIS — I4891 Unspecified atrial fibrillation: Secondary | ICD-10-CM | POA: Diagnosis not present

## 2022-11-05 ENCOUNTER — Ambulatory Visit (INDEPENDENT_AMBULATORY_CARE_PROVIDER_SITE_OTHER): Payer: Medicare Other | Admitting: Behavioral Health

## 2022-11-05 ENCOUNTER — Encounter: Payer: Self-pay | Admitting: Behavioral Health

## 2022-11-05 DIAGNOSIS — J4520 Mild intermittent asthma, uncomplicated: Secondary | ICD-10-CM | POA: Diagnosis not present

## 2022-11-05 DIAGNOSIS — F4310 Post-traumatic stress disorder, unspecified: Secondary | ICD-10-CM | POA: Diagnosis not present

## 2022-11-05 DIAGNOSIS — J301 Allergic rhinitis due to pollen: Secondary | ICD-10-CM | POA: Diagnosis not present

## 2022-11-05 DIAGNOSIS — G2581 Restless legs syndrome: Secondary | ICD-10-CM | POA: Diagnosis not present

## 2022-11-05 DIAGNOSIS — R918 Other nonspecific abnormal finding of lung field: Secondary | ICD-10-CM | POA: Diagnosis not present

## 2022-11-05 DIAGNOSIS — G4736 Sleep related hypoventilation in conditions classified elsewhere: Secondary | ICD-10-CM | POA: Diagnosis not present

## 2022-11-05 NOTE — Progress Notes (Addendum)
 [facility] Counselor/Therapist Progress Note  Patient ID: Franklin Thornton, MRN: [id number],    Date: 11/05/2022  Time Spent: 54 minutes, 905 AM until 9:59 AM spent in person with the patient.  Treatment Type: Individual Therapy  Reported Symptoms: Anxiety/stress  Mental Status Exam: Appearance:  Fairly Groomed     Behavior: Appropriate  Motor: Normal  Speech/Language:  Clear and Coherent  Affect: Appropriate  Mood: normal  Thought process: normal  Thought content:   WNL  Sensory/Perceptual disturbances:   WNL  Orientation: oriented to person, place, time/date, situation, day of week, and month of year  Attention: Good  Concentration: Good  Memory: fair  Fund of knowledge:  Fair  Insight:   Fair  Judgment:  Poor  Impulse Control: Poor   Risk Assessment: Danger to Self:  No Self-injurious Behavior: No Danger to Others: No Duty to Warn:no Physical Aggression / Violence:No  Access to Firearms a concern: No  Gang Involvement:No   Subjective: The patient and his friends had a good time in [place].  He said they did not want him but played better that they did last year.  There were 3 other guys and he said he sensed some tension in the car ride home related to things that he wanted to do on the way home.  He said he stopped involved some scratch offs which one any divided the money did again so that took a while.  He feels that he is respectful of others differences and that maybe they were not as respectful of his but he does not want that to create tension so we talked about how to respectfully address that with the people that he traveled with.  He said he was surprised that there were places to gamble out there and he did but said he set up but did not stop to it.  He feels he is on a much better job of not stopping at places in this area and contracted to not do that on the way home.  He says his wife is giving him some  accountability because she knows where he is and how long he is supposed to be there.  He feels that the increase in the ADD medication has not helped month with concentration or with narcolepsy.  He will meet with his psychiatrist again soon to discuss a possible increase or change in medication.  He also has been having some pretty significant pain from restless leg and his neurologist started him on a new medication which over the past week or 2 has made a very positive difference and he is thankful for that.  He is sleeping a little bit better.  He continues to set healthy boundaries in terms of getting to bed at a consistent time.  He wants to look at feeling like he can be productive.  Because of back issues and leg issues he cannot do much to help his wife around the yard but set a goal that working on cleaning out his building.  We talked about compartmentalizing and choosing a small task to complete and initially so that he could feel a sense of purpose.  He said he had a friend come over and was going to help a few weeks ago and called asking for help again in the building.  I encouraged him to do that giving to him for homework.  He does  contract for safety. Interventions: Cognitive Behavioral Therapy  Diagnosis: PTSD  Plan: I will meet with the patient every 2 weeks and person. Treatment plan: To use cognitive behavioral principles as well as elements of dialectical behavior therapy to help the patient work through his trauma as well as break the habits of things that he has used historically to help deal with his trauma.  Goals will be to help him recall the trauma without feeling overwhelmed or consumed with negative emotions the ultimate goal of helping him return to a pretty trauma level of functioning.  Interventions will be to provide education on PTSD to the patient, help him identify the traumatic events affected all aspects of his life, processed feelings associated with the trauma  such as guilt and shame or sadness.  We will explore it as much detail as he is comfortable feelings regarding the trauma allowing for gradual reduction of the emotional response by repeated storytelling.  We also identified negative self-defeating thinking and replace it with self enhancing talk as well as teach coping skills and relaxation techniques to help manage the anxiety/stress/guilt associated with the trauma.  The patient chose not to return to therapy and did not schedule any future appointments.  Progress: 25 % [place], [facility]                                [place], [facility]

## 2022-11-06 DIAGNOSIS — T40712D Poisoning by cannabis, intentional self-harm, subsequent encounter: Secondary | ICD-10-CM | POA: Diagnosis not present

## 2022-11-06 DIAGNOSIS — E1122 Type 2 diabetes mellitus with diabetic chronic kidney disease: Secondary | ICD-10-CM | POA: Diagnosis not present

## 2022-11-06 DIAGNOSIS — J9601 Acute respiratory failure with hypoxia: Secondary | ICD-10-CM | POA: Diagnosis not present

## 2022-11-06 DIAGNOSIS — T43622D Poisoning by amphetamines, intentional self-harm, subsequent encounter: Secondary | ICD-10-CM | POA: Diagnosis not present

## 2022-11-06 DIAGNOSIS — I1310 Hypertensive heart and chronic kidney disease without heart failure, with stage 1 through stage 4 chronic kidney disease, or unspecified chronic kidney disease: Secondary | ICD-10-CM | POA: Diagnosis not present

## 2022-11-06 DIAGNOSIS — T5192XD Toxic effect of unspecified alcohol, intentional self-harm, subsequent encounter: Secondary | ICD-10-CM | POA: Diagnosis not present

## 2022-11-12 DIAGNOSIS — I10 Essential (primary) hypertension: Secondary | ICD-10-CM | POA: Diagnosis not present

## 2022-11-12 DIAGNOSIS — S0990XA Unspecified injury of head, initial encounter: Secondary | ICD-10-CM | POA: Diagnosis not present

## 2022-11-12 DIAGNOSIS — M546 Pain in thoracic spine: Secondary | ICD-10-CM | POA: Diagnosis not present

## 2022-11-12 DIAGNOSIS — Z87891 Personal history of nicotine dependence: Secondary | ICD-10-CM | POA: Diagnosis not present

## 2022-11-12 DIAGNOSIS — W1830XA Fall on same level, unspecified, initial encounter: Secondary | ICD-10-CM | POA: Diagnosis not present

## 2022-11-12 DIAGNOSIS — S022XXA Fracture of nasal bones, initial encounter for closed fracture: Secondary | ICD-10-CM | POA: Diagnosis not present

## 2022-11-12 DIAGNOSIS — S161XXA Strain of muscle, fascia and tendon at neck level, initial encounter: Secondary | ICD-10-CM | POA: Diagnosis not present

## 2022-11-12 DIAGNOSIS — S39012A Strain of muscle, fascia and tendon of lower back, initial encounter: Secondary | ICD-10-CM | POA: Diagnosis not present

## 2022-11-12 DIAGNOSIS — R296 Repeated falls: Secondary | ICD-10-CM | POA: Diagnosis not present

## 2022-11-12 DIAGNOSIS — Z043 Encounter for examination and observation following other accident: Secondary | ICD-10-CM | POA: Diagnosis not present

## 2022-11-14 ENCOUNTER — Telehealth: Payer: Self-pay

## 2022-11-14 DIAGNOSIS — I1310 Hypertensive heart and chronic kidney disease without heart failure, with stage 1 through stage 4 chronic kidney disease, or unspecified chronic kidney disease: Secondary | ICD-10-CM | POA: Diagnosis not present

## 2022-11-14 DIAGNOSIS — E1122 Type 2 diabetes mellitus with diabetic chronic kidney disease: Secondary | ICD-10-CM | POA: Diagnosis not present

## 2022-11-14 DIAGNOSIS — T5192XD Toxic effect of unspecified alcohol, intentional self-harm, subsequent encounter: Secondary | ICD-10-CM | POA: Diagnosis not present

## 2022-11-14 DIAGNOSIS — T43622D Poisoning by amphetamines, intentional self-harm, subsequent encounter: Secondary | ICD-10-CM | POA: Diagnosis not present

## 2022-11-14 DIAGNOSIS — T40712D Poisoning by cannabis, intentional self-harm, subsequent encounter: Secondary | ICD-10-CM | POA: Diagnosis not present

## 2022-11-14 DIAGNOSIS — J9601 Acute respiratory failure with hypoxia: Secondary | ICD-10-CM | POA: Diagnosis not present

## 2022-11-15 DIAGNOSIS — M62830 Muscle spasm of back: Secondary | ICD-10-CM | POA: Diagnosis not present

## 2022-11-15 DIAGNOSIS — M419 Scoliosis, unspecified: Secondary | ICD-10-CM | POA: Diagnosis not present

## 2022-11-15 DIAGNOSIS — M546 Pain in thoracic spine: Secondary | ICD-10-CM | POA: Diagnosis not present

## 2022-11-15 DIAGNOSIS — M545 Low back pain, unspecified: Secondary | ICD-10-CM | POA: Diagnosis not present

## 2022-11-15 NOTE — Telephone Encounter (Signed)
Error

## 2022-11-18 DIAGNOSIS — J45998 Other asthma: Secondary | ICD-10-CM | POA: Diagnosis not present

## 2022-11-18 DIAGNOSIS — R918 Other nonspecific abnormal finding of lung field: Secondary | ICD-10-CM | POA: Diagnosis not present

## 2022-11-18 DIAGNOSIS — I259 Chronic ischemic heart disease, unspecified: Secondary | ICD-10-CM | POA: Diagnosis not present

## 2022-11-18 DIAGNOSIS — G4736 Sleep related hypoventilation in conditions classified elsewhere: Secondary | ICD-10-CM | POA: Diagnosis not present

## 2022-11-19 DIAGNOSIS — M4712 Other spondylosis with myelopathy, cervical region: Secondary | ICD-10-CM | POA: Diagnosis not present

## 2022-11-19 DIAGNOSIS — M4150 Other secondary scoliosis, site unspecified: Secondary | ICD-10-CM | POA: Diagnosis not present

## 2022-11-20 ENCOUNTER — Encounter: Payer: Self-pay | Admitting: Family Medicine

## 2022-11-20 ENCOUNTER — Telehealth: Payer: Self-pay

## 2022-11-20 ENCOUNTER — Ambulatory Visit (INDEPENDENT_AMBULATORY_CARE_PROVIDER_SITE_OTHER): Payer: Medicare Other | Admitting: Family Medicine

## 2022-11-20 VITALS — BP 120/64 | HR 60 | Temp 97.8°F | Ht 71.0 in | Wt 187.0 lb

## 2022-11-20 DIAGNOSIS — M5416 Radiculopathy, lumbar region: Secondary | ICD-10-CM

## 2022-11-20 DIAGNOSIS — T148XXA Other injury of unspecified body region, initial encounter: Secondary | ICD-10-CM

## 2022-11-20 DIAGNOSIS — E162 Hypoglycemia, unspecified: Secondary | ICD-10-CM | POA: Diagnosis not present

## 2022-11-20 DIAGNOSIS — W19XXXD Unspecified fall, subsequent encounter: Secondary | ICD-10-CM | POA: Diagnosis not present

## 2022-11-20 MED ORDER — TRAMADOL HCL 50 MG PO TABS
50.0000 mg | ORAL_TABLET | Freq: Three times a day (TID) | ORAL | 0 refills | Status: DC | PRN
Start: 2022-11-20 — End: 2022-11-20

## 2022-11-20 MED ORDER — GVOKE HYPOPEN 2-PACK 0.5 MG/0.1ML ~~LOC~~ SOAJ
0.5000 mg | SUBCUTANEOUS | 1 refills | Status: DC
Start: 2022-11-20 — End: 2023-07-25

## 2022-11-20 NOTE — Transitions of Care (Post Inpatient/ED Visit) (Signed)
   11/20/2022  Name: Franklin Thornton MRN: [id number] DOB: 11/25/1945  Today's TOC FU Call Status: Today's TOC FU Call Status:: Successful TOC FU Call Competed TOC FU Call Complete Date: 11/20/22  Transition Care Management Follow-up Telephone Call Date of Discharge: 11/12/22 Discharge Facility: Other ([facility]) [facility] of [facility] ([facility]) [facility]charge Facility: [facility] Type of Discharge: Emergency Department Reason for ED Visit: Other: How have you been since you were released from the hospital?: Better Any questions or concerns?: No  Items Reviewed: Did you receive and understand the discharge instructions provided?: Yes Medications obtained,verified, and reconciled?: Yes (Medications Reviewed) Any new allergies since your discharge?: No Dietary orders reviewed?: No Do you have support at home?: Yes People in Home: spouse  Medications Reviewed Today: Medications Reviewed Today   Medications were not reviewed in this encounter     Home Care and Equipment/Supplies: Were Home Health Services Ordered?: NA Any new equipment or medical supplies ordered?: NA  Functional Questionnaire: Do you need assistance with bathing/showering or dressing?: No Do you need assistance with meal preparation?: No Do you need assistance with eating?: No Do you have difficulty maintaining continence: No Do you need assistance with getting out of bed/getting out of a chair/moving?: No Do you have difficulty managing or taking your medications?: No  Follow up appointments reviewed: PCP Follow-up appointment confirmed?: Yes Date of PCP follow-up appointment?: 11/20/22 Follow-up Provider: Ellender Hose [facility] Specialist [facility] Follow-up appointment confirmed?: No Reason Specialist Follow-Up Not Confirmed: Appointment Sceduled by [facility] Calling Clinician Do you need transportation to your follow-up appointment?: No Do you understand care options if your condition(s) worsen?:  Yes-patient verbalized understanding    SIGNATUREYL,RMA

## 2022-11-20 NOTE — Progress Notes (Unsigned)
I,Jameka J Llittleton, CMA,acting as a Neurosurgeon for [employer], NP.,have documented all relevant documentation on the behalf of Franklin Flath, NP,as directed by  Gleb Mcguire Moshe Salisbury, NP while in the presence of Garold Sheeler, NP.  Subjective:  Patient ID: Franklin Thornton , male    DOB: 05-17-1945 , 77 y.o.   MRN: [id number]  Chief Complaint  Patient presents with   Hospital F/u    HPI  Patient presents today for a hospital follow up .Patient had a fall on 11/11/22. He reports his dog and his daughter's cat were fighting and he tried to break it up when the dog pushed him causing him to fall down. He went to [facility] ER on that same day where he had CT spine/thoracic, CT spine/cervical and CT head were performed. They all came back negative.he was told he had muscle strain and put on muscle relaxers and oxycodone for a few days.He states he is feeling a little bit better.  Patient sees Dr Lorenso Courier of [facility], [place]  for Chronic Lumbar Radiculopathy. He went there yestersday and states he might be getting surgery soon.  Patient states that he has had episodes of hypoglycemia at home with his BS getting as low as  50s, will order Gvoke injection as needed but come to the office if episodes becomes frequent.     Past Medical History:  Diagnosis Date   Atrial fibrillation (HCC)    Diabetes mellitus without complication (HCC)    Peripheral vascular disease (HCC)    Prostate cancer (HCC)    Uvular edema 07/17/2015     Family History  Problem Relation Age of Onset   Lung cancer Mother    Diabetes Mother    Esophageal cancer Father    Hypertension Sister    Breast cancer Sister    Healthy Son    Healthy Daughter      Current Outpatient Medications:    albuterol (PROVENTIL HFA;VENTOLIN HFA) 108 (90 Base) MCG/ACT inhaler, Inhale 2 puffs into the lungs every 6 (six) hours as needed for wheezing or shortness of breath., Disp: , Rfl:    albuterol (PROVENTIL) (2.5 MG/3ML)  0.083% nebulizer solution, Take 3 mLs (2.5 mg total) by nebulization every 6 (six) hours as needed for wheezing or shortness of breath., Disp: 75 mL, Rfl: 3   amiodarone (PACERONE) 100 MG tablet, Take 100 mg by mouth daily. , Disp: , Rfl:    amphetamine-dextroamphetamine (ADDERALL) 10 MG tablet, Take 1 tablet by mouth daily. Take 2 tablets in the morning and two tablets in the evenings., Disp: , Rfl:    Armodafinil 250 MG tablet, Take 125-250 mg by mouth every morning., Disp: , Rfl:    aspirin 81 MG tablet, Take 81 mg by mouth daily., Disp: , Rfl:    atorvastatin (LIPITOR) 40 MG tablet, Take 1 tablet (40 mg total) by mouth daily., Disp: 90 tablet, Rfl: 2   azelastine (ASTELIN) 0.1 % nasal spray, Place 1 spray into both nostrils 2 (two) times daily. Use in each nostril as directed, Disp: 30 mL, Rfl: 12   b complex vitamins capsule, Take 1 capsule by mouth daily., Disp: , Rfl:    carvedilol (COREG) 6.25 MG tablet, Take 6.25 mg by mouth 2 (two) times daily with a meal., Disp: , Rfl:    Cholecalciferol (VITAMIN D) 2000 units tablet, Take 2,000 Units by mouth daily., Disp: , Rfl:    diphenhydrAMINE (BENADRYL) 25 MG tablet, Take 25 mg by mouth every 6 (six)  hours as needed for itching. , Disp: , Rfl:    donepezil (ARICEPT) 10 MG tablet, TAKE 1 TABLET AT BEDTIME, Disp: 90 tablet, Rfl: 3   DULoxetine (CYMBALTA) 30 MG capsule, TAKE 1 CAPSULE DAILY IN THE EVENING, Disp: 90 capsule, Rfl: 3   empagliflozin (JARDIANCE) 10 MG TABS tablet, Take 1 tablet (10 mg total) by mouth daily. (Patient taking differently: Take 10 mg by mouth daily. Pt states taking 25mg ), Disp: 90 tablet, Rfl: 3   furosemide (LASIX) 20 MG tablet, Take 20 mg by mouth daily as needed. , Disp: , Rfl:    Glucagon (GVOKE HYPOPEN 2-PACK) 0.5 MG/0.1ML SOAJ, Inject 0.5 mg into the skin as directed., Disp: 0.1 mL, Rfl: 1   glucose blood (FREESTYLE LITE) test strip, Use as instructed to check blood sugars  3 times per day dx: e11.65, Disp: 300 each,  Rfl: 1   glucose monitoring kit (FREESTYLE) monitoring kit, 1 each by Does not apply route as needed for other., Disp: , Rfl:    insulin lispro (HUMALOG KWIKPEN) 100 UNIT/ML KwikPen, 3 Units daily as needed. Inject 3 units for every 50 points over 200 if BG > 200 before meals. Max daily dose: 20 units., Disp: , Rfl:    Insulin Pen Needle (NOVOFINE PEN NEEDLE) 32G X 6 MM MISC, Use with insulin pens, Disp: 300 each, Rfl: 3   ipratropium (ATROVENT) 0.03 % nasal spray, Place 2 sprays into both nostrils every 12 (twelve) hours., Disp: , Rfl:    LANTUS SOLOSTAR 100 UNIT/ML Solostar Pen, Inject 24 Units into the skin daily. Pt taking 24units-reported on 09/18/22, Disp: , Rfl:    levocetirizine (XYZAL) 5 MG tablet, Take 5 mg by mouth every evening. , Disp: , Rfl:    levothyroxine (SYNTHROID) 50 MCG tablet, Take 1 tablet (50 mcg total) by mouth daily., Disp: 90 tablet, Rfl: 3   lidocaine (LIDODERM) 5 %, Place 3 patches onto the skin daily. May use 1-3 patches at one time, Remove & Discard patch within 12 hours or as directed by MD, Disp: 90 patch, Rfl: 0   losartan (COZAAR) 50 MG tablet, Take 50 mg by mouth 2 (two) times daily. , Disp: , Rfl:    Magnesium 250 MG TABS, Take 1 tablet by mouth daily., Disp: , Rfl:    methocarbamol (ROBAXIN) 750 MG tablet, Take 750 mg by mouth 3 (three) times daily as needed for muscle spasms., Disp: , Rfl:    mirabegron ER (MYRBETRIQ) 50 MG TB24 tablet, Take 50 mg by mouth daily., Disp: , Rfl:    montelukast (SINGULAIR) 10 MG tablet, Take 10 mg by mouth at bedtime., Disp: , Rfl:    Multiple Vitamin (MULTIVITAMIN) tablet, Take 1 tablet by mouth daily., Disp: , Rfl:    nitroGLYCERIN (NITROSTAT) 0.4 MG SL tablet, Place 1 tablet (0.4 mg total) under the tongue every 5 (five) minutes as needed for chest pain., Disp: 30 tablet, Rfl: 2   Omega-3 Fatty Acids (FISH OIL) 1200 MG CAPS, Take 1,200 mg by mouth. , Disp: , Rfl:    pantoprazole (PROTONIX) 40 MG tablet, TAKE 1 TABLET DAILY,  Disp: 90 tablet, Rfl: 3   pramipexole (MIRAPEX) 0.5 MG tablet, Take 2 tablets (1 mg total) by mouth in the morning and at bedtime. (Patient taking differently: Take 1 mg by mouth in the morning and at bedtime. One in the morning one in the afternoon and 2 at night.), Disp: 90 tablet, Rfl: 1   rivaroxaban (XARELTO) 10 MG TABS tablet,  Take 10 mg by mouth daily. Take one tablet (15 mg dose) by mouth daily. Take with Dinner., Disp: , Rfl:    solifenacin (VESICARE) 5 MG tablet, Take 1 tablet (5 mg total) by mouth daily., Disp: 90 tablet, Rfl: 1   vitamin B-12 (CYANOCOBALAMIN) 1000 MCG tablet, Take 1,000 mcg by mouth daily., Disp: , Rfl:    WIXELA INHUB 100-50 MCG/ACT AEPB, Inhale 1 puff into the lungs 2 (two) times daily., Disp: 60 each, Rfl: 1  Current Facility-Administered Medications:    cyanocobalamin ((VITAMIN B-12)) injection 1,000 mcg, 1,000 mcg, Intramuscular, Once, [name], MD   Allergies  Allergen Reactions   No Known Allergies      Review of Systems  Constitutional: Negative.   HENT: Negative.    Eyes: Negative.   Respiratory: Negative.    Musculoskeletal:  Positive for back pain and gait problem.  Skin:  Positive for color change.  Psychiatric/Behavioral: Negative.       Today's Vitals   11/20/22 1019  BP: 120/64  Pulse: 60  Temp: 97.8 F (36.6 C)  Weight: 187 lb (84.8 kg)  Height: 5\' 11"  (1.803 m)  PainSc: 0-No pain   Body mass index is 26.08 kg/m.  Wt Readings from Last 3 Encounters:  11/20/22 187 lb (84.8 kg)  09/27/22 187 lb 9.6 oz (85.1 kg)  08/07/22 191 lb 6.4 oz (86.8 kg)     Objective:  Physical Exam Musculoskeletal:     Comments: Abrasion on bilateral hands  Skin:    Findings: Bruising present.  Neurological:     Mental Status: He is alert and oriented to person, place, and time.         Assessment And Plan:  Fall, subsequent encounter Assessment & Plan: Continue physical therapy   Abrasion of skin Assessment & Plan: Bilateral arms  healing after fall on 11/12/2022   Lumbar radiculopathy Assessment & Plan: Patient at [facility]   Hypoglycemia Assessment & Plan: Gvoke injection as needed  Orders: -     Gvoke HypoPen 2-Pack; Inject 0.5 mg into the skin as directed.  Dispense: 0.1 mL; Refill: 1     Return for Keep scheduled appt.  Patient was given opportunity to ask questions. Patient verbalized understanding of the plan and was able to repeat key elements of the plan. All questions were answered to their satisfaction.  Yzabelle Calles Moshe Salisbury, NP  I, Selmer Adduci Moshe Salisbury, NP, have reviewed all documentation for this visit. The documentation on 11/21/22 for the exam, diagnosis, procedures, and orders are all accurate and complete.   IF YOU HAVE BEEN REFERRED TO A SPECIALIST, IT MAY TAKE 1-2 WEEKS TO SCHEDULE/PROCESS THE REFERRAL. IF YOU HAVE NOT HEARD FROM US/SPECIALIST IN TWO WEEKS, PLEASE GIVE [place] A CALL AT [phone].   THE PATIENT IS ENCOURAGED TO PRACTICE SOCIAL DISTANCING DUE TO THE COVID-19 PANDEMIC.

## 2022-11-21 DIAGNOSIS — T148XXA Other injury of unspecified body region, initial encounter: Secondary | ICD-10-CM | POA: Insufficient documentation

## 2022-11-21 DIAGNOSIS — J301 Allergic rhinitis due to pollen: Secondary | ICD-10-CM | POA: Diagnosis not present

## 2022-11-21 DIAGNOSIS — E162 Hypoglycemia, unspecified: Secondary | ICD-10-CM | POA: Insufficient documentation

## 2022-11-21 DIAGNOSIS — R918 Other nonspecific abnormal finding of lung field: Secondary | ICD-10-CM | POA: Diagnosis not present

## 2022-11-21 DIAGNOSIS — G2581 Restless legs syndrome: Secondary | ICD-10-CM | POA: Diagnosis not present

## 2022-11-21 DIAGNOSIS — J4520 Mild intermittent asthma, uncomplicated: Secondary | ICD-10-CM | POA: Diagnosis not present

## 2022-11-21 DIAGNOSIS — G4736 Sleep related hypoventilation in conditions classified elsewhere: Secondary | ICD-10-CM | POA: Diagnosis not present

## 2022-11-21 NOTE — Assessment & Plan Note (Signed)
Patient at [facility]

## 2022-11-21 NOTE — Assessment & Plan Note (Signed)
Gvoke injection as needed

## 2022-11-21 NOTE — Assessment & Plan Note (Signed)
Bilateral arms healing after fall on 11/12/2022

## 2022-11-21 NOTE — Assessment & Plan Note (Signed)
Continue physical therapy

## 2022-11-22 DIAGNOSIS — M6208 Separation of muscle (nontraumatic), other site: Secondary | ICD-10-CM | POA: Diagnosis not present

## 2022-11-27 DIAGNOSIS — I2510 Atherosclerotic heart disease of native coronary artery without angina pectoris: Secondary | ICD-10-CM | POA: Diagnosis not present

## 2022-11-27 DIAGNOSIS — E782 Mixed hyperlipidemia: Secondary | ICD-10-CM | POA: Diagnosis not present

## 2022-11-27 DIAGNOSIS — I10 Essential (primary) hypertension: Secondary | ICD-10-CM | POA: Diagnosis not present

## 2022-11-27 DIAGNOSIS — I480 Paroxysmal atrial fibrillation: Secondary | ICD-10-CM | POA: Diagnosis not present

## 2022-11-28 ENCOUNTER — Other Ambulatory Visit: Payer: Self-pay | Admitting: Internal Medicine

## 2022-11-29 DIAGNOSIS — M419 Scoliosis, unspecified: Secondary | ICD-10-CM | POA: Diagnosis not present

## 2022-11-29 DIAGNOSIS — M62830 Muscle spasm of back: Secondary | ICD-10-CM | POA: Diagnosis not present

## 2022-12-05 DIAGNOSIS — M7918 Myalgia, other site: Secondary | ICD-10-CM | POA: Diagnosis not present

## 2022-12-05 DIAGNOSIS — G2581 Restless legs syndrome: Secondary | ICD-10-CM | POA: Diagnosis not present

## 2022-12-05 DIAGNOSIS — Z79899 Other long term (current) drug therapy: Secondary | ICD-10-CM | POA: Diagnosis not present

## 2022-12-05 DIAGNOSIS — M7912 Myalgia of auxiliary muscles, head and neck: Secondary | ICD-10-CM | POA: Diagnosis not present

## 2022-12-06 ENCOUNTER — Telehealth: Payer: Self-pay | Admitting: Family Medicine

## 2022-12-06 NOTE — Telephone Encounter (Signed)
Was notified by answering service that patient called to state that he mistakenly administered the "wrong insulin" and his BS dropped from 280 to 240 and so he needed to report to someone. Called patient on [phone] and [phone] about six times no response. Left a message to use G-voke if BS drops below 70

## 2022-12-13 DIAGNOSIS — M2569 Stiffness of other specified joint, not elsewhere classified: Secondary | ICD-10-CM | POA: Diagnosis not present

## 2022-12-13 DIAGNOSIS — M5451 Vertebrogenic low back pain: Secondary | ICD-10-CM | POA: Diagnosis not present

## 2022-12-13 DIAGNOSIS — M546 Pain in thoracic spine: Secondary | ICD-10-CM | POA: Diagnosis not present

## 2022-12-13 DIAGNOSIS — Z7409 Other reduced mobility: Secondary | ICD-10-CM | POA: Diagnosis not present

## 2022-12-16 DIAGNOSIS — Z955 Presence of coronary angioplasty implant and graft: Secondary | ICD-10-CM | POA: Diagnosis not present

## 2022-12-16 DIAGNOSIS — G4733 Obstructive sleep apnea (adult) (pediatric): Secondary | ICD-10-CM | POA: Diagnosis not present

## 2022-12-16 DIAGNOSIS — Z8546 Personal history of malignant neoplasm of prostate: Secondary | ICD-10-CM | POA: Diagnosis not present

## 2022-12-16 DIAGNOSIS — Z01818 Encounter for other preprocedural examination: Secondary | ICD-10-CM | POA: Diagnosis not present

## 2022-12-16 DIAGNOSIS — Z01812 Encounter for preprocedural laboratory examination: Secondary | ICD-10-CM | POA: Diagnosis not present

## 2022-12-16 DIAGNOSIS — I480 Paroxysmal atrial fibrillation: Secondary | ICD-10-CM | POA: Diagnosis not present

## 2022-12-16 DIAGNOSIS — Z720 Tobacco use: Secondary | ICD-10-CM | POA: Diagnosis not present

## 2022-12-16 DIAGNOSIS — I2510 Atherosclerotic heart disease of native coronary artery without angina pectoris: Secondary | ICD-10-CM | POA: Diagnosis not present

## 2022-12-16 DIAGNOSIS — E119 Type 2 diabetes mellitus without complications: Secondary | ICD-10-CM | POA: Diagnosis not present

## 2022-12-16 DIAGNOSIS — E785 Hyperlipidemia, unspecified: Secondary | ICD-10-CM | POA: Diagnosis not present

## 2022-12-16 DIAGNOSIS — Z794 Long term (current) use of insulin: Secondary | ICD-10-CM | POA: Diagnosis not present

## 2022-12-16 DIAGNOSIS — M4186 Other forms of scoliosis, lumbar region: Secondary | ICD-10-CM | POA: Diagnosis not present

## 2022-12-16 DIAGNOSIS — I10 Essential (primary) hypertension: Secondary | ICD-10-CM | POA: Diagnosis not present

## 2022-12-16 DIAGNOSIS — M4156 Other secondary scoliosis, lumbar region: Secondary | ICD-10-CM | POA: Diagnosis not present

## 2022-12-16 DIAGNOSIS — G8929 Other chronic pain: Secondary | ICD-10-CM | POA: Diagnosis not present

## 2022-12-16 LAB — HEMOGLOBIN A1C: Hemoglobin A1C: 6.3

## 2022-12-20 DIAGNOSIS — I440 Atrioventricular block, first degree: Secondary | ICD-10-CM | POA: Diagnosis not present

## 2022-12-20 DIAGNOSIS — I517 Cardiomegaly: Secondary | ICD-10-CM | POA: Diagnosis not present

## 2022-12-20 DIAGNOSIS — R000 Tachycardia, unspecified: Secondary | ICD-10-CM | POA: Diagnosis not present

## 2022-12-21 DIAGNOSIS — R609 Edema, unspecified: Secondary | ICD-10-CM | POA: Diagnosis not present

## 2022-12-21 DIAGNOSIS — I4892 Unspecified atrial flutter: Secondary | ICD-10-CM | POA: Diagnosis not present

## 2022-12-21 DIAGNOSIS — R413 Other amnesia: Secondary | ICD-10-CM | POA: Diagnosis not present

## 2022-12-21 DIAGNOSIS — F17290 Nicotine dependence, other tobacco product, uncomplicated: Secondary | ICD-10-CM | POA: Diagnosis not present

## 2022-12-21 DIAGNOSIS — E669 Obesity, unspecified: Secondary | ICD-10-CM | POA: Diagnosis not present

## 2022-12-21 DIAGNOSIS — J984 Other disorders of lung: Secondary | ICD-10-CM | POA: Diagnosis not present

## 2022-12-21 DIAGNOSIS — E785 Hyperlipidemia, unspecified: Secondary | ICD-10-CM | POA: Diagnosis not present

## 2022-12-21 DIAGNOSIS — R6889 Other general symptoms and signs: Secondary | ICD-10-CM | POA: Diagnosis not present

## 2022-12-21 DIAGNOSIS — E039 Hypothyroidism, unspecified: Secondary | ICD-10-CM | POA: Diagnosis not present

## 2022-12-21 DIAGNOSIS — R42 Dizziness and giddiness: Secondary | ICD-10-CM | POA: Diagnosis not present

## 2022-12-21 DIAGNOSIS — Z86718 Personal history of other venous thrombosis and embolism: Secondary | ICD-10-CM | POA: Diagnosis not present

## 2022-12-21 DIAGNOSIS — I2510 Atherosclerotic heart disease of native coronary artery without angina pectoris: Secondary | ICD-10-CM | POA: Diagnosis not present

## 2022-12-21 DIAGNOSIS — R0989 Other specified symptoms and signs involving the circulatory and respiratory systems: Secondary | ICD-10-CM | POA: Diagnosis not present

## 2022-12-21 DIAGNOSIS — I083 Combined rheumatic disorders of mitral, aortic and tricuspid valves: Secondary | ICD-10-CM | POA: Diagnosis not present

## 2022-12-21 DIAGNOSIS — R2689 Other abnormalities of gait and mobility: Secondary | ICD-10-CM | POA: Diagnosis not present

## 2022-12-21 DIAGNOSIS — E119 Type 2 diabetes mellitus without complications: Secondary | ICD-10-CM | POA: Diagnosis not present

## 2022-12-21 DIAGNOSIS — F329 Major depressive disorder, single episode, unspecified: Secondary | ICD-10-CM | POA: Diagnosis not present

## 2022-12-21 DIAGNOSIS — I480 Paroxysmal atrial fibrillation: Secondary | ICD-10-CM | POA: Diagnosis not present

## 2022-12-21 DIAGNOSIS — I959 Hypotension, unspecified: Secondary | ICD-10-CM | POA: Diagnosis not present

## 2022-12-21 DIAGNOSIS — Z79899 Other long term (current) drug therapy: Secondary | ICD-10-CM | POA: Diagnosis not present

## 2022-12-21 DIAGNOSIS — I10 Essential (primary) hypertension: Secondary | ICD-10-CM | POA: Diagnosis not present

## 2022-12-21 DIAGNOSIS — Z794 Long term (current) use of insulin: Secondary | ICD-10-CM | POA: Diagnosis not present

## 2022-12-21 DIAGNOSIS — G4733 Obstructive sleep apnea (adult) (pediatric): Secondary | ICD-10-CM | POA: Diagnosis not present

## 2022-12-21 DIAGNOSIS — I517 Cardiomegaly: Secondary | ICD-10-CM | POA: Diagnosis not present

## 2022-12-21 DIAGNOSIS — F419 Anxiety disorder, unspecified: Secondary | ICD-10-CM | POA: Diagnosis not present

## 2022-12-21 DIAGNOSIS — Z7984 Long term (current) use of oral hypoglycemic drugs: Secondary | ICD-10-CM | POA: Diagnosis not present

## 2022-12-21 DIAGNOSIS — Z7982 Long term (current) use of aspirin: Secondary | ICD-10-CM | POA: Diagnosis not present

## 2022-12-21 DIAGNOSIS — Z6825 Body mass index (BMI) 25.0-25.9, adult: Secondary | ICD-10-CM | POA: Diagnosis not present

## 2022-12-21 DIAGNOSIS — E860 Dehydration: Secondary | ICD-10-CM | POA: Diagnosis not present

## 2022-12-24 DIAGNOSIS — M5451 Vertebrogenic low back pain: Secondary | ICD-10-CM | POA: Diagnosis not present

## 2022-12-24 DIAGNOSIS — M546 Pain in thoracic spine: Secondary | ICD-10-CM | POA: Diagnosis not present

## 2022-12-24 DIAGNOSIS — Z7409 Other reduced mobility: Secondary | ICD-10-CM | POA: Diagnosis not present

## 2022-12-24 DIAGNOSIS — M2569 Stiffness of other specified joint, not elsewhere classified: Secondary | ICD-10-CM | POA: Diagnosis not present

## 2022-12-25 DIAGNOSIS — I2510 Atherosclerotic heart disease of native coronary artery without angina pectoris: Secondary | ICD-10-CM | POA: Diagnosis not present

## 2022-12-25 DIAGNOSIS — E782 Mixed hyperlipidemia: Secondary | ICD-10-CM | POA: Diagnosis not present

## 2022-12-25 DIAGNOSIS — I10 Essential (primary) hypertension: Secondary | ICD-10-CM | POA: Diagnosis not present

## 2022-12-25 DIAGNOSIS — Z01818 Encounter for other preprocedural examination: Secondary | ICD-10-CM | POA: Diagnosis not present

## 2022-12-25 DIAGNOSIS — I480 Paroxysmal atrial fibrillation: Secondary | ICD-10-CM | POA: Diagnosis not present

## 2022-12-30 ENCOUNTER — Other Ambulatory Visit: Payer: Self-pay | Admitting: Internal Medicine

## 2022-12-30 DIAGNOSIS — M1990 Unspecified osteoarthritis, unspecified site: Secondary | ICD-10-CM | POA: Diagnosis present

## 2022-12-30 DIAGNOSIS — F32A Depression, unspecified: Secondary | ICD-10-CM | POA: Diagnosis present

## 2022-12-30 DIAGNOSIS — M5136 Other intervertebral disc degeneration, lumbar region: Secondary | ICD-10-CM | POA: Diagnosis present

## 2022-12-30 DIAGNOSIS — G40109 Localization-related (focal) (partial) symptomatic epilepsy and epileptic syndromes with simple partial seizures, not intractable, without status epilepticus: Secondary | ICD-10-CM | POA: Diagnosis present

## 2022-12-30 DIAGNOSIS — E1122 Type 2 diabetes mellitus with diabetic chronic kidney disease: Secondary | ICD-10-CM | POA: Diagnosis present

## 2022-12-30 DIAGNOSIS — I480 Paroxysmal atrial fibrillation: Secondary | ICD-10-CM | POA: Diagnosis present

## 2022-12-30 DIAGNOSIS — M4156 Other secondary scoliosis, lumbar region: Secondary | ICD-10-CM | POA: Diagnosis not present

## 2022-12-30 DIAGNOSIS — F17290 Nicotine dependence, other tobacco product, uncomplicated: Secondary | ICD-10-CM | POA: Diagnosis present

## 2022-12-30 DIAGNOSIS — J45909 Unspecified asthma, uncomplicated: Secondary | ICD-10-CM | POA: Diagnosis present

## 2022-12-30 DIAGNOSIS — F419 Anxiety disorder, unspecified: Secondary | ICD-10-CM | POA: Diagnosis present

## 2022-12-30 DIAGNOSIS — Z86718 Personal history of other venous thrombosis and embolism: Secondary | ICD-10-CM | POA: Diagnosis not present

## 2022-12-30 DIAGNOSIS — Z8546 Personal history of malignant neoplasm of prostate: Secondary | ICD-10-CM | POA: Diagnosis not present

## 2022-12-30 DIAGNOSIS — E039 Hypothyroidism, unspecified: Secondary | ICD-10-CM | POA: Diagnosis present

## 2022-12-30 DIAGNOSIS — I110 Hypertensive heart disease with heart failure: Secondary | ICD-10-CM | POA: Diagnosis not present

## 2022-12-30 DIAGNOSIS — I129 Hypertensive chronic kidney disease with stage 1 through stage 4 chronic kidney disease, or unspecified chronic kidney disease: Secondary | ICD-10-CM | POA: Diagnosis present

## 2022-12-30 DIAGNOSIS — E785 Hyperlipidemia, unspecified: Secondary | ICD-10-CM | POA: Diagnosis present

## 2022-12-30 DIAGNOSIS — E782 Mixed hyperlipidemia: Secondary | ICD-10-CM | POA: Diagnosis not present

## 2022-12-30 DIAGNOSIS — F4310 Post-traumatic stress disorder, unspecified: Secondary | ICD-10-CM | POA: Diagnosis present

## 2022-12-30 DIAGNOSIS — M47816 Spondylosis without myelopathy or radiculopathy, lumbar region: Secondary | ICD-10-CM | POA: Diagnosis present

## 2022-12-30 DIAGNOSIS — G4733 Obstructive sleep apnea (adult) (pediatric): Secondary | ICD-10-CM | POA: Diagnosis present

## 2022-12-30 DIAGNOSIS — N182 Chronic kidney disease, stage 2 (mild): Secondary | ICD-10-CM | POA: Diagnosis present

## 2022-12-30 DIAGNOSIS — G2581 Restless legs syndrome: Secondary | ICD-10-CM | POA: Diagnosis present

## 2022-12-30 DIAGNOSIS — M4186 Other forms of scoliosis, lumbar region: Secondary | ICD-10-CM | POA: Diagnosis present

## 2022-12-30 DIAGNOSIS — Z955 Presence of coronary angioplasty implant and graft: Secondary | ICD-10-CM | POA: Diagnosis not present

## 2022-12-30 DIAGNOSIS — K219 Gastro-esophageal reflux disease without esophagitis: Secondary | ICD-10-CM | POA: Diagnosis present

## 2022-12-30 DIAGNOSIS — R413 Other amnesia: Secondary | ICD-10-CM | POA: Diagnosis present

## 2022-12-30 DIAGNOSIS — Z95818 Presence of other cardiac implants and grafts: Secondary | ICD-10-CM | POA: Diagnosis not present

## 2022-12-30 DIAGNOSIS — Z01818 Encounter for other preprocedural examination: Secondary | ICD-10-CM | POA: Diagnosis not present

## 2022-12-30 DIAGNOSIS — I2510 Atherosclerotic heart disease of native coronary artery without angina pectoris: Secondary | ICD-10-CM | POA: Diagnosis not present

## 2023-01-02 DIAGNOSIS — Z8546 Personal history of malignant neoplasm of prostate: Secondary | ICD-10-CM | POA: Diagnosis not present

## 2023-01-02 DIAGNOSIS — E785 Hyperlipidemia, unspecified: Secondary | ICD-10-CM | POA: Diagnosis present

## 2023-01-02 DIAGNOSIS — E039 Hypothyroidism, unspecified: Secondary | ICD-10-CM | POA: Diagnosis present

## 2023-01-02 DIAGNOSIS — M4156 Other secondary scoliosis, lumbar region: Secondary | ICD-10-CM | POA: Diagnosis not present

## 2023-01-02 DIAGNOSIS — G4733 Obstructive sleep apnea (adult) (pediatric): Secondary | ICD-10-CM | POA: Diagnosis present

## 2023-01-02 DIAGNOSIS — N182 Chronic kidney disease, stage 2 (mild): Secondary | ICD-10-CM | POA: Diagnosis present

## 2023-01-02 DIAGNOSIS — F17290 Nicotine dependence, other tobacco product, uncomplicated: Secondary | ICD-10-CM | POA: Diagnosis present

## 2023-01-02 DIAGNOSIS — R413 Other amnesia: Secondary | ICD-10-CM | POA: Diagnosis present

## 2023-01-02 DIAGNOSIS — F32A Depression, unspecified: Secondary | ICD-10-CM | POA: Diagnosis present

## 2023-01-02 DIAGNOSIS — J45909 Unspecified asthma, uncomplicated: Secondary | ICD-10-CM | POA: Diagnosis present

## 2023-01-02 DIAGNOSIS — I129 Hypertensive chronic kidney disease with stage 1 through stage 4 chronic kidney disease, or unspecified chronic kidney disease: Secondary | ICD-10-CM | POA: Diagnosis present

## 2023-01-02 DIAGNOSIS — F419 Anxiety disorder, unspecified: Secondary | ICD-10-CM | POA: Diagnosis present

## 2023-01-02 DIAGNOSIS — G2581 Restless legs syndrome: Secondary | ICD-10-CM | POA: Diagnosis present

## 2023-01-02 DIAGNOSIS — Z955 Presence of coronary angioplasty implant and graft: Secondary | ICD-10-CM | POA: Diagnosis not present

## 2023-01-02 DIAGNOSIS — M1990 Unspecified osteoarthritis, unspecified site: Secondary | ICD-10-CM | POA: Diagnosis present

## 2023-01-02 DIAGNOSIS — Z95818 Presence of other cardiac implants and grafts: Secondary | ICD-10-CM | POA: Diagnosis not present

## 2023-01-02 DIAGNOSIS — M47816 Spondylosis without myelopathy or radiculopathy, lumbar region: Secondary | ICD-10-CM | POA: Diagnosis present

## 2023-01-02 DIAGNOSIS — Z86718 Personal history of other venous thrombosis and embolism: Secondary | ICD-10-CM | POA: Diagnosis not present

## 2023-01-02 DIAGNOSIS — M5136 Other intervertebral disc degeneration, lumbar region: Secondary | ICD-10-CM | POA: Diagnosis present

## 2023-01-02 DIAGNOSIS — G40109 Localization-related (focal) (partial) symptomatic epilepsy and epileptic syndromes with simple partial seizures, not intractable, without status epilepticus: Secondary | ICD-10-CM | POA: Diagnosis present

## 2023-01-02 DIAGNOSIS — F4310 Post-traumatic stress disorder, unspecified: Secondary | ICD-10-CM | POA: Diagnosis present

## 2023-01-02 DIAGNOSIS — K219 Gastro-esophageal reflux disease without esophagitis: Secondary | ICD-10-CM | POA: Diagnosis present

## 2023-01-02 DIAGNOSIS — I480 Paroxysmal atrial fibrillation: Secondary | ICD-10-CM | POA: Diagnosis present

## 2023-01-02 DIAGNOSIS — E1122 Type 2 diabetes mellitus with diabetic chronic kidney disease: Secondary | ICD-10-CM | POA: Diagnosis present

## 2023-01-02 DIAGNOSIS — M4186 Other forms of scoliosis, lumbar region: Secondary | ICD-10-CM | POA: Diagnosis present

## 2023-01-02 HISTORY — PX: OTHER SURGICAL HISTORY: SHX169

## 2023-01-03 ENCOUNTER — Other Ambulatory Visit: Payer: Self-pay

## 2023-01-07 ENCOUNTER — Telehealth: Payer: Self-pay

## 2023-01-07 NOTE — Transitions of Care (Post Inpatient/ED Visit) (Signed)
01/07/2023  Name: Franklin Thornton MRN: [id number] DOB: 1945/11/21  Today's TOC FU Call Status: Today's TOC FU Call Status:: Successful TOC FU Call Completed TOC FU Call Complete Date: 01/07/23 Patient's Name and Date of Birth confirmed.  Transition Care Management Follow-up Telephone Call Date of Discharge: 01/05/23 Discharge Facility: Other [facility]) Name of Other (Non[facility]) Discharge Facility: [facility] Type of Discharge: Inpatient Admission Primary Inpatient Discharge Diagnosis:: "degenerative scoliosis" How have you been since you were released from the hospital?: Better (Pt voices doing ok-"trying to amange pain-current pain 5/10-states he is takign Oxycodone. Pt groggy during call. Surgical incision area with no issues per pt/wife report.Appetite fair.) Any questions or concerns?: No  Items Reviewed: Did you receive and understand the discharge instructions provided?: Yes Medications obtained,verified, and reconciled?: No Medications Not Reviewed Reasons:: Other: (pt groggy during call-unable to complete med review) Any new allergies since your discharge?: No Dietary orders reviewed?: Yes Type of Diet Ordered:: low salt/heart healthy Do you have support at home?: Yes People in Home: spouse Name of Support/Comfort Primary Source: Dorothy  Medications Reviewed Today: Medications Reviewed Today     Reviewed by Charlyn Minerva, RN (Registered Nurse) on 01/07/23 at 1124  Med List Status: <None>   Medication Order Taking? Sig Documenting Provider Last Dose Status Informant  albuterol (PROVENTIL HFA;VENTOLIN HFA) 108 (90 Base) MCG/ACT inhaler [id number] No Inhale 2 puffs into the lungs every 6 (six) hours as needed for wheezing or shortness of breath. [provider] Taking Active   albuterol (PROVENTIL) (2.5 MG/3ML) 0.083% nebulizer solution [id number] No Take 3 mLs (2.5 mg total) by nebulization every 6 (six) hours as needed for wheezing or shortness of  breath. Arnette Felts, FNP Taking Active   amiodarone (PACERONE) 100 MG tablet [id number] No Take 100 mg by mouth daily.  [provider] Taking Active   amphetamine-dextroamphetamine (ADDERALL) 10 MG tablet [id number] No Take 1 tablet by mouth daily. Take 2 tablets in the morning and two tablets in the evenings. [provider] Taking Active Self  Armodafinil 250 MG tablet [id number] No Take 125-250 mg by mouth every morning. [provider] Taking Active   aspirin 81 MG tablet [id number] No Take 81 mg by mouth daily. [provider] Taking Active   atorvastatin (LIPITOR) 40 MG tablet [id number]  TAKE 1 TABLET DAILY [name], MD  Active   azelastine (ASTELIN) 0.1 % nasal spray [id number] No Place 1 spray into both nostrils 2 (two) times daily. Use in each nostril as directed [name], MD Taking Active   b complex vitamins capsule [id number] No Take 1 capsule by mouth daily. [provider] Taking Active   carvedilol (COREG) 6.25 MG tablet [id number] No Take 6.25 mg by mouth 2 (two) times daily with a meal. [provider] Taking Active   Cholecalciferol (VITAMIN D) 2000 units tablet [id number] No Take 2,000 Units by mouth daily. [provider] Taking Active   cyanocobalamin ((VITAMIN B-12)) injection 1,000 mcg [id number]   [name], MD  Active   diphenhydrAMINE (BENADRYL) 25 MG tablet [id number] No Take 25 mg by mouth every 6 (six) hours as needed for itching.  [provider] Taking Active   donepezil (ARICEPT) 10 MG tablet [id number] No TAKE 1 TABLET AT BEDTIME [name], MD Taking Active   DULoxetine (CYMBALTA) 30 MG capsule [id number]  TAKE 1 CAPSULE DAILY IN THE Josephine Igo, MD  Active   empagliflozin (JARDIANCE) 10 MG TABS tablet [id number] No Take 1 tablet (  10 mg total) by mouth daily.  Patient taking differently: Take 10 mg by mouth daily. Pt states taking 25mg    [name], MD Taking Active Self   furosemide (LASIX) 20 MG tablet [id number] No Take 20 mg by mouth daily as needed.  [provider] Taking Active   Glucagon (GVOKE HYPOPEN 2-PACK) 0.5 MG/0.1ML SOAJ [id number]  Inject 0.5 mg into the skin as directed. Ellender Hose, NP  Active   glucose blood (FREESTYLE LITE) test strip [id number] No Use as instructed to check blood sugars  3 times per day dx: e11.65 [name], MD Taking Active   glucose monitoring kit (FREESTYLE) monitoring kit [id number] No 1 each by Does not apply route as needed for other. [provider] Taking Active   insulin lispro (HUMALOG KWIKPEN) 100 UNIT/ML KwikPen [id number] No 3 Units daily as needed. Inject 3 units for every 50 points over 200 if BG > 200 before meals. Max daily dose: 20 units. [provider] Taking Active Self  Insulin Pen Needle (NOVOFINE PEN NEEDLE) 32G X 6 MM MISC [id number] No Use with insulin pens [name], MD Taking Active   ipratropium (ATROVENT) 0.03 % nasal spray [id number] No Place 2 sprays into both nostrils every 12 (twelve) hours. [provider] Taking Active   LANTUS SOLOSTAR 100 UNIT/ML Solostar Pen [id number] No Inject 24 Units into the skin daily. Pt taking 24units-reported on 09/18/22 [provider] Taking Active Self  levocetirizine (XYZAL) 5 MG tablet [id number] No Take 5 mg by mouth every evening.  [provider] Taking Active   levothyroxine (SYNTHROID) 50 MCG tablet [id number] No Take 1 tablet (50 mcg total) by mouth daily. [name], MD Taking Active   lidocaine (LIDODERM) 5 % [id number] No Place 3 patches onto the skin daily. May use 1-3 patches at one time, Remove & Discard patch within 12 hours or as directed by MD [name], MD Taking Active   losartan (COZAAR) 50 MG tablet [id number] No Take 50 mg by mouth 2 (two) times daily.  [provider] Taking Active   Magnesium 250 MG TABS [id number] No Take 1 tablet by mouth daily. [provider]  Taking Active   methocarbamol (ROBAXIN) 750 MG tablet [id number] No Take 750 mg by mouth 3 (three) times daily as needed for muscle spasms. [provider] Taking Active   mirabegron ER (MYRBETRIQ) 50 MG TB24 tablet [id number] No Take 50 mg by mouth daily. [provider] Taking Active   montelukast (SINGULAIR) 10 MG tablet [id number] No Take 10 mg by mouth at bedtime. [provider] Taking Active   Multiple Vitamin (MULTIVITAMIN) tablet [id number] No Take 1 tablet by mouth daily. [provider] Taking Active   nitroGLYCERIN (NITROSTAT) 0.4 MG SL tablet [id number] No Place 1 tablet (0.4 mg total) under the tongue every 5 (five) minutes as needed for chest pain. [name], MD Taking Active   Omega-3 Fatty Acids (FISH OIL) 1200 MG CAPS [id number] No Take 1,200 mg by mouth.  [provider] Taking Active   pantoprazole (PROTONIX) 40 MG tablet [id number] No TAKE 1 TABLET DAILY [name], MD Taking Active   pramipexole (MIRAPEX) 0.5 MG tablet [id number] No Take 2 tablets (1 mg total) by mouth in the morning and at bedtime.  Patient taking differently: Take 1 mg by mouth in the morning and at bedtime. One in the morning one in the afternoon and 2 at night.   [name], MD Taking Active   rivaroxaban Carlena Hurl)  10 MG TABS tablet [id number] No Take 10 mg by mouth daily. Take one tablet (15 mg dose) by mouth daily. Take with Dinner. [provider] Taking Active Self  solifenacin (VESICARE) 5 MG tablet [id number] No Take 1 tablet (5 mg total) by mouth daily. [name], MD Taking Active   vitamin B-12 (CYANOCOBALAMIN) 1000 MCG tablet [id number] No Take 1,000 mcg by mouth daily. [provider] Taking Active            Med Note Harlan Stains   Tue Mar 20, 2021  2:43 PM)    Monte Fantasia INHUB 100-50 MCG/ACT AEPB [id number] No Inhale 1 puff into the lungs 2 (two) times daily. Arnette Felts, FNP Taking Active   Med List Note Allyne Gee, Melina Schools, MD  12/06/20 1448): NOrco prescribed by the [facility] Care and Equipment/Supplies: Were Home Health Services Ordered?: NA Any new equipment or medical supplies ordered?: NA  Functional Questionnaire: Do you need assistance with bathing/showering or dressing?: No Do you need assistance with meal preparation?: No Do you need assistance with eating?: No Do you have difficulty maintaining continence: No Do you need assistance with getting out of bed/getting out of a chair/moving?: No Do you have difficulty managing or taking your medications?: No  Follow up appointments reviewed: PCP Follow-up appointment confirmed?: Yes Date of PCP follow-up appointment?: 01/16/23 Follow-up Provider: Dr. Allyne Gee Specialist [facility] Follow-up appointment confirmed?: Yes Date of Specialist follow-up appointment?: 01/07/23 Follow-Up Specialty Provider:: Dr. Toniann Fail Do you need transportation to your follow-up appointment?: No (pt confirms spouse able to drive him to appts) Do you understand care options if your condition(s) worsen?: Yes-patient verbalized understanding  SDOH Interventions Today    Flowsheet Row Most Recent Value  SDOH Interventions   Food Insecurity Interventions Intervention Not Indicated  Transportation Interventions Intervention Not Indicated      TOC Interventions Today    Flowsheet Row Most Recent Value  TOC Interventions   TOC Interventions Discussed/Reviewed TOC Interventions Discussed, Post op wound/incision care, S/S of infection, Post discharge activity limitations per provider, Arranged PCP follow up less than 12 days/Care Guide scheduled      Interventions Today    Flowsheet Row Most Recent Value  General Interventions   General Interventions Discussed/Reviewed General Interventions Discussed, Doctor Visits  Doctor Visits Discussed/Reviewed Doctor Visits Discussed, PCP, Specialist  PCP/Specialist Visits Compliance with follow-up visit  Education  Interventions   Education Provided Provided Education  Provided Verbal Education On Foot Care, Other, When to see the doctor  [pain mgmt, bowel mgmt]  Nutrition Interventions   Nutrition Discussed/Reviewed Nutrition Discussed  Pharmacy Interventions   Pharmacy Dicussed/Reviewed Pharmacy Topics Discussed, Medications and their functions  Safety Interventions   Safety Discussed/Reviewed Safety Discussed, Home Safety       Alessandra Grout [facility] Care Management Care Management Community Coordinator Direct Phone: [phone] Toll Free: [phone] Fax: [phone]

## 2023-01-13 DIAGNOSIS — I4891 Unspecified atrial fibrillation: Secondary | ICD-10-CM | POA: Diagnosis not present

## 2023-01-13 NOTE — Progress Notes (Deleted)
I,Xan Sparkman T Deloria Lair, CMA,acting as a Neurosurgeon for Gwynneth Aliment, MD.,have documented all relevant documentation on the behalf of Gwynneth Aliment, MD,as directed by  Gwynneth Aliment, MD while in the presence of Gwynneth Aliment, MD.  Subjective:  Patient ID: Franklin Thornton , male    DOB: 11/16/1945 , 77 y.o.   MRN: [id number]  No chief complaint on file.   HPI  Patient presents today for hospital follow up.      Past Medical History:  Diagnosis Date   Atrial fibrillation (HCC)    Diabetes mellitus without complication (HCC)    Peripheral vascular disease (HCC)    Prostate cancer (HCC)    Uvular edema 07/17/2015     Family History  Problem Relation Age of Onset   Lung cancer Mother    Diabetes Mother    Esophageal cancer Father    Hypertension Sister    Breast cancer Sister    Healthy Son    Healthy Daughter      Current Outpatient Medications:    albuterol (PROVENTIL HFA;VENTOLIN HFA) 108 (90 Base) MCG/ACT inhaler, Inhale 2 puffs into the lungs every 6 (six) hours as needed for wheezing or shortness of breath., Disp: , Rfl:    albuterol (PROVENTIL) (2.5 MG/3ML) 0.083% nebulizer solution, Take 3 mLs (2.5 mg total) by nebulization every 6 (six) hours as needed for wheezing or shortness of breath., Disp: 75 mL, Rfl: 3   amiodarone (PACERONE) 100 MG tablet, Take 100 mg by mouth daily. , Disp: , Rfl:    amphetamine-dextroamphetamine (ADDERALL) 10 MG tablet, Take 1 tablet by mouth daily. Take 2 tablets in the morning and two tablets in the evenings., Disp: , Rfl:    Armodafinil 250 MG tablet, Take 125-250 mg by mouth every morning., Disp: , Rfl:    aspirin 81 MG tablet, Take 81 mg by mouth daily., Disp: , Rfl:    atorvastatin (LIPITOR) 40 MG tablet, TAKE 1 TABLET DAILY, Disp: 90 tablet, Rfl: 3   azelastine (ASTELIN) 0.1 % nasal spray, Place 1 spray into both nostrils 2 (two) times daily. Use in each nostril as directed, Disp: 30 mL, Rfl: 12   b complex vitamins capsule, Take 1  capsule by mouth daily., Disp: , Rfl:    carvedilol (COREG) 6.25 MG tablet, Take 6.25 mg by mouth 2 (two) times daily with a meal., Disp: , Rfl:    Cholecalciferol (VITAMIN D) 2000 units tablet, Take 2,000 Units by mouth daily., Disp: , Rfl:    diphenhydrAMINE (BENADRYL) 25 MG tablet, Take 25 mg by mouth every 6 (six) hours as needed for itching. , Disp: , Rfl:    donepezil (ARICEPT) 10 MG tablet, TAKE 1 TABLET AT BEDTIME, Disp: 90 tablet, Rfl: 3   DULoxetine (CYMBALTA) 30 MG capsule, TAKE 1 CAPSULE DAILY IN THE EVENING, Disp: 90 capsule, Rfl: 3   empagliflozin (JARDIANCE) 10 MG TABS tablet, Take 1 tablet (10 mg total) by mouth daily. (Patient taking differently: Take 10 mg by mouth daily. Pt states taking 25mg ), Disp: 90 tablet, Rfl: 3   furosemide (LASIX) 20 MG tablet, Take 20 mg by mouth daily as needed. , Disp: , Rfl:    Glucagon (GVOKE HYPOPEN 2-PACK) 0.5 MG/0.1ML SOAJ, Inject 0.5 mg into the skin as directed., Disp: 0.1 mL, Rfl: 1   glucose blood (FREESTYLE LITE) test strip, Use as instructed to check blood sugars  3 times per day dx: e11.65, Disp: 300 each, Rfl: 1   glucose monitoring kit (  FREESTYLE) monitoring kit, 1 each by Does not apply route as needed for other., Disp: , Rfl:    insulin lispro (HUMALOG KWIKPEN) 100 UNIT/ML KwikPen, 3 Units daily as needed. Inject 3 units for every 50 points over 200 if BG > 200 before meals. Max daily dose: 20 units., Disp: , Rfl:    Insulin Pen Needle (NOVOFINE PEN NEEDLE) 32G X 6 MM MISC, Use with insulin pens, Disp: 300 each, Rfl: 3   ipratropium (ATROVENT) 0.03 % nasal spray, Place 2 sprays into both nostrils every 12 (twelve) hours., Disp: , Rfl:    LANTUS SOLOSTAR 100 UNIT/ML Solostar Pen, Inject 24 Units into the skin daily. Pt taking 24units-reported on 09/18/22, Disp: , Rfl:    levocetirizine (XYZAL) 5 MG tablet, Take 5 mg by mouth every evening. , Disp: , Rfl:    levothyroxine (SYNTHROID) 50 MCG tablet, Take 1 tablet (50 mcg total) by mouth  daily., Disp: 90 tablet, Rfl: 3   lidocaine (LIDODERM) 5 %, Place 3 patches onto the skin daily. May use 1-3 patches at one time, Remove & Discard patch within 12 hours or as directed by MD, Disp: 90 patch, Rfl: 0   losartan (COZAAR) 50 MG tablet, Take 50 mg by mouth 2 (two) times daily. , Disp: , Rfl:    Magnesium 250 MG TABS, Take 1 tablet by mouth daily., Disp: , Rfl:    methocarbamol (ROBAXIN) 750 MG tablet, Take 750 mg by mouth 3 (three) times daily as needed for muscle spasms., Disp: , Rfl:    mirabegron ER (MYRBETRIQ) 50 MG TB24 tablet, Take 50 mg by mouth daily., Disp: , Rfl:    montelukast (SINGULAIR) 10 MG tablet, Take 10 mg by mouth at bedtime., Disp: , Rfl:    Multiple Vitamin (MULTIVITAMIN) tablet, Take 1 tablet by mouth daily., Disp: , Rfl:    nitroGLYCERIN (NITROSTAT) 0.4 MG SL tablet, Place 1 tablet (0.4 mg total) under the tongue every 5 (five) minutes as needed for chest pain., Disp: 30 tablet, Rfl: 2   Omega-3 Fatty Acids (FISH OIL) 1200 MG CAPS, Take 1,200 mg by mouth. , Disp: , Rfl:    pantoprazole (PROTONIX) 40 MG tablet, TAKE 1 TABLET DAILY, Disp: 90 tablet, Rfl: 3   pramipexole (MIRAPEX) 0.5 MG tablet, Take 2 tablets (1 mg total) by mouth in the morning and at bedtime. (Patient taking differently: Take 1 mg by mouth in the morning and at bedtime. One in the morning one in the afternoon and 2 at night.), Disp: 90 tablet, Rfl: 1   rivaroxaban (XARELTO) 10 MG TABS tablet, Take 10 mg by mouth daily. Take one tablet (15 mg dose) by mouth daily. Take with Dinner., Disp: , Rfl:    solifenacin (VESICARE) 5 MG tablet, Take 1 tablet (5 mg total) by mouth daily., Disp: 90 tablet, Rfl: 1   vitamin B-12 (CYANOCOBALAMIN) 1000 MCG tablet, Take 1,000 mcg by mouth daily., Disp: , Rfl:    WIXELA INHUB 100-50 MCG/ACT AEPB, Inhale 1 puff into the lungs 2 (two) times daily., Disp: 60 each, Rfl: 1  Current Facility-Administered Medications:    cyanocobalamin ((VITAMIN B-12)) injection 1,000 mcg,  1,000 mcg, Intramuscular, Once, [name], MD   Allergies  Allergen Reactions   No Known Allergies      Review of Systems  Constitutional: Negative.   HENT: Negative.    Gastrointestinal: Negative.   Endocrine: Negative.   Genitourinary: Negative.   Skin: Negative.   Allergic/Immunologic: Negative.   Neurological: Negative.  Psychiatric/Behavioral: Negative.       There were no vitals filed for this visit. There is no height or weight on file to calculate BMI.  Wt Readings from Last 3 Encounters:  11/20/22 187 lb (84.8 kg)  09/27/22 187 lb 9.6 oz (85.1 kg)  08/07/22 191 lb 6.4 oz (86.8 kg)     Objective:  Physical Exam      Assessment And Plan:  There are no diagnoses linked to this encounter.   No follow-ups on file.  Patient was given opportunity to ask questions. Patient verbalized understanding of the plan and was able to repeat key elements of the plan. All questions were answered to their satisfaction.  Gwynneth Aliment, MD  I, Gwynneth Aliment, MD, have reviewed all documentation for this visit. The documentation on 01/13/23 for the exam, diagnosis, procedures, and orders are all accurate and complete.   IF YOU HAVE BEEN REFERRED TO A SPECIALIST, IT MAY TAKE 1-2 WEEKS TO SCHEDULE/PROCESS THE REFERRAL. IF YOU HAVE NOT HEARD FROM US/SPECIALIST IN TWO WEEKS, PLEASE GIVE [place] A CALL AT [phone].   THE PATIENT IS ENCOURAGED TO PRACTICE SOCIAL DISTANCING DUE TO THE COVID-19 PANDEMIC.

## 2023-01-14 ENCOUNTER — Inpatient Hospital Stay: Payer: Medicare Other | Admitting: Internal Medicine

## 2023-01-16 ENCOUNTER — Encounter: Payer: Self-pay | Admitting: Internal Medicine

## 2023-01-16 ENCOUNTER — Telehealth: Payer: Medicare Other | Admitting: Internal Medicine

## 2023-01-16 DIAGNOSIS — M5416 Radiculopathy, lumbar region: Secondary | ICD-10-CM | POA: Diagnosis not present

## 2023-01-16 DIAGNOSIS — I2510 Atherosclerotic heart disease of native coronary artery without angina pectoris: Secondary | ICD-10-CM | POA: Diagnosis not present

## 2023-01-16 DIAGNOSIS — I1310 Hypertensive heart and chronic kidney disease without heart failure, with stage 1 through stage 4 chronic kidney disease, or unspecified chronic kidney disease: Secondary | ICD-10-CM

## 2023-01-16 DIAGNOSIS — N182 Chronic kidney disease, stage 2 (mild): Secondary | ICD-10-CM | POA: Diagnosis not present

## 2023-01-16 DIAGNOSIS — E1122 Type 2 diabetes mellitus with diabetic chronic kidney disease: Secondary | ICD-10-CM

## 2023-01-16 MED ORDER — ROPINIROLE HCL 1 MG PO TABS: ORAL_TABLET | ORAL | Status: DC

## 2023-01-16 NOTE — Progress Notes (Addendum)
I,Victoria T Deloria Lair, CMA,acting as a Neurosurgeon for Gwynneth Aliment, MD.,have documented all relevant documentation on the behalf of Gwynneth Aliment, MD,as directed by  Gwynneth Aliment, MD while in the presence of Gwynneth Aliment, MD.  Subjective:  Patient ID: Franklin Thornton , male    DOB: 19-Nov-1945 , 77 y.o.   MRN: [id number]  Chief Complaint  Patient presents with   Hospitalization Follow-up    HPI  THIS VISIT HAD TO BE CHANGED TO VIRTUAL VISIT BECAUSE HE WAS UNABLE TO TRAVEL TO THE OFFICE. HE WAS AGREEABLE TO THIS TYPE OF VISIT.    PATIENT LOCATION: HOME  PROVIDER LOCATION: OFFICE/CLINIC Patient presents today for hospital follow up. He was admitted to  [facility] on 01/02/2023 for lumbar fusion. He was discharged in stable condition on 01/05/2023.   Hospital course: He had L2-5 POSTERIOR LUMBAR INTERBODY FUSION,PONTE OSTEOTOMY L2-5 performed on 01/02/23. Discharge instructions: Ok to shower immediately. Keep incision clean and dry after your shower. Do not submerge in pool or hot tub. Your pain medication and any other post-operative medication will be ready for you at your pharmacy. Please ambulate as much as possible. Gentle range of motion exercises and light stretching are encouraged.  Today, he admits not feeling too bad. His pain is managed with oxycodone. He has f/u with surgeon on 01/21/23. He states he will be starting physical therapy soon. He prefers to do inpatient PT. He has no new concerns today.     Diabetes He presents for his follow-up diabetic visit. He has type 2 diabetes mellitus. His disease course has been improving. Pertinent negatives for hypoglycemia include no dizziness or headaches. Pertinent negatives for diabetes include no blurred vision, no chest pain, no fatigue, no polydipsia, no polyphagia and no polyuria. There are no hypoglycemic complications. Pertinent negatives for diabetic complications include no retinopathy. Risk factors for coronary artery  disease include diabetes mellitus, dyslipidemia, hypertension, male sex, obesity and sedentary lifestyle. His breakfast blood glucose is taken between 9-10 am. His breakfast blood glucose range is generally 90-110 mg/dl. An ACE inhibitor/angiotensin II receptor blocker is being taken. Eye exam is current.  Hypertension This is a chronic problem. The current episode started more than 1 year ago. Pertinent negatives include no blurred vision, chest pain, headaches, palpitations or shortness of breath. The current treatment provides moderate improvement. There is no history of heart failure or retinopathy.     Past Medical History:  Diagnosis Date   Atrial fibrillation (HCC)    Diabetes mellitus without complication (HCC)    Peripheral vascular disease (HCC)    Prostate cancer (HCC)    Uvular edema 07/17/2015     Family History  Problem Relation Age of Onset   Lung cancer Mother    Diabetes Mother    Esophageal cancer Father    Hypertension Sister    Breast cancer Sister    Healthy Son    Healthy Daughter      Current Outpatient Medications:    albuterol (PROVENTIL HFA;VENTOLIN HFA) 108 (90 Base) MCG/ACT inhaler, Inhale 2 puffs into the lungs every 6 (six) hours as needed for wheezing or shortness of breath., Disp: , Rfl:    albuterol (PROVENTIL) (2.5 MG/3ML) 0.083% nebulizer solution, Take 3 mLs (2.5 mg total) by nebulization every 6 (six) hours as needed for wheezing or shortness of breath., Disp: 75 mL, Rfl: 3   amiodarone (PACERONE) 100 MG tablet, Take 100 mg by mouth daily. , Disp: , Rfl:  amphetamine-dextroamphetamine (ADDERALL) 10 MG tablet, Take 1 tablet by mouth daily. Take one 30mg  tablet by mouth two times daily., Disp: , Rfl:    Armodafinil 250 MG tablet, Take 125-250 mg by mouth every morning., Disp: , Rfl:    ascorbic acid (VITAMIN C) 500 MG tablet, Take 500 mg by mouth daily., Disp: , Rfl:    aspirin 81 MG tablet, Take 81 mg by mouth daily., Disp: , Rfl:    atorvastatin  (LIPITOR) 40 MG tablet, TAKE 1 TABLET DAILY, Disp: 90 tablet, Rfl: 3   azelastine (ASTELIN) 0.1 % nasal spray, Place 1 spray into both nostrils 2 (two) times daily. Use in each nostril as directed, Disp: 30 mL, Rfl: 12   b complex vitamins capsule, Take 1 capsule by mouth daily., Disp: , Rfl:    carvedilol (COREG) 6.25 MG tablet, Take 6.25 mg by mouth 2 (two) times daily with a meal., Disp: , Rfl:    Cholecalciferol (VITAMIN D) 2000 units tablet, Take 2,000 Units by mouth daily., Disp: , Rfl:    Continuous Glucose Sensor (DEXCOM G6 SENSOR) MISC, by Does not apply route., Disp: , Rfl:    diphenhydrAMINE (BENADRYL) 25 MG tablet, Take 25 mg by mouth every 6 (six) hours as needed for itching. , Disp: , Rfl:    DULoxetine (CYMBALTA) 30 MG capsule, TAKE 1 CAPSULE DAILY IN THE EVENING, Disp: 90 capsule, Rfl: 3   empagliflozin (JARDIANCE) 10 MG TABS tablet, Take 1 tablet (10 mg total) by mouth daily. (Patient taking differently: Take 10 mg by mouth daily. Pt states taking 25mg ), Disp: 90 tablet, Rfl: 3   escitalopram (LEXAPRO) 20 MG tablet, Take 20 mg by mouth daily., Disp: , Rfl:    fluorouracil (EFUDEX) 5 % cream, Apply topically 2 (two) times daily., Disp: , Rfl:    furosemide (LASIX) 20 MG tablet, Take 20 mg by mouth daily as needed. , Disp: , Rfl:    Glucagon (GVOKE HYPOPEN 2-PACK) 0.5 MG/0.1ML SOAJ, Inject 0.5 mg into the skin as directed., Disp: 0.1 mL, Rfl: 1   glucose blood (FREESTYLE LITE) test strip, Use as instructed to check blood sugars  3 times per day dx: e11.65, Disp: 300 each, Rfl: 1   glucose monitoring kit (FREESTYLE) monitoring kit, 1 each by Does not apply route as needed for other., Disp: , Rfl:    insulin lispro (HUMALOG KWIKPEN) 100 UNIT/ML KwikPen, 3 Units daily as needed. Inject 3 units for every 50 points over 200 if BG > 200 before meals. Max daily dose: 20 units., Disp: , Rfl:    Insulin Pen Needle (NOVOFINE PEN NEEDLE) 32G X 6 MM MISC, Use with insulin pens, Disp: 300 each,  Rfl: 3   ipratropium (ATROVENT) 0.03 % nasal spray, Place 2 sprays into both nostrils every 12 (twelve) hours., Disp: , Rfl:    LANTUS SOLOSTAR 100 UNIT/ML Solostar Pen, Inject 24 Units into the skin daily. Pt taking 24units-reported on 09/18/22, Disp: , Rfl:    levocetirizine (XYZAL) 5 MG tablet, Take 5 mg by mouth every evening. , Disp: , Rfl:    levothyroxine (SYNTHROID) 50 MCG tablet, Take 1 tablet (50 mcg total) by mouth daily., Disp: 90 tablet, Rfl: 3   lidocaine (LIDODERM) 5 %, Place 3 patches onto the skin daily. May use 1-3 patches at one time, Remove & Discard patch within 12 hours or as directed by MD, Disp: 90 patch, Rfl: 0   losartan (COZAAR) 50 MG tablet, Take 50 mg by mouth 2 (two)  times daily. , Disp: , Rfl:    Magnesium 250 MG TABS, Take 1 tablet by mouth daily., Disp: , Rfl:    methocarbamol (ROBAXIN) 750 MG tablet, Take 750 mg by mouth 3 (three) times daily as needed for muscle spasms., Disp: , Rfl:    mirabegron ER (MYRBETRIQ) 50 MG TB24 tablet, Take 50 mg by mouth daily., Disp: , Rfl:    montelukast (SINGULAIR) 10 MG tablet, Take 10 mg by mouth at bedtime., Disp: , Rfl:    Multiple Vitamin (MULTIVITAMIN) tablet, Take 1 tablet by mouth daily., Disp: , Rfl:    nitroGLYCERIN (NITROSTAT) 0.4 MG SL tablet, Place 1 tablet (0.4 mg total) under the tongue every 5 (five) minutes as needed for chest pain., Disp: 30 tablet, Rfl: 2   Omega-3 Fatty Acids (FISH OIL) 1200 MG CAPS, Take 1,200 mg by mouth. , Disp: , Rfl:    pantoprazole (PROTONIX) 40 MG tablet, TAKE 1 TABLET DAILY, Disp: 90 tablet, Rfl: 3   rivaroxaban (XARELTO) 10 MG TABS tablet, Take 10 mg by mouth daily. Take one tablet (15 mg dose) by mouth daily. Take with Dinner., Disp: , Rfl:    rOPINIRole (REQUIP) 1 MG tablet, One tab po six times daily, Disp: , Rfl:    solifenacin (VESICARE) 5 MG tablet, Take 1 tablet (5 mg total) by mouth daily., Disp: 90 tablet, Rfl: 1   vitamin B-12 (CYANOCOBALAMIN) 1000 MCG tablet, Take 1,000 mcg by  mouth daily., Disp: , Rfl:    WIXELA INHUB 100-50 MCG/ACT AEPB, Inhale 1 puff into the lungs 2 (two) times daily., Disp: 60 each, Rfl: 1   donepezil (ARICEPT) 10 MG tablet, TAKE 1 TABLET AT BEDTIME, Disp: 90 tablet, Rfl: 3  Current Facility-Administered Medications:    cyanocobalamin ((VITAMIN B-12)) injection 1,000 mcg, 1,000 mcg, Intramuscular, Once, [name], MD   Allergies  Allergen Reactions   No Known Allergies      Review of Systems  Constitutional: Negative.  Negative for fatigue.  HENT: Negative.    Eyes:  Negative for blurred vision.  Respiratory: Negative.  Negative for shortness of breath.   Cardiovascular:  Negative for chest pain and palpitations.  Gastrointestinal: Negative.   Endocrine: Negative.  Negative for polydipsia, polyphagia and polyuria.  Genitourinary: Negative.   Skin: Negative.   Allergic/Immunologic: Negative.   Neurological: Negative.  Negative for dizziness and headaches.  Psychiatric/Behavioral: Negative.       There were no vitals filed for this visit. There is no height or weight on file to calculate BMI.  Wt Readings from Last 3 Encounters:  11/20/22 187 lb (84.8 kg)  09/27/22 187 lb 9.6 oz (85.1 kg)  08/07/22 191 lb 6.4 oz (86.8 kg)     Objective:  Physical Exam Vitals and nursing note reviewed.  Constitutional:      Appearance: Normal appearance.  HENT:     Head: Normocephalic and atraumatic.  Eyes:     Extraocular Movements: Extraocular movements intact.  Cardiovascular:     Rate and Rhythm: Normal rate and regular rhythm.     Heart sounds: Normal heart sounds.  Pulmonary:     Effort: Pulmonary effort is normal.     Breath sounds: Normal breath sounds.  Musculoskeletal:     Cervical back: Normal range of motion.  Skin:    General: Skin is warm.  Neurological:     General: No focal deficit present.     Mental Status: He is alert.  Psychiatric:  Mood and Affect: Mood normal.         Assessment And Plan:   Lumbar radiculopathy Assessment & Plan: TCM PERFORMED. A MEMBER OF THE CLINICAL TEAM SPOKE WITH THE PATIENT UPON DISCHARGE. DISCHARGE SUMMARY WAS REVIEWED IN FULL DETAIL (IN CARE EVERYWHERE) DURING THE VISIT. MEDS RECONCILED AND COMPARED TO DISCHARGE MEDS. MEDICATION LIST WAS UPDATED AND REVIEWED WITH THE PATIENT. GREATER THAN 50% FACE TO FACE TIME WAS SPENT IN COUNSELING AND COORDINATION OF CARE. ALL QUESTIONS WERE ANSWERED TO THE SATISFACTION OF THE PATIENT.     Type 2 diabetes mellitus with stage 2 chronic kidney disease, without long-term current use of insulin (HCC) [E11.22, N18.2] -     Microalbumin / creatinine urine ratio; Future  Hypertensive heart and renal disease with renal failure, stage 1 through stage 4 or unspecified chronic kidney disease, without heart failure [I13.10] Assessment & Plan: Chronic, he will continue with losartan 50mg  daily, carvedilol 6.25mg  twice daily and furosemide 20mg  daily. He is encouraged to follow low sodium diet.    Atherosclerosis of native coronary artery of native heart without angina pectoris [I25.10] Assessment & Plan: Chronic, LDL goal is less than 70.  He will continue with ASA 81mg  and atorvastatin 40mg  daily. He is encouraged to follow heart healthy lifestyle.    Other orders -     rOPINIRole HCl; One tab po six times daily    Return if symptoms worsen or fail to improve.  Patient was given opportunity to ask questions. Patient verbalized understanding of the plan and was able to repeat key elements of the plan. All questions were answered to their satisfaction.   TIME SPENT: 20 MINUTES I, Gwynneth Aliment, MD, have reviewed all documentation for this visit. The documentation on 01/16/23 for the exam, diagnosis, procedures, and orders are all accurate and complete.   IF YOU HAVE BEEN REFERRED TO A SPECIALIST, IT MAY TAKE 1-2 WEEKS TO SCHEDULE/PROCESS THE REFERRAL. IF YOU HAVE NOT HEARD FROM US/SPECIALIST IN TWO WEEKS, PLEASE GIVE [place] A  CALL AT [phone] X [phone].   THE PATIENT IS ENCOURAGED TO PRACTICE SOCIAL DISTANCING DUE TO THE COVID-19 PANDEMIC.

## 2023-01-23 ENCOUNTER — Other Ambulatory Visit: Payer: Self-pay | Admitting: Internal Medicine

## 2023-01-26 NOTE — Assessment & Plan Note (Signed)
Chronic, he will continue with losartan 50mg  daily, carvedilol 6.25mg  twice daily and furosemide 20mg  daily. He is encouraged to follow low sodium diet.

## 2023-01-26 NOTE — Assessment & Plan Note (Signed)
Chronic, LDL goal is less than 70.  He will continue with ASA 81mg  and atorvastatin 40mg  daily. He is encouraged to follow heart healthy lifestyle.

## 2023-01-26 NOTE — Patient Instructions (Signed)
Radicular Pain Radicular pain is a type of pain that spreads from your back or neck along a spinal nerve. Spinal nerves are nerves that leave the spinal cord and go to the muscles. Radicular pain is sometimes called radiculopathy, radiculitis, or a pinched nerve. When you have this type of pain, you may also have weakness, numbness, or tingling in the area of your body that is supplied by the nerve. The pain may feel sharp and burning. Depending on which spinal nerve is affected, the pain may occur in the: Neck area (cervical radicular pain). You may also feel pain, numbness, weakness, or tingling in the arms. Mid-spine area (thoracic radicular pain). You would feel this pain in the back and chest. This type is rare. Lower back area (lumbar radicular pain). You would feel this pain as low back pain. You may feel pain, numbness, weakness, or tingling in the buttocks or legs. Sciatica is a type of lumbar radicular pain that shoots down the back of the leg. Radicular pain occurs when one of the spinal nerves becomes irritated or squeezed (compressed). It is often caused by something pushing on a spinal nerve, such as one of the bones of the spine (vertebrae) or one of the round cushions between vertebrae (intervertebral disks). This can result from: An injury. Wear and tear or aging of a disk. The growth of a bone spur that pushes on the nerve. Radicular pain often goes away when you follow instructions from your health care provider for relieving pain at home. How is this treated? Treatment may depend on the cause of the condition and may include: Working with a physical therapist. Taking pain medicine. Applying heat or ice or both to the affected areas. Doing stretches to improve flexibility. Having surgery. This may be needed if other treatments do not help. Different types of surgery may be done depending on the cause of this condition. Follow these instructions at home: Managing pain     If  directed, put ice on the affected area. To do this: Put ice in a plastic bag. Place a towel between your skin and the bag. Leave the ice on for 20 minutes, 2-3 times a day. Remove the ice if your skin turns bright red. This is very important. If you cannot feel pain, heat, or cold, you have a greater risk of damage to the area. If directed, apply heat to the affected area as often as told by your health care provider. Use the heat source that your health care provider recommends, such as a moist heat pack or a heating pad. Place a towel between your skin and the heat source. Leave the heat on for 20-30 minutes. Remove the heat if your skin turns bright red. This is especially important if you are unable to feel pain, heat, or cold. You have a greater risk of getting burned. Activity Do not sit or rest in bed for long periods of time. Try to stay as active as possible. Ask your health care provider what type of exercise or activity is best for you. Avoid activities that make your pain worse, such as bending and lifting. You may have to avoid lifting. Ask your health care provider how much you can safely lift. Practice using proper technique when lifting items. Proper lifting technique involves bending your knees and rising up. Do strength and range-of-motion exercises only as told by your health care provider or physical therapist. General instructions Take over-the-counter and prescription medicines only as told by your  health care provider. Pay attention to any changes in your symptoms. Keep all follow-up visits. This is important. Contact a health care provider if: Your pain and other symptoms get worse. Your pain medicine is not helping. Your pain has not improved after a few weeks of home care. You have a fever. Get help right away if: You have severe pain, weakness, or numbness. You have difficulty with bladder or bowel control. Summary Radicular pain is a type of pain that spreads  from your back or neck along a spinal nerve. When you have radicular pain, you may also have weakness, numbness, or tingling in the area of your body that is supplied by the nerve. The pain may feel sharp or burning. Radicular pain may be treated with ice, heat, medicines, or physical therapy. This information is not intended to replace advice given to you by your health care provider. Make sure you discuss any questions you have with your health care provider. Document Revised: 10/12/2020 Document Reviewed: 10/12/2020 Elsevier Patient Education  2024 [employer].

## 2023-01-26 NOTE — Assessment & Plan Note (Signed)
TCM PERFORMED. A MEMBER OF THE CLINICAL TEAM SPOKE WITH THE PATIENT UPON DISCHARGE. DISCHARGE SUMMARY WAS REVIEWED IN FULL DETAIL (IN CARE EVERYWHERE) DURING THE VISIT. MEDS RECONCILED AND COMPARED TO DISCHARGE MEDS. MEDICATION LIST WAS UPDATED AND REVIEWED WITH THE PATIENT. GREATER THAN 50% FACE TO FACE TIME WAS SPENT IN COUNSELING AND COORDINATION OF CARE. ALL QUESTIONS WERE ANSWERED TO THE SATISFACTION OF THE PATIENT.

## 2023-01-30 ENCOUNTER — Ambulatory Visit: Payer: Self-pay | Admitting: Internal Medicine

## 2023-01-30 ENCOUNTER — Ambulatory Visit: Payer: Medicare Other

## 2023-01-30 DIAGNOSIS — Z0000 Encounter for general adult medical examination without abnormal findings: Secondary | ICD-10-CM | POA: Diagnosis not present

## 2023-01-30 NOTE — Patient Instructions (Signed)
Mr. Schumpert , Thank you for taking time to come for your Medicare Wellness Visit. I appreciate your ongoing commitment to your health goals. Please review the following plan we discussed and let me know if I can assist you in the future.   Referrals/Orders/Follow-Ups/Clinician Recommendations: none  This is a list of the screening recommended for you and due dates:  Health Maintenance  Topic Date Due   Yearly kidney health urinalysis for diabetes  11/16/2022   Flu Shot  11/21/2022   COVID-19 Vaccine (7 - 2023-24 season) 12/22/2022   Complete foot exam   02/13/2023   Hemoglobin A1C  06/18/2023   Eye exam for diabetics  08/28/2023   Yearly kidney function blood test for diabetes  09/27/2023   Medicare Annual Wellness Visit  01/30/2024   DTaP/Tdap/Td vaccine (3 - Td or Tdap) 11/02/2025   Pneumonia Vaccine  Completed   Hepatitis C Screening  Completed   Zoster (Shingles) Vaccine  Completed   HPV Vaccine  Aged Out   Colon Cancer Screening  Discontinued    Advanced directives: (Copy Requested) Please bring a copy of your health care power of attorney and living will to the office to be added to your chart at your convenience.  Next Medicare Annual Wellness Visit scheduled for next year: No, office will schedule appointment   insert Preventive Care attachment Insert FALL PREVENTION attachment if needed

## 2023-01-30 NOTE — Progress Notes (Signed)
Subjective:   Franklin Thornton is a 77 y.o. male who presents for Medicare Annual/Subsequent preventive examination.  Visit Complete: Virtual I connected with  Franklin Thornton on 01/30/23 by a audio enabled telemedicine application and verified that I am speaking with the correct person using two identifiers.  Patient Location: Home  Provider Location: Office/Clinic  I discussed the limitations of evaluation and management by telemedicine. The patient expressed understanding and agreed to proceed.  Vital Signs: Because this visit was a virtual/telehealth visit, some criteria may be missing or patient reported. Any vitals not documented were not able to be obtained and vitals that have been documented are patient reported.    Cardiac Risk Factors include: advanced age (>49men, >74 women);diabetes mellitus;dyslipidemia;hypertension;male gender     Objective:    Today's Vitals   01/30/23 1356  PainSc: 6    There is no height or weight on file to calculate BMI.     01/30/2023    2:06 PM 01/10/2022    2:12 PM 12/07/2020    9:20 AM 11/24/2019    9:04 AM 11/18/2018    9:21 AM 08/28/2017    9:26 AM 07/28/2017    2:11 PM  Advanced Directives  Does Patient Have a Medical Advance Directive? Yes Yes No Yes Yes No No  Type of Estate agent of [place];Living will Healthcare Power of [place];Living will  Healthcare Power of [place];Living will Healthcare Power of Attorney    Copy of Healthcare Power of Attorney in Chart? No - copy requested No - copy requested  No - copy requested No - copy requested    Would patient like information on creating a medical advance directive?       Yes (MAU/Ambulatory/Procedural Areas - Information given)    Current Medications (verified) Outpatient Encounter Medications as of 01/30/2023  Medication Sig   albuterol (PROVENTIL HFA;VENTOLIN HFA) 108 (90 Base) MCG/ACT inhaler Inhale 2 puffs into the lungs every 6 (six) hours as needed for  wheezing or shortness of breath.   albuterol (PROVENTIL) (2.5 MG/3ML) 0.083% nebulizer solution Take 3 mLs (2.5 mg total) by nebulization every 6 (six) hours as needed for wheezing or shortness of breath.   amiodarone (PACERONE) 100 MG tablet Take 100 mg by mouth daily.    amphetamine-dextroamphetamine (ADDERALL) 10 MG tablet Take 1 tablet by mouth daily. Take one 30mg  tablet by mouth two times daily.   Armodafinil 250 MG tablet Take 125-250 mg by mouth every morning.   ascorbic acid (VITAMIN C) 500 MG tablet Take 500 mg by mouth daily.   aspirin 81 MG tablet Take 81 mg by mouth daily.   atorvastatin (LIPITOR) 40 MG tablet TAKE 1 TABLET DAILY   azelastine (ASTELIN) 0.1 % nasal spray Place 1 spray into both nostrils 2 (two) times daily. Use in each nostril as directed   b complex vitamins capsule Take 1 capsule by mouth daily.   carvedilol (COREG) 6.25 MG tablet Take 6.25 mg by mouth 2 (two) times daily with a meal.   Cholecalciferol (VITAMIN D) 2000 units tablet Take 2,000 Units by mouth daily.   Continuous Glucose Sensor (DEXCOM G6 SENSOR) MISC by Does not apply route.   diphenhydrAMINE (BENADRYL) 25 MG tablet Take 25 mg by mouth every 6 (six) hours as needed for itching.    donepezil (ARICEPT) 10 MG tablet TAKE 1 TABLET AT BEDTIME   DULoxetine (CYMBALTA) 30 MG capsule TAKE 1 CAPSULE DAILY IN THE EVENING   empagliflozin (JARDIANCE) 10 MG TABS  tablet Take 1 tablet (10 mg total) by mouth daily. (Patient taking differently: Take 10 mg by mouth daily. Pt states taking 25mg )   escitalopram (LEXAPRO) 20 MG tablet Take 20 mg by mouth daily.   fluorouracil (EFUDEX) 5 % cream Apply topically 2 (two) times daily.   furosemide (LASIX) 20 MG tablet Take 20 mg by mouth daily as needed.    Glucagon (GVOKE HYPOPEN 2-PACK) 0.5 MG/0.1ML SOAJ Inject 0.5 mg into the skin as directed.   glucose blood (FREESTYLE LITE) test strip Use as instructed to check blood sugars  3 times per day dx: e11.65   glucose  monitoring kit (FREESTYLE) monitoring kit 1 each by Does not apply route as needed for other.   insulin lispro (HUMALOG KWIKPEN) 100 UNIT/ML KwikPen 3 Units daily as needed. Inject 3 units for every 50 points over 200 if BG > 200 before meals. Max daily dose: 20 units.   Insulin Pen Needle (NOVOFINE PEN NEEDLE) 32G X 6 MM MISC Use with insulin pens   ipratropium (ATROVENT) 0.03 % nasal spray Place 2 sprays into both nostrils every 12 (twelve) hours.   LANTUS SOLOSTAR 100 UNIT/ML Solostar Pen Inject 24 Units into the skin daily. Pt taking 24units-reported on 09/18/22   levocetirizine (XYZAL) 5 MG tablet Take 5 mg by mouth every evening.    levothyroxine (SYNTHROID) 50 MCG tablet Take 1 tablet (50 mcg total) by mouth daily.   lidocaine (LIDODERM) 5 % Place 3 patches onto the skin daily. May use 1-3 patches at one time, Remove & Discard patch within 12 hours or as directed by MD   losartan (COZAAR) 50 MG tablet Take 50 mg by mouth 2 (two) times daily.    Magnesium 250 MG TABS Take 1 tablet by mouth daily.   methocarbamol (ROBAXIN) 750 MG tablet Take 750 mg by mouth 3 (three) times daily as needed for muscle spasms.   mirabegron ER (MYRBETRIQ) 50 MG TB24 tablet Take 50 mg by mouth daily.   montelukast (SINGULAIR) 10 MG tablet Take 10 mg by mouth at bedtime.   Multiple Vitamin (MULTIVITAMIN) tablet Take 1 tablet by mouth daily.   nitroGLYCERIN (NITROSTAT) 0.4 MG SL tablet Place 1 tablet (0.4 mg total) under the tongue every 5 (five) minutes as needed for chest pain.   Omega-3 Fatty Acids (FISH OIL) 1200 MG CAPS Take 1,200 mg by mouth.    pantoprazole (PROTONIX) 40 MG tablet TAKE 1 TABLET DAILY   rivaroxaban (XARELTO) 10 MG TABS tablet Take 10 mg by mouth daily. Take one tablet (15 mg dose) by mouth daily. Take with Dinner.   rOPINIRole (REQUIP) 1 MG tablet One tab po six times daily   solifenacin (VESICARE) 5 MG tablet Take 1 tablet (5 mg total) by mouth daily.   vitamin B-12 (CYANOCOBALAMIN) 1000 MCG  tablet Take 1,000 mcg by mouth daily.   WIXELA INHUB 100-50 MCG/ACT AEPB Inhale 1 puff into the lungs 2 (two) times daily.   Facility-Administered Encounter Medications as of 01/30/2023  Medication   cyanocobalamin ((VITAMIN B-12)) injection 1,000 mcg    Allergies (verified) No known allergies   History: Past Medical History:  Diagnosis Date   Atrial fibrillation (HCC)    Diabetes mellitus without complication (HCC)    Peripheral vascular disease (HCC)    Prostate cancer (HCC)    Uvular edema 07/17/2015   Past Surgical History:  Procedure Laterality Date   CORONARY ANGIOPLASTY WITH STENT PLACEMENT     x2   ENDOVENOUS ABLATION SAPHENOUS  VEIN W/ LASER Left 08/19/2017   endovenous laser ablation left greater saphenous vein by Josephina Gip MD    EXTERNAL EAR SURGERY  2020   right ear    lower back surgery  01/02/2023   UVULECTOMY  07/31/2015   Family History  Problem Relation Age of Onset   Lung cancer Mother    Diabetes Mother    Esophageal cancer Father    Hypertension Sister    Breast cancer Sister    Healthy Son    Healthy Daughter    Social History   Socioeconomic History   Marital status: Married    Spouse name: Not on file   Number of children: Not on file   Years of education: Not on file   Highest education level: Not on file  Occupational History   Occupation: retired  Tobacco Use   Smoking status: Former    Current packs/day: 0.00    Average packs/day: 2.5 packs/day for 12.3 years (30.8 ttl pk-yrs)    Types: Cigarettes    Start date: 04/23/1959    Quit date: 08/14/1971    Years since quitting: 51.4   Smokeless tobacco: Never   Tobacco comments:    he has quit  Vaping Use   Vaping status: Never Used  Substance and Sexual Activity   Alcohol use: Not Currently    Alcohol/week: 1.0 standard drink of alcohol    Types: 1 Standard drinks or equivalent per week    Comment: once a month   Drug use: Not Currently    Types: Hydrocodone   Sexual activity:  Not Currently  Other Topics Concern   Not on file  Social History Narrative   Not on file   Social Determinants of Health   Financial Resource Strain: Low Risk  (01/30/2023)   Overall Financial Resource Strain (CARDIA)    Difficulty of Paying Living Expenses: Not hard at all  Food Insecurity: No Food Insecurity (01/30/2023)   Hunger Vital Sign    Worried About Running Out of Food in the Last Year: Never true    Ran Out of Food in the Last Year: Never true  Transportation Needs: No Transportation Needs (01/30/2023)   PRAPARE - Administrator, Civil Service (Medical): No    Lack of Transportation (Non-Medical): No  Physical Activity: Inactive (01/30/2023)   Exercise Vital Sign    Days of Exercise per Week: 0 days    Minutes of Exercise per Session: 0 min  Stress: No Stress Concern Present (01/30/2023)   [employer] of Occupational Health - Occupational Stress Questionnaire    Feeling of Stress : Not at all  Recent Concern: Stress - Stress Concern Present (01/02/2023)   Received from [facility] of Occupational Health - Occupational Stress Questionnaire    Feeling of Stress : To some extent  Social Connections: Moderately Integrated (01/30/2023)   Social Connection and Isolation Panel [NHANES]    Frequency of Communication with Friends and Family: More than three times a week    Frequency of Social Gatherings with Friends and Family: Once a week    Attends Religious Services: Never    Database administrator or Organizations: Yes    Attends Engineer, structural: More than 4 times per year    Marital Status: Married    Tobacco Counseling Counseling given: Not Answered Tobacco comments: he has quit   Clinical Intake:  Pre-visit preparation completed: Yes  Pain : 0-10 Pain Score: 6  Pain Type: Chronic pain Pain Location: Back Pain Orientation: Lower, Right Pain Onset: More than a month ago Pain Frequency: Constant      Nutritional Risks: None Diabetes: Yes CBG done?: No Did pt. bring in CBG monitor from home?: No  How often do you need to have someone help you when you read instructions, pamphlets, or other written materials from your doctor or pharmacy?: 1 - Never  Interpreter Needed?: No  Information entered by :: NAllen LPN   Activities of Daily Living    01/30/2023    1:57 PM  In your present state of health, do you have any difficulty performing the following activities:  Hearing? 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 0  Difficulty concentrating or making decisions? 1  Comment occasionally  Walking or climbing stairs? 0  Dressing or bathing? 0  Doing errands, shopping? 0  Preparing Food and eating ? N  Using the Toilet? N  In the past six months, have you accidently leaked urine? Y  Comment takes medication  Do you have problems with loss of bowel control? N  Managing your Medications? N  Managing your Finances? N  Housekeeping or managing your Housekeeping? N    Patient Care Team: [name], MD as PCP - General (Internal Medicine) Marcellus Scott, MD as Referring Physician (Specialist) Thomes Cake, MD as Referring Physician (Internal Medicine) Sallye Lat, MD as Consulting Physician (Ophthalmology) Yates Decamp, MD as Consulting Physician (Cardiology) Caudill, Maryjane Hurter, [facility] (Inactive) (Pharmacist)  Indicate any recent Medical Services you may have received from other than [facility] providers in the past year (date may be approximate).     Assessment:   This is a routine wellness examination for [place].  Hearing/Vision screen Hearing Screening - Comments:: Lost a hearing aid Vision Screening - Comments:: Regular eye exams, [facility]   Goals Addressed             This Visit's Progress    Patient Stated       01/30/2023, recover from surgery       Depression Screen    01/30/2023    2:09 PM 01/16/2023   11:29 AM 09/27/2022    1:32  PM 08/07/2022    4:35 PM 06/17/2022   11:36 AM 05/21/2022    9:11 AM 02/12/2022   11:16 AM  PHQ 2/9 Scores  PHQ - 2 Score 0 3 3 0 0 4 1  PHQ- 9 Score  8 15 0  17 8    Fall Risk    01/30/2023    2:07 PM 01/16/2023   11:29 AM 08/07/2022    4:35 PM 06/17/2022   11:36 AM 05/24/2022    1:11 PM  Fall Risk   Falls in the past year? 1 1 0 1 0  Comment loses balance      Number falls in past yr: 1 0 0 0 0  Injury with Fall? 0 1 0 0 0  Risk for fall due to : Impaired balance/gait;Impaired mobility;Medication side effect;History of fall(s) History of fall(s) No Fall Risks History of fall(s) No Fall Risks  Follow up Falls prevention discussed;Falls evaluation completed Falls evaluation completed Falls evaluation completed Falls evaluation completed Falls evaluation completed    MEDICARE RISK AT HOME: Medicare Risk at Home Any stairs in or around the home?: No If so, are there any without handrails?: No Home free of loose throw rugs in walkways, pet beds, electrical cords, etc?: Yes Adequate lighting in  your home to reduce risk of falls?: Yes Life alert?: No Use of a cane, walker or w/c?: Yes Grab bars in the bathroom?: Yes Shower chair or bench in shower?: Yes Elevated toilet seat or a handicapped toilet?: No  TIMED UP AND GO:  Was the test performed?  No    Cognitive Function:        01/30/2023    2:09 PM 01/10/2022    2:19 PM 12/07/2020    9:23 AM 11/24/2019    9:09 AM 11/18/2018    9:27 AM  6CIT Screen  What Year? 0 points 0 points 0 points 0 points 0 points  What month? 0 points 0 points 0 points 0 points 0 points  What time? 0 points 0 points 0 points 0 points 0 points  Count back from 20 0 points 0 points 0 points 0 points 0 points  Months in reverse 0 points 0 points 0 points 0 points 0 points  Repeat phrase 2 points 2 points 2 points 0 points 0 points  Total Score 2 points 2 points 2 points 0 points 0 points    Immunizations Immunization History  Administered Date(s)  Administered   Fluad Quad(high Dose 65+) 03/14/2020, 02/08/2021, 01/10/2022   H1N1 07/04/2008   Influenza Split 02/20/2014   Influenza, High Dose Seasonal PF 12/30/2018   Influenza, Seasonal, Injecte, Preservative Fre 04/28/2015   Influenza,inj,Quad PF,6+ Mos 05/04/2018   Moderna SARS-COV2 Booster Vaccination 02/18/2020   Moderna Sars-Covid-2 Vaccination 06/30/2019, 07/21/2019, 07/28/2019, 03/20/2020, 03/27/2021   Pneumococcal Conjugate-13 03/10/2014   Pneumococcal Polysaccharide-23 08/31/2014   Pneumococcal-Unspecified 04/10/2010, 08/31/2014   Tdap 03/10/2014, 11/03/2015   Zoster Recombinant(Shingrix) 05/16/2020, 07/17/2020    TDAP status: Up to date  Flu Vaccine status: Due, Education has been provided regarding the importance of this vaccine. Advised may receive this vaccine at local pharmacy or Health Dept. Aware to provide a copy of the vaccination record if obtained from local pharmacy or Health Dept. Verbalized acceptance and understanding.  Pneumococcal vaccine status: Up to date  Covid-19 vaccine status: Information provided on how to obtain vaccines.   Qualifies for Shingles Vaccine? Yes   Zostavax completed Yes   Shingrix Completed?: Yes  Screening Tests Health Maintenance  Topic Date Due   Diabetic kidney evaluation - Urine ACR  11/16/2022   INFLUENZA VACCINE  11/21/2022   COVID-19 Vaccine (7 - 2023-24 season) 12/22/2022   FOOT EXAM  02/13/2023   HEMOGLOBIN A1C  06/18/2023   OPHTHALMOLOGY EXAM  08/28/2023   Diabetic kidney evaluation - eGFR measurement  09/27/2023   Medicare Annual Wellness (AWV)  01/30/2024   DTaP/Tdap/Td (3 - Td or Tdap) 11/02/2025   Pneumonia Vaccine 46+ Years old  Completed   Hepatitis C Screening  Completed   Zoster Vaccines- Shingrix  Completed   HPV VACCINES  Aged Out   Colonoscopy  Discontinued    Health Maintenance  Health Maintenance Due  Topic Date Due   Diabetic kidney evaluation - Urine ACR  11/16/2022   INFLUENZA VACCINE   11/21/2022   COVID-19 Vaccine (7 - 2023-24 season) 12/22/2022    Colorectal cancer screening: No longer required.   Lung Cancer Screening: (Low Dose CT Chest recommended if Age 82-80 years, 20 pack-year currently smoking OR have quit w/in 15years.) does not qualify.   Lung Cancer Screening Referral: no  Additional Screening:  Hepatitis C Screening: does qualify; Completed 07/22/2012  Vision Screening: Recommended annual ophthalmology exams for early detection of glaucoma and other disorders of the eye. Is  the patient up to date with their annual eye exam?  Yes  Who is the provider or what is the name of the office in which the patient attends annual eye exams? [facility] If pt is not established with a provider, would they like to be referred to a provider to establish care? No .   Dental Screening: Recommended annual dental exams for proper oral hygiene  Diabetic Foot Exam: Diabetic Foot Exam: Completed 02/12/2022  Community Resource Referral / Chronic Care Management: CRR required this visit?  No   CCM required this visit?  No     Plan:     I have personally reviewed and noted the following in the patient's chart:   Medical and social history Use of alcohol, tobacco or illicit drugs  Current medications and supplements including opioid prescriptions. Patient is not currently taking opioid prescriptions. Functional ability and status Nutritional status Physical activity Advanced directives List of other physicians Hospitalizations, surgeries, and ER visits in previous 12 months Vitals Screenings to include cognitive, depression, and falls Referrals and appointments  In addition, I have reviewed and discussed with patient certain preventive protocols, quality metrics, and best practice recommendations. A written personalized care plan for preventive services as well as general preventive health recommendations were provided to patient.     Barb Merino,  LPN   [id number]   After Visit Summary: (Pick Up) Due to this being a telephonic visit, with patients personalized plan was offered to patient and patient has requested to Pick up at office.  Nurse Notes: none

## 2023-02-06 ENCOUNTER — Other Ambulatory Visit: Payer: Self-pay | Admitting: Internal Medicine

## 2023-03-17 DIAGNOSIS — B351 Tinea unguium: Secondary | ICD-10-CM | POA: Diagnosis not present

## 2023-03-17 DIAGNOSIS — E1169 Type 2 diabetes mellitus with other specified complication: Secondary | ICD-10-CM | POA: Diagnosis not present

## 2023-03-17 DIAGNOSIS — E1142 Type 2 diabetes mellitus with diabetic polyneuropathy: Secondary | ICD-10-CM | POA: Diagnosis not present

## 2023-03-17 DIAGNOSIS — R200 Anesthesia of skin: Secondary | ICD-10-CM | POA: Diagnosis not present

## 2023-03-17 DIAGNOSIS — E663 Overweight: Secondary | ICD-10-CM | POA: Diagnosis not present

## 2023-03-17 DIAGNOSIS — E785 Hyperlipidemia, unspecified: Secondary | ICD-10-CM | POA: Diagnosis not present

## 2023-03-17 DIAGNOSIS — I152 Hypertension secondary to endocrine disorders: Secondary | ICD-10-CM | POA: Diagnosis not present

## 2023-03-17 DIAGNOSIS — E1159 Type 2 diabetes mellitus with other circulatory complications: Secondary | ICD-10-CM | POA: Diagnosis not present

## 2023-04-08 ENCOUNTER — Other Ambulatory Visit: Payer: Self-pay

## 2023-04-08 DIAGNOSIS — E1122 Type 2 diabetes mellitus with diabetic chronic kidney disease: Secondary | ICD-10-CM

## 2023-04-14 ENCOUNTER — Other Ambulatory Visit: Payer: Self-pay | Admitting: Internal Medicine

## 2023-04-14 DIAGNOSIS — N182 Chronic kidney disease, stage 2 (mild): Secondary | ICD-10-CM | POA: Diagnosis not present

## 2023-04-14 DIAGNOSIS — E1122 Type 2 diabetes mellitus with diabetic chronic kidney disease: Secondary | ICD-10-CM | POA: Diagnosis not present

## 2023-04-15 LAB — MICROALBUMIN / CREATININE URINE RATIO
Creatinine, Urine: 25.6 mg/dL
Microalb/Creat Ratio: <12 mg/g{creat} (ref 0–29)
Microalbumin, Urine: <3 ug/mL

## 2023-04-21 DIAGNOSIS — M5416 Radiculopathy, lumbar region: Secondary | ICD-10-CM | POA: Diagnosis not present

## 2023-04-21 DIAGNOSIS — E1142 Type 2 diabetes mellitus with diabetic polyneuropathy: Secondary | ICD-10-CM | POA: Diagnosis not present

## 2023-04-21 DIAGNOSIS — M545 Low back pain, unspecified: Secondary | ICD-10-CM | POA: Diagnosis not present

## 2023-04-21 DIAGNOSIS — G8929 Other chronic pain: Secondary | ICD-10-CM | POA: Diagnosis not present

## 2023-04-21 DIAGNOSIS — G894 Chronic pain syndrome: Secondary | ICD-10-CM | POA: Diagnosis not present

## 2023-04-29 DIAGNOSIS — M5116 Intervertebral disc disorders with radiculopathy, lumbar region: Secondary | ICD-10-CM | POA: Diagnosis not present

## 2023-04-29 DIAGNOSIS — G609 Hereditary and idiopathic neuropathy, unspecified: Secondary | ICD-10-CM | POA: Diagnosis not present

## 2023-05-15 DIAGNOSIS — M5417 Radiculopathy, lumbosacral region: Secondary | ICD-10-CM | POA: Diagnosis not present

## 2023-05-15 DIAGNOSIS — M5412 Radiculopathy, cervical region: Secondary | ICD-10-CM | POA: Diagnosis not present

## 2023-05-15 DIAGNOSIS — G603 Idiopathic progressive neuropathy: Secondary | ICD-10-CM | POA: Diagnosis not present

## 2023-05-22 DIAGNOSIS — I83029 Varicose veins of left lower extremity with ulcer of unspecified site: Secondary | ICD-10-CM | POA: Diagnosis not present

## 2023-05-22 DIAGNOSIS — L97919 Non-pressure chronic ulcer of unspecified part of right lower leg with unspecified severity: Secondary | ICD-10-CM | POA: Diagnosis not present

## 2023-05-22 DIAGNOSIS — T8133XA Disruption of traumatic injury wound repair, initial encounter: Secondary | ICD-10-CM | POA: Diagnosis not present

## 2023-05-22 DIAGNOSIS — I872 Venous insufficiency (chronic) (peripheral): Secondary | ICD-10-CM | POA: Diagnosis not present

## 2023-05-22 DIAGNOSIS — T8131XA Disruption of external operation (surgical) wound, not elsewhere classified, initial encounter: Secondary | ICD-10-CM | POA: Diagnosis not present

## 2023-05-22 DIAGNOSIS — L97929 Non-pressure chronic ulcer of unspecified part of left lower leg with unspecified severity: Secondary | ICD-10-CM | POA: Diagnosis not present

## 2023-05-22 DIAGNOSIS — I83019 Varicose veins of right lower extremity with ulcer of unspecified site: Secondary | ICD-10-CM | POA: Diagnosis not present

## 2023-05-26 DIAGNOSIS — M2042 Other hammer toe(s) (acquired), left foot: Secondary | ICD-10-CM | POA: Diagnosis not present

## 2023-05-26 DIAGNOSIS — M2041 Other hammer toe(s) (acquired), right foot: Secondary | ICD-10-CM | POA: Diagnosis not present

## 2023-05-26 DIAGNOSIS — B351 Tinea unguium: Secondary | ICD-10-CM | POA: Diagnosis not present

## 2023-05-26 DIAGNOSIS — I739 Peripheral vascular disease, unspecified: Secondary | ICD-10-CM | POA: Diagnosis not present

## 2023-05-26 DIAGNOSIS — E1142 Type 2 diabetes mellitus with diabetic polyneuropathy: Secondary | ICD-10-CM | POA: Diagnosis not present

## 2023-05-28 DIAGNOSIS — Z981 Arthrodesis status: Secondary | ICD-10-CM | POA: Diagnosis not present

## 2023-05-28 DIAGNOSIS — M5412 Radiculopathy, cervical region: Secondary | ICD-10-CM | POA: Diagnosis not present

## 2023-05-30 IMAGING — MR MR HEAD W/O CM
8 series · 48 of 48 positions shown · non-contrast
Comparison: None.

CLINICAL DATA: Memory loss and gait abnormality.

EXAM:
MRI HEAD WITHOUT CONTRAST
TECHNIQUE: Multiplanar, multiecho pulse sequences of the brain and surrounding
structures were obtained without intravenous contrast.

[Series 3: T1 · sagittal · 5.0mm · 0.90mm/px · 3 of 27 slices shown]
[im 1/27]
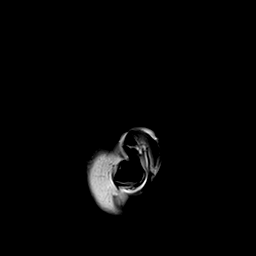
[im 14/27]
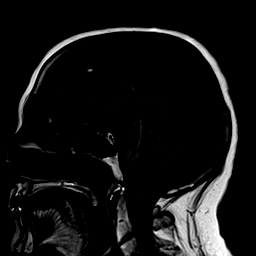
[im 27/27]
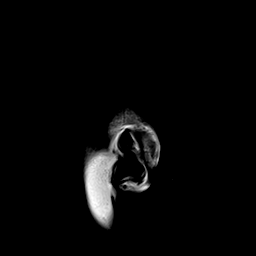

[Series 4: DWI · axial · 3.0mm · 1.88mm/px · z∈[-36,+116]mm · 12 of 95 slices shown (1 of 2)]
[im 1/95]
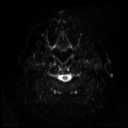
[im 9/95]
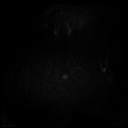
[im 18/95]
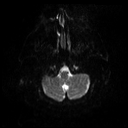
[im 26/95]
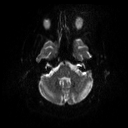
[im 35/95]
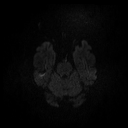
[im 43/95]
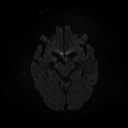
[im 52/95]
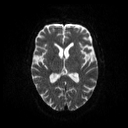
[im 60/95]
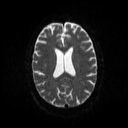
[im 69/95]
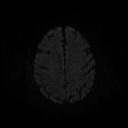
[im 77/95]
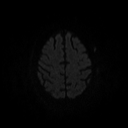
[im 86/95]
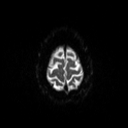
[im 95/95]
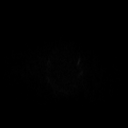

[Series 5: DWI · axial · 3.0mm · 1.88mm/px · z∈[-36,+116]mm · 6 of 47 slices shown (2 of 2)]
[im 1/47]
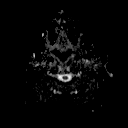
[im 10/47]
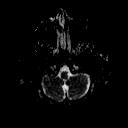
[im 19/47]
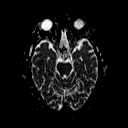
[im 28/47]
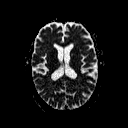
[im 37/47]
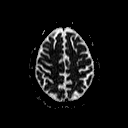
[im 47/47]
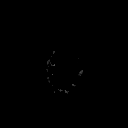

[Series 6: T2 · axial · 5.0mm · 0.69mm/px · z∈[-41,+118]mm · 3 of 28 slices shown (1 of 3)]
[im 1/28]
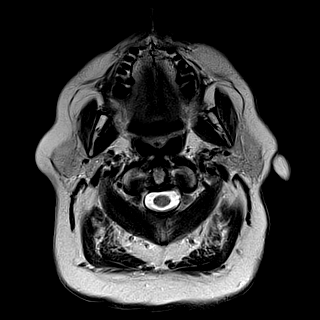
[im 14/28]
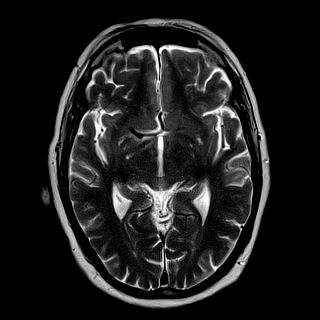
[im 28/28]
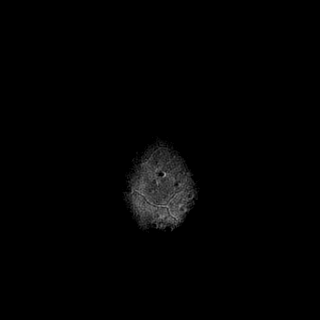

[Series 7: T2 · axial · 5.0mm · 0.43mm/px · z∈[-41,+118]mm · 3 of 28 slices shown (2 of 3)]
[im 1/28]
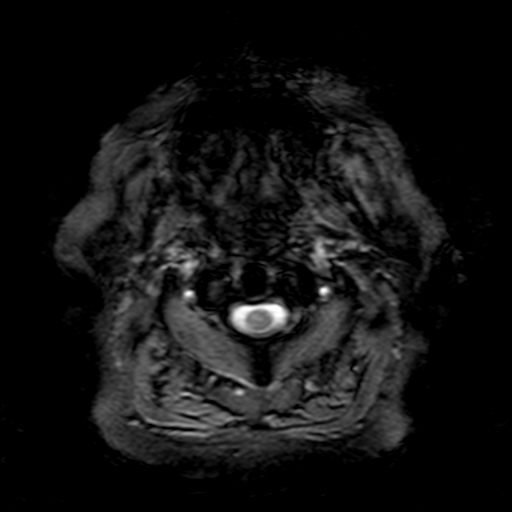
[im 14/28]
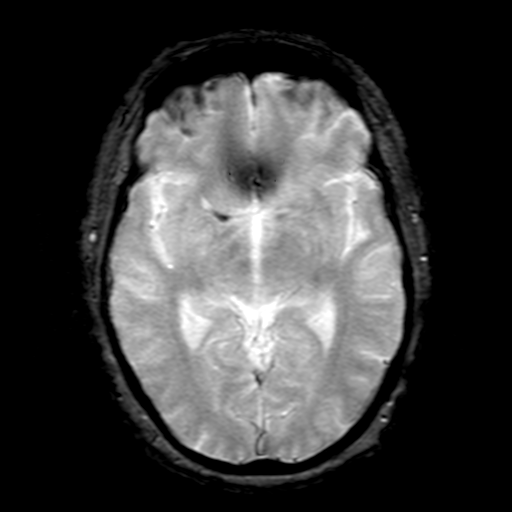
[im 28/28]
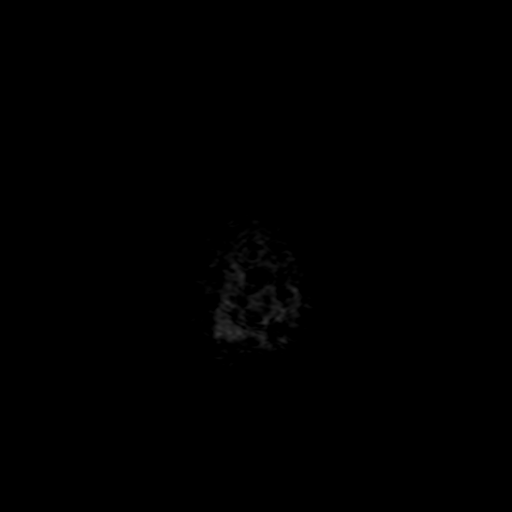

[Series 8: FLAIR · axial · 3.0mm · 0.43mm/px · z∈[-42,+119]mm · 5 of 42 slices shown]
[im 1/42]
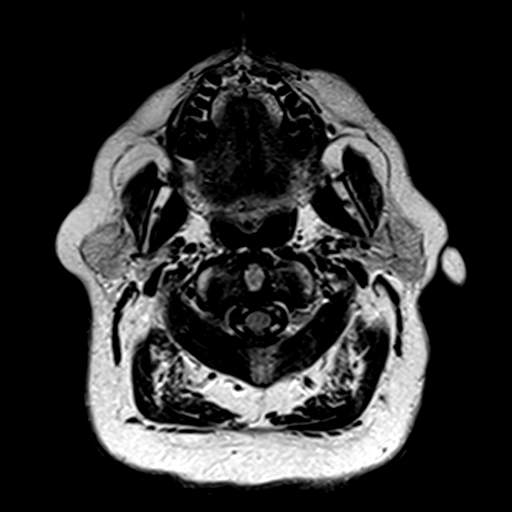
[im 11/42]
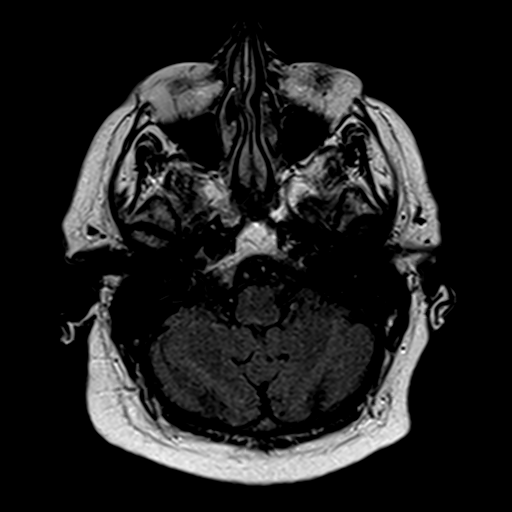
[im 21/42]
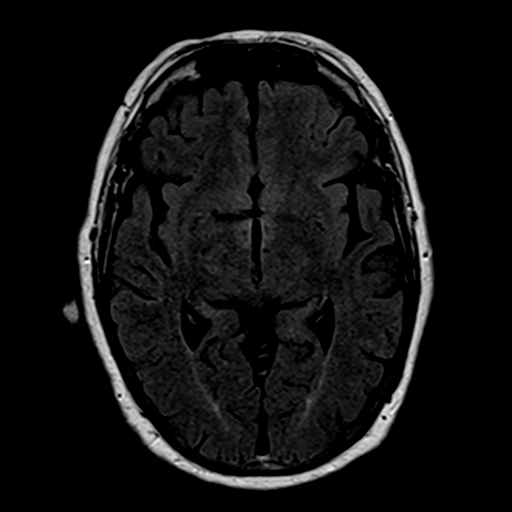
[im 31/42]
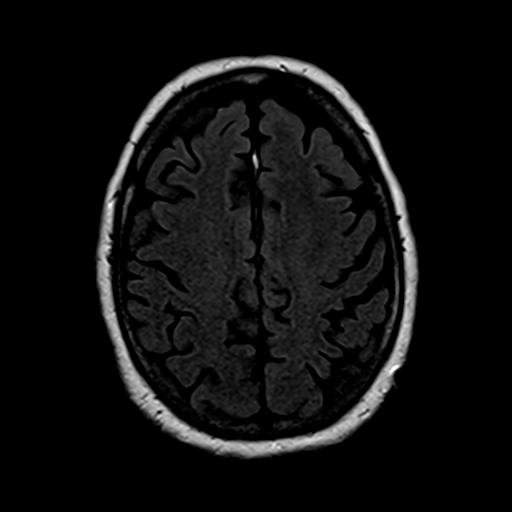
[im 42/42]
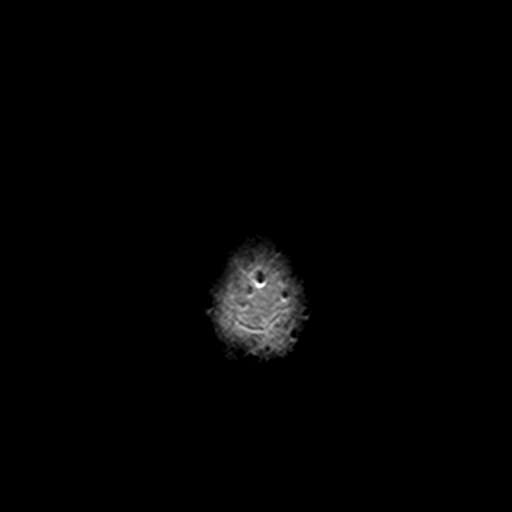

[Series 9: t1_3d_tra · axial · 2.0mm · 0.94mm/px · z∈[-52,+135]mm · 12 of 96 slices shown]
[im 1/96]
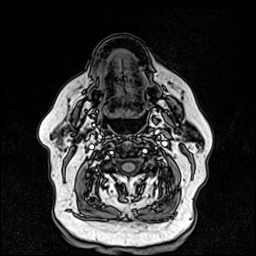
[im 9/96]
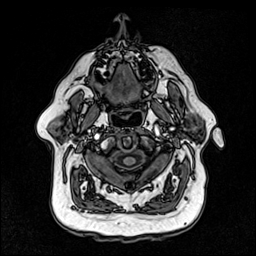
[im 18/96]
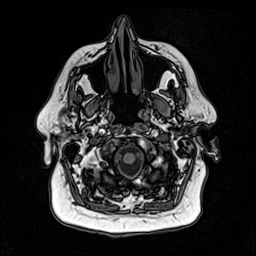
[im 26/96]
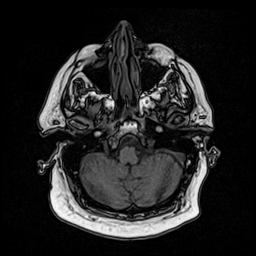
[im 35/96]
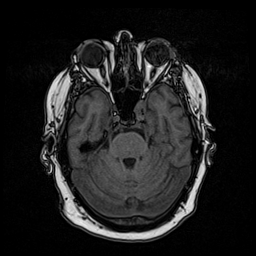
[im 44/96]
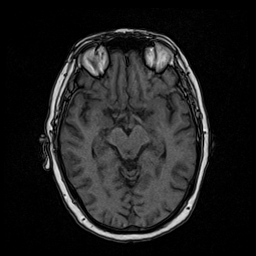
[im 52/96]
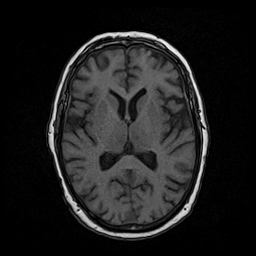
[im 61/96]
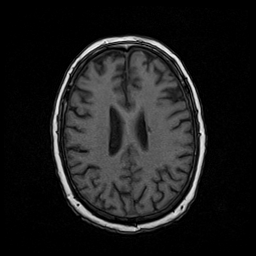
[im 70/96]
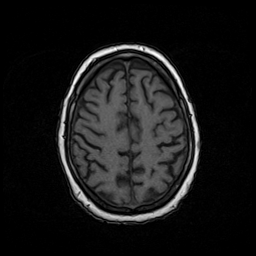
[im 78/96]
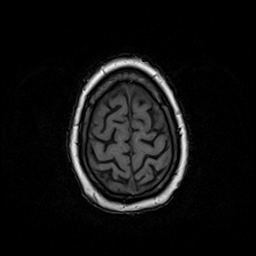
[im 87/96]
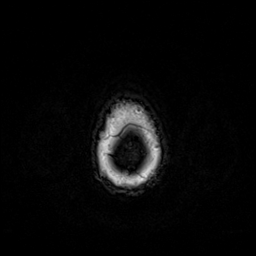
[im 96/96]
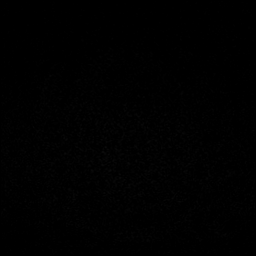

[Series 10: T2 · coronal · 5.0mm · 0.69mm/px · 4 of 30 slices shown (3 of 3)]
[im 1/30]
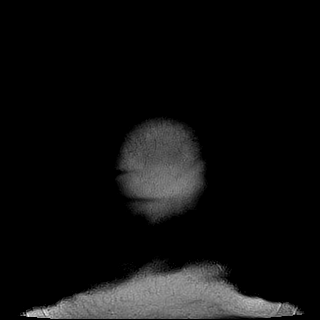
[im 10/30]
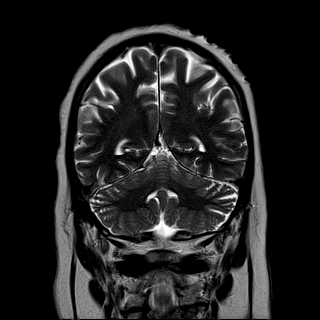
[im 20/30]
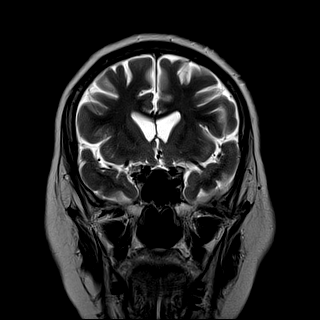
[im 30/30]
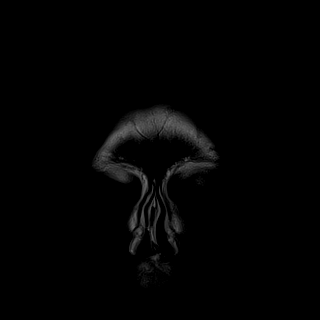

[48 of 48 positions shown; findings below may reference images not displayed]

FINDINGS: Brain: No acute infarct, mass effect or extra-axial collection. No
acute or chronic hemorrhage. Normal white matter signal, parenchymal
volume and CSF spaces. The midline structures are normal. Old
punctate small vessel infarct in the left superior periventricular
white matter.

Vascular: Major flow voids are preserved.

Skull and upper cervical spine: Normal calvarium and skull base.
Visualized upper cervical spine and soft tissues are normal.

Sinuses/Orbits:No paranasal sinus fluid levels or advanced mucosal
thickening. No mastoid or middle ear effusion. Normal orbits.
IMPRESSION: Old punctate small vessel infarct in the superior left hemisphere
white matter. Otherwise normal MRI of the brain.

## 2023-06-06 DIAGNOSIS — I2510 Atherosclerotic heart disease of native coronary artery without angina pectoris: Secondary | ICD-10-CM | POA: Diagnosis not present

## 2023-06-06 DIAGNOSIS — I10 Essential (primary) hypertension: Secondary | ICD-10-CM | POA: Diagnosis not present

## 2023-06-06 DIAGNOSIS — E782 Mixed hyperlipidemia: Secondary | ICD-10-CM | POA: Diagnosis not present

## 2023-06-06 DIAGNOSIS — I739 Peripheral vascular disease, unspecified: Secondary | ICD-10-CM | POA: Diagnosis not present

## 2023-06-06 DIAGNOSIS — I480 Paroxysmal atrial fibrillation: Secondary | ICD-10-CM | POA: Diagnosis not present

## 2023-06-10 DIAGNOSIS — M50122 Cervical disc disorder at C5-C6 level with radiculopathy: Secondary | ICD-10-CM | POA: Diagnosis not present

## 2023-06-10 DIAGNOSIS — M5412 Radiculopathy, cervical region: Secondary | ICD-10-CM | POA: Diagnosis not present

## 2023-06-10 DIAGNOSIS — M50121 Cervical disc disorder at C4-C5 level with radiculopathy: Secondary | ICD-10-CM | POA: Diagnosis not present

## 2023-06-10 DIAGNOSIS — M50123 Cervical disc disorder at C6-C7 level with radiculopathy: Secondary | ICD-10-CM | POA: Diagnosis not present

## 2023-06-10 DIAGNOSIS — M5011 Cervical disc disorder with radiculopathy,  high cervical region: Secondary | ICD-10-CM | POA: Diagnosis not present

## 2023-06-17 DIAGNOSIS — Z981 Arthrodesis status: Secondary | ICD-10-CM | POA: Diagnosis not present

## 2023-06-17 DIAGNOSIS — M4316 Spondylolisthesis, lumbar region: Secondary | ICD-10-CM | POA: Diagnosis not present

## 2023-06-17 DIAGNOSIS — M47816 Spondylosis without myelopathy or radiculopathy, lumbar region: Secondary | ICD-10-CM | POA: Diagnosis not present

## 2023-06-17 DIAGNOSIS — G894 Chronic pain syndrome: Secondary | ICD-10-CM | POA: Diagnosis not present

## 2023-06-17 DIAGNOSIS — M5459 Other low back pain: Secondary | ICD-10-CM | POA: Diagnosis not present

## 2023-06-18 DIAGNOSIS — E119 Type 2 diabetes mellitus without complications: Secondary | ICD-10-CM | POA: Diagnosis not present

## 2023-06-18 DIAGNOSIS — I10 Essential (primary) hypertension: Secondary | ICD-10-CM | POA: Diagnosis not present

## 2023-06-18 DIAGNOSIS — M545 Low back pain, unspecified: Secondary | ICD-10-CM | POA: Diagnosis not present

## 2023-06-18 DIAGNOSIS — G8911 Acute pain due to trauma: Secondary | ICD-10-CM | POA: Diagnosis not present

## 2023-06-18 DIAGNOSIS — Z9889 Other specified postprocedural states: Secondary | ICD-10-CM | POA: Diagnosis not present

## 2023-06-18 DIAGNOSIS — R269 Unspecified abnormalities of gait and mobility: Secondary | ICD-10-CM | POA: Diagnosis not present

## 2023-06-18 DIAGNOSIS — M5459 Other low back pain: Secondary | ICD-10-CM | POA: Diagnosis not present

## 2023-06-18 DIAGNOSIS — Z87891 Personal history of nicotine dependence: Secondary | ICD-10-CM | POA: Diagnosis not present

## 2023-06-19 DIAGNOSIS — T148XXD Other injury of unspecified body region, subsequent encounter: Secondary | ICD-10-CM | POA: Diagnosis not present

## 2023-06-19 DIAGNOSIS — L97919 Non-pressure chronic ulcer of unspecified part of right lower leg with unspecified severity: Secondary | ICD-10-CM | POA: Diagnosis not present

## 2023-06-19 DIAGNOSIS — T8131XA Disruption of external operation (surgical) wound, not elsewhere classified, initial encounter: Secondary | ICD-10-CM | POA: Diagnosis not present

## 2023-06-19 DIAGNOSIS — I83029 Varicose veins of left lower extremity with ulcer of unspecified site: Secondary | ICD-10-CM | POA: Diagnosis not present

## 2023-06-19 DIAGNOSIS — L97929 Non-pressure chronic ulcer of unspecified part of left lower leg with unspecified severity: Secondary | ICD-10-CM | POA: Diagnosis not present

## 2023-06-19 DIAGNOSIS — I83019 Varicose veins of right lower extremity with ulcer of unspecified site: Secondary | ICD-10-CM | POA: Diagnosis not present

## 2023-06-19 DIAGNOSIS — E1142 Type 2 diabetes mellitus with diabetic polyneuropathy: Secondary | ICD-10-CM | POA: Diagnosis not present

## 2023-06-20 DIAGNOSIS — Z9889 Other specified postprocedural states: Secondary | ICD-10-CM | POA: Diagnosis not present

## 2023-06-20 DIAGNOSIS — M549 Dorsalgia, unspecified: Secondary | ICD-10-CM | POA: Diagnosis not present

## 2023-06-20 DIAGNOSIS — Z87891 Personal history of nicotine dependence: Secondary | ICD-10-CM | POA: Diagnosis not present

## 2023-06-20 DIAGNOSIS — M545 Low back pain, unspecified: Secondary | ICD-10-CM | POA: Diagnosis not present

## 2023-06-20 DIAGNOSIS — S31000A Unspecified open wound of lower back and pelvis without penetration into retroperitoneum, initial encounter: Secondary | ICD-10-CM | POA: Diagnosis not present

## 2023-06-23 ENCOUNTER — Telehealth: Payer: Self-pay

## 2023-06-23 NOTE — Transitions of Care (Post Inpatient/ED Visit) (Signed)
 06/23/2023  Name: Franklin Thornton MRN: [id number] DOB: [birth year]  Today's TOC FU Call Status: Today's TOC FU Call Status:: Successful TOC FU Call Completed TOC FU Call Complete Date: 06/23/23 Patient's Name and Date of Birth confirmed.  Transition Care Management Follow-up Telephone Call Date of Discharge: 06/20/23 Discharge Facility: Other ([facility]) Name of Other ([facility]) Discharge Facility: [facility] Type of Discharge: Emergency Department Reason for ED Visit:  (lower back pain) How have you been since you were released from the hospital?: Better Any questions or concerns?: No  Items Reviewed: Did you receive and understand the discharge instructions provided?: Yes Medications obtained,verified, and reconciled?: Yes (Medications Reviewed) Any new allergies since your discharge?: No Dietary orders reviewed?: NA Do you have support at home?: Yes People in Home: spouse  Medications Reviewed Today: Medications Reviewed Today     Reviewed by Leigh Aurora, CMA (Certified Medical Assistant) on 06/23/23 at 1157  Med List Status: <None>   Medication Order Taking? Sig Documenting Provider Last Dose Status Informant  albuterol (PROVENTIL HFA;VENTOLIN HFA) 108 (90 Base) MCG/ACT inhaler [id number]  Inhale 2 puffs into the lungs every 6 (six) hours as needed for wheezing or shortness of breath. [provider]  Active   albuterol (PROVENTIL) (2.5 MG/3ML) 0.083% nebulizer solution [id number]  Take 3 mLs (2.5 mg total) by nebulization every 6 (six) hours as needed for wheezing or shortness of breath. Arnette Felts, FNP  Active   amiodarone (PACERONE) 100 MG tablet [id number]  Take 100 mg by mouth daily.  [provider]  Active   amphetamine-dextroamphetamine (ADDERALL) 10 MG tablet [id number]  Take 1 tablet by mouth daily. Take one 30mg  tablet by mouth two times daily. [provider]  Active Self  Armodafinil 250 MG tablet [id number]  Take 125-250 mg by  mouth every morning. [provider]  Active   ascorbic acid (VITAMIN C) 500 MG tablet [id number]  Take 500 mg by mouth daily. [provider]  Active   aspirin 81 MG tablet [id number]  Take 81 mg by mouth daily. [provider]  Active   atorvastatin (LIPITOR) 40 MG tablet [id number]  TAKE 1 TABLET DAILY [name], MD  Active   azelastine (ASTELIN) 0.1 % nasal spray [id number]  Place 1 spray into both nostrils 2 (two) times daily. Use in each nostril as directed [name], MD  Active   b complex vitamins capsule [id number]  Take 1 capsule by mouth daily. [provider]  Active   carvedilol (COREG) 6.25 MG tablet [id number]  Take 6.25 mg by mouth 2 (two) times daily with a meal. [provider]  Active   Cholecalciferol (VITAMIN D) 2000 units tablet [id number]  Take 2,000 Units by mouth daily. [provider]  Active   Continuous Glucose Sensor (DEXCOM G6 SENSOR) MISC [id number]  by Does not apply route. [provider]  Active   cyanocobalamin ((VITAMIN B-12)) injection 1,000 mcg [id number]   [name], MD  Active   diphenhydrAMINE (BENADRYL) 25 MG tablet [id number]  Take 25 mg by mouth every 6 (six) hours as needed for itching.  [provider]  Active   donepezil (ARICEPT) 10 MG tablet [id number]  TAKE 1 TABLET AT BEDTIME [name], MD  Active   DULoxetine (CYMBALTA) 30 MG capsule [id number]  TAKE 1 CAPSULE DAILY IN THE Josephine Igo, MD  Active   empagliflozin (JARDIANCE) 10 MG TABS tablet [id number]  Take 1 tablet (10 mg total) by mouth daily.  Patient  taking differently: Take 10 mg by mouth daily. Pt states taking 25mg    [name], MD  Active Self  escitalopram (LEXAPRO) 20 MG tablet [id number]  Take 20 mg by mouth daily. [provider]  Active   fluorouracil (EFUDEX) 5 % cream [id number]  Apply topically 2 (two) times daily. [provider]  Active   furosemide (LASIX) 20 MG tablet  [id number]  Take 20 mg by mouth daily as needed.  [provider]  Active   Glucagon (GVOKE HYPOPEN 2-PACK) 0.5 MG/0.1ML SOAJ [id number]  Inject 0.5 mg into the skin as directed. Ellender Hose, NP  Active   glucose blood (FREESTYLE LITE) test strip [id number]  Use as instructed to check blood sugars  3 times per day dx: e11.65 [name], MD  Active   glucose monitoring kit (FREESTYLE) monitoring kit [id number]  1 each by Does not apply route as needed for other. [provider]  Active   HYDROcodone-acetaminophen (NORCO/VICODIN) 5-325 MG tablet [id number] Yes Take 1 tablet by mouth every 6 (six) hours as needed. [provider]  Active   insulin lispro (HUMALOG KWIKPEN) 100 UNIT/ML KwikPen [id number]  3 Units daily as needed. Inject 3 units for every 50 points over 200 if BG > 200 before meals. Max daily dose: 20 units. [provider]  Active Self  Insulin Pen Needle (NOVOFINE PEN NEEDLE) 32G X 6 MM MISC [id number]  Use with insulin pens [name], MD  Active   ipratropium (ATROVENT) 0.03 % nasal spray [id number]  Place 2 sprays into both nostrils every 12 (twelve) hours. [provider]  Active   LANTUS SOLOSTAR 100 UNIT/ML Solostar Pen [id number]  Inject 24 Units into the skin daily. Pt taking 24units-reported on 09/18/22 [provider]  Active Self  levocetirizine (XYZAL) 5 MG tablet [id number]  Take 5 mg by mouth every evening.  [provider]  Active   levothyroxine (SYNTHROID) 50 MCG tablet [id number]  Take 1 tablet (50 mcg total) by mouth daily. [name], MD  Active   lidocaine (LIDODERM) 5 % [id number]  Place 3 patches onto the skin daily. May use 1-3 patches at one time, Remove & Discard patch within 12 hours or as directed by MD [name], MD  Active   losartan (COZAAR) 50 MG tablet [id number]  Take 50 mg by mouth 2 (two) times daily.  [provider]  Active   Magnesium 250 MG TABS [id number]  Take 1 tablet by mouth  daily. [provider]  Active   methocarbamol (ROBAXIN) 750 MG tablet [id number]  Take 750 mg by mouth 3 (three) times daily as needed for muscle spasms. [provider]  Active   mirabegron ER (MYRBETRIQ) 50 MG TB24 tablet [id number]  Take 50 mg by mouth daily. [provider]  Active   montelukast (SINGULAIR) 10 MG tablet [id number]  Take 10 mg by mouth at bedtime. [provider]  Active   Multiple Vitamin (MULTIVITAMIN) tablet [id number]  Take 1 tablet by mouth daily. [provider]  Active   nitroGLYCERIN (NITROSTAT) 0.4 MG SL tablet [id number]  Place 1 tablet (0.4 mg total) under the tongue every 5 (five) minutes as needed for chest pain. [name], MD  Active   Omega-3 Fatty Acids (FISH OIL) 1200 MG CAPS [id number]  Take 1,200 mg by mouth.  [provider]  Active   pantoprazole (PROTONIX) 40 MG tablet [id number]  TAKE 1 TABLET DAILY [name], MD  Active   rivaroxaban Carlena Hurl)  10 MG TABS tablet [id number]  Take 10 mg by mouth daily. Take one tablet (15 mg dose) by mouth daily. Take with Dinner. [provider]  Active Self  rOPINIRole (REQUIP) 1 MG tablet [id number]  One tab po six times daily [name], MD  Active   solifenacin (VESICARE) 5 MG tablet [id number]  Take 1 tablet (5 mg total) by mouth daily. [name], MD  Active   tiZANidine (ZANAFLEX) 4 MG tablet [id number] Yes Take 4 mg by mouth once. [provider]  Active   vitamin B-12 (CYANOCOBALAMIN) 1000 MCG tablet [id number]  Take 1,000 mcg by mouth daily. [provider]  Active            Med Note Harlan Stains   Tue Mar 20, 2021  2:43 PM)    Monte Fantasia INHUB 100-50 MCG/ACT AEPB [id number]  Inhale 1 puff into the lungs 2 (two) times daily. Arnette Felts, FNP  Active   Med List Note Allyne Gee, Melina Schools, MD 12/06/20 1448): NOrco prescribed by the [facility] Care and Equipment/Supplies: Were Home Health Services Ordered?: NA Any new  equipment or medical supplies ordered?: NA  Functional Questionnaire: Do you need assistance with bathing/showering or dressing?: No Do you need assistance with meal preparation?: No Do you need assistance with eating?: No Do you have difficulty maintaining continence: No Do you need assistance with getting out of bed/getting out of a chair/moving?: No Do you have difficulty managing or taking your medications?: No  Follow up appointments reviewed: PCP Follow-up appointment confirmed?: NA (note sent to PCP- patient is requesting in home visit) Specialist Hospital Follow-up appointment confirmed?: Yes Date of Specialist follow-up appointment?: 07/08/23 Follow-Up Specialty Provider:: Dr. Lorenso Courier Do you need transportation to your follow-up appointment?: No Do you understand care options if your condition(s) worsen?: Yes-patient verbalized understanding    SIGNATURE Agnes Lawrence, CMA (AAMA)  [facility] [phone]

## 2023-06-24 DIAGNOSIS — K5900 Constipation, unspecified: Secondary | ICD-10-CM | POA: Diagnosis not present

## 2023-06-24 DIAGNOSIS — R7989 Other specified abnormal findings of blood chemistry: Secondary | ICD-10-CM | POA: Diagnosis not present

## 2023-06-24 DIAGNOSIS — R64 Cachexia: Secondary | ICD-10-CM | POA: Diagnosis not present

## 2023-06-24 DIAGNOSIS — T8463XA Infection and inflammatory reaction due to internal fixation device of spine, initial encounter: Secondary | ICD-10-CM | POA: Diagnosis not present

## 2023-06-24 DIAGNOSIS — M5136 Other intervertebral disc degeneration, lumbar region with discogenic back pain only: Secondary | ICD-10-CM | POA: Diagnosis not present

## 2023-06-24 DIAGNOSIS — F418 Other specified anxiety disorders: Secondary | ICD-10-CM | POA: Diagnosis not present

## 2023-06-24 DIAGNOSIS — Z981 Arthrodesis status: Secondary | ICD-10-CM | POA: Diagnosis not present

## 2023-06-24 DIAGNOSIS — G40219 Localization-related (focal) (partial) symptomatic epilepsy and epileptic syndromes with complex partial seizures, intractable, without status epilepticus: Secondary | ICD-10-CM | POA: Diagnosis not present

## 2023-06-24 DIAGNOSIS — F32A Depression, unspecified: Secondary | ICD-10-CM | POA: Diagnosis not present

## 2023-06-24 DIAGNOSIS — K219 Gastro-esophageal reflux disease without esophagitis: Secondary | ICD-10-CM | POA: Diagnosis not present

## 2023-06-24 DIAGNOSIS — M5440 Lumbago with sciatica, unspecified side: Secondary | ICD-10-CM | POA: Diagnosis not present

## 2023-06-24 DIAGNOSIS — M47816 Spondylosis without myelopathy or radiculopathy, lumbar region: Secondary | ICD-10-CM | POA: Diagnosis not present

## 2023-06-24 DIAGNOSIS — R262 Difficulty in walking, not elsewhere classified: Secondary | ICD-10-CM | POA: Diagnosis not present

## 2023-06-24 DIAGNOSIS — T8131XA Disruption of external operation (surgical) wound, not elsewhere classified, initial encounter: Secondary | ICD-10-CM | POA: Diagnosis not present

## 2023-06-24 DIAGNOSIS — Z0189 Encounter for other specified special examinations: Secondary | ICD-10-CM | POA: Diagnosis not present

## 2023-06-24 DIAGNOSIS — E782 Mixed hyperlipidemia: Secondary | ICD-10-CM | POA: Diagnosis not present

## 2023-06-24 DIAGNOSIS — G4733 Obstructive sleep apnea (adult) (pediatric): Secondary | ICD-10-CM | POA: Diagnosis not present

## 2023-06-24 DIAGNOSIS — E1142 Type 2 diabetes mellitus with diabetic polyneuropathy: Secondary | ICD-10-CM | POA: Diagnosis not present

## 2023-06-24 DIAGNOSIS — I480 Paroxysmal atrial fibrillation: Secondary | ICD-10-CM | POA: Diagnosis not present

## 2023-06-24 DIAGNOSIS — G2581 Restless legs syndrome: Secondary | ICD-10-CM | POA: Diagnosis not present

## 2023-06-24 DIAGNOSIS — R0602 Shortness of breath: Secondary | ICD-10-CM | POA: Diagnosis not present

## 2023-06-24 DIAGNOSIS — R7881 Bacteremia: Secondary | ICD-10-CM | POA: Diagnosis not present

## 2023-06-24 DIAGNOSIS — J45998 Other asthma: Secondary | ICD-10-CM | POA: Diagnosis not present

## 2023-06-24 DIAGNOSIS — Z969 Presence of functional implant, unspecified: Secondary | ICD-10-CM | POA: Diagnosis not present

## 2023-06-24 DIAGNOSIS — T8463XS Infection and inflammatory reaction due to internal fixation device of spine, sequela: Secondary | ICD-10-CM | POA: Diagnosis not present

## 2023-06-24 DIAGNOSIS — Z7689 Persons encountering health services in other specified circumstances: Secondary | ICD-10-CM | POA: Diagnosis not present

## 2023-06-24 DIAGNOSIS — Z95828 Presence of other vascular implants and grafts: Secondary | ICD-10-CM | POA: Diagnosis not present

## 2023-06-24 DIAGNOSIS — M48061 Spinal stenosis, lumbar region without neurogenic claudication: Secondary | ICD-10-CM | POA: Diagnosis not present

## 2023-06-24 DIAGNOSIS — N182 Chronic kidney disease, stage 2 (mild): Secondary | ICD-10-CM | POA: Diagnosis not present

## 2023-06-24 DIAGNOSIS — T8189XA Other complications of procedures, not elsewhere classified, initial encounter: Secondary | ICD-10-CM | POA: Diagnosis not present

## 2023-06-24 DIAGNOSIS — I2510 Atherosclerotic heart disease of native coronary artery without angina pectoris: Secondary | ICD-10-CM | POA: Diagnosis not present

## 2023-06-24 DIAGNOSIS — R162 Hepatomegaly with splenomegaly, not elsewhere classified: Secondary | ICD-10-CM | POA: Diagnosis not present

## 2023-06-24 DIAGNOSIS — G40119 Localization-related (focal) (partial) symptomatic epilepsy and epileptic syndromes with simple partial seizures, intractable, without status epilepticus: Secondary | ICD-10-CM | POA: Diagnosis not present

## 2023-06-24 DIAGNOSIS — F4310 Post-traumatic stress disorder, unspecified: Secondary | ICD-10-CM | POA: Diagnosis not present

## 2023-06-24 DIAGNOSIS — B9561 Methicillin susceptible Staphylococcus aureus infection as the cause of diseases classified elsewhere: Secondary | ICD-10-CM | POA: Diagnosis not present

## 2023-06-24 DIAGNOSIS — K6819 Other retroperitoneal abscess: Secondary | ICD-10-CM | POA: Diagnosis not present

## 2023-06-24 DIAGNOSIS — M4646 Discitis, unspecified, lumbar region: Secondary | ICD-10-CM | POA: Diagnosis not present

## 2023-06-24 DIAGNOSIS — M4150 Other secondary scoliosis, site unspecified: Secondary | ICD-10-CM | POA: Diagnosis not present

## 2023-06-24 DIAGNOSIS — M47817 Spondylosis without myelopathy or radiculopathy, lumbosacral region: Secondary | ICD-10-CM | POA: Diagnosis not present

## 2023-06-24 DIAGNOSIS — E1165 Type 2 diabetes mellitus with hyperglycemia: Secondary | ICD-10-CM | POA: Diagnosis not present

## 2023-06-24 DIAGNOSIS — J9621 Acute and chronic respiratory failure with hypoxia: Secondary | ICD-10-CM | POA: Diagnosis not present

## 2023-06-24 DIAGNOSIS — J90 Pleural effusion, not elsewhere classified: Secondary | ICD-10-CM | POA: Diagnosis not present

## 2023-06-24 DIAGNOSIS — F411 Generalized anxiety disorder: Secondary | ICD-10-CM | POA: Diagnosis not present

## 2023-06-24 DIAGNOSIS — M5137 Other intervertebral disc degeneration, lumbosacral region with discogenic back pain only: Secondary | ICD-10-CM | POA: Diagnosis not present

## 2023-06-24 DIAGNOSIS — R41841 Cognitive communication deficit: Secondary | ICD-10-CM | POA: Diagnosis not present

## 2023-06-24 DIAGNOSIS — I4891 Unspecified atrial fibrillation: Secondary | ICD-10-CM | POA: Diagnosis not present

## 2023-06-24 DIAGNOSIS — G8929 Other chronic pain: Secondary | ICD-10-CM | POA: Diagnosis not present

## 2023-06-24 DIAGNOSIS — E869 Volume depletion, unspecified: Secondary | ICD-10-CM | POA: Diagnosis not present

## 2023-06-24 DIAGNOSIS — E119 Type 2 diabetes mellitus without complications: Secondary | ICD-10-CM | POA: Diagnosis not present

## 2023-06-24 DIAGNOSIS — M4626 Osteomyelitis of vertebra, lumbar region: Secondary | ICD-10-CM | POA: Diagnosis not present

## 2023-06-24 DIAGNOSIS — R278 Other lack of coordination: Secondary | ICD-10-CM | POA: Diagnosis not present

## 2023-06-24 DIAGNOSIS — R0989 Other specified symptoms and signs involving the circulatory and respiratory systems: Secondary | ICD-10-CM | POA: Diagnosis not present

## 2023-06-24 DIAGNOSIS — J9611 Chronic respiratory failure with hypoxia: Secondary | ICD-10-CM | POA: Diagnosis not present

## 2023-06-24 DIAGNOSIS — A419 Sepsis, unspecified organism: Secondary | ICD-10-CM | POA: Diagnosis not present

## 2023-06-24 DIAGNOSIS — R079 Chest pain, unspecified: Secondary | ICD-10-CM | POA: Diagnosis not present

## 2023-06-24 DIAGNOSIS — J45909 Unspecified asthma, uncomplicated: Secondary | ICD-10-CM | POA: Diagnosis not present

## 2023-06-24 DIAGNOSIS — I700 Atherosclerosis of aorta: Secondary | ICD-10-CM | POA: Diagnosis not present

## 2023-06-24 DIAGNOSIS — E441 Mild protein-calorie malnutrition: Secondary | ICD-10-CM | POA: Diagnosis not present

## 2023-06-24 DIAGNOSIS — M6281 Muscle weakness (generalized): Secondary | ICD-10-CM | POA: Diagnosis not present

## 2023-06-24 DIAGNOSIS — Z794 Long term (current) use of insulin: Secondary | ICD-10-CM | POA: Diagnosis not present

## 2023-06-24 DIAGNOSIS — E1122 Type 2 diabetes mellitus with diabetic chronic kidney disease: Secondary | ICD-10-CM | POA: Diagnosis not present

## 2023-06-24 DIAGNOSIS — A4101 Sepsis due to Methicillin susceptible Staphylococcus aureus: Secondary | ICD-10-CM | POA: Diagnosis not present

## 2023-06-24 DIAGNOSIS — R918 Other nonspecific abnormal finding of lung field: Secondary | ICD-10-CM | POA: Diagnosis not present

## 2023-06-24 DIAGNOSIS — R748 Abnormal levels of other serum enzymes: Secondary | ICD-10-CM | POA: Diagnosis not present

## 2023-06-24 DIAGNOSIS — M5116 Intervertebral disc disorders with radiculopathy, lumbar region: Secondary | ICD-10-CM | POA: Diagnosis not present

## 2023-06-24 DIAGNOSIS — Z48811 Encounter for surgical aftercare following surgery on the nervous system: Secondary | ICD-10-CM | POA: Diagnosis not present

## 2023-06-24 DIAGNOSIS — I10 Essential (primary) hypertension: Secondary | ICD-10-CM | POA: Diagnosis not present

## 2023-06-24 DIAGNOSIS — E785 Hyperlipidemia, unspecified: Secondary | ICD-10-CM | POA: Diagnosis not present

## 2023-06-24 DIAGNOSIS — M545 Low back pain, unspecified: Secondary | ICD-10-CM | POA: Diagnosis not present

## 2023-06-24 DIAGNOSIS — E871 Hypo-osmolality and hyponatremia: Secondary | ICD-10-CM | POA: Diagnosis not present

## 2023-06-24 DIAGNOSIS — M5459 Other low back pain: Secondary | ICD-10-CM | POA: Diagnosis not present

## 2023-06-24 DIAGNOSIS — Z6827 Body mass index (BMI) 27.0-27.9, adult: Secondary | ICD-10-CM | POA: Diagnosis not present

## 2023-06-24 DIAGNOSIS — K5909 Other constipation: Secondary | ICD-10-CM | POA: Diagnosis not present

## 2023-06-24 DIAGNOSIS — M4636 Infection of intervertebral disc (pyogenic), lumbar region: Secondary | ICD-10-CM | POA: Diagnosis not present

## 2023-06-25 DIAGNOSIS — M5136 Other intervertebral disc degeneration, lumbar region with discogenic back pain only: Secondary | ICD-10-CM | POA: Diagnosis not present

## 2023-06-25 DIAGNOSIS — M47817 Spondylosis without myelopathy or radiculopathy, lumbosacral region: Secondary | ICD-10-CM | POA: Diagnosis not present

## 2023-06-25 DIAGNOSIS — M5137 Other intervertebral disc degeneration, lumbosacral region with discogenic back pain only: Secondary | ICD-10-CM | POA: Diagnosis not present

## 2023-06-25 DIAGNOSIS — M47816 Spondylosis without myelopathy or radiculopathy, lumbar region: Secondary | ICD-10-CM | POA: Diagnosis not present

## 2023-06-26 DIAGNOSIS — R7881 Bacteremia: Secondary | ICD-10-CM | POA: Diagnosis not present

## 2023-06-26 DIAGNOSIS — B9561 Methicillin susceptible Staphylococcus aureus infection as the cause of diseases classified elsewhere: Secondary | ICD-10-CM | POA: Diagnosis not present

## 2023-06-26 DIAGNOSIS — Z0189 Encounter for other specified special examinations: Secondary | ICD-10-CM | POA: Diagnosis not present

## 2023-06-26 DIAGNOSIS — R162 Hepatomegaly with splenomegaly, not elsewhere classified: Secondary | ICD-10-CM | POA: Diagnosis not present

## 2023-06-26 DIAGNOSIS — M4150 Other secondary scoliosis, site unspecified: Secondary | ICD-10-CM | POA: Diagnosis not present

## 2023-06-26 DIAGNOSIS — N182 Chronic kidney disease, stage 2 (mild): Secondary | ICD-10-CM | POA: Diagnosis not present

## 2023-06-26 DIAGNOSIS — T8189XA Other complications of procedures, not elsewhere classified, initial encounter: Secondary | ICD-10-CM | POA: Diagnosis not present

## 2023-06-26 DIAGNOSIS — M5459 Other low back pain: Secondary | ICD-10-CM | POA: Diagnosis not present

## 2023-06-26 DIAGNOSIS — E1122 Type 2 diabetes mellitus with diabetic chronic kidney disease: Secondary | ICD-10-CM | POA: Diagnosis not present

## 2023-06-26 DIAGNOSIS — G8929 Other chronic pain: Secondary | ICD-10-CM | POA: Diagnosis not present

## 2023-06-26 DIAGNOSIS — M5440 Lumbago with sciatica, unspecified side: Secondary | ICD-10-CM | POA: Diagnosis not present

## 2023-06-26 DIAGNOSIS — Z794 Long term (current) use of insulin: Secondary | ICD-10-CM | POA: Diagnosis not present

## 2023-06-27 DIAGNOSIS — B9561 Methicillin susceptible Staphylococcus aureus infection as the cause of diseases classified elsewhere: Secondary | ICD-10-CM | POA: Diagnosis not present

## 2023-06-27 DIAGNOSIS — Z794 Long term (current) use of insulin: Secondary | ICD-10-CM | POA: Diagnosis not present

## 2023-06-27 DIAGNOSIS — N182 Chronic kidney disease, stage 2 (mild): Secondary | ICD-10-CM | POA: Diagnosis not present

## 2023-06-27 DIAGNOSIS — I480 Paroxysmal atrial fibrillation: Secondary | ICD-10-CM | POA: Diagnosis not present

## 2023-06-27 DIAGNOSIS — M4150 Other secondary scoliosis, site unspecified: Secondary | ICD-10-CM | POA: Diagnosis not present

## 2023-06-27 DIAGNOSIS — M5440 Lumbago with sciatica, unspecified side: Secondary | ICD-10-CM | POA: Diagnosis not present

## 2023-06-27 DIAGNOSIS — R7881 Bacteremia: Secondary | ICD-10-CM | POA: Diagnosis not present

## 2023-06-27 DIAGNOSIS — E1122 Type 2 diabetes mellitus with diabetic chronic kidney disease: Secondary | ICD-10-CM | POA: Diagnosis not present

## 2023-06-27 DIAGNOSIS — M5459 Other low back pain: Secondary | ICD-10-CM | POA: Diagnosis not present

## 2023-06-27 DIAGNOSIS — R7989 Other specified abnormal findings of blood chemistry: Secondary | ICD-10-CM | POA: Diagnosis not present

## 2023-06-27 DIAGNOSIS — E869 Volume depletion, unspecified: Secondary | ICD-10-CM | POA: Diagnosis not present

## 2023-06-27 DIAGNOSIS — G8929 Other chronic pain: Secondary | ICD-10-CM | POA: Diagnosis not present

## 2023-06-27 DIAGNOSIS — T8189XA Other complications of procedures, not elsewhere classified, initial encounter: Secondary | ICD-10-CM | POA: Diagnosis not present

## 2023-06-28 DIAGNOSIS — E869 Volume depletion, unspecified: Secondary | ICD-10-CM | POA: Diagnosis not present

## 2023-06-28 DIAGNOSIS — N182 Chronic kidney disease, stage 2 (mild): Secondary | ICD-10-CM | POA: Diagnosis not present

## 2023-06-28 DIAGNOSIS — R7989 Other specified abnormal findings of blood chemistry: Secondary | ICD-10-CM | POA: Diagnosis not present

## 2023-06-28 DIAGNOSIS — I480 Paroxysmal atrial fibrillation: Secondary | ICD-10-CM | POA: Diagnosis not present

## 2023-06-28 DIAGNOSIS — Z794 Long term (current) use of insulin: Secondary | ICD-10-CM | POA: Diagnosis not present

## 2023-06-28 DIAGNOSIS — E1122 Type 2 diabetes mellitus with diabetic chronic kidney disease: Secondary | ICD-10-CM | POA: Diagnosis not present

## 2023-06-29 DIAGNOSIS — R7881 Bacteremia: Secondary | ICD-10-CM | POA: Diagnosis not present

## 2023-06-29 DIAGNOSIS — R7989 Other specified abnormal findings of blood chemistry: Secondary | ICD-10-CM | POA: Diagnosis not present

## 2023-06-29 DIAGNOSIS — B9561 Methicillin susceptible Staphylococcus aureus infection as the cause of diseases classified elsewhere: Secondary | ICD-10-CM | POA: Diagnosis not present

## 2023-06-29 DIAGNOSIS — I480 Paroxysmal atrial fibrillation: Secondary | ICD-10-CM | POA: Diagnosis not present

## 2023-06-29 DIAGNOSIS — Z794 Long term (current) use of insulin: Secondary | ICD-10-CM | POA: Diagnosis not present

## 2023-06-29 DIAGNOSIS — E869 Volume depletion, unspecified: Secondary | ICD-10-CM | POA: Diagnosis not present

## 2023-06-29 DIAGNOSIS — A419 Sepsis, unspecified organism: Secondary | ICD-10-CM | POA: Diagnosis not present

## 2023-06-29 DIAGNOSIS — E1122 Type 2 diabetes mellitus with diabetic chronic kidney disease: Secondary | ICD-10-CM | POA: Diagnosis not present

## 2023-06-29 DIAGNOSIS — N182 Chronic kidney disease, stage 2 (mild): Secondary | ICD-10-CM | POA: Diagnosis not present

## 2023-06-29 DIAGNOSIS — M5459 Other low back pain: Secondary | ICD-10-CM | POA: Diagnosis not present

## 2023-06-30 DIAGNOSIS — M4646 Discitis, unspecified, lumbar region: Secondary | ICD-10-CM | POA: Diagnosis not present

## 2023-06-30 DIAGNOSIS — A4101 Sepsis due to Methicillin susceptible Staphylococcus aureus: Secondary | ICD-10-CM | POA: Diagnosis not present

## 2023-06-30 DIAGNOSIS — M5459 Other low back pain: Secondary | ICD-10-CM | POA: Diagnosis not present

## 2023-06-30 DIAGNOSIS — M4626 Osteomyelitis of vertebra, lumbar region: Secondary | ICD-10-CM | POA: Diagnosis not present

## 2023-06-30 DIAGNOSIS — I10 Essential (primary) hypertension: Secondary | ICD-10-CM | POA: Diagnosis not present

## 2023-07-01 DIAGNOSIS — M4646 Discitis, unspecified, lumbar region: Secondary | ICD-10-CM | POA: Diagnosis not present

## 2023-07-01 DIAGNOSIS — E1165 Type 2 diabetes mellitus with hyperglycemia: Secondary | ICD-10-CM | POA: Diagnosis not present

## 2023-07-01 DIAGNOSIS — M5459 Other low back pain: Secondary | ICD-10-CM | POA: Diagnosis not present

## 2023-07-01 DIAGNOSIS — A4101 Sepsis due to Methicillin susceptible Staphylococcus aureus: Secondary | ICD-10-CM | POA: Diagnosis not present

## 2023-07-01 DIAGNOSIS — I10 Essential (primary) hypertension: Secondary | ICD-10-CM | POA: Diagnosis not present

## 2023-07-01 DIAGNOSIS — M4626 Osteomyelitis of vertebra, lumbar region: Secondary | ICD-10-CM | POA: Diagnosis not present

## 2023-07-02 DIAGNOSIS — M5459 Other low back pain: Secondary | ICD-10-CM | POA: Diagnosis not present

## 2023-07-02 DIAGNOSIS — M4626 Osteomyelitis of vertebra, lumbar region: Secondary | ICD-10-CM | POA: Diagnosis not present

## 2023-07-02 DIAGNOSIS — E1165 Type 2 diabetes mellitus with hyperglycemia: Secondary | ICD-10-CM | POA: Diagnosis not present

## 2023-07-02 DIAGNOSIS — I10 Essential (primary) hypertension: Secondary | ICD-10-CM | POA: Diagnosis not present

## 2023-07-02 DIAGNOSIS — A4101 Sepsis due to Methicillin susceptible Staphylococcus aureus: Secondary | ICD-10-CM | POA: Diagnosis not present

## 2023-07-02 DIAGNOSIS — M4646 Discitis, unspecified, lumbar region: Secondary | ICD-10-CM | POA: Diagnosis not present

## 2023-07-03 DIAGNOSIS — A4101 Sepsis due to Methicillin susceptible Staphylococcus aureus: Secondary | ICD-10-CM | POA: Diagnosis not present

## 2023-07-03 DIAGNOSIS — M4626 Osteomyelitis of vertebra, lumbar region: Secondary | ICD-10-CM | POA: Diagnosis not present

## 2023-07-03 DIAGNOSIS — E1165 Type 2 diabetes mellitus with hyperglycemia: Secondary | ICD-10-CM | POA: Diagnosis not present

## 2023-07-03 DIAGNOSIS — M5459 Other low back pain: Secondary | ICD-10-CM | POA: Diagnosis not present

## 2023-07-03 DIAGNOSIS — M48061 Spinal stenosis, lumbar region without neurogenic claudication: Secondary | ICD-10-CM | POA: Diagnosis not present

## 2023-07-03 DIAGNOSIS — M4646 Discitis, unspecified, lumbar region: Secondary | ICD-10-CM | POA: Diagnosis not present

## 2023-07-03 DIAGNOSIS — I10 Essential (primary) hypertension: Secondary | ICD-10-CM | POA: Diagnosis not present

## 2023-07-04 DIAGNOSIS — I10 Essential (primary) hypertension: Secondary | ICD-10-CM | POA: Diagnosis not present

## 2023-07-04 DIAGNOSIS — A4101 Sepsis due to Methicillin susceptible Staphylococcus aureus: Secondary | ICD-10-CM | POA: Diagnosis not present

## 2023-07-04 DIAGNOSIS — M4626 Osteomyelitis of vertebra, lumbar region: Secondary | ICD-10-CM | POA: Diagnosis not present

## 2023-07-04 DIAGNOSIS — M5459 Other low back pain: Secondary | ICD-10-CM | POA: Diagnosis not present

## 2023-07-04 DIAGNOSIS — E1165 Type 2 diabetes mellitus with hyperglycemia: Secondary | ICD-10-CM | POA: Diagnosis not present

## 2023-07-04 DIAGNOSIS — M4646 Discitis, unspecified, lumbar region: Secondary | ICD-10-CM | POA: Diagnosis not present

## 2023-07-07 DIAGNOSIS — Z969 Presence of functional implant, unspecified: Secondary | ICD-10-CM | POA: Diagnosis not present

## 2023-07-07 DIAGNOSIS — A4101 Sepsis due to Methicillin susceptible Staphylococcus aureus: Secondary | ICD-10-CM | POA: Diagnosis not present

## 2023-07-07 DIAGNOSIS — M4626 Osteomyelitis of vertebra, lumbar region: Secondary | ICD-10-CM | POA: Diagnosis not present

## 2023-07-08 DIAGNOSIS — M4626 Osteomyelitis of vertebra, lumbar region: Secondary | ICD-10-CM | POA: Diagnosis not present

## 2023-07-08 DIAGNOSIS — Z969 Presence of functional implant, unspecified: Secondary | ICD-10-CM | POA: Diagnosis not present

## 2023-07-08 DIAGNOSIS — A4101 Sepsis due to Methicillin susceptible Staphylococcus aureus: Secondary | ICD-10-CM | POA: Diagnosis not present

## 2023-07-09 DIAGNOSIS — M4626 Osteomyelitis of vertebra, lumbar region: Secondary | ICD-10-CM | POA: Diagnosis not present

## 2023-07-09 DIAGNOSIS — A4101 Sepsis due to Methicillin susceptible Staphylococcus aureus: Secondary | ICD-10-CM | POA: Diagnosis not present

## 2023-07-09 DIAGNOSIS — Z969 Presence of functional implant, unspecified: Secondary | ICD-10-CM | POA: Diagnosis not present

## 2023-07-12 DIAGNOSIS — M4647 Discitis, unspecified, lumbosacral region: Secondary | ICD-10-CM | POA: Diagnosis not present

## 2023-07-12 DIAGNOSIS — R278 Other lack of coordination: Secondary | ICD-10-CM | POA: Diagnosis not present

## 2023-07-12 DIAGNOSIS — Z79899 Other long term (current) drug therapy: Secondary | ICD-10-CM | POA: Diagnosis not present

## 2023-07-12 DIAGNOSIS — L98422 Non-pressure chronic ulcer of back with fat layer exposed: Secondary | ICD-10-CM | POA: Diagnosis not present

## 2023-07-12 DIAGNOSIS — B9561 Methicillin susceptible Staphylococcus aureus infection as the cause of diseases classified elsewhere: Secondary | ICD-10-CM | POA: Diagnosis not present

## 2023-07-12 DIAGNOSIS — J9611 Chronic respiratory failure with hypoxia: Secondary | ICD-10-CM | POA: Diagnosis not present

## 2023-07-12 DIAGNOSIS — E1142 Type 2 diabetes mellitus with diabetic polyneuropathy: Secondary | ICD-10-CM | POA: Diagnosis not present

## 2023-07-12 DIAGNOSIS — J45998 Other asthma: Secondary | ICD-10-CM | POA: Diagnosis not present

## 2023-07-12 DIAGNOSIS — F909 Attention-deficit hyperactivity disorder, unspecified type: Secondary | ICD-10-CM | POA: Diagnosis not present

## 2023-07-12 DIAGNOSIS — Z7689 Persons encountering health services in other specified circumstances: Secondary | ICD-10-CM | POA: Diagnosis not present

## 2023-07-12 DIAGNOSIS — E890 Postprocedural hypothyroidism: Secondary | ICD-10-CM | POA: Diagnosis not present

## 2023-07-12 DIAGNOSIS — Z48811 Encounter for surgical aftercare following surgery on the nervous system: Secondary | ICD-10-CM | POA: Diagnosis not present

## 2023-07-12 DIAGNOSIS — I700 Atherosclerosis of aorta: Secondary | ICD-10-CM | POA: Diagnosis not present

## 2023-07-12 DIAGNOSIS — E785 Hyperlipidemia, unspecified: Secondary | ICD-10-CM | POA: Diagnosis not present

## 2023-07-12 DIAGNOSIS — R41841 Cognitive communication deficit: Secondary | ICD-10-CM | POA: Diagnosis not present

## 2023-07-12 DIAGNOSIS — R262 Difficulty in walking, not elsewhere classified: Secondary | ICD-10-CM | POA: Diagnosis not present

## 2023-07-12 DIAGNOSIS — G2581 Restless legs syndrome: Secondary | ICD-10-CM | POA: Diagnosis not present

## 2023-07-12 DIAGNOSIS — E1122 Type 2 diabetes mellitus with diabetic chronic kidney disease: Secondary | ICD-10-CM | POA: Diagnosis not present

## 2023-07-12 DIAGNOSIS — M545 Low back pain, unspecified: Secondary | ICD-10-CM | POA: Diagnosis not present

## 2023-07-12 DIAGNOSIS — A419 Sepsis, unspecified organism: Secondary | ICD-10-CM | POA: Diagnosis not present

## 2023-07-12 DIAGNOSIS — I25118 Atherosclerotic heart disease of native coronary artery with other forms of angina pectoris: Secondary | ICD-10-CM | POA: Diagnosis not present

## 2023-07-12 DIAGNOSIS — I10 Essential (primary) hypertension: Secondary | ICD-10-CM | POA: Diagnosis not present

## 2023-07-12 DIAGNOSIS — F418 Other specified anxiety disorders: Secondary | ICD-10-CM | POA: Diagnosis not present

## 2023-07-12 DIAGNOSIS — I4891 Unspecified atrial fibrillation: Secondary | ICD-10-CM | POA: Diagnosis not present

## 2023-07-12 DIAGNOSIS — G8929 Other chronic pain: Secondary | ICD-10-CM | POA: Diagnosis not present

## 2023-07-12 DIAGNOSIS — I469 Cardiac arrest, cause unspecified: Secondary | ICD-10-CM | POA: Diagnosis not present

## 2023-07-12 DIAGNOSIS — R7881 Bacteremia: Secondary | ICD-10-CM | POA: Diagnosis not present

## 2023-07-12 DIAGNOSIS — M5440 Lumbago with sciatica, unspecified side: Secondary | ICD-10-CM | POA: Diagnosis not present

## 2023-07-12 DIAGNOSIS — F331 Major depressive disorder, recurrent, moderate: Secondary | ICD-10-CM | POA: Diagnosis not present

## 2023-07-12 DIAGNOSIS — Z794 Long term (current) use of insulin: Secondary | ICD-10-CM | POA: Diagnosis not present

## 2023-07-12 DIAGNOSIS — I2510 Atherosclerotic heart disease of native coronary artery without angina pectoris: Secondary | ICD-10-CM | POA: Diagnosis not present

## 2023-07-12 DIAGNOSIS — N189 Chronic kidney disease, unspecified: Secondary | ICD-10-CM | POA: Diagnosis not present

## 2023-07-12 DIAGNOSIS — E1140 Type 2 diabetes mellitus with diabetic neuropathy, unspecified: Secondary | ICD-10-CM | POA: Diagnosis not present

## 2023-07-12 DIAGNOSIS — K219 Gastro-esophageal reflux disease without esophagitis: Secondary | ICD-10-CM | POA: Diagnosis not present

## 2023-07-12 DIAGNOSIS — G40219 Localization-related (focal) (partial) symptomatic epilepsy and epileptic syndromes with complex partial seizures, intractable, without status epilepticus: Secondary | ICD-10-CM | POA: Diagnosis not present

## 2023-07-12 DIAGNOSIS — M5459 Other low back pain: Secondary | ICD-10-CM | POA: Diagnosis not present

## 2023-07-12 DIAGNOSIS — G629 Polyneuropathy, unspecified: Secondary | ICD-10-CM | POA: Diagnosis not present

## 2023-07-12 DIAGNOSIS — M6281 Muscle weakness (generalized): Secondary | ICD-10-CM | POA: Diagnosis not present

## 2023-07-12 DIAGNOSIS — T8463XS Infection and inflammatory reaction due to internal fixation device of spine, sequela: Secondary | ICD-10-CM | POA: Diagnosis not present

## 2023-07-12 DIAGNOSIS — A4901 Methicillin susceptible Staphylococcus aureus infection, unspecified site: Secondary | ICD-10-CM | POA: Diagnosis not present

## 2023-07-12 DIAGNOSIS — E039 Hypothyroidism, unspecified: Secondary | ICD-10-CM | POA: Diagnosis not present

## 2023-07-12 DIAGNOSIS — R569 Unspecified convulsions: Secondary | ICD-10-CM | POA: Diagnosis not present

## 2023-07-12 DIAGNOSIS — G894 Chronic pain syndrome: Secondary | ICD-10-CM | POA: Diagnosis not present

## 2023-07-14 DIAGNOSIS — K219 Gastro-esophageal reflux disease without esophagitis: Secondary | ICD-10-CM | POA: Diagnosis not present

## 2023-07-14 DIAGNOSIS — G629 Polyneuropathy, unspecified: Secondary | ICD-10-CM | POA: Diagnosis not present

## 2023-07-14 DIAGNOSIS — N189 Chronic kidney disease, unspecified: Secondary | ICD-10-CM | POA: Diagnosis not present

## 2023-07-14 DIAGNOSIS — M5440 Lumbago with sciatica, unspecified side: Secondary | ICD-10-CM | POA: Diagnosis not present

## 2023-07-14 DIAGNOSIS — E1140 Type 2 diabetes mellitus with diabetic neuropathy, unspecified: Secondary | ICD-10-CM | POA: Diagnosis not present

## 2023-07-14 DIAGNOSIS — E039 Hypothyroidism, unspecified: Secondary | ICD-10-CM | POA: Diagnosis not present

## 2023-07-14 DIAGNOSIS — Z79899 Other long term (current) drug therapy: Secondary | ICD-10-CM | POA: Diagnosis not present

## 2023-07-14 DIAGNOSIS — G8929 Other chronic pain: Secondary | ICD-10-CM | POA: Diagnosis not present

## 2023-07-14 DIAGNOSIS — I10 Essential (primary) hypertension: Secondary | ICD-10-CM | POA: Diagnosis not present

## 2023-07-14 DIAGNOSIS — G2581 Restless legs syndrome: Secondary | ICD-10-CM | POA: Diagnosis not present

## 2023-07-14 DIAGNOSIS — E785 Hyperlipidemia, unspecified: Secondary | ICD-10-CM | POA: Diagnosis not present

## 2023-07-14 DIAGNOSIS — L98422 Non-pressure chronic ulcer of back with fat layer exposed: Secondary | ICD-10-CM | POA: Diagnosis not present

## 2023-07-15 DIAGNOSIS — A4901 Methicillin susceptible Staphylococcus aureus infection, unspecified site: Secondary | ICD-10-CM | POA: Diagnosis not present

## 2023-07-15 DIAGNOSIS — I10 Essential (primary) hypertension: Secondary | ICD-10-CM | POA: Diagnosis not present

## 2023-07-15 DIAGNOSIS — G2581 Restless legs syndrome: Secondary | ICD-10-CM | POA: Diagnosis not present

## 2023-07-15 DIAGNOSIS — E890 Postprocedural hypothyroidism: Secondary | ICD-10-CM | POA: Diagnosis not present

## 2023-07-15 DIAGNOSIS — G894 Chronic pain syndrome: Secondary | ICD-10-CM | POA: Diagnosis not present

## 2023-07-15 DIAGNOSIS — I4891 Unspecified atrial fibrillation: Secondary | ICD-10-CM | POA: Diagnosis not present

## 2023-07-15 DIAGNOSIS — M4647 Discitis, unspecified, lumbosacral region: Secondary | ICD-10-CM | POA: Diagnosis not present

## 2023-07-15 DIAGNOSIS — I25118 Atherosclerotic heart disease of native coronary artery with other forms of angina pectoris: Secondary | ICD-10-CM | POA: Diagnosis not present

## 2023-07-16 DIAGNOSIS — Z79899 Other long term (current) drug therapy: Secondary | ICD-10-CM | POA: Diagnosis not present

## 2023-07-16 DIAGNOSIS — G894 Chronic pain syndrome: Secondary | ICD-10-CM | POA: Diagnosis not present

## 2023-07-16 DIAGNOSIS — M5440 Lumbago with sciatica, unspecified side: Secondary | ICD-10-CM | POA: Diagnosis not present

## 2023-07-16 DIAGNOSIS — I10 Essential (primary) hypertension: Secondary | ICD-10-CM | POA: Diagnosis not present

## 2023-07-16 DIAGNOSIS — F909 Attention-deficit hyperactivity disorder, unspecified type: Secondary | ICD-10-CM | POA: Diagnosis not present

## 2023-07-16 DIAGNOSIS — R569 Unspecified convulsions: Secondary | ICD-10-CM | POA: Diagnosis not present

## 2023-07-16 DIAGNOSIS — F331 Major depressive disorder, recurrent, moderate: Secondary | ICD-10-CM | POA: Diagnosis not present

## 2023-07-16 DIAGNOSIS — K219 Gastro-esophageal reflux disease without esophagitis: Secondary | ICD-10-CM | POA: Diagnosis not present

## 2023-07-22 DEATH — deceased
